# Patient Record
Sex: Male | Born: 1945 | Race: White | Hispanic: No | Marital: Married | Smoking: Never smoker
Health system: Southern US, Community
[De-identification: ages and names within clinical notes are randomized; demographics above are authoritative.]

## PROBLEM LIST (undated history)

## (undated) DIAGNOSIS — M47816 Spondylosis without myelopathy or radiculopathy, lumbar region: Secondary | ICD-10-CM

## (undated) DIAGNOSIS — R7303 Prediabetes: Secondary | ICD-10-CM

## (undated) DIAGNOSIS — R112 Nausea with vomiting, unspecified: Secondary | ICD-10-CM

## (undated) DIAGNOSIS — M1990 Unspecified osteoarthritis, unspecified site: Secondary | ICD-10-CM

## (undated) DIAGNOSIS — C801 Malignant (primary) neoplasm, unspecified: Secondary | ICD-10-CM

## (undated) DIAGNOSIS — R011 Cardiac murmur, unspecified: Secondary | ICD-10-CM

## (undated) DIAGNOSIS — G459 Transient cerebral ischemic attack, unspecified: Secondary | ICD-10-CM

## (undated) DIAGNOSIS — Z87442 Personal history of urinary calculi: Secondary | ICD-10-CM

## (undated) DIAGNOSIS — I2510 Atherosclerotic heart disease of native coronary artery without angina pectoris: Secondary | ICD-10-CM

## (undated) DIAGNOSIS — I639 Cerebral infarction, unspecified: Secondary | ICD-10-CM

## (undated) DIAGNOSIS — M797 Fibromyalgia: Secondary | ICD-10-CM

## (undated) DIAGNOSIS — G4730 Sleep apnea, unspecified: Secondary | ICD-10-CM

## (undated) DIAGNOSIS — G709 Myoneural disorder, unspecified: Secondary | ICD-10-CM

## (undated) DIAGNOSIS — E039 Hypothyroidism, unspecified: Secondary | ICD-10-CM

## (undated) DIAGNOSIS — J189 Pneumonia, unspecified organism: Secondary | ICD-10-CM

## (undated) DIAGNOSIS — E079 Disorder of thyroid, unspecified: Secondary | ICD-10-CM

## (undated) DIAGNOSIS — E119 Type 2 diabetes mellitus without complications: Secondary | ICD-10-CM

## (undated) DIAGNOSIS — C61 Malignant neoplasm of prostate: Secondary | ICD-10-CM

## (undated) DIAGNOSIS — K859 Acute pancreatitis without necrosis or infection, unspecified: Secondary | ICD-10-CM

## (undated) DIAGNOSIS — J45909 Unspecified asthma, uncomplicated: Secondary | ICD-10-CM

## (undated) DIAGNOSIS — G4700 Insomnia, unspecified: Secondary | ICD-10-CM

## (undated) DIAGNOSIS — D649 Anemia, unspecified: Secondary | ICD-10-CM

## (undated) DIAGNOSIS — Z9889 Other specified postprocedural states: Secondary | ICD-10-CM

## (undated) DIAGNOSIS — K219 Gastro-esophageal reflux disease without esophagitis: Secondary | ICD-10-CM

## (undated) DIAGNOSIS — G43019 Migraine without aura, intractable, without status migrainosus: Secondary | ICD-10-CM

## (undated) DIAGNOSIS — I10 Essential (primary) hypertension: Secondary | ICD-10-CM

## (undated) HISTORY — DX: Spondylosis without myelopathy or radiculopathy, lumbar region: M47.816

## (undated) HISTORY — PX: MOHS SURGERY: SUR867

## (undated) HISTORY — PX: JOINT REPLACEMENT: SHX530

## (undated) HISTORY — DX: Migraine without aura, intractable, without status migrainosus: G43.019

## (undated) HISTORY — PX: FOOT SURGERY: SHX648

## (undated) HISTORY — PX: BACK SURGERY: SHX140

## (undated) HISTORY — PX: CHOLECYSTECTOMY: SHX55

## (undated) HISTORY — DX: Hypothyroidism, unspecified: E03.9

## (undated) HISTORY — DX: Cerebral infarction, unspecified: I63.9

## (undated) HISTORY — PX: OTHER SURGICAL HISTORY: SHX169

## (undated) HISTORY — DX: Insomnia, unspecified: G47.00

---

## 1990-09-26 HISTORY — PX: CHOLECYSTECTOMY: SHX55

## 1997-09-26 HISTORY — PX: FOOT SURGERY: SHX648

## 1999-03-15 ENCOUNTER — Ambulatory Visit (HOSPITAL_COMMUNITY): Admission: RE | Admit: 1999-03-15 | Discharge: 1999-03-15 | Payer: Self-pay | Admitting: Gastroenterology

## 2001-09-25 ENCOUNTER — Ambulatory Visit (HOSPITAL_COMMUNITY): Admission: RE | Admit: 2001-09-25 | Discharge: 2001-09-25 | Payer: Self-pay | Admitting: Family Medicine

## 2001-09-25 ENCOUNTER — Encounter: Payer: Self-pay | Admitting: Family Medicine

## 2003-01-20 ENCOUNTER — Ambulatory Visit (HOSPITAL_COMMUNITY): Admission: RE | Admit: 2003-01-20 | Discharge: 2003-01-20 | Payer: Self-pay | Admitting: Family Medicine

## 2003-01-20 ENCOUNTER — Encounter: Payer: Self-pay | Admitting: Family Medicine

## 2003-01-29 ENCOUNTER — Encounter: Payer: Self-pay | Admitting: Specialist

## 2003-01-31 ENCOUNTER — Inpatient Hospital Stay (HOSPITAL_COMMUNITY)
Admission: RE | Admit: 2003-01-31 | Discharge: 2003-02-05 | Payer: Self-pay | Admitting: Physical Medicine & Rehabilitation

## 2003-02-03 ENCOUNTER — Encounter: Payer: Self-pay | Admitting: Specialist

## 2003-02-18 ENCOUNTER — Encounter: Admission: RE | Admit: 2003-02-18 | Discharge: 2003-03-14 | Payer: Self-pay | Admitting: Specialist

## 2003-07-01 ENCOUNTER — Ambulatory Visit (HOSPITAL_COMMUNITY): Admission: RE | Admit: 2003-07-01 | Discharge: 2003-07-01 | Payer: Self-pay | Admitting: Family Medicine

## 2003-09-27 HISTORY — PX: TOTAL KNEE ARTHROPLASTY: SHX125

## 2004-09-03 ENCOUNTER — Ambulatory Visit (HOSPITAL_COMMUNITY): Admission: RE | Admit: 2004-09-03 | Discharge: 2004-09-03 | Payer: Self-pay | Admitting: Gastroenterology

## 2005-08-08 ENCOUNTER — Encounter: Admission: RE | Admit: 2005-08-08 | Discharge: 2005-08-08 | Payer: Self-pay | Admitting: Rheumatology

## 2005-10-24 ENCOUNTER — Encounter
Admission: RE | Admit: 2005-10-24 | Discharge: 2005-11-24 | Payer: Self-pay | Admitting: Physical Medicine and Rehabilitation

## 2006-01-26 DIAGNOSIS — D229 Melanocytic nevi, unspecified: Secondary | ICD-10-CM

## 2006-01-26 HISTORY — DX: Melanocytic nevi, unspecified: D22.9

## 2006-09-26 HISTORY — PX: PROSTATECTOMY: SHX69

## 2007-08-02 ENCOUNTER — Encounter (INDEPENDENT_AMBULATORY_CARE_PROVIDER_SITE_OTHER): Payer: Self-pay | Admitting: Urology

## 2007-08-02 ENCOUNTER — Inpatient Hospital Stay (HOSPITAL_COMMUNITY): Admission: RE | Admit: 2007-08-02 | Discharge: 2007-08-03 | Payer: Self-pay | Admitting: Urology

## 2010-11-06 ENCOUNTER — Emergency Department (HOSPITAL_COMMUNITY): Payer: BC Managed Care – PPO

## 2010-11-06 ENCOUNTER — Observation Stay (HOSPITAL_COMMUNITY)
Admission: EM | Admit: 2010-11-06 | Discharge: 2010-11-07 | Disposition: A | Discharge status: Home / Self Care | Payer: BC Managed Care – PPO | Attending: Neurology | Admitting: Neurology

## 2010-11-06 DIAGNOSIS — I6509 Occlusion and stenosis of unspecified vertebral artery: Secondary | ICD-10-CM | POA: Insufficient documentation

## 2010-11-06 DIAGNOSIS — G4733 Obstructive sleep apnea (adult) (pediatric): Secondary | ICD-10-CM | POA: Insufficient documentation

## 2010-11-06 DIAGNOSIS — E039 Hypothyroidism, unspecified: Secondary | ICD-10-CM | POA: Insufficient documentation

## 2010-11-06 DIAGNOSIS — R4182 Altered mental status, unspecified: Principal | ICD-10-CM | POA: Insufficient documentation

## 2010-11-06 DIAGNOSIS — R269 Unspecified abnormalities of gait and mobility: Secondary | ICD-10-CM | POA: Insufficient documentation

## 2010-11-06 DIAGNOSIS — I10 Essential (primary) hypertension: Secondary | ICD-10-CM | POA: Insufficient documentation

## 2010-11-06 DIAGNOSIS — K219 Gastro-esophageal reflux disease without esophagitis: Secondary | ICD-10-CM | POA: Insufficient documentation

## 2010-11-06 DIAGNOSIS — IMO0001 Reserved for inherently not codable concepts without codable children: Secondary | ICD-10-CM | POA: Insufficient documentation

## 2010-11-06 DIAGNOSIS — E785 Hyperlipidemia, unspecified: Secondary | ICD-10-CM | POA: Insufficient documentation

## 2010-11-06 DIAGNOSIS — R4789 Other speech disturbances: Secondary | ICD-10-CM | POA: Insufficient documentation

## 2010-11-06 DIAGNOSIS — R29810 Facial weakness: Secondary | ICD-10-CM | POA: Insufficient documentation

## 2010-11-06 DIAGNOSIS — G43909 Migraine, unspecified, not intractable, without status migrainosus: Secondary | ICD-10-CM | POA: Insufficient documentation

## 2010-11-06 LAB — CBC
HCT: 44.6 % (ref 39.0–52.0)
HCT: 45.5 % (ref 39.0–52.0)
Hemoglobin: 16.1 g/dL (ref 13.0–17.0)
Hemoglobin: 16.6 g/dL (ref 13.0–17.0)
MCH: 32.1 pg (ref 26.0–34.0)
MCH: 32.4 pg (ref 26.0–34.0)
MCV: 89 fL (ref 78.0–100.0)
MCV: 88.7 fL (ref 78.0–100.0)
Platelets: 214 10*3/uL (ref 150–400)
RBC: 5.01 MIL/uL (ref 4.22–5.81)
RBC: 5.13 MIL/uL (ref 4.22–5.81)
WBC: 8 10*3/uL (ref 4.0–10.5)

## 2010-11-06 LAB — URINALYSIS, ROUTINE W REFLEX MICROSCOPIC
Bilirubin Urine: NEGATIVE
Hgb urine dipstick: NEGATIVE
Specific Gravity, Urine: 1.005 (ref 1.005–1.030)
Urine Glucose, Fasting: NEGATIVE mg/dL
Urobilinogen, UA: 0.2 mg/dL (ref 0.0–1.0)

## 2010-11-06 LAB — COMPREHENSIVE METABOLIC PANEL
ALT: 69 U/L — ABNORMAL HIGH (ref 0–53)
AST: 43 U/L — ABNORMAL HIGH (ref 0–37)
Albumin: 4 g/dL (ref 3.5–5.2)
CO2: 31 mEq/L (ref 19–32)
Chloride: 104 mEq/L (ref 96–112)
GFR calc Af Amer: >60 mL/min (ref >60)
GFR calc non Af Amer: >60 mL/min (ref >60)
Sodium: 143 mEq/L (ref 135–145)
Total Bilirubin: 1 mg/dL (ref 0.3–1.2)

## 2010-11-06 LAB — DIFFERENTIAL
Eosinophils Absolute: 0.2 10*3/uL (ref 0.0–0.7)
Lymphocytes Relative: 40 % (ref 12–46)
Lymphs Abs: 2.9 10*3/uL (ref 0.7–4.0)
Monocytes Relative: 6 % (ref 3–12)
Neutrophils Relative %: 51 % (ref 43–77)

## 2010-11-06 LAB — BASIC METABOLIC PANEL
BUN: 13 mg/dL (ref 6–23)
CO2: 30 mEq/L (ref 19–32)
Chloride: 104 mEq/L (ref 96–112)
Creatinine, Ser: 1.07 mg/dL (ref 0.4–1.5)
Glucose, Bld: 117 mg/dL — ABNORMAL HIGH (ref 70–99)

## 2010-11-06 LAB — HEMOGLOBIN A1C
Hgb A1c MFr Bld: 5.8 % — ABNORMAL HIGH (ref <5.7)
Mean Plasma Glucose: 120 mg/dL — ABNORMAL HIGH (ref <117)

## 2010-11-06 LAB — CK TOTAL AND CKMB (NOT AT ARMC): CK, MB: 1 ng/mL (ref 0.3–4.0)

## 2010-11-06 MED ORDER — GADOBENATE DIMEGLUMINE 529 MG/ML IV SOLN
18.0000 mL | Freq: Once | INTRAVENOUS | Status: AC
  Administered 2010-11-06: 18 mL via INTRAVENOUS

## 2010-11-07 LAB — LIPID PANEL
HDL: 29 mg/dL — ABNORMAL LOW (ref >39)
LDL Cholesterol: 141 mg/dL — ABNORMAL HIGH (ref 0–99)
Triglycerides: 251 mg/dL — ABNORMAL HIGH (ref <150)

## 2010-11-09 NOTE — Discharge Summary (Signed)
Joel Kim                ACCOUNT NO.:  [id number]  MEDICAL RECORD NO.:  [id number]           PATIENT TYPE:  I  LOCATION:  3039                         FACILITY:  [facility]  PHYSICIAN:  Marlan Palau, M.D.  DATE OF BIRTH:  Apr 10, 1946  DATE OF ADMISSION:  11/06/2010 DATE OF DISCHARGE:                              DISCHARGE SUMMARY   ADMISSION DIAGNOSES: 1. New onset of confusion, slurred speech, gait ataxia, rule out     transient ischemic attack. 2. History of frequent headaches. 3. Obstructive sleep apnea on continuous positive airway pressure. 4. Hypertension.  DISCHARGE DIAGNOSES: 1. Possible transient ischemic attack versus migraine event. 2. History of headache. 3. Obstructive sleep apnea. 4. Hypertension. 5. Fibromyalgia. 6. Dyslipidemia.  PROCEDURE DURING THIS ADMISSION: 1. CT scan of the head. 2. MRI of the brain. 3. MR angiogram of the head. 4. MRI angiogram of the neck.  COMPLICATIONS ABOVE PROCEDURES:  None.  HISTORY OF PRESENT ILLNESS:  Joel Kim is a 65 year old right-handed white male born Oct 25, 1945, with a history of headaches followed through [facility].  The patient has had some significant problems with obstructive sleep apneas on CPAP.  The patient had been noting some increase in headache over the last week or two prior to admission claiming that the headaches are mainly on the right side and go down the right neck.  The patient had an event on the morning of admission.  The patient was noted by his wife to have some clumsiness, had difficulty pouring his coffee, slurred speech, right facial droop, gait instability.  This lasted about 20 minutes, then seemed to resolve.  The patient was taken to the emergency room and a CT scan of the brain was done and it was unremarkable.  ABCD squared score was 4.  The patient was admitted for observation and workup for possible TIA event.  PAST MEDICAL HISTORY: 1.  Possible TIA. 2. Obstructive sleep apnea on CPAP. 3. Vertigo. 4. History of headache. 5. Hypertension. 6. Fibromyalgia. 7. Gallbladder resection. 8. 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 9. Left foot surgery. 10.Right knee surgery. 11.Depression. 12.Dyslipidemia. 13.Gastroesophageal reflux disease. 14.Obesity.  MEDICATIONS PRIOR TO ADMISSION:  AndroGel daily application, aspirin 325 mg daily, Zyrtec 10 mg daily, gabapentin 300 mg twice daily, levothyroxine 25 mcg daily, Lovaza 2 capsules twice daily, Micardis 80/25 mg tablet one daily, multivitamins one daily, Nexium 40 mg daily, nortriptyline 50 mg at night, oxycodone 5 mg 1 tablet if needed every 6 hours, OxyContin 20 mg three times daily, potassium 10 mEq daily, Tekturna 300 mg daily, vitamin D 1 tablet daily, Ambien 1 tablet at bedtime if needed for sleep.  ALLERGIES:  The patient has allergies to TAPE ADHESIVES, STATIN DRUGS, which causes muscle and joint pain, PAXIL causes abnormal behavior, and WELLBUTRIN causes abnormal behavior.  SOCIAL HISTORY:  The patient does not currently smoke or drink.  Please refer to history and physical dictation summary for social history, family history, review of systems, physical examination.  Laboratory values during this admission include white count of 8.0, hemoglobin of 16.6, hematocrit of 45.5, MCV of 88.7,  platelets of 219. Sodium 143, potassium 4.3, chloride of 104, CO2 of 31, glucose of 115, BUN of 14, creatinine 1.12, alkaline phosphatase of 93, SGOT of 43, SGPT of 69, total protein 7.4, albumin of 4.0, calcium 10.2, hemoglobin A1c 5.8, CK of 62, troponin-I 0.01.  Urinalysis reveals specific gravity of 1.005, pH of 8.0, cholesterol is 220, triglycerides of 251, HDL of 29, LDL of 141, VLDL of 50.  HOSPITAL COURSE:  The patient was admitted to the hospital for evaluation of a possible TIA event with gait ataxia, slurred speech, right facial droop.  The patient underwent CT  scan of the brain initially that was unremarkable.  The patient was set up for an MRI of the brain and MRA of the head and neck.  This was done.  MRI of the brain shows no acute stroke with mild small vessel chronic changes seen. MRA of the head shows some intracranial atherosclerotic changes involving branch vessels and MRA of the neck was relatively unremarkable.  The patient was kept on aspirin during this admission. Cholesterol panel indicated an elevation in LDL but the patient is not tolerance of the statin drugs.  At this time, the patient is to be discharged to home and will have an outpatient 2-D echocardiogram and an EEG study.  EEG will be done because the patient himself does not recall events that occurred around the time of the episode.  It is possible that this may be related to headache.  The patient will be placed on Toprol XL 50 mg 1 tablet daily as the patient continues to have elevations in blood pressure with diastolic blood pressures in the 90- 100 range and elevations in heart rate that may be related to the use of the nortriptyline.  Heart rates run around the 100 range.  The discharge medications, otherwise, be unchanged and will be as listed previously.  The patient will follow up through [facility] within 4 to 6 weeks but again the outpatient studies will be arranged.  At the time of discharge, the patient is bright, alert, cooperative, at baseline mental status.  No weakness, facial asymmetry, some slurred speech is noted.     Marlan Palau, M.D.     CKW/MEDQ  D:  11/07/2010  T:  11/08/2010  Job:  [id number]  cc:   Ernestina Penna, M.D. [facility]  Electronically Signed by Thana Farr M.D. on 11/09/2010 09:59:36 AM

## 2010-11-09 NOTE — H&P (Signed)
NAMEPESACH, FRISCH NO.:  [id number]  MEDICAL RECORD NO.:  [id number]           PATIENT TYPE:  I  LOCATION:  3039                         FACILITY:  [facility]  PHYSICIAN:  Marlan Palau, M.D.  DATE OF BIRTH:  10-30-1945  DATE OF ADMISSION:  11/06/2010 DATE OF DISCHARGE:                             HISTORY & PHYSICAL   HISTORY OF PRESENT ILLNESS:  Joel Kim is a 65 year old right-handed white male, born on 1946/03/28, with a history of headaches followed through [facility].  The patient has obstructive sleep apnea.  He is on CPAP.  The patient had an event this morning that was associated with an event of clumsiness, had difficulty pouring his coffee, slurred speech, right facial droop, gait instability.  This has been lasted about 20 minutes and then resolved. The patient is back to baseline, but came to the emergency room for evaluation.  CT scan of the brain was done and was unremarkable.  The patient has an ABCD squared score of 4.  He is being admitted for observation and rapid workup for a probable TIA event.  PAST MEDICAL HISTORY:  Significant for: 1. Possible TIA. 2. Obstructive sleep apnea, on CPAP. 3. Vertigo. 4. History of headache.  The patient noted increase in right     frontotemporal headache 1 week prior to coming in the hospital. 5. Hypertension. 6. Fibromyalgia. 7. Gallbladder resection. 8. Cancer of the prostate, status post TURP. 9. Left foot surgery. 10.Right knee surgery. 11.Depression. 12.Dyslipidemia. 13.Gastroesophageal reflux disease. 14.Obesity.  MEDICATIONS:  Prior to coming in include: 1. Micardis 80/25 mg tablet 1 tablet daily. 2. Ambien 10 mg daily. 3. Zyrtec 10 mg daily. 4. Potassium 10 mEq daily. 5. OxyContin 10 mg 3 times daily with 20 mg of OxyContin 3 times     daily. 6. Nexium 40 mg daily. 7. Enteric-coated aspirin 81 mg daily. 8. Fish oil 1000 units 4 times daily. 9. Vitamin D  1000 five times daily. 10.Multivitamins one daily. 11.AndroGel daily. 12.Synthroid 0.025 mg daily. 13.Pamelor 50 mg at night. 14.Lasix 20 mg daily. 15.Neurontin 300 mg twice daily. 16.Tekturna 300 mg daily.  The patient has no known allergies.  He does not currently smoke or drink.  SOCIAL HISTORY:  This patient is married.  Lives in the [place] area.  He is retired.  The patient has four children who are alive and well.  FAMILY MEDICAL HISTORY:  The mother died of old age.  She also had dementia and heart attack.  Father died with myocardial infarction.  The patient has three brothers who are alive and well.  No sisters.  REVIEW OF SYSTEMS:  Notable for some feeling of being cold.  No fevers. The patient does have some troubles with headaches on and off, but has had a right frontotemporal headaches and some right neck discomfort for 1-2 weeks prior to coming in.  The patient has had no shortness of breath, chest pains, abdominal pain, nausea, or vomiting.  Denies problems controlling the bowels or bladder.  The patient has not had any actual syncope or seizure events.  PHYSICAL EXAMINATION:  VITALS:  Blood pressure is 117/87, heart rate 88, respiratory rate 16, temperature afebrile. GENERAL:  The patient is a minimally to moderately obese white male who is alert, cooperative at the time examination. HEENT:  Head is atraumatic.  Eyes:  Pupils are equal, round, and react to light. NECK:  Supple.  No carotid bruits noted. RESPIRATORY:  Clear. CARDIOVASCULAR:  Regular rate and rhythm.  No obvious murmurs or rubs noted. EXTREMITIES:  Without significant edema, but trace edema at ankles is noted. NEUROLOGIC:  Cranial nerves as above.  Facial symmetry is present.  The patient has good sensation of the face to pinprick and soft touch bilaterally.  He has good strength of facial muscles and muscle of the head turn and shoulder shrug bilaterally.  Speech is well  enunciated, not aphasic or dysarthric.  Motor test reveals 5/5 strength in all fours.  Good symmetric motor tone is noted throughout.  No drift is seen in arms or legs.  The patient has good finger-nose-finger, heel-to-shin. Gait was not tested.  Pinprick sensation reveals a decrease in pinprick on the left legs.  In the right, symmetric in the arms.  Vibratory sensation is symmetric throughout.  Deep tendon reflexes are depressed but symmetric.  LABORATORY DATA:  Laboratory values notable for white count of 7.2, hemoglobin is 16.1, hematocrit 44.6, MCV of 89.0, platelets of 214. Sodium 142, potassium of 4.2, chloride of 104, CO2 of 30, glucose 117, BUN of 13, creatinine 1.07.  Calcium 9.8.  INR of 0.98.  CT of the head was unremarkable.  IMPRESSION: 1. Possible transient ischemic attack event. 2. Hypertension. 3. Obstructive sleep apnea, on CPAP.  This patient will be admitted for brief evaluation of possible TIA event.  The patient will undergo an MRI of the brain and MRA of the head and neck.  The 2-D echocardiogram will be ordered.  Given the fact that the patient did not recall the event, may consider an EEG study, which could be done as an outpatient.  The patient will be maintained on aspirin at this time.  I will follow this patient while in-house.     Marlan Palau, M.D.     CKW/MEDQ  D:  11/06/2010  T:  11/07/2010  Job:  [id number]  cc:   [facility] Ernestina Penna, M.D.  Electronically Signed by Thana Farr M.D. on 11/09/2010 09:59:28 AM

## 2010-11-11 ENCOUNTER — Encounter: Payer: Self-pay | Admitting: Cardiology

## 2010-11-11 ENCOUNTER — Ambulatory Visit (HOSPITAL_COMMUNITY): Payer: BC Managed Care – PPO | Attending: Internal Medicine

## 2010-11-11 DIAGNOSIS — I059 Rheumatic mitral valve disease, unspecified: Secondary | ICD-10-CM | POA: Insufficient documentation

## 2010-11-11 DIAGNOSIS — E785 Hyperlipidemia, unspecified: Secondary | ICD-10-CM | POA: Insufficient documentation

## 2010-11-11 DIAGNOSIS — G459 Transient cerebral ischemic attack, unspecified: Secondary | ICD-10-CM | POA: Insufficient documentation

## 2010-11-11 DIAGNOSIS — I10 Essential (primary) hypertension: Secondary | ICD-10-CM | POA: Insufficient documentation

## 2010-11-11 DIAGNOSIS — I079 Rheumatic tricuspid valve disease, unspecified: Secondary | ICD-10-CM | POA: Insufficient documentation

## 2011-02-08 NOTE — H&P (Signed)
NAMEKARLON, SCHLAFER NO.:  [id number]   MEDICAL RECORD NO.:  [id number]          PATIENT TYPE:  INP   LOCATION:  NA                           FACILITY:  [facility]   PHYSICIAN:  Heloise Purpura, MD      DATE OF BIRTH:  05/07/1946   DATE OF ADMISSION:  DATE OF DISCHARGE:                              HISTORY & PHYSICAL   CHIEF COMPLAINT:  Prostate cancer.   HISTORY:  Trelon is a 65 year old gentleman with clinical stage T1C  prostate cancer with a PSA of 4.6 and Gleason score of 3 +4 = 7.  His  biopsy was performed by Dr. Ian Bushman on April 16, 2007 demonstrating 70% of  the left side of the prostate to be involved with adenocarcinoma.  He  underwent a CT scan and bone scan under the care of Dr. Ian Bushman which were  both negative for metastatic disease.  After discussion regarding  management options and taking into consideration his history of  hypogonadism and his desire to resume testosterone placement therapy,  the patient elected to proceed with surgical therapy.   PAST MEDICAL HISTORY:  1. Hyperlipidemia.  2. Hypertension.  3. History of arthritis.  4. Bilateral lower extremity neuropathy.  5. History of nephrolithiasis.  6. Asthma.  7. Mild depression.  8. Gastroesophageal reflux disease.   PAST SURGICAL HISTORY:  1. Total knee arthroplasty.  2. Cholecystectomy.  3. Foot surgery.   MEDICATIONS:  1. Micardis.  2. Potassium chloride.  3. Gabapentin.  4. Furosemide.  5. OxyContin.  6. Oxycodone.  7. Zyrtec.  8. Omega 3 fish oil.  9. Vitamin C.  10.Flaxseed oil.  11.Aspirin.  12.Ambien.   ALLERGIES:  NO KNOWN DRUG ALLERGIES.   FAMILY HISTORY:  There is no history of prostate cancer or GU  malignancy.  The patient's father and mother have a history of  hypertension and history of myocardial infarction.  The patient's mother  also has a history of high cholesterol.  The patient's father died at  age 65 secondary to a myocardial infarction.   SOCIAL  HISTORY:  The patient is married and has two children.  He is  disabled due to his neuropathy.  He denies tobacco use.  He drinks  alcohol only occasionally.   REVIEW OF SYSTEMS:  A complete review of systems was performed.  Pertinent positives include a history of night sweats, fatigue, itching,  blurred vision, sinus problems, back pain, indigestion, headaches and  mild depression.   PHYSICAL EXAMINATION:  CONSTITUTIONAL:  Well-nourished, well-developed,  age-appropriate male in no acute distress.  CARDIOVASCULAR:  Regular rate and rhythm without obvious murmurs.  LUNGS:  Clear bilaterally.  ABDOMEN:  Soft and nontender.  DIGITAL RECTAL EXAM:  Normal sphincter tone without rectal masses.  The  prostate is smooth without nodularity or induration.   IMPRESSION:  Clinically localized adenocarcinoma of the prostate.   PLAN:  Jashon will undergo a robotic-assisted laparoscopic radical  prostatectomy and bilateral pelvic lymphadenectomy.  He will then be  admitted to the hospital for routine postoperative care.      Heloise Purpura, MD  Electronically Signed     LB/MEDQ  D:  08/02/2007  T:  08/02/2007  Job:  [id number]

## 2011-02-08 NOTE — Op Note (Signed)
NAMECORNIE, HERRINGTON NO.:  [id number]   MEDICAL RECORD NO.:  [id number]          PATIENT TYPE:  INP   LOCATION:  0005                         FACILITY:  [facility]   PHYSICIAN:  Heloise Purpura, MD      DATE OF BIRTH:  04/30/1946   DATE OF PROCEDURE:  08/02/2007  DATE OF DISCHARGE:                               OPERATIVE REPORT   PREOPERATIVE DIAGNOSIS:  Clinically localized adenocarcinoma of  prostate.   POSTOPERATIVE DIAGNOSIS:  Clinically localized adenocarcinoma of  prostate.   PROCEDURES:  1. Robotic-assisted laparoscopic radical prostatectomy (bilateral      nerve-sparing).  2. Bilateral laparoscopic pelvic lymphadenectomy.   SURGEON:  Dr. Heloise Purpura.   ASSISTANT:  Dr. Duane Boston.   ANESTHESIA:  General.   COMPLICATIONS:  None.   ESTIMATED BLOOD LOSS:  200 mL.   SPECIMENS:  1. Prostate seminal vesicles.  2. Right pelvic lymph nodes.  3. Left pelvic lymph nodes.   DISPOSITION:  Specimens to pathology.   DRAINS:  1. A 20-French coude catheter.  2. A 19-Blake pelvic drain.   INDICATION FOR PROCEDURE:  Joshue is a 65 year old gentleman with  recently diagnosed clinical stage T1c prostate cancer with a PSA of 4.6  and Gleason score of 3 + 4 = 7.  After discussion regarding management  options for treatment, the patient elected to proceed with the above  procedures.  The potential risks, complications and alternative options  were discussed in detail with the patient and informed consent was  obtained.   DESCRIPTION OF PROCEDURE:  The patient was taken to the operating room  and general anesthetic was administered.  He was given preoperative  antibiotics and placed in the dorsal lithotomy position and prepped and  draped in the usual sterile fashion.  Next, a preoperative time-out was  performed.  A Foley catheter was inserted into the bladder.  A 22-French  catheter could not be placed into the bladder with ease, and therefore,  a 20-French  coude catheter was inserted and did pass without difficulty.  A site was selected just left of the umbilicus and a 12-mm port was  placed, utilizing a standard open Hassan technique which allowed entry  into the peritoneal cavity under direct vision.  Once the port was  placed, a pneumoperitoneum was established and a 0-degree lens was used  to inspect the abdomen.  There was no evidence of any intra-abdominal  injuries or other abnormalities.  The remaining ports were then placed.  Bilateral 8-mm robotic ports were placed approximately 10 cm lateral to  and just inferior to the camera port site.  An additional 8-mm port site  was placed in the far left lateral abdominal wall.  A 5-mm port was  placed between the camera port and the right robotic port, and an  additional left 12 mm port was placed in the far right lateral abdominal  wall for laparoscopic assistance.  All ports were placed under direct  vision without difficulty.  The surgical cart was then docked.  With the  aid of the cautery scissors, the bladder was reflected  posteriorly,  allowing entry into space of Retzius and identification of the  endopelvic fascia and prostate.  The endopelvic fascia was incised with  the apex back to the base of prostate bilaterally and the underlying  levator muscle fibers were swept laterally off the prostate.  This  isolated the dorsal venous complex which was then stapled and divided  with a 45-mm Flex-ETS stapler.  The bladder neck was identified with the  aid of Foley catheter manipulation and the anterior bladder neck was  divided, thereby exposing the catheter.  The catheter balloon was  deflated.  The catheter was brought into the operative field and used to  retract the prostate anteriorly.  The posterior bladder neck was then  divided and dissection continued between the bladder and prostate until  the vasa deferentia and seminal vesicles were identified.  The vasa  deferentia were  divided and lifted anteriorly.  The seminal vesicles  were then dissected down to their tips with care to control the seminal  vesicle arterial blood supply.  The seminal vesicles were then lifted  anteriorly in the space between Denonvilliers fascia and the anterior  rectum was bluntly developed.  This isolated the vascular pedicles of  the prostate.  The lateral prostatic fascia was then incised bilaterally  and the neurovascular bundles were swept posteriorly and laterally off  the prostate.  The vascular pedicles were then divided with Hem-o-lok  clips above the level of the neurovascular bundles with Hem-o-lok clips  and divided with sharp cold scissor dissection.  The neurovascular  bundles were then swept off the apex of prostate and the urethra was  sharply divided, allowing the specimen to be disarticulated.  The pelvis  was then copiously irrigated and hemostasis was ensured.  With  irrigation in the pelvis, air was injected into the rectal catheter and  there was no evidence of a rectal injury.  Attention was then turned to  the right pelvic sidewall.  The fibrofatty tissue between the external  iliac vein, confluence of the iliac vessels, hypogastric artery, and  Cooper's ligament was dissected free from the pelvic sidewall with care  to preserve the obturator nerve.  Hem-o-lok clips were used for  lymphostasis and hemostasis.  The specimen was then passed off for  permanent pathologic analysis and an identical procedure was then  performed on the contralateral side.  Attention was then turned to the  urethral anastomosis.  A 2-0 Vicryl slip-knot was placed between the  bladder neck and Denonvilliers fascia and the posterior urethral tissue  to reapproximate these structures.  A double-armed 3-0 Monocryl suture  was then used to perform a 360-degree, running, tension-free anastomosis  between the bladder neck and urethra.  A new 20-French coude catheter  was then inserted into  the bladder and irrigated.  There were no blood  clots within the bladder and the anastomosis appeared to be watertight.  A 19-Blake drain was brought through the left robotic port and  appropriately positioned in the pelvis.  It was secured to the skin with  a nylon suture.  The surgical cart was then undocked.  The right lateral  12-mm port site was closed with a 0-Vicryl suture placed with the aid of  the suture passer device.  All remaining ports were then removed under  direct vision.  The prostate was removed intact within the Endopouch  retrieval bag and the fascial opening at this site was closed with a  running 0-Vicryl suture.  All port  sites were injected with 0.25%  Marcaine and reapproximate at the skin level with staples.  Sterile  dressings were applied.  The patient appeared to tolerate the procedure  well without complications.  He was able to be extubated and transferred  to the recovery unit in satisfactory condition.      Heloise Purpura, MD  Electronically Signed     LB/MEDQ  D:  08/02/2007  T:  08/02/2007  Job:  [id number]

## 2011-02-11 NOTE — Op Note (Signed)
NAME:  Joel Kim, Joel Kim                          ACCOUNT NO.:  [id number]   MEDICAL RECORD NO.:  [id number]                   PATIENT TYPE:  INP   LOCATION:  5039                                 FACILITY:  [facility]   PHYSICIAN:  Erasmo Leventhal, M.D.         DATE OF BIRTH:  1945/12/10   DATE OF PROCEDURE:  01/31/2003  DATE OF DISCHARGE:                                 OPERATIVE REPORT   PREOPERATIVE DIAGNOSIS:  Right knee end-stage osteoarthritis.   POSTOPERATIVE DIAGNOSIS:  Right knee end-stage osteoarthritis.   PROCEDURE:  Right total knee arthroplasty.   SURGEON:  Erasmo Leventhal, M.D.   ASSISTANT:  Jaquelyn Bitter. Chabon, P.A.   ANESTHESIA:  General with femoral nerve block.   ESTIMATED BLOOD LOSS:  Less than 100 mL.  Drains; two Hemovac.   DISPOSITION:  To PACU stable.   IMPLANTS:  Osteonics components.  Size 7 femur, size 7 tibia, 12 mm flexed  tibial insert, posterior stabilized.  26 mm polyethylene patella.  All  components cemented.   DESCRIPTION OF PROCEDURE:  This patient was counseled in the holding area,  informed consent was signed and is in the chart, was taken to the operating  room and given general anesthesia.  IV Ancef was given 1 gram.  The right  knee was examined with 5 degrees of flexion contracture and flexed to 120  degrees.  Foley catheter was placed utilizing sterile technique by the OR  circulating nurse.  Right lower extremity was elevated, prepped with  Duraprep and Vi-draped in  a sterile fashion.  It is also of note that the  other extremities and parts of body were well padded during the procedure.   The right lower extremity was elevated and exsanguinated with Esmarch. The  tourniquet was inflated to 350 mmHg.   Straight midline incision was made through the skin and subcutaneous tissue  and small veins were coagulated.  Had a small medial scar from beforehand  and this was kept in mind.  Medialized soft tissue flaps were developed.  Medial peripatellar arthrotomy was performed. The patella was everted and  knee was flexed.  End-stage arthritic change bone against bone.  Cruciate  ligaments were resected.  Starting hole was made in the distal in the femur.  Canal was irrigated to efflux.  Medullary rod was gently placed.  After  venting the entire canal.  Made a 10 mm cut off the distal femur with 5  degrees valgus alignment. This was done.  The femur was found to be a size  7, rotational marks were made, and distal femur was cut to fit a size #7.  The lateral menisci were removed and vessels were coagulated.  Tibial  endplates were resected and proximal tibia was found to be a size #7.  Starting hole was made.  The intermedullary canal was irrigated until the  effluent was clear and intermedullary rod was gently placed after the  canal  had been vented.  I chose to take a 2 mm cut based upon the lateral side  which was a nondefect side, at a 5 degree posterior slope.  Alignments were  made. The proximal tibial was cut.  Posterior medial posterior femoral  osteophytes were noted under direct visualization. Femoral trochlea was  prepared in a standard fashion. At this point in time a size 7 femur, a size  7 tibia, and 10 mm insert with excellent range of motion and soft tissue  balance.  Alignment was excellent.  Rotational marks were made on the  proximal tibia and delta keel performed in a standard fashion. The patella  was found to be a size 26 and reamed to appropriate depth of 10 mm, locking  holes were made, and excess bone was removed.  The patella was plenty thick  for this 10 mm recession.  Utilizing pulsatile lavage, the knee was then  copiously irrigated.  Vessels were coagulated.  Utilizing moderate cement  technique, all components were cemented in place.  Size 7 tibia, size 7  femur, and 26 mm patella.  After cement had cured with a trial of 10 and 12  mm thickness, with a 10 mm thickness flexed posterior  stabilized tibial  insert, we had 2 to 3 degrees of reservatum, we could flex well passed 120  degrees.  Patella tracking was done and did not require collateral release.  The knee was well balanced in flexion and extension.  At this point in time,  the tibial trial was removed, excess cement was removed. All bone wax was  placed on exposed bony surfaces and the tibia appeared to be completely dry.  A 12 mm tibial insertion was placed.  At this point in time, the knee had  excellent range of motion and soft tissue balance in flexion and extension.  Patella femoral tracking was anatomic.  Bone wax was placed on exposed bony  surfaces.  Two medium Hemovac drains were placed.   Each layer was irrigated with antibiotic solution on closure.  Sequential  closure of layers were done.  Arthrotomy Vicryl, quadriceps tendon two  layers of Vicryl, subcu Vicryl, skin clips, and Monocryl suture.  Steri-  Strips were applied and sterile compressive dressings.  After confirming  anesthesia, another 30 mL of 0.25% Marcaine with epinephrine placed in the  knee joint to obtain hemostasis.  A sterile compressive dressing was applied  and tourniquet deflated.  Had normal circulation in the foot and ankle at  the end of the case.  At this time a femoral nerve block was given.  Also  after the tourniquet had been deflated, another gram of Ancef was given  intravenously.  Needle, sponge, and instrument count correct. There were no  complications.  The patient was taken to the recovery room in stable  condition.   Decreased surgical time and femoral contraction, decision made throughout  the entire case, Jaquelyn Bitter. Chabon, P.A.'s assistance was needed.                                               Erasmo Leventhal, M.D.    RAC/MEDQ  D:  01/31/2003  T:  02/03/2003  Job:  [id number]

## 2011-02-11 NOTE — Discharge Summary (Signed)
Joel Kim, Joel Kim NO.:  [id number]   MEDICAL RECORD NO.:  [id number]          PATIENT TYPE:  INP   LOCATION:  1442                         FACILITY:  [facility]   PHYSICIAN:  Heloise Purpura, MD      DATE OF BIRTH:  30-Aug-1946   DATE OF ADMISSION:  08/02/2007  DATE OF DISCHARGE:  08/03/2007                               DISCHARGE SUMMARY   ADMISSION DIAGNOSIS:  Prostate cancer.   DISCHARGE DIAGNOSIS:  Prostate cancer.   HISTORY AND PHYSICAL:  For full details, please see admission history  and physical.  Briefly, [name] is a 65 year old gentleman with clinically  localized prostate cancer.  After discussion regarding management  options, he elected to proceed with surgical therapy.   HOSPITAL COURSE:  On August 02, 2007, the patient was taken to the  operating room and underwent a robotic-assisted laparoscopic radical  prostatectomy and bilateral pelvic lymphadenectomy.  He tolerated this  procedure well and without complications.  Postoperatively, he was able  to be transferred to a regular hospital room following recovery from  anesthesia.  He was able to begin ambulating the night of surgery and  remained hemodynamically stable after surgery.  His hematocrit was  checked and found to be stable at 34.4, on postoperative day #1.  He  maintained excellent urine output with minimal output from his pelvic  drain, and his pelvic drain was removed on postoperative day #1.  He was  then begun on a clear liquid diet, which he tolerated without  difficulty.  He was able to be transitioned to oral pain medication and  was subsequently discharged home in excellent condition.   DISPOSITION:  Home.   DISCHARGE MEDICATIONS:  The patient was instructed to resume his regular  home medications excepting any aspirin, nonsteroidal anti-inflammatory  drugs, or herbal supplements.  He was given a prescription to take Cipro  1 day prior to his return visit.  As far as pain  management, due to his  chronic pain situation, he was told to take his p.r.n. oxycodone, as  needed for breakthrough pain.   DISCHARGE INSTRUCTIONS:  He was instructed to be ambulatory but  specifically told to refrain from any heavy lifting, strenuous activity,  or driving.  He was instructed on routine Foley catheter care and told  to gradually advance his diet over the course of the next few days.   FOLLOW UP:  The patient will follow-up in 1 week for review of his  surgical pathology and to remove his Foley catheter.      Heloise Purpura, MD  Electronically Signed     LB/MEDQ  D:  08/04/2007  T:  08/05/2007  Job:  [id number]

## 2011-02-11 NOTE — H&P (Signed)
NAME:  Joel Kim, Joel Kim                          ACCOUNT NO.:  [id number]   MEDICAL RECORD NO.:  [id number]                   PATIENT TYPE:  INP   LOCATION:  NA                                   FACILITY:  [facility]   PHYSICIAN:  Erasmo Leventhal, M.D.         DATE OF BIRTH:  March 05, 1946   DATE OF ADMISSION:  DATE OF DISCHARGE:                                HISTORY & PHYSICAL   CHIEF COMPLAINT:  Osteoarthritis right knee.   HISTORY OF PRESENT ILLNESS:  This is a 65 year old gentleman with a history  of end-stage osteoarthritis in the right knee with two previous operations  who has continued pain and inability for significant periods of time due to  pain and disability in his right knee.  After a discussion of treatment  options and medical clearance by his medical doctor, Dr. Rudi Heap, the  patient is scheduled for a total knee replacement on the right knee.  The  surgery, risks, benefits, and after care were discussed in detail with the  patient.  The patient is also a chronic pain patient and sees Dr. Vear Clock  for management of that and will be contacting Dr. Vear Clock to help manage  his chronic pain and pain management from his acute surgery postoperatively.  As well as Dr. Christell Constant does not come to [facility], we will also obtain a  medical consult with [facility] hospitalist for follow up of his medical  condition postoperatively.  Surgery is to go ahead as scheduled.   ALLERGIES:  None.   CURRENT MEDICATIONS:  1. Micardis 40 mg daily.  2. Norvasc 2.5 mg daily.  3. Vioxx 25 mg daily.  4. Neurontin 300 mg q.a.m. and 900 mg q.p.m.  5. Crestor 2.5 mg daily.  6. Nexium 40 mg daily.  7. Ambien 10 mg q.h.s.  8. Zyrtec 10 mg daily.  9. AndroGel 50 mg gel applied to the skin daily.  10.      Duragesic patch 50 mcg changed q.3 days.  11.      Oxycodone 5 mg q.6h. p.r.n. pain.   PAST SURGICAL HISTORY:  1. Cholecystectomy.  2. Knee arthroscopy x2.   SERIOUS MEDICAL  ILLNESSES:  1. Asthma.  2. Hypertension.  3. Chronic pain.  4. Hypercholesterolemia.   FAMILY HISTORY:  Positive for coronary artery disease, hypertension, CVA,  and arthritis.   REVIEW OF SYSTEMS:  CENTRAL NERVOUS SYSTEM: Positive for history of migraine  headaches.  PULMONARY:  Positive for history of asthma though he has not had  any attacks in years.  He does take a nasal spray for that.  CARDIOVASCULAR:  Positive for hypertension.  Negative for chest pains or palpitations.  GASTROINTESTINAL:  Positive for GERD.  GENITOURINARY:  Positive for a  history of kidney stones.   PHYSICAL EXAMINATION:  GENERAL:  This is a well-developed well-nourished  gentleman in no acute distress.  VITAL SIGNS:  Blood pressure 150/100,  respirations 18, pulse 78 and regular.  HEENT:  Head normocephalic.  Nose patent, ears patent. Pupils equal, round,  and reactive to light.  Throat without injection.  NECK:  Supple without adenopathy.  Carotids 2+ without bruit.  CHEST:  Clear to auscultation.  No rales or rhonchi.  Respirations 18.  HEART:  Regular rate and rhythm with 78 beats/minute without murmur.  ABDOMEN:  Soft with active bowel sounds.  No masses or organomegaly.  GENITOURINARY:  Not applicable to this admission.  NEUROLOGIC:  Patient alert and oriented to time, place, and person.  Cranial  nerves II-XII grossly intact.  EXTREMITIES:  Show the right knee with a varus deformity.  He has 3 degree  reflexion contraction with varus flexion to 130 degrees.  He has crepitation  throughout the range of motion and pain to medial and lateral joint line  palpation.  Dorsalis pedis and posterior tibialis pulses are 2+ and  symmetrical.  SKIN:  Within normal limits.   LABORATORY DATA:  X-rays show end stage osteoarthritis of the right knee.   IMPRESSION:  End-stage osteoarthritis of the right knee.   PLAN:  Total knee replacement right knee.         Jaquelyn Bitter. Chabon, P.A.                    Erasmo Leventhal, M.D.    SJC/MEDQ  D:  01/29/2003  T:  01/30/2003  Job:  [id number]

## 2011-02-11 NOTE — Op Note (Signed)
Joel Kim, Joel Kim                ACCOUNT NO.:  [id number]   MEDICAL RECORD NO.:  [id number]          PATIENT TYPE:  AMB   LOCATION:  ENDO                         FACILITY:  [facility]   PHYSICIAN:  Petra Kuba, M.D.    DATE OF BIRTH:  05-27-1946   DATE OF PROCEDURE:  09/03/2004  DATE OF DISCHARGE:                                 OPERATIVE REPORT   PROCEDURE:  Colonoscopy.   INDICATIONS FOR PROCEDURE:  Patient with history of colon polyps, due for  repeat screening.  Consent was signed after risks, benefits, methods, and  options were thoroughly discussed in the office on multiple occasions.   MEDICATIONS USED:  Demerol 100, Versed 10, and Phenergan 12.5.   PROCEDURE:  Rectal inspection was pertinent for small external hemorrhoids.  Digital exam was negative.  The video pediatric adjustable colonoscope was  inserted and with abdominal pressure, fairly easily advanced around the  colon to the cecum.  No abnormality was seen on insertion.  The cecum was  identified by the appendiceal orifice and the ileocecal valve.  The scope  was inserted a short ways in the terminal ileum which was normal.  Photodocumentation was obtained.  The scope was slowly withdrawn.  The prep  on the right side was only fair, there was stool adherent to the wall which  could not be washed out, but no underlying lesions were seen.  The rest of  the prep from about the hepatic flexure mid transverse was adequate.  On  slow withdrawal back to the rectum, no polypoid lesions or masses were seen.  He did have a rare left sided diverticula.  Once back in the rectum,  anorectal pull through and retroflexion confirmed some small hemorrhoids.  The scope was straightened and readvanced a short ways up the left side of  the colon, air was suctioned, the scope was removed.  The patient tolerated  the procedure well.  There was no obvious immediate complications.   ENDOSCOPIC DIAGNOSIS:  1.  Internal and external  hemorrhoids.  2.  Rare left sided tiny diverticulum.  3.  Otherwise, within normal limits to the terminal ileum.   PLAN:  Yearly rectals and guaiacs per Dr. Christell Constant, be happy to see back  p.r.n., otherwise repeat screening in five years, may want to use a slight  increased prep, like three Fleets bottles to get a better cleansing.       MEM/MEDQ  D:  09/03/2004  T:  09/03/2004  Job:  [id number]   cc:   Ernestina Penna, M.D.  [address] [place]  [place] [address]  Fax: [phone]

## 2011-02-11 NOTE — Discharge Summary (Signed)
NAME:  KYSEAN, SWEET                          ACCOUNT NO.:  [id number]   MEDICAL RECORD NO.:  [id number]                   PATIENT TYPE:  INP   LOCATION:  5039                                 FACILITY:  [facility]   PHYSICIAN:  Erasmo Leventhal, M.D.         DATE OF BIRTH:  08-22-1946   DATE OF ADMISSION:  01/31/2003  DATE OF DISCHARGE:  02/05/2003                                 DISCHARGE SUMMARY   ADMISSION DIAGNOSIS:  Osteoarthritis right knee.   DISCHARGE DIAGNOSIS:  Osteoarthritis right knee.   OPERATION:  Total knee replacement right knee.   HISTORY OF PRESENT ILLNESS:  This is a 65 year old gentleman with end-stage  osteoarthritis right knee with two previous arthroscopies with continued  pain and inability to ambulate significantly due to pain in his knee.  After  discussion of treatment options and clearance by his medical doctor, Dr.  Christell Constant, the patient was scheduled for total knee replacement.  The patient  also has a history of chronic pain and sees Dr. Vear Clock for pain management  and we will be contacting Dr. Vear Clock for help in managing his chronic pain  and pain from his acute surgical episode.  Medical consult with [facility] will be obtained as Dr. Christell Constant does not come to [facility].   LABORATORY DATA:  Admission CBC within normal limits.  He dropped his  hemoglobin and hematocrit to 14.2 and 40.1 on postoperative day #3 and then  by discharge it was back up to 15.4 and 45.4.  PT and PTT within normal  limits on admission.  PT is 21.2 with an INR of 2.1 at discharge on his  Coumadin.  Admission BMET within normal limits.  It remained within normal  limits with the exception of occasionally high glucose during the admission.   HOSPITAL COURSE:  The patient tolerated the operative procedure well.  On  postoperative day #1, vital signs were stable with a temperature to a  maximum of 100.3.  He had some nausea and vomiting the  previous evening but  that abated.  He was started on CPM machine and drain was pulled on his  first postoperative day.   The second postoperative day he still had intermittent nausea especially  with motion and a temperature to a maximum of 106 but was down to 99.2 by  the end of the day.  His dressing was changed.  His wound was benign.  Lungs  were clear.  No calf tenderness.  No cords, negative Homan's sign.  Hemoglobin and hematocrit remained stable.   On postoperative day #3, there was less nausea.  He was feeling better.  O2  saturations were good.  Temperature remained with low grade temperature at  99.2.  There was no chest pain, no shortness of breath, lungs were clear to  auscultation.  There was mild tenderness in the right calf with a negative  Homan's sign.  Dressings were changed and wounds were benign.  Due to his  mild tenderness in the calves and slightly lower O2 saturations, Dopplers  were obtained which were negative.  Chest x-ray was also obtained, which was  negative.  The patient subsequently had his PCA discontinued and was placed  on p.o. medications and discharge planning for Wednesday was initiated.   The fourth postoperative day, the patient was afebrile, neurovascular status  was intact in the leg.  His calf tenderness was abating.  There were no  cords, negative Homan's sign.  No swelling.  The wound was benign.  He had  good range of motion.   On the fifth postoperative day he had improved condition, being afebrile.  Wound benign, no calf tenderness, no cords and negative Homan's sign.  There  was 0 to 90 degree range of motion.  The patient was subsequently discharged  home to follow up in the office.   CONDITION ON DISCHARGE:  Improved.   DISCHARGE MEDICATIONS:  The patient is to resume his home pain management  per Dr. Vear Clock consisting of Duragesic patch and OxyContin.  Coumadin per  pharmacy protocol.   DISCHARGE INSTRUCTIONS:  He is  instructed to use his knee immobilizer when  up.  Use CPM and do his exercises as instructed.  Call the office and return  for a recheck in 10 days or call us sooner p.r.n. problems.     Jaquelyn Bitter. Chabon, P.A.                   Erasmo Leventhal, M.D.    SJC/MEDQ  D:  02/21/2003  T:  02/21/2003  Job:  [id number]

## 2011-07-05 LAB — BASIC METABOLIC PANEL
BUN: 12
CO2: 29
Calcium: 9.2
Chloride: 105
Creatinine, Ser: 1.07
GFR calc Af Amer: >60
Glucose, Bld: 95

## 2011-07-05 LAB — HEMOGLOBIN AND HEMATOCRIT, BLOOD
HCT: 34.4 — ABNORMAL LOW
Hemoglobin: 12.2 — ABNORMAL LOW
Hemoglobin: 13.5

## 2011-07-05 LAB — CBC
MCHC: 34.9
MCV: 91.3
Platelets: 237
RDW: 12.7

## 2011-07-05 LAB — TYPE AND SCREEN
ABO/RH(D): A NEG
Antibody Screen: NEGATIVE

## 2011-09-27 HISTORY — PX: BACK SURGERY: SHX140

## 2012-03-16 ENCOUNTER — Other Ambulatory Visit: Payer: Self-pay | Admitting: Physician Assistant

## 2012-12-24 ENCOUNTER — Other Ambulatory Visit: Payer: Self-pay

## 2012-12-24 MED ORDER — ZALEPLON 10 MG PO CAPS: 10.0000 mg | ORAL_CAPSULE | Freq: Every day | ORAL | Status: DC

## 2012-12-27 ENCOUNTER — Telehealth: Payer: Self-pay | Admitting: Family Medicine

## 2012-12-27 ENCOUNTER — Other Ambulatory Visit: Payer: Self-pay | Admitting: *Deleted

## 2012-12-27 MED ORDER — OXYCODONE HCL ER 20 MG PO T12A: 20.0000 mg | EXTENDED_RELEASE_TABLET | Freq: Three times a day (TID) | ORAL | Status: DC | PRN

## 2012-12-27 MED ORDER — OXYCODONE-ACETAMINOPHEN 5-325 MG PO TABS: 1.0000 | ORAL_TABLET | Freq: Three times a day (TID) | ORAL | Status: DC | PRN

## 2012-12-27 NOTE — Telephone Encounter (Signed)
BOTH WERE LAST FILLED 10/30/12

## 2013-01-21 ENCOUNTER — Other Ambulatory Visit: Payer: Self-pay | Admitting: *Deleted

## 2013-01-21 MED ORDER — PANTOPRAZOLE SODIUM 40 MG PO TBEC: 40.0000 mg | DELAYED_RELEASE_TABLET | Freq: Every day | ORAL | Status: AC

## 2013-02-07 ENCOUNTER — Other Ambulatory Visit: Payer: Self-pay

## 2013-02-07 MED ORDER — TESTOSTERONE 20.25 MG/1.25GM (1.62%) TD GEL: 2.0000 | Freq: Every day | TRANSDERMAL | Status: DC

## 2013-02-07 NOTE — Telephone Encounter (Signed)
RX called into pharmacy. Pt notified.

## 2013-02-07 NOTE — Telephone Encounter (Signed)
you may refill this with 2 additional refills

## 2013-02-07 NOTE — Telephone Encounter (Signed)
Last seen 12/03/12   Call in and have notify patient

## 2013-02-07 NOTE — Telephone Encounter (Signed)
rx printed and patient to pick up

## 2013-02-25 ENCOUNTER — Other Ambulatory Visit: Payer: Self-pay

## 2013-02-25 MED ORDER — GABAPENTIN 600 MG PO TABS: 600.0000 mg | ORAL_TABLET | Freq: Two times a day (BID) | ORAL | Status: DC

## 2013-04-02 ENCOUNTER — Other Ambulatory Visit: Payer: Self-pay | Admitting: *Deleted

## 2013-04-02 MED ORDER — METOPROLOL SUCCINATE ER 50 MG PO TB24: 50.0000 mg | ORAL_TABLET | Freq: Every day | ORAL | Status: DC

## 2013-06-04 ENCOUNTER — Other Ambulatory Visit: Payer: Self-pay

## 2013-06-04 MED ORDER — ZALEPLON 10 MG PO CAPS: 10.0000 mg | ORAL_CAPSULE | Freq: Every day | ORAL | Status: DC

## 2013-06-04 NOTE — Telephone Encounter (Signed)
Rx signed and faxed.

## 2013-08-05 ENCOUNTER — Other Ambulatory Visit: Payer: Self-pay | Admitting: Neurology

## 2013-08-05 MED ORDER — ZALEPLON 10 MG PO CAPS: 10.0000 mg | ORAL_CAPSULE | Freq: Every day | ORAL | Status: DC

## 2014-04-14 ENCOUNTER — Emergency Department (HOSPITAL_COMMUNITY): Payer: BC Managed Care – PPO

## 2014-04-14 ENCOUNTER — Encounter (HOSPITAL_COMMUNITY): Payer: Self-pay | Admitting: Emergency Medicine

## 2014-04-14 ENCOUNTER — Emergency Department (HOSPITAL_COMMUNITY)
Admission: EM | Admit: 2014-04-14 | Discharge: 2014-04-14 | Disposition: A | Discharge status: Home / Self Care | Payer: BC Managed Care – PPO | Attending: Emergency Medicine | Admitting: Emergency Medicine

## 2014-04-14 DIAGNOSIS — J159 Unspecified bacterial pneumonia: Secondary | ICD-10-CM | POA: Insufficient documentation

## 2014-04-14 DIAGNOSIS — Z79899 Other long term (current) drug therapy: Secondary | ICD-10-CM | POA: Insufficient documentation

## 2014-04-14 DIAGNOSIS — I10 Essential (primary) hypertension: Secondary | ICD-10-CM | POA: Insufficient documentation

## 2014-04-14 DIAGNOSIS — Z8673 Personal history of transient ischemic attack (TIA), and cerebral infarction without residual deficits: Secondary | ICD-10-CM | POA: Insufficient documentation

## 2014-04-14 DIAGNOSIS — J45901 Unspecified asthma with (acute) exacerbation: Secondary | ICD-10-CM | POA: Insufficient documentation

## 2014-04-14 DIAGNOSIS — Z7982 Long term (current) use of aspirin: Secondary | ICD-10-CM | POA: Insufficient documentation

## 2014-04-14 DIAGNOSIS — E079 Disorder of thyroid, unspecified: Secondary | ICD-10-CM | POA: Insufficient documentation

## 2014-04-14 DIAGNOSIS — J189 Pneumonia, unspecified organism: Secondary | ICD-10-CM

## 2014-04-14 HISTORY — DX: Transient cerebral ischemic attack, unspecified: G45.9

## 2014-04-14 HISTORY — DX: Disorder of thyroid, unspecified: E07.9

## 2014-04-14 HISTORY — DX: Essential (primary) hypertension: I10

## 2014-04-14 HISTORY — DX: Unspecified asthma, uncomplicated: J45.909

## 2014-04-14 LAB — CBC WITH DIFFERENTIAL/PLATELET
BASOS ABS: 0 10*3/uL (ref 0.0–0.1)
BASOS PCT: 0 % (ref 0–1)
EOS ABS: 0.1 10*3/uL (ref 0.0–0.7)
EOS PCT: 2 % (ref 0–5)
HCT: 40.2 % (ref 39.0–52.0)
Hemoglobin: 14 g/dL (ref 13.0–17.0)
LYMPHS PCT: 27 % (ref 12–46)
Lymphs Abs: 2 10*3/uL (ref 0.7–4.0)
MCH: 31.5 pg (ref 26.0–34.0)
MCHC: 34.8 g/dL (ref 30.0–36.0)
MCV: 90.5 fL (ref 78.0–100.0)
Monocytes Absolute: 0.5 10*3/uL (ref 0.1–1.0)
Monocytes Relative: 7 % (ref 3–12)
Neutro Abs: 4.7 10*3/uL (ref 1.7–7.7)
Neutrophils Relative %: 64 % (ref 43–77)
PLATELETS: 177 10*3/uL (ref 150–400)
RBC: 4.44 MIL/uL (ref 4.22–5.81)
RDW: 13.7 % (ref 11.5–15.5)
WBC: 7.4 10*3/uL (ref 4.0–10.5)

## 2014-04-14 LAB — BASIC METABOLIC PANEL
ANION GAP: 12 (ref 5–15)
BUN: 11 mg/dL (ref 6–23)
CALCIUM: 8.8 mg/dL (ref 8.4–10.5)
CO2: 26 meq/L (ref 19–32)
Chloride: 100 mEq/L (ref 96–112)
Creatinine, Ser: 1.02 mg/dL (ref 0.50–1.35)
GFR, EST AFRICAN AMERICAN: 86 mL/min — AB (ref >90)
GFR, EST NON AFRICAN AMERICAN: 74 mL/min — AB (ref >90)
Glucose, Bld: 116 mg/dL — ABNORMAL HIGH (ref 70–99)
Potassium: 3.7 mEq/L (ref 3.7–5.3)
SODIUM: 138 meq/L (ref 137–147)

## 2014-04-14 MED ORDER — BENZONATATE 100 MG PO CAPS: 100.0000 mg | ORAL_CAPSULE | Freq: Three times a day (TID) | ORAL | Status: DC

## 2014-04-14 MED ORDER — SODIUM CHLORIDE 0.9 % IV BOLUS (SEPSIS)
1000.0000 mL | Freq: Once | INTRAVENOUS | Status: AC
  Administered 2014-04-14: 1000 mL via INTRAVENOUS

## 2014-04-14 MED ORDER — HYDROCOD POLST-CHLORPHEN POLST 10-8 MG/5ML PO LQCR: 5.0000 mL | Freq: Two times a day (BID) | ORAL | Status: DC

## 2014-04-14 MED ORDER — LEVOFLOXACIN 750 MG PO TABS
750.0000 mg | ORAL_TABLET | Freq: Once | ORAL | Status: AC
  Administered 2014-04-14: 750 mg via ORAL
  Filled 2014-04-14: qty 1

## 2014-04-14 MED ORDER — HYDROCODONE-ACETAMINOPHEN 7.5-325 MG/15ML PO SOLN
15.0000 mL | Freq: Once | ORAL | Status: AC
  Administered 2014-04-14: 15 mL via ORAL
  Filled 2014-04-14: qty 15

## 2014-04-14 MED ORDER — ACETAMINOPHEN 325 MG PO TABS
650.0000 mg | ORAL_TABLET | Freq: Once | ORAL | Status: AC
  Administered 2014-04-14: 650 mg via ORAL
  Filled 2014-04-14: qty 2

## 2014-04-14 MED ORDER — LEVOFLOXACIN 500 MG PO TABS: 500.0000 mg | ORAL_TABLET | Freq: Every day | ORAL | Status: DC

## 2014-04-14 MED ORDER — SODIUM CHLORIDE 0.9 % IV SOLN
Freq: Once | INTRAVENOUS | Status: AC
  Administered 2014-04-14: 08:00:00 via INTRAVENOUS

## 2014-04-14 MED ORDER — ALBUTEROL SULFATE HFA 108 (90 BASE) MCG/ACT IN AERS: 1.0000 | INHALATION_SPRAY | Freq: Four times a day (QID) | RESPIRATORY_TRACT | Status: DC | PRN

## 2014-04-14 MED ORDER — ALBUTEROL SULFATE (2.5 MG/3ML) 0.083% IN NEBU
2.5000 mg | INHALATION_SOLUTION | RESPIRATORY_TRACT | Status: DC | PRN
  Administered 2014-04-14: 2.5 mg via RESPIRATORY_TRACT
  Filled 2014-04-14 (×2): qty 3

## 2014-04-14 NOTE — ED Notes (Signed)
EDP in to re eval

## 2014-04-14 NOTE — Discharge Instructions (Signed)
Your Oxygen level is borderline, and Dr. Jeneen Rinks' preference would be to put you in the hospital. Rest.  You will not tolerate activity until the pneumonia resolves. Re check in 48-72 hours with your Doctor. Return to ER with any worsening.  Pneumonia, Adult Pneumonia is an infection of the lungs. It may be caused by a germ (virus or bacteria). Some types of pneumonia can spread easily from person to person. This can happen when you cough or sneeze. HOME CARE  Only take medicine as told by your doctor.  Take your medicine (antibiotics) as told. Finish it even if you start to feel better.  Do not smoke.  You may use a vaporizer or humidifier in your room. This can help loosen thick spit (mucus).  Sleep so you are almost sitting up (semi-upright). This helps reduce coughing.  Rest. A shot (vaccine) can help prevent pneumonia. Shots are often advised for:  People over 70 years old.  Patients on chemotherapy.  People with long-term (chronic) lung problems.  People with immune system problems. GET HELP RIGHT AWAY IF:   You are getting worse.  You cannot control your cough, and you are losing sleep.  You cough up blood.  Your pain gets worse, even with medicine.  You have a fever.  Any of your problems are getting worse, not better.  You have shortness of breath or chest pain. MAKE SURE YOU:   Understand these instructions.  Will watch your condition.  Will get help right away if you are not doing well or get worse. Document Released: 02/29/2008 Document Revised: 12/05/2011 Document Reviewed: 12/03/2010 [facility] Patient Information 2015 [place], [place]. This information is not intended to replace advice given to you by your health care provider. Make sure you discuss any questions you have with your health care provider.

## 2014-04-14 NOTE — ED Provider Notes (Signed)
CSN: [id number]     Arrival date & time 04/14/14  6433 History   First MD Initiated Contact with Patient 04/14/14 0645     Chief Complaint  Patient presents with  . Cough  . Fever      HPI  Patient presents with a four-day illness. Started on Thursday with cough. His wife states that he is coughing to the point is unable to sleep at night. Fever starting yesterday. Shakes and chills. No frank rigors. Cough is productive of thin yellow sputum. No blood. No GI symptoms. No pain in his chest or back. Some subcostal pain with coughing only. Asthma as a child. Does not use medications as an adult.  Past Medical History  Diagnosis Date  . Hypertension   . Asthma   . Thyroid disease   . TIA (transient ischemic attack)    Past Surgical History  Procedure Laterality Date  . Back surgery     No family history on file. History  Substance Use Topics  . Smoking status: Never Smoker   . Smokeless tobacco: Not on file  . Alcohol Use: No    Review of Systems  Constitutional: Positive for fever and fatigue. Negative for chills, diaphoresis and appetite change.  HENT: Negative for mouth sores, sore throat and trouble swallowing.   Eyes: Negative for visual disturbance.  Respiratory: Positive for cough and shortness of breath. Negative for chest tightness and wheezing.   Cardiovascular: Negative for chest pain.  Gastrointestinal: Negative for nausea, vomiting, abdominal pain, diarrhea and abdominal distention.  Endocrine: Negative for polydipsia, polyphagia and polyuria.  Genitourinary: Negative for dysuria, frequency and hematuria.  Musculoskeletal: Negative for gait problem.  Skin: Negative for color change, pallor and rash.  Neurological: Negative for dizziness, syncope, light-headedness and headaches.  Hematological: Does not bruise/bleed easily.  Psychiatric/Behavioral: Negative for behavioral problems and confusion.      Allergies  Adhesive  Home Medications   Prior to  Admission medications   Medication Sig Start Date End Date Taking? Authorizing Provider  amLODipine (NORVASC) 5 MG tablet Take 5 mg by mouth daily.   Yes Historical Provider, MD  aspirin 325 MG tablet Take 325 mg by mouth at bedtime.    Yes Historical Provider, MD  cetirizine (ZYRTEC) 10 MG tablet Take 10 mg by mouth at bedtime.    Yes Historical Provider, MD  Cholecalciferol (VITAMIN D3) 1000 UNITS CAPS Take 1,000 Units by mouth daily.    Yes Historical Provider, MD  gabapentin (NEURONTIN) 300 MG capsule Take 300 mg by mouth 2 (two) times daily.   Yes Historical Provider, MD  hydrochlorothiazide (HYDRODIURIL) 25 MG tablet Take 25 mg by mouth daily.   Yes Historical Provider, MD  levothyroxine (SYNTHROID, LEVOTHROID) 75 MCG tablet Take 75 mcg by mouth daily before breakfast.   Yes Historical Provider, MD  losartan (COZAAR) 100 MG tablet Take 100 mg by mouth daily.   Yes Historical Provider, MD  metoprolol succinate (TOPROL-XL) 50 MG 24 hr tablet Take 50 mg by mouth at bedtime. Take with or immediately following a meal. 04/02/13  Yes Chipper Herb, MD  mirabegron ER (MYRBETRIQ) 50 MG TB24 tablet Take 50 mg by mouth daily. 01/27/14  Yes Historical Provider, MD  Multiple Vitamins-Minerals (MULTIVITAMIN WITH MINERALS) tablet Take 1 tablet by mouth daily.   Yes Historical Provider, MD  nortriptyline (PAMELOR) 25 MG capsule Take 25 mg by mouth at bedtime.   Yes Historical Provider, MD  Omega-3 Fatty Acids (FISH OIL ULTRA) 1000 MG CAPS  Take 1,000 mg by mouth 2 (two) times daily.    Yes Historical Provider, MD  OxyCODONE (OXYCONTIN) 20 mg T12A Take 1 tablet (20 mg total) by mouth 3 (three) times daily as needed. 12/27/12  Yes Chipper Herb, MD  oxyCODONE-acetaminophen (ROXICET) 5-325 MG per tablet Take 1 tablet by mouth every 8 (eight) hours as needed for pain. 12/27/12  Yes Chipper Herb, MD  pantoprazole (PROTONIX) 40 MG tablet Take 1 tablet (40 mg total) by mouth daily. 01/21/13  Yes Chipper Herb, MD   potassium chloride (K-DUR) 10 MEQ tablet Take 10 mEq by mouth daily.   Yes Historical Provider, MD  tadalafil (CIALIS) 5 MG tablet Take 5 mg by mouth daily.   Yes Historical Provider, MD  Testosterone (ANDROGEL) 20.25 MG/1.25GM (1.62%) GEL Place 2 Squirts onto the skin daily. 02/07/13  Yes Chipper Herb, MD  zaleplon (SONATA) 10 MG capsule Take 1 capsule (10 mg total) by mouth at bedtime. If needed 08/05/13  Yes Kathrynn Ducking, MD  albuterol (PROVENTIL HFA;VENTOLIN HFA) 108 (90 BASE) MCG/ACT inhaler Inhale 1-2 puffs into the lungs every 6 (six) hours as needed for wheezing. 04/14/14   Tanna Furry, MD  benzonatate (TESSALON) 100 MG capsule Take 1 capsule (100 mg total) by mouth every 8 (eight) hours. 04/14/14   Tanna Furry, MD  chlorpheniramine-HYDROcodone [facility] PENNKINETIC ER) 10-8 MG/5ML LQCR Take 5 mLs by mouth every 12 (twelve) hours. 04/14/14   Tanna Furry, MD  levofloxacin (LEVAQUIN) 500 MG tablet Take 1 tablet (500 mg total) by mouth daily. 04/14/14   Tanna Furry, MD   BP 117/61  Pulse 78  Temp(Src) 98.4 F (36.9 C) (Oral)  Resp 20  SpO2 92% Physical Exam  Constitutional: He is oriented to person, place, and time. He appears well-developed and well-nourished. No distress.  HENT:  Head: Normocephalic.  Eyes: Conjunctivae are normal. Pupils are equal, round, and reactive to light. No scleral icterus.  Neck: Normal range of motion. Neck supple. No thyromegaly present.  Cardiovascular: Normal rate and regular rhythm.  Exam reveals no gallop and no friction rub.   No murmur heard. Pulmonary/Chest: Effort normal. No respiratory distress. He has wheezes in the right lower field and the left lower field. He has rhonchi in the right lower field and the left lower field.  Abdominal: Soft. Bowel sounds are normal. He exhibits no distension. There is no tenderness. There is no rebound.  Musculoskeletal: Normal range of motion.  Neurological: He is alert and oriented to person, place, and time.   Skin: Skin is warm and dry. No rash noted.  Psychiatric: He has a normal mood and affect. His behavior is normal.    ED Course  Procedures (including critical care time) Labs Review Labs Reviewed  BASIC METABOLIC PANEL - Abnormal; Notable for the following:    Glucose, Bld 116 (*)    GFR calc non Af Amer 74 (*)    GFR calc Af Amer 86 (*)    All other components within normal limits  CBC WITH DIFFERENTIAL    Imaging Review Dg Chest Portable 1 View  04/14/2014   CLINICAL DATA:  Four days of cough and fever  EXAM: PORTABLE CHEST - 1 VIEW  COMPARISON:  PA and lateral chest x-ray of January 08, 2014  FINDINGS: The lungs are adequately inflated. The interstitial markings are more prominent bilaterally than on the previous study. Near confluent densities are present inferiorly, bilaterally and there is partial obscuration of the left hemidiaphragm.  The cardiac silhouette is top-normal in size.  IMPRESSION: Mildly increased interstitial markings bilaterally may reflect interstitial pneumonia. Bibasilar subsegmental atelectasis or alveolar pneumonia is suspected. A followup PA and lateral chest x-ray would be useful.   Electronically Signed   By: David  Martinique   On: 04/14/2014 07:50     EKG Interpretation None      MDM   Final diagnoses:  Community acquired pneumonia    Shows O2 sat ranges from 89-95 at rest. With cough and deep breathe he is in the mid 90s. My preference would be to admit him to the hospital. He politely declines. He will be discharged home. Avoid activity. Recheck with any worsening symptoms. Plan: Ten dat course Levaquin. Tessalon, and Tussionex for cough. Primary care recheck in the next 48-72 hours.    Tanna Furry, MD 04/14/14 1044

## 2014-04-14 NOTE — ED Notes (Signed)
Pt states he feels well enough to go home. Pt made aware he is to sick to go home

## 2014-04-14 NOTE — ED Notes (Signed)
Pt c/o cough and fever since Thursday.

## 2014-10-10 ENCOUNTER — Other Ambulatory Visit: Payer: Self-pay | Admitting: Physician Assistant

## 2014-10-10 DIAGNOSIS — C439 Malignant melanoma of skin, unspecified: Secondary | ICD-10-CM

## 2014-10-10 HISTORY — DX: Malignant melanoma of skin, unspecified: C43.9

## 2014-11-06 ENCOUNTER — Other Ambulatory Visit: Payer: Self-pay | Admitting: Physician Assistant

## 2014-11-20 ENCOUNTER — Other Ambulatory Visit: Payer: Self-pay | Admitting: Physician Assistant

## 2015-08-28 ENCOUNTER — Other Ambulatory Visit: Payer: Self-pay | Admitting: Gastroenterology

## 2015-08-28 DIAGNOSIS — R1310 Dysphagia, unspecified: Secondary | ICD-10-CM

## 2015-08-31 ENCOUNTER — Ambulatory Visit
Admission: RE | Admit: 2015-08-31 | Discharge: 2015-08-31 | Disposition: A | Discharge status: Home / Self Care | Payer: Medicare Other | Source: Ambulatory Visit | Attending: Gastroenterology | Admitting: Gastroenterology

## 2015-08-31 DIAGNOSIS — R1310 Dysphagia, unspecified: Secondary | ICD-10-CM

## 2015-11-04 ENCOUNTER — Other Ambulatory Visit: Payer: Self-pay | Admitting: Gastroenterology

## 2015-11-17 ENCOUNTER — Other Ambulatory Visit: Payer: Self-pay | Admitting: Physician Assistant

## 2015-11-17 DIAGNOSIS — C4491 Basal cell carcinoma of skin, unspecified: Secondary | ICD-10-CM

## 2015-11-17 HISTORY — DX: Basal cell carcinoma of skin, unspecified: C44.91

## 2016-08-10 ENCOUNTER — Other Ambulatory Visit: Payer: Self-pay | Admitting: Physician Assistant

## 2016-09-12 ENCOUNTER — Encounter (HOSPITAL_COMMUNITY): Payer: Self-pay | Admitting: *Deleted

## 2016-09-12 ENCOUNTER — Emergency Department (HOSPITAL_COMMUNITY): Payer: BLUE CROSS/BLUE SHIELD

## 2016-09-12 ENCOUNTER — Emergency Department (HOSPITAL_COMMUNITY)
Admission: EM | Admit: 2016-09-12 | Discharge: 2016-09-12 | Disposition: A | Discharge status: Home / Self Care | Payer: BLUE CROSS/BLUE SHIELD | Attending: Emergency Medicine | Admitting: Emergency Medicine

## 2016-09-12 DIAGNOSIS — W208XXA Other cause of strike by thrown, projected or falling object, initial encounter: Secondary | ICD-10-CM | POA: Diagnosis not present

## 2016-09-12 DIAGNOSIS — S0181XA Laceration without foreign body of other part of head, initial encounter: Secondary | ICD-10-CM | POA: Insufficient documentation

## 2016-09-12 DIAGNOSIS — Y929 Unspecified place or not applicable: Secondary | ICD-10-CM | POA: Diagnosis not present

## 2016-09-12 DIAGNOSIS — S0990XA Unspecified injury of head, initial encounter: Secondary | ICD-10-CM

## 2016-09-12 DIAGNOSIS — Y999 Unspecified external cause status: Secondary | ICD-10-CM | POA: Diagnosis not present

## 2016-09-12 DIAGNOSIS — I10 Essential (primary) hypertension: Secondary | ICD-10-CM | POA: Diagnosis not present

## 2016-09-12 DIAGNOSIS — J45909 Unspecified asthma, uncomplicated: Secondary | ICD-10-CM | POA: Diagnosis not present

## 2016-09-12 DIAGNOSIS — S60511A Abrasion of right hand, initial encounter: Secondary | ICD-10-CM | POA: Insufficient documentation

## 2016-09-12 DIAGNOSIS — Y9389 Activity, other specified: Secondary | ICD-10-CM | POA: Insufficient documentation

## 2016-09-12 HISTORY — DX: Malignant (primary) neoplasm, unspecified: C80.1

## 2016-09-12 NOTE — ED Provider Notes (Signed)
[facility] DEPT Provider Note   CSN: [id number] Arrival date & time: 09/12/16  1418     History   Chief Complaint Chief Complaint  Patient presents with  . Head Injury    HPI BON REASON is a 70 y.o. male.  Patient states that he was cutting a tree down and a limb fell on him in the head he had loss of consciousness for approximately a minute   The history is provided by the patient. No language interpreter was used.  Head Injury   The incident occurred 3 to 5 hours ago. He came to the ER via walk-in. The injury mechanism was a direct blow. He lost consciousness for a period of 1 to 5 minutes. There was no blood loss. The quality of the pain is described as dull. The pain is moderate. The pain has been constant since the injury. Pertinent negatives include no numbness.    Past Medical History:  Diagnosis Date  . Asthma   . Cancer ([place])   . Hypertension   . Thyroid disease   . TIA (transient ischemic attack)     There are no active problems to display for this patient.   Past Surgical History:  Procedure Laterality Date  . BACK SURGERY    . CHOLECYSTECTOMY    . FOOT SURGERY    . JOINT REPLACEMENT         Home Medications    Prior to Admission medications   Medication Sig Start Date End Date Taking? Authorizing Provider  amLODipine (NORVASC) 5 MG tablet Take 5 mg by mouth daily.   Yes Historical Provider, MD  cetirizine (ZYRTEC) 10 MG tablet Take 10 mg by mouth at bedtime.    Yes Historical Provider, MD  Cholecalciferol (VITAMIN D3) 1000 UNITS CAPS Take 1,000 Units by mouth daily.    Yes Historical Provider, MD  gabapentin (NEURONTIN) 300 MG capsule Take 300-600 mg by mouth at bedtime.    Yes Historical Provider, MD  hydrochlorothiazide (HYDRODIURIL) 25 MG tablet Take 25 mg by mouth daily.   Yes Historical Provider, MD  levothyroxine (SYNTHROID, LEVOTHROID) 75 MCG tablet Take 75 mcg by mouth daily before breakfast.   Yes Historical Provider, MD    losartan (COZAAR) 100 MG tablet Take 100 mg by mouth daily.   Yes Historical Provider, MD  metoprolol succinate (TOPROL-XL) 50 MG 24 hr tablet Take 50 mg by mouth at bedtime. Take with or immediately following a meal. 04/02/13  Yes Chipper Herb, MD  nortriptyline (PAMELOR) 25 MG capsule Take 25 mg by mouth at bedtime.   Yes Historical Provider, MD  Omega-3 Fatty Acids (FISH OIL ULTRA) 1000 MG CAPS Take 1,000 mg by mouth at bedtime.    Yes Historical Provider, MD  OxyCODONE (OXYCONTIN) 20 mg T12A Take 1 tablet (20 mg total) by mouth 3 (three) times daily as needed. 12/27/12  Yes Chipper Herb, MD  pantoprazole (PROTONIX) 40 MG tablet Take 1 tablet (40 mg total) by mouth daily. 01/21/13  Yes Chipper Herb, MD  potassium chloride (K-DUR) 10 MEQ tablet Take 10 mEq by mouth daily.   Yes Historical Provider, MD  testosterone cypionate (DEPOTESTOSTERONE CYPIONATE) 200 MG/ML injection Inject into the muscle every 21 ( twenty-one) days. 27mcg   Yes Historical Provider, MD  zaleplon (SONATA) 10 MG capsule Take 1 capsule (10 mg total) by mouth at bedtime. If needed 08/05/13  Yes Kathrynn Ducking, MD  albuterol (PROVENTIL HFA;VENTOLIN HFA) 108 (90 BASE) MCG/ACT inhaler Inhale  1-2 puffs into the lungs every 6 (six) hours as needed for wheezing. 04/14/14   Tanna Furry, MD    Family History No family history on file.  Social History Social History  Substance Use Topics  . Smoking status: Never Smoker  . Smokeless tobacco: Never Used  . Alcohol use No     Allergies   Statins; Adhesive [tape]; Ciprofloxacin; Flagyl [metronidazole]; and Reclast [zoledronic acid]   Review of Systems Review of Systems  Constitutional: Negative for appetite change and fatigue.  HENT: Negative for congestion, ear discharge and sinus pressure.   Eyes: Negative for discharge.  Respiratory: Negative for cough.   Cardiovascular: Negative for chest pain.  Gastrointestinal: Negative for abdominal pain and diarrhea.   Genitourinary: Negative for frequency and hematuria.  Musculoskeletal: Negative for back pain.  Skin: Negative for rash.  Neurological: Positive for headaches. Negative for seizures and numbness.  Psychiatric/Behavioral: Negative for hallucinations.     Physical Exam Updated Vital Signs BP (!) 155/105   Pulse 85   Temp 98.3 F (36.8 C)   Resp 19   Ht 5\' 5"  (1.651 m)   Wt 190 lb (86.2 kg)   SpO2 97%   BMI 31.62 kg/m   Physical Exam  Constitutional: He is oriented to person, place, and time. He appears well-developed.  HENT:  Head: Normocephalic.  Star-shaped laceration to the top of his head  Eyes: Conjunctivae and EOM are normal. No scleral icterus.  Neck: Neck supple. No thyromegaly present.  Cardiovascular: Normal rate and regular rhythm.  Exam reveals no gallop and no friction rub.   No murmur heard. Pulmonary/Chest: No stridor. He has no wheezes. He has no rales. He exhibits no tenderness.  Abdominal: He exhibits no distension. There is no tenderness. There is no rebound.  Musculoskeletal: Normal range of motion.  Abrasion swelling to the back of his right hand.  Lymphadenopathy:    He has no cervical adenopathy.  Neurological: He is oriented to person, place, and time. He exhibits normal muscle tone. Coordination normal.  Skin: No rash noted. No erythema.  Psychiatric: He has a normal mood and affect. His behavior is normal.     ED Treatments / Results  Labs (all labs ordered are listed, but only abnormal results are displayed) Labs Reviewed - No data to display  EKG  EKG Interpretation None       Radiology Ct Head Wo Contrast  Result Date: 09/12/2016 CLINICAL DATA:  Trauma. Hit in head with a tree branch. Loss of consciousness. EXAM: CT HEAD WITHOUT CONTRAST CT CERVICAL SPINE WITHOUT CONTRAST TECHNIQUE: Multidetector CT imaging of the head and cervical spine was performed following the standard protocol without intravenous contrast. Multiplanar CT  image reconstructions of the cervical spine were also generated. COMPARISON:  08/13/2015 FINDINGS: CT HEAD FINDINGS Brain: No evidence of acute infarction, hemorrhage, hydrocephalus, extra-axial collection or mass lesion/mass effect. Cavum septum pellucidum and vergae are noted. Vascular: No hyperdense vessel or unexpected calcification. Skull: Normal. Negative for fracture or focal lesion. Sinuses/Orbits: No acute finding. Other: A scalp laceration overlying the vertex of the skull noted. CT CERVICAL SPINE FINDINGS Alignment: Normal. Skull base and vertebrae: No acute fracture. No primary bone lesion or focal pathologic process. Soft tissues and spinal canal: No prevertebral fluid or swelling. No visible canal hematoma. Disc levels:  Negative Upper chest: Negative. Other: None IMPRESSION: 1. No acute intracranial abnormalities. 2. Scout laceration. 3. No evidence for cervical spine fracture. Electronically Signed   By: Queen Slough.D.  On: 09/12/2016 15:33   Ct Cervical Spine Wo Contrast  Result Date: 09/12/2016 CLINICAL DATA:  Trauma. Hit in head with a tree branch. Loss of consciousness. EXAM: CT HEAD WITHOUT CONTRAST CT CERVICAL SPINE WITHOUT CONTRAST TECHNIQUE: Multidetector CT imaging of the head and cervical spine was performed following the standard protocol without intravenous contrast. Multiplanar CT image reconstructions of the cervical spine were also generated. COMPARISON:  08/13/2015 FINDINGS: CT HEAD FINDINGS Brain: No evidence of acute infarction, hemorrhage, hydrocephalus, extra-axial collection or mass lesion/mass effect. Cavum septum pellucidum and vergae are noted. Vascular: No hyperdense vessel or unexpected calcification. Skull: Normal. Negative for fracture or focal lesion. Sinuses/Orbits: No acute finding. Other: A scalp laceration overlying the vertex of the skull noted. CT CERVICAL SPINE FINDINGS Alignment: Normal. Skull base and vertebrae: No acute fracture. No primary bone  lesion or focal pathologic process. Soft tissues and spinal canal: No prevertebral fluid or swelling. No visible canal hematoma. Disc levels:  Negative Upper chest: Negative. Other: None IMPRESSION: 1. No acute intracranial abnormalities. 2. Scout laceration. 3. No evidence for cervical spine fracture. Electronically Signed   By: Kerby Moors M.D.   On: 09/12/2016 15:33   Dg Hand Complete Right  Result Date: 09/12/2016 CLINICAL DATA:  Tree limb fell  and landed on hand.  Abrasions. EXAM: RIGHT HAND - COMPLETE 3+ VIEW COMPARISON:  None. FINDINGS: There is a questionable chip fracture involving the fifth digit, proximal interphalangeal joint dorsally, see oblique view, with soft tissue swelling. Correlate clinically. Similar tiny radiopaque density can be seen in the distal interphalangeal joint of the fourth finger, unknown if this represents degenerative change or acute injury. No metacarpal fracture or digital dislocation is observed. IMPRESSION: Negative. Electronically Signed   By: Staci Righter M.D.   On: 09/12/2016 15:16    Procedures .Marland KitchenLaceration Repair Date/Time: 09/12/2016 4:44 PM Performed by: Milton Ferguson Authorized by: Milton Ferguson   Comments:     Patient is a 3 cm laceration to the top of his head. It was cleaned thoroughly with Betadine. 3 staples were used to close laceration. Patient tolerated the procedure well   (including critical care time)  Medications Ordered in ED Medications - No data to display   Initial Impression / Assessment and Plan / ED Course  I have reviewed the triage vital signs and the nursing notes.  Pertinent labs & imaging results that were available during my care of the patient were reviewed by me and considered in my medical decision making (see chart for details).  Clinical Course     Head injury,    Final Clinical Impressions(s) / ED Diagnoses   Final diagnoses:  Injury of head, initial encounter    New Prescriptions New  Prescriptions   No medications on file     Milton Ferguson, MD 09/12/16 1645

## 2016-09-12 NOTE — Discharge Instructions (Signed)
Clean laceration and abrasion to hand twice a day with soap and water. Take Tylenol for pain. Follow-up in 7-10 days to have staples removed

## 2016-09-12 NOTE — ED Notes (Signed)
C-Collar in place 

## 2016-09-12 NOTE — ED Triage Notes (Signed)
States he was hit in the head with a tree branch, states he lost consciousness for a short period. Abrasion to top of head , laceration to right hand

## 2016-09-20 ENCOUNTER — Encounter (HOSPITAL_COMMUNITY): Payer: Self-pay

## 2016-09-20 ENCOUNTER — Emergency Department (HOSPITAL_COMMUNITY)
Admission: EM | Admit: 2016-09-20 | Discharge: 2016-09-20 | Disposition: A | Discharge status: Home / Self Care | Payer: BLUE CROSS/BLUE SHIELD | Attending: Emergency Medicine | Admitting: Emergency Medicine

## 2016-09-20 DIAGNOSIS — I10 Essential (primary) hypertension: Secondary | ICD-10-CM | POA: Insufficient documentation

## 2016-09-20 DIAGNOSIS — Z4802 Encounter for removal of sutures: Secondary | ICD-10-CM | POA: Diagnosis present

## 2016-09-20 DIAGNOSIS — J45909 Unspecified asthma, uncomplicated: Secondary | ICD-10-CM | POA: Insufficient documentation

## 2016-09-20 DIAGNOSIS — Z859 Personal history of malignant neoplasm, unspecified: Secondary | ICD-10-CM | POA: Insufficient documentation

## 2016-09-20 DIAGNOSIS — Z79899 Other long term (current) drug therapy: Secondary | ICD-10-CM | POA: Insufficient documentation

## 2016-09-20 NOTE — ED Triage Notes (Signed)
Pt here for staple removal from wound in head.  No complaints.

## 2016-09-20 NOTE — Discharge Instructions (Signed)
Follow up as needed

## 2016-09-20 NOTE — ED Provider Notes (Signed)
[facility] DEPT Provider Note   CSN: [id number] Arrival date & time: 09/20/16  [id number]  By signing my name below, I, Gwenlyn Fudge, attest that this documentation has been prepared under the direction and in the presence of Davonna Belling, MD. Electronically Signed: Gwenlyn Fudge, ED Scribe. 09/20/16. 10:01 AM.   History   Chief Complaint Chief Complaint  Patient presents with  . Suture / Staple Removal   The history is provided by the patient. No language interpreter was used.   HPI Comments: Joel Kim is a 70 y.o. male who presents to the Emergency Department for staple removal from the top of his head. Pt has no complaints at this time. Pt sustained a 3 cm laceration to the top of his head when a tree limb that he was cutting down, fell and struck him on the head. 3 staples are in place with no signs of infection.  Past Medical History:  Diagnosis Date  . Asthma   . Cancer ([place])   . Hypertension   . Thyroid disease   . TIA (transient ischemic attack)     There are no active problems to display for this patient.   Past Surgical History:  Procedure Laterality Date  . BACK SURGERY    . CHOLECYSTECTOMY    . FOOT SURGERY    . JOINT REPLACEMENT      Home Medications    Prior to Admission medications   Medication Sig Start Date End Date Taking? Authorizing Provider  albuterol (PROVENTIL HFA;VENTOLIN HFA) 108 (90 BASE) MCG/ACT inhaler Inhale 1-2 puffs into the lungs every 6 (six) hours as needed for wheezing. 04/14/14   Tanna Furry, MD  amLODipine (NORVASC) 5 MG tablet Take 5 mg by mouth daily.    Historical Provider, MD  cetirizine (ZYRTEC) 10 MG tablet Take 10 mg by mouth at bedtime.     Historical Provider, MD  Cholecalciferol (VITAMIN D3) 1000 UNITS CAPS Take 1,000 Units by mouth daily.     Historical Provider, MD  gabapentin (NEURONTIN) 300 MG capsule Take 300-600 mg by mouth at bedtime.     Historical Provider, MD  hydrochlorothiazide (HYDRODIURIL) 25 MG tablet  Take 25 mg by mouth daily.    Historical Provider, MD  levothyroxine (SYNTHROID, LEVOTHROID) 75 MCG tablet Take 75 mcg by mouth daily before breakfast.    Historical Provider, MD  losartan (COZAAR) 100 MG tablet Take 100 mg by mouth daily.    Historical Provider, MD  metoprolol succinate (TOPROL-XL) 50 MG 24 hr tablet Take 50 mg by mouth at bedtime. Take with or immediately following a meal. 04/02/13   Chipper Herb, MD  nortriptyline (PAMELOR) 25 MG capsule Take 25 mg by mouth at bedtime.    Historical Provider, MD  Omega-3 Fatty Acids (FISH OIL ULTRA) 1000 MG CAPS Take 1,000 mg by mouth at bedtime.     Historical Provider, MD  OxyCODONE (OXYCONTIN) 20 mg T12A Take 1 tablet (20 mg total) by mouth 3 (three) times daily as needed. 12/27/12   Chipper Herb, MD  pantoprazole (PROTONIX) 40 MG tablet Take 1 tablet (40 mg total) by mouth daily. 01/21/13   Chipper Herb, MD  potassium chloride (K-DUR) 10 MEQ tablet Take 10 mEq by mouth daily.    Historical Provider, MD  testosterone cypionate (DEPOTESTOSTERONE CYPIONATE) 200 MG/ML injection Inject into the muscle every 21 ( twenty-one) days. 29mcg    Historical Provider, MD  zaleplon (SONATA) 10 MG capsule Take 1 capsule (10 mg total) by  mouth at bedtime. If needed 08/05/13   Kathrynn Ducking, MD    Family History No family history on file.  Social History Social History  Substance Use Topics  . Smoking status: Never Smoker  . Smokeless tobacco: Never Used  . Alcohol use No     Allergies   Statins; Adhesive [tape]; Ciprofloxacin; Flagyl [metronidazole]; and Reclast [zoledronic acid]   Review of Systems Review of Systems  Constitutional: Negative for fever.  Skin: Positive for wound.   Physical Exam Updated Vital Signs BP 141/88 (BP Location: Left Arm)   Pulse 75   Temp 97.8 F (36.6 C) (Oral)   Resp 18   Ht 5\' 5"  (1.651 m)   Wt 190 lb (86.2 kg)   SpO2 95%   BMI 31.62 kg/m   Physical Exam  Constitutional: He is oriented to  person, place, and time. He appears well-developed and well-nourished. He is active. No distress.  HENT:  Head: Normocephalic and atraumatic.  Stellate healing laceration to the vertex of his head Well healing, no erythema, no edema  Pulmonary/Chest: No respiratory distress.  Musculoskeletal:  Healing abrasions to right dorsum of hand.  Neurological: He is alert and oriented to person, place, and time.  Skin: Skin is warm.  Nursing note and vitals reviewed.  ED Treatments / Results  COORDINATION OF CARE: 9:55 AM Discussed treatment plan with pt at bedside which includes staple removal and pt agreed to plan.  Labs (all labs ordered are listed, but only abnormal results are displayed) Labs Reviewed - No data to display  EKG  EKG Interpretation None       Radiology No results found.  Procedures Procedures (including critical care time)  Medications Ordered in ED Medications - No data to display   Initial Impression / Assessment and Plan / ED Course  I have reviewed the triage vital signs and the nursing notes.  Pertinent labs & imaging results that were available during my care of the patient were reviewed by me and considered in my medical decision making (see chart for details).  Clinical Course    I personally performed the services described in this documentation, which was scribed in my presence. The recorded information has been reviewed and is accurate.    Patient presents for suture removal. Well-appearing. Sutures removed. Discharge home.  Final Clinical Impressions(s) / ED Diagnoses   Final diagnoses:  Encounter for staple removal    New Prescriptions New Prescriptions   No medications on file     Davonna Belling, MD 09/20/16 1005

## 2017-03-01 ENCOUNTER — Other Ambulatory Visit: Payer: Self-pay | Admitting: Physician Assistant

## 2017-10-23 ENCOUNTER — Ambulatory Visit: Payer: Medicare HMO | Attending: Neurosurgery | Admitting: Physical Therapy

## 2017-10-23 ENCOUNTER — Other Ambulatory Visit: Payer: Self-pay

## 2017-10-23 DIAGNOSIS — M5442 Lumbago with sciatica, left side: Secondary | ICD-10-CM | POA: Diagnosis present

## 2017-10-23 DIAGNOSIS — G8929 Other chronic pain: Secondary | ICD-10-CM | POA: Insufficient documentation

## 2017-10-23 NOTE — Therapy (Signed)
[facility] [place], [place], [address] Phone: [phone]   Fax:  [phone]  Physical Therapy Evaluation  Patient Details  Name: Joel Kim MRN: [id number] Date of Birth: 04-Jul-1946 Referring Provider: Glenna Fellows MD   Encounter Date: 10/23/2017  PT End of Session - 10/23/17 1414    Visit Number  1    Number of Visits  16    Date for PT Re-Evaluation  12/22/17    PT Start Time  0100    PT Stop Time  0149    PT Time Calculation (min)  49 min    Activity Tolerance  Patient tolerated treatment well    Behavior During Therapy  [facility] for tasks assessed/performed       Past Medical History:  Diagnosis Date  . Asthma   . Cancer ([place])   . Hypertension   . Thyroid disease   . TIA (transient ischemic attack)     Past Surgical History:  Procedure Laterality Date  . BACK SURGERY    . CHOLECYSTECTOMY    . FOOT SURGERY    . JOINT REPLACEMENT      There were no vitals filed for this visit.   Subjective Assessment - 10/23/17 1541    Subjective  The patients to the clinic today with chronic low back pain.  He reports he always has pain but flared it up significnatly after the last snowfall.  His pain-level today at rest is 6/10 but can rise to 10/10 with increased activity.  Rest decreases his pain.  The patient states his doctor suggested he may want to get a TENS unit.    Pertinent History  2 prior low back surgeries.    How long can you stand comfortably?  10-15 minutes.    How long can you walk comfortably?  Short community distances.    Patient Stated Goals  Get my pain down to where it was before.    Currently in Pain?  Yes    Pain Score  5     Pain Location  Back    Pain Orientation  Mid;Left    Pain Descriptors / Indicators  Aching    Pain Type  Chronic pain    Pain Onset  More than a month ago    Pain Frequency  Constant    Aggravating Factors   See above.    Pain Relieving Factors  See above.          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    Referring Provider  Glenna Fellows MD    Onset Date/Surgical Date  -- Ongoing.      Precautions   Precautions  None      Restrictions   Weight Bearing Restrictions  No      Balance Screen   Has the patient fallen in the past 6 months  No    Has the patient had a decrease in activity level because of a fear of falling?   No    Is the patient reluctant to leave their home because of a fear of falling?   No      Home Environment   Living Environment  Private residence      Prior Function   Level of Independence  Independent      Posture/Postural Control   Posture/Postural Control  Postural limitations    Postural Limitations  Rounded Shoulders;Forward head;Decreased lumbar lordosis    Posture Comments  The patient stands in some trunk flexion.      ROM / Strength   AROM / PROM / Strength  AROM;Strength      AROM   Overall AROM Comments  LE ROM WFL (Rt TKA and left foot surgery considered).  Active lumbar flexion limited by 75% and lumbar extension to neutral.      Strength   Overall Strength Comments  Left hip flexion and abduction= 4-/5 and left dorisflexion 4-/5 when contralaterally compared.        Palpation   Palpation comment  Patient c/o pain "in" lower lumbar spine.      Special Tests    Special Tests  -- Absent bilateral LE Pat and Achilles reflexes are absent.      Bed Mobility   Bed Mobility  -- Independent.      Transfers   Transfers  -- Independent.      Ambulation/Gait   Gait Comments  The patient walks in some spinal flexion and appears to be in obvious pain.             Objective measurements completed on examination: See above findings.      [facility] Adult PT Treatment/Exercise - 10/23/17 0001      Modalities   Modalities  Electrical Stimulation;Moist Heat      Moist Heat Therapy   Number Minutes Moist Heat  15  Minutes    Moist Heat Location  Lumbar Spine      Electrical Stimulation   Electrical Stimulation Location  Low back.    Electrical Stimulation Action  IFC    Electrical Stimulation Parameters  80-150 Hz x 15 minutes.    Electrical Stimulation Goals  Pain               PT Short Term Goals - 10/23/17 1625      PT SHORT TERM GOAL #1   Title  STG's=LTG's.        PT Long Term Goals - 10/23/17 1635      PT LONG TERM GOAL #1   Title  Independent with a HEP.    Time  8    Period  Weeks    Status  New      PT LONG TERM GOAL #2   Title  Perform ADL's with pain not > 4/10.    Time  8    Period  Weeks    Status  New      PT LONG TERM GOAL #3   Title  Eliminate left thigh pain.    Time  8    Period  Weeks    Status  New             Plan - 10/23/17 1559    Clinical Impression Statement  The patient presents to OPPT with c/o chronic low back pain that was exaberbated during the recent snowfall.  He had significant losses of spinal range of motion and reports of pain over his left anterior thigh.  He LE DTR's are absent.  His left LE has a mild strength deficit.  His functional mobility is impaired due to deficts and pain.  Patient will benefit from skilled physical therapy intervention.    History and Personal Factors relevant to plan of care:  2 lumbar surgeries; Right THA; left foot surgery.    Clinical Presentation  Evolving    Clinical Presentation due to:  Pain is not subsiding.  Clinical Decision Making  Moderate    Rehab Potential  Fair    PT Frequency  2x / week    PT Duration  8 weeks    PT Treatment/Interventions  ADLs/Self Care Home Management;Cryotherapy;Electrical Stimulation;Ultrasound;Moist Heat;Therapeutic activities;Therapeutic exercise;Patient/family education;Manual techniques    PT Next Visit Plan  Begin core exercise progression.  Modalites and STW/M as needed.    Consulted and Agree with Plan of Care  Patient       Patient will benefit  from skilled therapeutic intervention in order to improve the following deficits and impairments:  Abnormal gait, Decreased activity tolerance, Postural dysfunction, Pain, Decreased strength  Visit Diagnosis: Chronic left-sided low back pain with left-sided sciatica - Plan: PT plan of care cert/re-cert     Problem List There are no active problems to display for this patient.   Saudia Smyser, [name] 10/23/2017, 4:43 PM  [facility] [address] [place], [place], [address] Phone: [phone]   Fax:  [phone]  Name: Joel Kim MRN: [id number] Date of Birth: 11/05/1945

## 2017-10-25 ENCOUNTER — Ambulatory Visit: Payer: Medicare HMO | Admitting: Physical Therapy

## 2017-10-25 DIAGNOSIS — G8929 Other chronic pain: Secondary | ICD-10-CM

## 2017-10-25 DIAGNOSIS — M5442 Lumbago with sciatica, left side: Principal | ICD-10-CM

## 2017-10-25 NOTE — Therapy (Signed)
[facility] [place], [place], [address] Phone: [phone]   Fax:  [phone]  Physical Therapy Treatment  Patient Details  Name: Joel Kim MRN: [id number] Date of Birth: 19-Apr-1946 Referring Provider: Glenna Fellows MD   Encounter Date: 10/25/2017  PT End of Session - 10/25/17 0732    Visit Number  2    Number of Visits  16    Date for PT Re-Evaluation  12/22/17    PT Start Time  0730    PT Stop Time  0825    PT Time Calculation (min)  55 min    Activity Tolerance  Patient tolerated treatment well    Behavior During Therapy  [facility] for tasks assessed/performed       Past Medical History:  Diagnosis Date  . Asthma   . Cancer ([place])   . Hypertension   . Thyroid disease   . TIA (transient ischemic attack)     Past Surgical History:  Procedure Laterality Date  . BACK SURGERY    . CHOLECYSTECTOMY    . FOOT SURGERY    . JOINT REPLACEMENT      There were no vitals filed for this visit.  Subjective Assessment - 10/25/17 0746    Subjective  Patient stated he's "feeling alright." Pain reported 4/10 and throbbing. "pain is my companion."    Pertinent History  2 prior low back surgeries.    Currently in Pain?  Yes    Pain Score  4     Pain Location  Back    Pain Orientation  Left;Lower    Pain Descriptors / Indicators  Throbbing;Aching    Pain Type  Chronic pain    Pain Onset  More than a month ago    Pain Frequency  Constant                      [facility] Adult PT Treatment/Exercise - 10/25/17 0001      Exercises   Exercises  Lumbar      Lumbar Exercises: Aerobic   Nustep  Level 5 x10 min      Lumbar Exercises: Supine   Clam  20 reps with Yellow band    Bridge  20 reps    Other Supine Lumbar Exercises  hip adduction with ball 5" hold, x10      Electrical Stimulation   Electrical Stimulation Location  Left low back    Electrical Stimulation Action  IFC    Electrical Stimulation Parameters   80-150 Hz x15    Electrical Stimulation Goals  Pain      Manual Therapy   Manual Therapy  Soft tissue mobilization    Manual therapy comments  STW/M to L lumbar paraspinals               PT Short Term Goals - 10/23/17 1625      PT SHORT TERM GOAL #1   Title  STG's=LTG's.        PT Long Term Goals - 10/23/17 1635      PT LONG TERM GOAL #1   Title  Independent with a HEP.    Time  8    Period  Weeks    Status  New      PT LONG TERM GOAL #2   Title  Perform ADL's with pain not > 4/10.    Time  8    Period  Weeks    Status  New  PT LONG TERM GOAL #3   Title  Eliminate left thigh pain.    Time  8    Period  Weeks    Status  New            Plan - 10/25/17 0754    Clinical Impression Statement  Patient was able to tolerate treatment with minimal increase of pain. Patient provided multiple rest breaks in betweeen sets and in between exercises to allow pain to subside. Patient reported supine clams with yellow "too easy". Increase resistance to red theraband next visit. Patient had increased tenderness along Left lumbar paraspinals. No adverse affects noted upon removal of E-stim.     Clinical Presentation  Evolving    Clinical Decision Making  Moderate    Rehab Potential  Fair    PT Duration  8 weeks    PT Next Visit Plan  Core exercises, modalities and STW/M PRN    Consulted and Agree with Plan of Care  Patient       Patient will benefit from skilled therapeutic intervention in order to improve the following deficits and impairments:  Abnormal gait, Decreased activity tolerance, Postural dysfunction, Pain, Decreased strength  Visit Diagnosis: Chronic left-sided low back pain with left-sided sciatica     Problem List There are no active problems to display for this patient.  Gabriela Eves, PT, DPT 10/25/2017, 9:09 AM  [facility] [address] [place], [place], [address] Phone: [phone]   Fax:   [phone]  Name: Joel Kim MRN: [id number] Date of Birth: April 12, 1946

## 2017-10-30 ENCOUNTER — Ambulatory Visit: Payer: Medicare HMO | Attending: Neurosurgery | Admitting: Physical Therapy

## 2017-10-30 ENCOUNTER — Encounter: Payer: Self-pay | Admitting: Physical Therapy

## 2017-10-30 DIAGNOSIS — G8929 Other chronic pain: Secondary | ICD-10-CM | POA: Insufficient documentation

## 2017-10-30 DIAGNOSIS — M5442 Lumbago with sciatica, left side: Secondary | ICD-10-CM | POA: Insufficient documentation

## 2017-10-30 NOTE — Patient Instructions (Signed)
Pelvic Tilt: Posterior - Legs Bent (Supine)  Tighten stomach and flatten back by rolling pelvis down. Hold _10___ seconds. Relax. Repeat _10-30___ times per set. Do __2__ sets per session. Do _2___ sessions per day.   Bent Leg Lift (Hook-Lying)  Tighten stomach and slowly raise right leg _5___ inches from floor. Keep trunk rigid. Hold _3___ seconds. Repeat _10___ times per set. Do ___2-3_ sets per session. Do __2__ sessions per day.   Straight Leg Raise  Tighten stomach and slowly raise locked right leg __4__ inches from floor. Repeat __10-30__ times per set. Do __2__ sets per session. Do __2__ sessions per day.  Bridging  Slowly raise buttocks from floor, keeping stomach tight. Repeat _10___ times per set. Do __2__ sets per session. Do __2__ sessions per day.   Brushing Teeth    Place one foot on ledge and one hand on counter. Bend other knee slightly to keep back straight.  Copyright  VHI. All rights reserved.  Refrigerator   Squat with knees apart to reach lower shelves and drawers.   Copyright  VHI. All rights reserved.  Laundry Basket   Squat down and hold basket close to stand. Use leg muscles to do the work.   Copyright  VHI. All rights reserved.  Housework - Vacuuming   Hold the vacuum with arm held at side. Step back and forth to move it, keeping head up. Avoid twisting.   Copyright  VHI. All rights reserved.  Housework - Wiping   Position yourself as close as possible to reach work surface. Avoid straining your back.   Copyright  VHI. All rights reserved.  Gardening - Mowing   Keep arms close to sides and walk with lawn mower.   Copyright  VHI. All rights reserved.  Sleeping on Side   Place pillow between knees. Use cervical support under neck and a roll around waist as needed.   Copyright  VHI. All rights reserved.  Log Roll   Lying on back, bend left knee and place left arm across chest. Roll all in one movement to the  right. Reverse to roll to the left. Always move as one unit.   Copyright  VHI. All rights reserved.  Stand to Sit / Sit to Stand   To sit: Bend knees to lower self onto front edge of chair, then scoot back on seat. To stand: Reverse sequence by placing one foot forward, and scoot to front of seat. Use rocking motion to stand up.  Copyright  VHI. All rights reserved.  Posture - Standing   Good posture is important. Avoid slouching and forward head thrust. Maintain curve in low back and align ears over shoul- ders, hips over ankles.   Copyright  VHI. All rights reserved.  Posture - Sitting   Sit upright, head facing forward. Try using a roll to support lower back. Keep shoulders relaxed, and avoid rounded back. Keep hips level with knees. Avoid crossing legs for long periods.   Copyright  VHI. All rights reserved.  Computer Work   Position work to face forward. Use proper work and seat height. Keep shoulders back and down, wrists straight, and elbows at right angles. Use chair that provides full back support. Add footrest and lumbar roll as needed.   Copyright  VHI. All rights reserved.   

## 2017-10-30 NOTE — Therapy (Signed)
[facility] [place], [place], [address] Phone: [phone]   Fax:  [phone]  Physical Therapy Treatment  Patient Details  Name: Joel Kim MRN: [id number] Date of Birth: Apr 15, 1946 Referring Provider: Glenna Fellows MD   Encounter Date: 10/30/2017  PT End of Session - 10/30/17 0850    Visit Number  3    Number of Visits  16    Date for PT Re-Evaluation  12/22/17    PT Start Time  0825    PT Stop Time  0913    PT Time Calculation (min)  48 min    Activity Tolerance  Patient tolerated treatment well    Behavior During Therapy  [facility] for tasks assessed/performed       Past Medical History:  Diagnosis Date  . Asthma   . Cancer ([place])   . Hypertension   . Thyroid disease   . TIA (transient ischemic attack)     Past Surgical History:  Procedure Laterality Date  . BACK SURGERY    . CHOLECYSTECTOMY    . FOOT SURGERY    . JOINT REPLACEMENT      There were no vitals filed for this visit.  Subjective Assessment - 10/30/17 0829    Subjective  Patient reported ongoing discomfort in back esp after any prolong activity    Pertinent History  2 prior low back surgeries.    How long can you stand comfortably?  10-15 minutes.    How long can you walk comfortably?  Short community distances.    Patient Stated Goals  Get my pain down to where it was before.    Currently in Pain?  Yes    Pain Score  4     Pain Location  Back    Pain Orientation  Left;Right;Lower    Pain Descriptors / Indicators  Discomfort;Aching    Pain Type  Chronic pain    Pain Onset  More than a month ago    Pain Frequency  Constant    Aggravating Factors   any prolong activity    Pain Relieving Factors  at rest                       [facility] Adult PT Treatment/Exercise - 10/30/17 0001      Self-Care   Self-Care  ADL's;Lifting;Posture;Other Self-Care Comments    Other Self-Care Comments   HEP provided      Lumbar Exercises: Aerobic    Nustep  Level 5 x10 min UE/LE activity core focus, monitored      Lumbar Exercises: Standing   Other Standing Lumbar Exercises  pink XTS for scap retraction and lat pull x20 each      Lumbar Exercises: Supine   Ab Set  20 reps;5 seconds    Glut Set  20 reps;5 seconds    Bent Knee Raise  3 seconds 2x10    Bridge  3 seconds;20 reps    Straight Leg Raise  3 seconds 2x10      Moist Heat Therapy   Number Minutes Moist Heat  15 Minutes    Moist Heat Location  Lumbar Spine      Electrical Stimulation   Electrical Stimulation Location  back    Electrical Stimulation Action  IFC    Electrical Stimulation Parameters  80-150hz  x74min    Electrical Stimulation Goals  Pain             PT Education - 10/30/17 2458  Education provided  Yes    Education Details  HEP core activation progression and posture awareness techniques    Person(s) Educated  Patient    Methods  Explanation;Demonstration;Handout    Comprehension  Verbalized understanding;Returned demonstration       PT Short Term Goals - 10/23/17 1625      PT SHORT TERM GOAL #1   Title  STG's=LTG's.        PT Long Term Goals - 10/30/17 0850      PT LONG TERM GOAL #1   Title  Independent with a HEP.    Time  8    Period  Weeks    Status  On-going      PT LONG TERM GOAL #2   Title  Perform ADL's with pain not > 4/10.    Time  8    Period  Weeks    Status  On-going      PT LONG TERM GOAL #3   Title  Eliminate left thigh pain.    Time  8    Period  Weeks    Status  On-going            Plan - 10/30/17 0355    Clinical Impression Statement  Patient tolerated treatment well today. Patient was able to progress with core activation and strengthening today and was educated on posture awareness techniques with HEP provided. Patient has reported ongoing discomfort with any prolong activity and decreased with rest. Patient current goals ongoing due to pain deficts.     Rehab Potential  Fair    PT Frequency   2x / week    PT Duration  8 weeks    PT Treatment/Interventions  ADLs/Self Care Home Management;Cryotherapy;Electrical Stimulation;Ultrasound;Moist Heat;Therapeutic activities;Therapeutic exercise;Patient/family education;Manual techniques    PT Next Visit Plan  Core exercises, modalities and STW/M PRN    Consulted and Agree with Plan of Care  Patient       Patient will benefit from skilled therapeutic intervention in order to improve the following deficits and impairments:  Abnormal gait, Decreased activity tolerance, Postural dysfunction, Pain, Decreased strength  Visit Diagnosis: Chronic left-sided low back pain with left-sided sciatica     Problem List There are no active problems to display for this patient.   Phillips Climes, PTA 10/30/2017, 9:22 AM  [facility] [place], [place], [address] Phone: [phone]   Fax:  [phone]  Name: DAEVON HOLDREN MRN: [id number] Date of Birth: [birth year]

## 2017-11-02 ENCOUNTER — Encounter: Payer: Self-pay | Admitting: Physical Therapy

## 2017-11-02 ENCOUNTER — Ambulatory Visit: Payer: Medicare HMO | Admitting: Physical Therapy

## 2017-11-02 DIAGNOSIS — G8929 Other chronic pain: Secondary | ICD-10-CM

## 2017-11-02 DIAGNOSIS — M5442 Lumbago with sciatica, left side: Principal | ICD-10-CM

## 2017-11-02 NOTE — Therapy (Signed)
[facility] [place], [place], [address] Phone: [phone]   Fax:  [phone]  Physical Therapy Treatment  Patient Details  Name: Joel Kim MRN: [id number] Date of Birth: 1945-11-02 Referring Provider: Glenna Fellows MD   Encounter Date: 11/02/2017  PT End of Session - 11/02/17 0743    Visit Number  4    Number of Visits  16    Date for PT Re-Evaluation  12/22/17    PT Start Time  0736    PT Stop Time  0824    PT Time Calculation (min)  48 min    Activity Tolerance  Patient tolerated treatment well    Behavior During Therapy  [facility] for tasks assessed/performed       Past Medical History:  Diagnosis Date  . Asthma   . Cancer ([place])   . Hypertension   . Thyroid disease   . TIA (transient ischemic attack)     Past Surgical History:  Procedure Laterality Date  . BACK SURGERY    . CHOLECYSTECTOMY    . FOOT SURGERY    . JOINT REPLACEMENT      There were no vitals filed for this visit.  Subjective Assessment - 11/02/17 0736    Subjective  Reports that his back is okay this morning but still goes to bed and wakes up with back pain.    Pertinent History  2 prior low back surgeries.    How long can you stand comfortably?  10-15 minutes.    How long can you walk comfortably?  Short community distances.    Patient Stated Goals  Get my pain down to where it was before.    Currently in Pain?  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    Next MD Visit  12/2017      Precautions   Precautions  None      Restrictions   Weight Bearing Restrictions  No                  [facility] Adult PT Treatment/Exercise - 11/02/17 0001      Lumbar  Exercises: Aerobic   Nustep  Level 5 x13 min UE/LE activity core focus, monitored      Lumbar Exercises: Standing   Row  Strengthening;Both;20 reps;Limitations    Row Limitations  pink XTS    Shoulder Extension  Strengthening;Both;20 reps    Shoulder Extension Limitations  Pink XTS lat pulldown    Other Standing Lumbar Exercises  B lunging with 4# reachout x15 reps each      Lumbar Exercises: Supine   Bent Knee Raise  20 reps;3 seconds    Bridge  20 reps;3 seconds    Straight Leg Raise  20 reps;3 seconds      Modalities   Modalities  Electrical Stimulation;Moist Heat      Moist Heat Therapy   Number Minutes Moist Heat  15 Minutes    Moist Heat Location  Lumbar Spine      Electrical Stimulation   Electrical Stimulation Location  B low back    Electrical Stimulation Action  IFC    Electrical Stimulation Parameters  1-10 hz x15 min    Electrical Stimulation Goals  Pain               PT Short Term Goals - 10/23/17 1625      PT SHORT TERM GOAL #1   Title  STG's=LTG's.        PT Long Term Goals - 10/30/17 0850      PT LONG TERM GOAL #1   Title  Independent with a HEP.    Time  8    Period  Weeks    Status  On-going      PT LONG TERM GOAL #2   Title  Perform ADL's with pain not > 4/10.    Time  8    Period  Weeks    Status  On-going      PT LONG TERM GOAL #3   Title  Eliminate left thigh pain.    Time  8    Period  Weeks    Status  On-going            Plan - 11/02/17 0827    Clinical Impression Statement  Patient tolerated today's treatment well although weakness in LLE noted with lunging and SLR. Patient encouraged throughout treatment for core activation through all exercises. Instability noted in LLE lunging with reachouts. No complaints of any increased pain throughout treatment. Patient encouraged to avoid holding his breath with core activation. Normal modalities response noted following removal of the modalities.    Rehab Potential  Fair     PT Frequency  2x / week    PT Duration  8 weeks    PT Treatment/Interventions  ADLs/Self Care Home Management;Cryotherapy;Electrical Stimulation;Ultrasound;Moist Heat;Therapeutic activities;Therapeutic exercise;Patient/family education;Manual techniques    PT Next Visit Plan  Core exercises, modalities and STW/M PRN    Consulted and Agree with Plan of Care  Patient       Patient will benefit from skilled therapeutic intervention in order to improve the following deficits and impairments:  Abnormal gait, Decreased activity tolerance, Postural dysfunction, Pain, Decreased strength  Visit Diagnosis: Chronic left-sided low back pain with left-sided sciatica     Problem List There are no active problems to display for this patient.   Standley Brooking, PTA 11/02/2017, 8:33 AM  [facility] [address] [place], [place], [address] Phone: [phone]   Fax:  [phone]  Name: Joel Kim MRN: [id number] Date of Birth: 1945/12/24

## 2017-11-06 ENCOUNTER — Ambulatory Visit: Payer: Medicare HMO | Admitting: Physical Therapy

## 2017-11-06 DIAGNOSIS — G8929 Other chronic pain: Secondary | ICD-10-CM

## 2017-11-06 DIAGNOSIS — M5442 Lumbago with sciatica, left side: Principal | ICD-10-CM

## 2017-11-06 NOTE — Therapy (Signed)
[facility] [place], [place], [address] Phone: [phone]   Fax:  [phone]  Physical Therapy Treatment  Patient Details  Name: Joel Kim MRN: [id number] Date of Birth: 04/18/1946 Referring Provider: Glenna Fellows MD   Encounter Date: 11/06/2017  PT End of Session - 11/06/17 0818    Visit Number  5    Number of Visits  16    Date for PT Re-Evaluation  12/22/17    PT Start Time  0816    PT Stop Time  0907    PT Time Calculation (min)  51 min    Activity Tolerance  Patient tolerated treatment well    Behavior During Therapy  [facility] for tasks assessed/performed       Past Medical History:  Diagnosis Date  . Asthma   . Cancer ([place])   . Hypertension   . Thyroid disease   . TIA (transient ischemic attack)     Past Surgical History:  Procedure Laterality Date  . BACK SURGERY    . CHOLECYSTECTOMY    . FOOT SURGERY    . JOINT REPLACEMENT      There were no vitals filed for this visit.  Subjective Assessment - 11/06/17 0853    Subjective  Patient reported feeling sore, "but my back is always sore."    Pertinent History  2 prior low back surgeries.    How long can you stand comfortably?  10-15 minutes.    How long can you walk comfortably?  Short community distances.    Patient Stated Goals  Get my pain down to where it was before.    Currently in Pain?  Yes    Pain Score  4     Pain Location  Back    Pain Orientation  Lower    Pain Descriptors / Indicators  Sore    Pain Type  Chronic pain    Pain Onset  More than a month ago                      [facility] Adult PT Treatment/Exercise - 11/06/17 0001      Lumbar Exercises: Aerobic   Nustep  Level 5 x12 min UE/LE activity core focus, monitored      Lumbar Exercises: Standing   Row  Strengthening;20 reps;Both    Row Limitations  Pink XTS    Other Standing Lumbar Exercises  Forward resisted walking x10 with emphasis on core activatoin      Lumbar Exercises: Supine   Bent Knee Raise  20 reps;3 seconds    Bridge  20 reps      Lumbar Exercises: Sidelying   Clam  Both;20 reps green      Modalities   Modalities  Electrical Stimulation      Moist Heat Therapy   Number Minutes Moist Heat  15 Minutes    Moist Heat Location  Lumbar Spine      Electrical Stimulation   Electrical Stimulation Location  B low back    Electrical Stimulation Action  IFC    Electrical Stimulation Parameters  80-150 hz x15 min    Electrical Stimulation Goals  Pain               PT Short Term Goals - 10/23/17 1625      PT SHORT TERM GOAL #1   Title  STG's=LTG's.        PT Long Term Goals - 10/30/17 8416  PT LONG TERM GOAL #1   Title  Independent with a HEP.    Time  8    Period  Weeks    Status  On-going      PT LONG TERM GOAL #2   Title  Perform ADL's with pain not > 4/10.    Time  8    Period  Weeks    Status  On-going      PT LONG TERM GOAL #3   Title  Eliminate left thigh pain.    Time  8    Period  Weeks    Status  On-going            Plan - 11/06/17 8921    Clinical Impression Statement  Patient was able to tolerate today's treatment. Patient noted with "pulling" during R clams shell up in R low back and QL region. Patient inquired using heating pad at home for pain relief. PT instructed to use no more than 10-15 minutes per day with layers betweeen skin and heat source and to not use any topical analgesics before heat to prevent burns. Patient reported understanding. No adverse affects noted upon removal of modalities.    Clinical Presentation  Evolving    Clinical Decision Making  Moderate    Rehab Potential  Fair    PT Frequency  2x / week    PT Duration  8 weeks    PT Treatment/Interventions  ADLs/Self Care Home Management;Cryotherapy;Electrical Stimulation;Ultrasound;Moist Heat;Therapeutic activities;Therapeutic exercise;Patient/family education;Manual techniques    PT Next Visit Plan  Continue with  POC to improve LBP and return to PLOF. STW/M and modalities PRN for pain relief.    Consulted and Agree with Plan of Care  Patient       Patient will benefit from skilled therapeutic intervention in order to improve the following deficits and impairments:  Abnormal gait, Decreased activity tolerance, Postural dysfunction, Pain, Decreased strength  Visit Diagnosis: Chronic left-sided low back pain with left-sided sciatica     Problem List There are no active problems to display for this patient.  Gabriela Eves, PT, DPT 11/06/2017, 9:15 AM  [facility] [address] [place], [place], [address] Phone: [phone]   Fax:  [phone]  Name: Joel Kim MRN: [id number] Date of Birth: [birth year]

## 2017-11-09 ENCOUNTER — Ambulatory Visit: Payer: Medicare HMO | Admitting: Physical Therapy

## 2017-11-09 DIAGNOSIS — M5442 Lumbago with sciatica, left side: Principal | ICD-10-CM

## 2017-11-09 DIAGNOSIS — G8929 Other chronic pain: Secondary | ICD-10-CM

## 2017-11-09 NOTE — Therapy (Signed)
[facility] [place], [place], [address] Phone: [phone]   Fax:  [phone]  Physical Therapy Treatment  Patient Details  Name: Joel Kim MRN: [id number] Date of Birth: 08-17-1946 Referring Provider: Glenna Fellows MD   Encounter Date: 11/09/2017  PT End of Session - 11/09/17 0859    Visit Number  6    Number of Visits  16    Date for PT Re-Evaluation  12/22/17    PT Start Time  0815    PT Stop Time  0904    PT Time Calculation (min)  49 min    Activity Tolerance  Patient tolerated treatment well    Behavior During Therapy  [facility] for tasks assessed/performed       Past Medical History:  Diagnosis Date  . Asthma   . Cancer ([place])   . Hypertension   . Thyroid disease   . TIA (transient ischemic attack)     Past Surgical History:  Procedure Laterality Date  . BACK SURGERY    . CHOLECYSTECTOMY    . FOOT SURGERY    . JOINT REPLACEMENT      There were no vitals filed for this visit.  Subjective Assessment - 11/09/17 0859    Subjective  Patient reported "feeling good, but I didn't do anything yesterday."    Pertinent History  2 prior low back surgeries.    How long can you stand comfortably?  10-15 minutes.    How long can you walk comfortably?  Short community distances.    Patient Stated Goals  Get my pain down to where it was before.         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    Next MD Visit  12/2017      Precautions   Precautions  None      Restrictions   Weight Bearing Restrictions  No                  [facility] Adult PT Treatment/Exercise - 11/09/17 0001      Lumbar Exercises: Aerobic   Nustep  Level 5 x15 minutes with draw in      Lumbar Exercises: Standing   Row  Strengthening;Both;Theraband;Other (comment) x30    Row Limitations  Pink XTS    Shoulder Extension  Strengthening;Both;Other  (comment) x30    Shoulder Extension Limitations  Pink XTS      Lumbar Exercises: Supine   Bridge with Ball Squeeze  20 reps;2 seconds    Straight Leg Raise  20 reps;3 seconds      Modalities   Modalities  Electrical Stimulation      Moist Heat Therapy   Number Minutes Moist Heat  15 Minutes    Moist Heat Location  Lumbar Spine      Electrical Stimulation   Electrical Stimulation Location  B low back    Electrical Stimulation Action  IFC    Electrical Stimulation Parameters  80-150 hz x15    Electrical Stimulation Goals  Pain               PT Short Term Goals - 10/23/17 1625      PT SHORT TERM GOAL #1   Title  STG's=LTG's.        PT Long Term Goals - 10/30/17 0850      PT LONG TERM GOAL #1   Title  Independent with a HEP.    Time  8    Period  Weeks    Status  On-going      PT LONG TERM GOAL #2   Title  Perform ADL's with pain not > 4/10.    Time  8    Period  Weeks    Status  On-going      PT LONG TERM GOAL #3   Title  Eliminate left thigh pain.    Time  8    Period  Weeks    Status  On-going            Plan - 11/09/17 0865    Clinical Impression Statement  Patient was able to complete exercises with no complaints. Patient stated he felt rows and extensions were a "good workout". Patient instructed to continue working on draw ins throughout the day for core stabilization and strength. Patient in agreement. No adverse affects noted upon removal of modalities.    Clinical Presentation  Evolving    Clinical Decision Making  Moderate    Rehab Potential  Fair    PT Frequency  2x / week    PT Duration  8 weeks    PT Treatment/Interventions  ADLs/Self Care Home Management;Cryotherapy;Electrical Stimulation;Ultrasound;Moist Heat;Therapeutic activities;Therapeutic exercise;Patient/family education;Manual techniques    PT Next Visit Plan  Continue with POC to improve LBP and return to PLOF. STW/M and modalities PRN for pain relief.    Consulted and  Agree with Plan of Care  Patient       Patient will benefit from skilled therapeutic intervention in order to improve the following deficits and impairments:  Abnormal gait, Decreased activity tolerance, Postural dysfunction, Pain, Decreased strength  Visit Diagnosis: Chronic left-sided low back pain with left-sided sciatica     Problem List There are no active problems to display for this patient.  Gabriela Eves, PT, DPT 11/09/2017, 9:09 AM  [facility] [address] [place], [place], [address] Phone: [phone]   Fax:  [phone]  Name: Joel Kim MRN: [id number] Date of Birth: 02/23/1946

## 2017-11-13 ENCOUNTER — Ambulatory Visit: Payer: Medicare HMO | Admitting: Physical Therapy

## 2017-11-16 ENCOUNTER — Encounter: Payer: Self-pay | Admitting: Physical Therapy

## 2017-11-16 ENCOUNTER — Ambulatory Visit: Payer: Medicare HMO | Admitting: Physical Therapy

## 2017-11-16 DIAGNOSIS — G8929 Other chronic pain: Secondary | ICD-10-CM

## 2017-11-16 DIAGNOSIS — M5442 Lumbago with sciatica, left side: Principal | ICD-10-CM

## 2017-11-16 NOTE — Therapy (Signed)
[facility] [address] [place], [place], [address] Phone: [phone]   Fax:  [phone]  Physical Therapy Treatment  Patient Details  Name: Joel Kim MRN: [id number] Date of Birth: 12/22/1945 Referring Provider: Mark Roy MD   Encounter Date: 11/16/2017  PT End of Session - 11/16/17 0820    Visit Number  7    Number of Visits  16    Date for PT Re-Evaluation  12/22/17    PT Start Time  0817    PT Stop Time  0904    PT Time Calculation (min)  47 min    Activity Tolerance  Patient tolerated treatment well    Behavior During Therapy  [facility] for tasks assessed/performed       Past Medical History:  Diagnosis Date  . Asthma   . Cancer ([facility])   . Hypertension   . Thyroid disease   . TIA (transient ischemic attack)     Past Surgical History:  Procedure Laterality Date  . BACK SURGERY    . CHOLECYSTECTOMY    . FOOT SURGERY    . JOINT REPLACEMENT      There were no vitals filed for this visit.  Subjective Assessment - 11/16/17 0819    Subjective  Patient reports that his back feels "pretty good" but still has some pain. Reports rainy days he doesn't have much to do.    Pertinent History  2 prior low back surgeries.    How long can you stand comfortably?  10-15 minutes.    How long can you walk comfortably?  Short community distances.    Patient Stated Goals  Get my pain down to where it was before.    Currently in Pain?  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    Next MD Visit  12/2017      Precautions   Precautions  None      Restrictions   Weight Bearing Restrictions  No                  [facility] Adult PT Treatment/Exercise -  11/16/17 0001      Lumbar Exercises: Aerobic   Stationary Bike  L2 x10 min      Lumbar Exercises: Standing   Row  Strengthening;Both;20 reps    Row Limitations  Pink XTS    Shoulder Extension  Strengthening;Both;20 reps    Shoulder Extension Limitations  Pink XTS      Lumbar Exercises: Supine   Bent Knee Raise  15 reps;2 seconds    Bridge  20 reps;3 seconds    Straight Leg Raise  20 reps;2 seconds      Lumbar Exercises: Sidelying   Hip Abduction  Both;10 reps      Modalities   Modalities  Electrical Stimulation;Moist Heat      Moist Heat Therapy   Number Minutes Moist Heat  15 Minutes    Moist Heat Location  Lumbar Spine      Electrical Stimulation   Electrical Stimulation Location  B low back      Electrical Stimulation Action  IFC    Electrical Stimulation Parameters  80-150 hz x15 min    Electrical Stimulation Goals  Pain               PT Short Term Goals - 10/23/17 1625      PT SHORT TERM GOAL #1   Title  STG's=LTG's.        PT Long Term Goals - 11/16/17 0826      PT LONG TERM GOAL #1   Title  Independent with a HEP.    Time  8    Period  Weeks    Status  On-going      PT LONG TERM GOAL #2   Title  Perform ADL's with pain not > 4/10.    Time  8    Period  Weeks    Status  On-going      PT LONG TERM GOAL #3   Title  Eliminate left thigh pain.    Time  8    Period  Weeks    Status  Partially Met " sometimes" 11/16/2017            Plan - 11/16/17 0914    Clinical Impression Statement  Patient tolerated today's treatment well with complaints of minimal low back discomfort upon arrival. Patient guided through core strengthening in standing and supine. Patient still very weak in SL hip abduction especially in LLE. Normal modalities response noted following removal of the modalities.    Rehab Potential  Fair    PT Frequency  2x / week    PT Duration  8 weeks    PT Treatment/Interventions  ADLs/Self Care Home Management;Cryotherapy;Electrical  Stimulation;Ultrasound;Moist Heat;Therapeutic activities;Therapeutic exercise;Patient/family education;Manual techniques    PT Next Visit Plan  Continue with POC to improve LBP and return to PLOF. STW/M and modalities PRN for pain relief.    Consulted and Agree with Plan of Care  Patient       Patient will benefit from skilled therapeutic intervention in order to improve the following deficits and impairments:  Abnormal gait, Decreased activity tolerance, Postural dysfunction, Pain, Decreased strength  Visit Diagnosis: Chronic left-sided low back pain with left-sided sciatica     Problem List There are no active problems to display for this patient.   Kelsey P Kennon, PTA 11/16/2017, 9:20 AM  [facility] [address] [place], [place], [address] Phone: [phone]   Fax:  [phone]  Name: Darron S Gaudin MRN: [id number] Date of Birth: 07/31/1946   

## 2017-11-20 ENCOUNTER — Encounter: Payer: Self-pay | Admitting: Physical Therapy

## 2017-11-20 ENCOUNTER — Ambulatory Visit: Payer: Medicare HMO | Admitting: Physical Therapy

## 2017-11-20 DIAGNOSIS — M5442 Lumbago with sciatica, left side: Principal | ICD-10-CM

## 2017-11-20 DIAGNOSIS — G8929 Other chronic pain: Secondary | ICD-10-CM

## 2017-11-20 NOTE — Therapy (Signed)
[facility] [place], [place], [address] Phone: [phone]   Fax:  [phone]  Physical Therapy Treatment  Patient Details  Name: Joel Kim MRN: [id number] Date of Birth: Jun 29, 1946 Referring Provider: Glenna Fellows MD   Encounter Date: 11/20/2017  PT End of Session - 11/20/17 0804    Visit Number  8    Number of Visits  16    Date for PT Re-Evaluation  12/22/17    PT Start Time  0733    PT Stop Time  0829    PT Time Calculation (min)  56 min    Activity Tolerance  Patient tolerated treatment well    Behavior During Therapy  [facility] for tasks assessed/performed       Past Medical History:  Diagnosis Date  . Asthma   . Cancer ([place])   . Hypertension   . Thyroid disease   . TIA (transient ischemic attack)     Past Surgical History:  Procedure Laterality Date  . BACK SURGERY    . CHOLECYSTECTOMY    . FOOT SURGERY    . JOINT REPLACEMENT      There were no vitals filed for this visit.  Subjective Assessment - 11/20/17 0734    Subjective  No new complaints upon arrival    Pertinent History  2 prior low back surgeries.    How long can you stand comfortably?  10-15 minutes.    How long can you walk comfortably?  Short community distances.    Patient Stated Goals  Get my pain down to where it was before.    Currently in Pain?  Yes    Pain Score  3     Pain Location  Back    Pain Orientation  Lower    Pain Descriptors / Indicators  Discomfort    Pain Type  Chronic pain    Pain Onset  More than a month ago    Pain Frequency  Constant    Aggravating Factors   any prolong activity    Pain Relieving Factors  at rest                      [facility] Adult PT Treatment/Exercise - 11/20/17 0001      Lumbar Exercises: Aerobic   Nustep  Level 5 x15 minutes with draw ins and posture focus      Lumbar Exercises: Standing   Row  Strengthening;Both;20 reps;10 reps    Row Limitations  pink XTS    Shoulder  Extension  Strengthening;Both;20 reps;10 reps    Shoulder Extension Limitations  pink XTS    Other Standing Lumbar Exercises  standing reachouts and diagnols 2# ball 2x10 each      Lumbar Exercises: Supine   Bent Knee Raise  3 seconds 2x10    Bridge with [employer]  3 seconds;15 reps    Bridge with clamshell  3 seconds;Limitations;15 reps    Bridge with [employer] Limitations  with red T-band    Straight Leg Raise  3 seconds 2x10      Lumbar Exercises: Sidelying   Hip Abduction  Both;15 reps      Moist Heat Therapy   Number Minutes Moist Heat  15 Minutes    Moist Heat Location  Lumbar Spine      Electrical Stimulation   Electrical Stimulation Location  B low back    Electrical Stimulation Action  IFC    Electrical Stimulation Parameters  80-150hz   x15 min    Electrical Stimulation Goals  Pain               PT Short Term Goals - 10/23/17 1625      PT SHORT TERM GOAL #1   Title  STG's=LTG's.        PT Long Term Goals - 11/16/17 7209      PT LONG TERM GOAL #1   Title  Independent with a HEP.    Time  8    Period  Weeks    Status  On-going      PT LONG TERM GOAL #2   Title  Perform ADL's with pain not > 4/10.    Time  8    Period  Weeks    Status  On-going      PT LONG TERM GOAL #3   Title  Eliminate left thigh pain.    Time  8    Period  Weeks    Status  Partially Met " sometimes" 11/16/2017            Plan - 11/20/17 0810    Clinical Impression Statement  Patient arrived with no new complaints and minimal discomfort overall. Patient has reported overall improvement with left thigh symptoms. Patient able to progress reps with current exercises and progress with new activities today for core activation/strength focus. Patient has ongoing discomfort reported with any prolong activity and ongoing weakness in bil hips left more than right. current goals ongoing at this time due to pain and strength deficts.      Rehab Potential  Fair    PT Frequency   2x / week    PT Duration  8 weeks    PT Treatment/Interventions  ADLs/Self Care Home Management;Cryotherapy;Electrical Stimulation;Ultrasound;Moist Heat;Therapeutic activities;Therapeutic exercise;Patient/family education;Manual techniques    PT Next Visit Plan  Continue with POC to improve LBP and return to PLOF. STW/M and modalities PRN for pain relief.    Consulted and Agree with Plan of Care  Patient       Patient will benefit from skilled therapeutic intervention in order to improve the following deficits and impairments:  Abnormal gait, Decreased activity tolerance, Postural dysfunction, Pain, Decreased strength  Visit Diagnosis: Chronic left-sided low back pain with left-sided sciatica     Problem List There are no active problems to display for this patient.   Phillips Climes, PTA 11/20/2017, 8:29 AM  [facility] [place], [place], [address] Phone: [phone]   Fax:  [phone]  Name: Joel Kim MRN: [id number] Date of Birth: Feb 18, 1946

## 2017-11-23 ENCOUNTER — Ambulatory Visit: Payer: Medicare HMO | Admitting: Physical Therapy

## 2017-11-23 ENCOUNTER — Encounter: Payer: Self-pay | Admitting: Physical Therapy

## 2017-11-23 DIAGNOSIS — G8929 Other chronic pain: Secondary | ICD-10-CM

## 2017-11-23 DIAGNOSIS — M5442 Lumbago with sciatica, left side: Principal | ICD-10-CM

## 2017-11-23 NOTE — Therapy (Addendum)
[facility] [place], [place], [address] Phone: [phone]   Fax:  [phone]  Physical Therapy Treatment PHYSICAL THERAPY DISCHARGE SUMMARY  Visits from Start of Care: 9  Current functional level related to goals / functional outcomes: See below   Remaining deficits: See goals   Education / Equipment: HEP Plan: Patient agrees to discharge.  Patient goals were not met. Patient is being discharged due to not returning since the last visit.  ?????    Gabriela Eves, PT, DPT 11/07/18   Patient Details  Name: ZAE KIRTZ MRN: [id number] Date of Birth: [birth year] Referring Provider: Glenna Fellows MD   Encounter Date: 11/23/2017  PT End of Session - 11/23/17 0850    Visit Number  9    Number of Visits  16    Date for PT Re-Evaluation  12/22/17    PT Start Time  0822    PT Stop Time  0917    PT Time Calculation (min)  55 min    Activity Tolerance  Patient tolerated treatment well    Behavior During Therapy  [facility] for tasks assessed/performed       Past Medical History:  Diagnosis Date  . Asthma   . Cancer ([place])   . Hypertension   . Thyroid disease   . TIA (transient ischemic attack)     Past Surgical History:  Procedure Laterality Date  . BACK SURGERY    . CHOLECYSTECTOMY    . FOOT SURGERY    . JOINT REPLACEMENT      There were no vitals filed for this visit.  Subjective Assessment - 11/23/17 0825    Subjective  No new complaints upon arrival    Pertinent History  2 prior low back surgeries.    How long can you stand comfortably?  10-15 minutes.    How long can you walk comfortably?  Short community distances.    Patient Stated Goals  Get my pain down to where it was before.    Currently in Pain?  Yes    Pain Score  3     Pain Location  Back    Pain Orientation  Lower    Pain Descriptors / Indicators  Discomfort    Pain Type  Chronic pain    Pain Onset  More than a month ago    Pain  Frequency  Intermittent    Aggravating Factors   any prolong activity    Pain Relieving Factors  at rest                      [facility] Adult PT Treatment/Exercise - 11/23/17 0001      Lumbar Exercises: Aerobic   Nustep  Level 5 x15 minutes with draw ins and posture focus      Lumbar Exercises: Standing   Row  Strengthening;Both;20 reps;10 reps    Row Limitations  pink XTS    Shoulder Extension  Strengthening;Both;20 reps;10 reps    Shoulder Extension Limitations  pink XTS    Other Standing Lumbar Exercises  standing reachouts and diagnols 2# ball 2x10 each      Lumbar Exercises: Supine   Bent Knee Raise  20 reps;3 seconds    Bridge with [employer]  3 seconds;15 reps    Bridge with clamshell  3 seconds;Limitations;15 reps    Bridge with [employer] Limitations  with red T-band    Straight Leg Raise  20 reps;3 seconds  Moist Heat Therapy   Number Minutes Moist Heat  15 Minutes    Moist Heat Location  Lumbar Spine      Electrical Stimulation   Electrical Stimulation Location  B low back    Electrical Stimulation Action  IFC    Electrical Stimulation Parameters  80-150hz  x77mn    Electrical Stimulation Goals  Pain             PT Education - 11/23/17 0916    Education provided  Yes    Education Details  HEP    Person(s) Educated  Patient    Methods  Explanation;Demonstration;Handout    Comprehension  Verbalized understanding;Returned demonstration       PT Short Term Goals - 10/23/17 1625      PT SHORT TERM GOAL #1   Title  STG's=LTG's.        PT Long Term Goals - 11/23/17 0900      PT LONG TERM GOAL #1   Title  Independent with a HEP.    Time  8    Period  Weeks    Status  Achieved      PT LONG TERM GOAL #2   Title  Perform ADL's with pain not > 4/10.    Time  8    Period  Weeks    Status  On-going      PT LONG TERM GOAL #3   Title  Eliminate left thigh pain.    Time  8    Period  Weeks    Status  Partially Met improved yet  still there at times 11/23/17            Plan - 11/23/17 0851    Clinical Impression Statement  Patient tolerated treatment well today. Patient has no new complaints today and minimal discomfort overall. Patient has ongoing improvement with symptoms in left thigh. Patient continuing with core activation strengthening exercises. LTG #1 met and other Goals progressing.     Rehab Potential  Fair    PT Frequency  2x / week    PT Duration  8 weeks    PT Treatment/Interventions  ADLs/Self Care Home Management;Cryotherapy;Electrical Stimulation;Ultrasound;Moist Heat;Therapeutic activities;Therapeutic exercise;Patient/family education;Manual techniques    PT Next Visit Plan  Continue with POC to improve LBP and return to PLOF. STW/M and modalities PRN for pain relief. May want to be put on hold to see how he feels.     Consulted and Agree with Plan of Care  Patient       Patient will benefit from skilled therapeutic intervention in order to improve the following deficits and impairments:  Abnormal gait, Decreased activity tolerance, Postural dysfunction, Pain, Decreased strength  Visit Diagnosis: Chronic left-sided low back pain with left-sided sciatica     Problem List There are no active problems to display for this patient.   DPhillips Climes PTA 11/23/2017, 9:20 AM  [facility] [address]Phone: [phone]  Fax:  [phone] Name: BLAUTARO KORALMRN: [id number] of Birth: 130-Aug-1947

## 2017-11-23 NOTE — Patient Instructions (Signed)
Scapular Retraction: Bilateral   Facing anchor, pull arms back, bringing shoulder blades together. Repeat _30___ times per set. Do __1-2__ sets per session. Do _1-2___ sessions per day.      ELASTIC BAND LAT PULLS  Hold an elastic band with both arms in front of you and with your elbows straight. Your arms should be elevated. Next, pull the band downwards and back towards your side as you bend your elbows.

## 2018-05-12 ENCOUNTER — Emergency Department (HOSPITAL_COMMUNITY)
Admission: EM | Admit: 2018-05-12 | Discharge: 2018-05-13 | Disposition: A | Discharge status: Home / Self Care | Payer: Medicare HMO | Attending: Emergency Medicine | Admitting: Emergency Medicine

## 2018-05-12 ENCOUNTER — Encounter (HOSPITAL_COMMUNITY): Payer: Self-pay | Admitting: Emergency Medicine

## 2018-05-12 ENCOUNTER — Emergency Department (HOSPITAL_COMMUNITY): Payer: Medicare HMO

## 2018-05-12 ENCOUNTER — Other Ambulatory Visit: Payer: Self-pay

## 2018-05-12 DIAGNOSIS — I10 Essential (primary) hypertension: Secondary | ICD-10-CM | POA: Diagnosis not present

## 2018-05-12 DIAGNOSIS — J40 Bronchitis, not specified as acute or chronic: Secondary | ICD-10-CM

## 2018-05-12 DIAGNOSIS — Z859 Personal history of malignant neoplasm, unspecified: Secondary | ICD-10-CM | POA: Diagnosis not present

## 2018-05-12 DIAGNOSIS — R05 Cough: Secondary | ICD-10-CM | POA: Diagnosis present

## 2018-05-12 DIAGNOSIS — J45901 Unspecified asthma with (acute) exacerbation: Secondary | ICD-10-CM | POA: Insufficient documentation

## 2018-05-12 DIAGNOSIS — Z79899 Other long term (current) drug therapy: Secondary | ICD-10-CM | POA: Insufficient documentation

## 2018-05-12 DIAGNOSIS — Z8673 Personal history of transient ischemic attack (TIA), and cerebral infarction without residual deficits: Secondary | ICD-10-CM | POA: Insufficient documentation

## 2018-05-12 DIAGNOSIS — E039 Hypothyroidism, unspecified: Secondary | ICD-10-CM | POA: Diagnosis not present

## 2018-05-12 LAB — BASIC METABOLIC PANEL
Anion gap: 8 (ref 5–15)
BUN: 15 mg/dL (ref 8–23)
CHLORIDE: 98 mmol/L (ref 98–111)
CO2: 30 mmol/L (ref 22–32)
Calcium: 9.3 mg/dL (ref 8.9–10.3)
Creatinine, Ser: 1.31 mg/dL — ABNORMAL HIGH (ref 0.61–1.24)
GFR calc Af Amer: >60 mL/min (ref >60)
GFR calc non Af Amer: 53 mL/min — ABNORMAL LOW (ref >60)
Glucose, Bld: 146 mg/dL — ABNORMAL HIGH (ref 70–99)
POTASSIUM: 3.4 mmol/L — AB (ref 3.5–5.1)
SODIUM: 136 mmol/L (ref 135–145)

## 2018-05-12 LAB — CBC WITH DIFFERENTIAL/PLATELET
Basophils Absolute: 0.1 10*3/uL (ref 0.0–0.1)
Basophils Relative: 1 %
Eosinophils Absolute: 0.3 10*3/uL (ref 0.0–0.7)
Eosinophils Relative: 4 %
HEMATOCRIT: 43.1 % (ref 39.0–52.0)
Hemoglobin: 15.2 g/dL (ref 13.0–17.0)
LYMPHS ABS: 0.8 10*3/uL (ref 0.7–4.0)
Lymphocytes Relative: 11 %
MCH: 31.6 pg (ref 26.0–34.0)
MCHC: 35.3 g/dL (ref 30.0–36.0)
MCV: 89.6 fL (ref 78.0–100.0)
Monocytes Absolute: 0.6 10*3/uL (ref 0.1–1.0)
Monocytes Relative: 8 %
NEUTROS PCT: 76 %
Neutro Abs: 5.9 10*3/uL (ref 1.7–7.7)
Platelets: 191 10*3/uL (ref 150–400)
RBC: 4.81 MIL/uL (ref 4.22–5.81)
RDW: 13.2 % (ref 11.5–15.5)
WBC: 7.7 10*3/uL (ref 4.0–10.5)

## 2018-05-12 LAB — GROUP A STREP BY PCR: Group A Strep by PCR: NOT DETECTED

## 2018-05-12 MED ORDER — SODIUM CHLORIDE 0.9 % IV BOLUS
1000.0000 mL | Freq: Once | INTRAVENOUS | Status: AC
  Administered 2018-05-12: 1000 mL via INTRAVENOUS

## 2018-05-12 MED ORDER — ALBUTEROL SULFATE (2.5 MG/3ML) 0.083% IN NEBU
INHALATION_SOLUTION | RESPIRATORY_TRACT | Status: AC
  Administered 2018-05-12: 5 mg
  Filled 2018-05-12: qty 6

## 2018-05-12 MED ORDER — IPRATROPIUM-ALBUTEROL 0.5-2.5 (3) MG/3ML IN SOLN
3.0000 mL | Freq: Once | RESPIRATORY_TRACT | Status: AC
  Administered 2018-05-12: 3 mL via RESPIRATORY_TRACT
  Filled 2018-05-12: qty 3

## 2018-05-12 MED ORDER — ACETAMINOPHEN 325 MG PO TABS
650.0000 mg | ORAL_TABLET | Freq: Once | ORAL | Status: AC
  Administered 2018-05-12: 650 mg via ORAL
  Filled 2018-05-12: qty 2

## 2018-05-12 NOTE — ED Notes (Signed)
Patient in x-ray. Respiratory has been notified.

## 2018-05-12 NOTE — ED Triage Notes (Signed)
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

## 2018-05-12 NOTE — ED Provider Notes (Signed)
Care assumed from Dr. Alvino Chapel.  Patient with 3-day history of cough, sore throat, fever to 101.  Generalized weakness.  Wheezing on exam and given breathing treatment.  Chest x-ray negative.  Awaiting rapid strep, pulse ox with ambulation on recheck.  Rapid strep negative.   Moving air well. No wheezing. Ambulatory without desaturation.  We will treat with bronchodilators and antibiotics for likely bronchitis.  No wheezing so we will hold steroids at this time.  Follow-up with PCP.  Return precautions discussed  BP 105/60   Pulse 72   Temp 99 F (37.2 C) (Oral)   Resp 18   Ht 5\' 5"  (1.651 m)   Wt 81.6 kg   SpO2 93%   BMI 29.95 kg/m     Joel Essex, MD 05/13/18 [phone]

## 2018-05-12 NOTE — ED Provider Notes (Signed)
[facility] EMERGENCY DEPARTMENT Provider Note   CSN: [id number] Arrival date & time: 05/12/18  2149     History   Chief Complaint Chief Complaint  Patient presents with  . Cough  . Sore Throat    HPI Joel Kim is a 72 y.o. male.   Cough  Associated symptoms include sore throat and myalgias. Pertinent negatives include no chest pain.  Sore Throat  Pertinent negatives include no chest pain and no abdominal pain.  Patient had sore throat and cough for the last 3 days.  States he thought it was beginning because he had been Bushhogging and taking down weeds.  States however it is continued to bother him.  Fevers up to 101 at home.  Has had a cough that has been productive patient states he will not look at the sputum.  Still continues to have sore throat.  States he feels weak all over.  Has muscle aches.  No nausea vomiting or diarrhea.  No sick contacts.  Past Medical History:  Diagnosis Date  . Asthma   . Cancer ([place])   . Hypertension   . Thyroid disease   . TIA (transient ischemic attack)     There are no active problems to display for this patient.   Past Surgical History:  Procedure Laterality Date  . BACK SURGERY    . CHOLECYSTECTOMY    . FOOT SURGERY    . JOINT REPLACEMENT          Home Medications    Prior to Admission medications   Medication Sig Start Date End Date Taking? Authorizing Provider  albuterol (PROVENTIL HFA;VENTOLIN HFA) 108 (90 BASE) MCG/ACT inhaler Inhale 1-2 puffs into the lungs every 6 (six) hours as needed for wheezing. Patient not taking: Reported on 10/23/2017 04/14/14   Tanna Furry, MD  amLODipine (NORVASC) 5 MG tablet Take 5 mg by mouth daily.    [provider]  baclofen (LIORESAL) 10 MG tablet Take 10 mg by mouth 3 (three) times daily.    [provider]  cetirizine (ZYRTEC) 10 MG tablet Take 10 mg by mouth at bedtime.     [provider]  Cholecalciferol (VITAMIN D3) 1000 UNITS CAPS Take 1,000  Units by mouth daily.     [provider]  gabapentin (NEURONTIN) 300 MG capsule Take 300-600 mg by mouth at bedtime.     [provider]  hydrochlorothiazide (HYDRODIURIL) 25 MG tablet Take 25 mg by mouth daily.    [provider]  levothyroxine (SYNTHROID, LEVOTHROID) 75 MCG tablet Take 75 mcg by mouth daily before breakfast.    [provider]  losartan (COZAAR) 100 MG tablet Take 100 mg by mouth daily.    [provider]  metoprolol succinate (TOPROL-XL) 50 MG 24 hr tablet Take 50 mg by mouth at bedtime. Take with or immediately following a meal. 04/02/13   Chipper Herb, MD  nortriptyline (PAMELOR) 25 MG capsule Take 25 mg by mouth at bedtime.    [provider]  Omega-3 Fatty Acids (FISH OIL ULTRA) 1000 MG CAPS Take 1,000 mg by mouth at bedtime.     [provider]  OxyCODONE (OXYCONTIN) 20 mg T12A Take 1 tablet (20 mg total) by mouth 3 (three) times daily as needed. 12/27/12   Chipper Herb, MD  pantoprazole (PROTONIX) 40 MG tablet Take 1 tablet (40 mg total) by mouth daily. 01/21/13   Chipper Herb, MD  potassium chloride (K-DUR) 10 MEQ tablet Take 10  mEq by mouth daily.    [provider]  testosterone cypionate (DEPOTESTOSTERONE CYPIONATE) 200 MG/ML injection Inject into the muscle every 21 ( twenty-one) days. 3mcg    [provider]  traZODone (DESYREL) 50 MG tablet Take 50 mg by mouth at bedtime.    [provider]  zaleplon (SONATA) 10 MG capsule Take 1 capsule (10 mg total) by mouth at bedtime. If needed Patient not taking: Reported on 10/23/2017 08/05/13   Kathrynn Ducking, MD    Family History No family history on file.  Social History Social History   Tobacco Use  . Smoking status: Never Smoker  . Smokeless tobacco: Never Used  Substance Use Topics  . Alcohol use: No  . Drug use: No     Allergies   Statins; Adhesive [tape]; Ciprofloxacin; Flagyl [metronidazole]; and Reclast  [zoledronic acid]   Review of Systems Review of Systems  Constitutional: Positive for appetite change, fatigue and fever.  HENT: Positive for sore throat.   Respiratory: Positive for cough.   Cardiovascular: Negative for chest pain.  Gastrointestinal: Negative for abdominal pain.  Genitourinary: Negative for flank pain.  Musculoskeletal: Positive for myalgias.  Skin: Negative for wound.  Neurological: Positive for weakness.  Hematological: Negative for adenopathy.  Psychiatric/Behavioral: Negative for confusion.  Patient has had   Physical Exam Updated Vital Signs BP (!) 141/85 (BP Location: Right Arm)   Pulse (!) 103   Temp (!) 101.3 F (38.5 C) (Rectal)   Resp 17   Ht 5\' 5"  (1.651 m)   Wt 81.6 kg   SpO2 97%   BMI 29.95 kg/m   Physical Exam  Constitutional: He appears well-developed.  HENT:  Head: Normocephalic.  Mouth/Throat: Mucous membranes are normal. No oropharyngeal exudate.  Posterior pharyngeal erythema over tonsils and uvula.  Mild edema.  No exudate.  Eyes: EOM are normal.  Cardiovascular:  Mild tachycardia  Pulmonary/Chest:  Frequent cough.  Mild diffuse wheezes with prolonged expirations.  Abdominal: Soft. There is no tenderness.  Lymphadenopathy:    He has cervical adenopathy.  Neurological: He is alert.  Skin: Skin is warm. Capillary refill takes less than 2 seconds.     ED Treatments / Results  Labs (all labs ordered are listed, but only abnormal results are displayed) Labs Reviewed  GROUP A STREP BY PCR  CBC WITH DIFFERENTIAL/PLATELET  BASIC METABOLIC PANEL    EKG None  Radiology No results found.  Procedures Procedures (including critical care time)  Medications Ordered in ED Medications  sodium chloride 0.9 % bolus 1,000 mL (1,000 mLs Intravenous New Bag/Given 05/12/18 2240)  ipratropium-albuterol (DUONEB) 0.5-2.5 (3) MG/3ML nebulizer solution 3 mL (has no administration in time range)     Initial Impression / Assessment  and Plan / ED Course  I have reviewed the triage vital signs and the nursing notes.  Pertinent labs & imaging results that were available during my care of the patient were reviewed by me and considered in my medical decision making (see chart for details).     Patient with cough and fever.  Diffuse wheezes.  Also posterior pharyngeal erythema.  Will get strep and x-ray.  Fluid bolus given.  Care will be turned over to Dr. Wyvonnia Dusky.  Final Clinical Impressions(s) / ED Diagnoses   Final diagnoses:  None    ED Discharge Orders    None       Davonna Belling, MD 05/12/18 2311

## 2018-05-12 NOTE — ED Notes (Signed)
Patient drinking ginger-ale at this time. Patient also eating ice chips.

## 2018-05-13 MED ORDER — AZITHROMYCIN 250 MG PO TABS: 250.0000 mg | ORAL_TABLET | Freq: Every day | ORAL | 0 refills | Status: DC

## 2018-05-13 MED ORDER — KETOROLAC TROMETHAMINE 30 MG/ML IJ SOLN
15.0000 mg | Freq: Once | INTRAMUSCULAR | Status: AC
  Administered 2018-05-13: 15 mg via INTRAVENOUS
  Filled 2018-05-13: qty 1

## 2018-05-13 MED ORDER — IPRATROPIUM-ALBUTEROL 0.5-2.5 (3) MG/3ML IN SOLN
3.0000 mL | Freq: Once | RESPIRATORY_TRACT | Status: AC
  Administered 2018-05-13: 3 mL via RESPIRATORY_TRACT
  Filled 2018-05-13: qty 3

## 2018-05-13 MED ORDER — ALBUTEROL SULFATE HFA 108 (90 BASE) MCG/ACT IN AERS: 2.0000 | INHALATION_SPRAY | Freq: Four times a day (QID) | RESPIRATORY_TRACT | 0 refills | Status: AC | PRN

## 2018-05-13 NOTE — ED Notes (Signed)
Patient signed discharge signature but signature did not capture.

## 2018-05-13 NOTE — ED Notes (Signed)
Patient ambulated with room air oxygen saturation of 95 percent.

## 2018-05-13 NOTE — Discharge Instructions (Addendum)
Take the antibiotics as prescribed, use the inhaler every 4 hours as needed.  Use Tylenol or ibuprofen as needed for fever follow-up with your doctor.  Return to the ED develop occultly breathing, chest pain or any other concerns.

## 2018-05-31 ENCOUNTER — Encounter (HOSPITAL_COMMUNITY): Payer: Self-pay | Admitting: Emergency Medicine

## 2018-05-31 ENCOUNTER — Emergency Department (HOSPITAL_COMMUNITY): Payer: Medicare HMO

## 2018-05-31 ENCOUNTER — Other Ambulatory Visit: Payer: Self-pay

## 2018-05-31 ENCOUNTER — Inpatient Hospital Stay (HOSPITAL_COMMUNITY)
Admission: EM | Admit: 2018-05-31 | Discharge: 2018-06-02 | DRG: 440 | Disposition: A | Discharge status: Home / Self Care | Payer: Medicare HMO | Attending: Internal Medicine | Admitting: Internal Medicine

## 2018-05-31 DIAGNOSIS — K859 Acute pancreatitis without necrosis or infection, unspecified: Secondary | ICD-10-CM | POA: Diagnosis present

## 2018-05-31 DIAGNOSIS — G894 Chronic pain syndrome: Secondary | ICD-10-CM | POA: Diagnosis present

## 2018-05-31 DIAGNOSIS — J40 Bronchitis, not specified as acute or chronic: Secondary | ICD-10-CM | POA: Diagnosis present

## 2018-05-31 DIAGNOSIS — K851 Biliary acute pancreatitis without necrosis or infection: Principal | ICD-10-CM | POA: Diagnosis present

## 2018-05-31 DIAGNOSIS — F329 Major depressive disorder, single episode, unspecified: Secondary | ICD-10-CM | POA: Diagnosis present

## 2018-05-31 DIAGNOSIS — R109 Unspecified abdominal pain: Secondary | ICD-10-CM

## 2018-05-31 DIAGNOSIS — N289 Disorder of kidney and ureter, unspecified: Secondary | ICD-10-CM | POA: Diagnosis present

## 2018-05-31 DIAGNOSIS — Z8673 Personal history of transient ischemic attack (TIA), and cerebral infarction without residual deficits: Secondary | ICD-10-CM | POA: Diagnosis not present

## 2018-05-31 DIAGNOSIS — E039 Hypothyroidism, unspecified: Secondary | ICD-10-CM | POA: Diagnosis present

## 2018-05-31 DIAGNOSIS — Z79890 Hormone replacement therapy (postmenopausal): Secondary | ICD-10-CM | POA: Diagnosis not present

## 2018-05-31 DIAGNOSIS — R748 Abnormal levels of other serum enzymes: Secondary | ICD-10-CM | POA: Diagnosis present

## 2018-05-31 DIAGNOSIS — J45909 Unspecified asthma, uncomplicated: Secondary | ICD-10-CM | POA: Diagnosis present

## 2018-05-31 DIAGNOSIS — I10 Essential (primary) hypertension: Secondary | ICD-10-CM | POA: Diagnosis present

## 2018-05-31 LAB — COMPREHENSIVE METABOLIC PANEL
ALBUMIN: 3.7 g/dL (ref 3.5–5.0)
ALK PHOS: 80 U/L (ref 38–126)
ALT: 37 U/L (ref 0–44)
AST: 26 U/L (ref 15–41)
Anion gap: 9 (ref 5–15)
BUN: 25 mg/dL — AB (ref 8–23)
CALCIUM: 8.6 mg/dL — AB (ref 8.9–10.3)
CHLORIDE: 94 mmol/L — AB (ref 98–111)
CO2: 31 mmol/L (ref 22–32)
CREATININE: 1.41 mg/dL — AB (ref 0.61–1.24)
GFR calc non Af Amer: 49 mL/min — ABNORMAL LOW (ref >60)
GFR, EST AFRICAN AMERICAN: 56 mL/min — AB (ref >60)
GLUCOSE: 108 mg/dL — AB (ref 70–99)
Potassium: 3.6 mmol/L (ref 3.5–5.1)
SODIUM: 134 mmol/L — AB (ref 135–145)
Total Bilirubin: 2.1 mg/dL — ABNORMAL HIGH (ref 0.3–1.2)
Total Protein: 6.6 g/dL (ref 6.5–8.1)

## 2018-05-31 LAB — CBC
HCT: 49.2 % (ref 39.0–52.0)
Hemoglobin: 17.4 g/dL — ABNORMAL HIGH (ref 13.0–17.0)
MCH: 32.3 pg (ref 26.0–34.0)
MCHC: 35.4 g/dL (ref 30.0–36.0)
MCV: 91.3 fL (ref 78.0–100.0)
PLATELETS: 235 10*3/uL (ref 150–400)
RBC: 5.39 MIL/uL (ref 4.22–5.81)
RDW: 14.1 % (ref 11.5–15.5)
WBC: 19.5 10*3/uL — ABNORMAL HIGH (ref 4.0–10.5)

## 2018-05-31 LAB — LIPASE, BLOOD: Lipase: 370 U/L — ABNORMAL HIGH (ref 11–51)

## 2018-05-31 MED ORDER — FENTANYL CITRATE (PF) 100 MCG/2ML IJ SOLN
50.0000 ug | INTRAMUSCULAR | Status: DC | PRN
  Administered 2018-06-01 (×3): 100 ug via INTRAVENOUS
  Filled 2018-05-31 (×3): qty 2

## 2018-05-31 MED ORDER — ONDANSETRON HCL 4 MG/2ML IJ SOLN
4.0000 mg | Freq: Four times a day (QID) | INTRAMUSCULAR | Status: DC | PRN
  Administered 2018-06-01 (×2): 4 mg via INTRAVENOUS
  Filled 2018-05-31 (×2): qty 2

## 2018-05-31 MED ORDER — HYDRALAZINE HCL 20 MG/ML IJ SOLN: 10.0000 mg | INTRAMUSCULAR | Status: DC | PRN

## 2018-05-31 MED ORDER — ACETAMINOPHEN 325 MG PO TABS: 650.0000 mg | ORAL_TABLET | Freq: Four times a day (QID) | ORAL | Status: DC | PRN

## 2018-05-31 MED ORDER — SODIUM CHLORIDE 0.9 % IV SOLN
INTRAVENOUS | Status: AC
  Administered 2018-06-01 (×2): via INTRAVENOUS

## 2018-05-31 MED ORDER — ALBUTEROL SULFATE (2.5 MG/3ML) 0.083% IN NEBU: 3.0000 mL | INHALATION_SOLUTION | Freq: Four times a day (QID) | RESPIRATORY_TRACT | Status: DC | PRN

## 2018-05-31 MED ORDER — HYDROMORPHONE HCL 1 MG/ML IJ SOLN
1.0000 mg | Freq: Once | INTRAMUSCULAR | Status: AC
  Administered 2018-05-31: 1 mg via INTRAVENOUS
  Filled 2018-05-31: qty 1

## 2018-05-31 MED ORDER — SODIUM CHLORIDE 0.9 % IV BOLUS
1000.0000 mL | Freq: Once | INTRAVENOUS | Status: AC
  Administered 2018-05-31: 1000 mL via INTRAVENOUS

## 2018-05-31 MED ORDER — IOPAMIDOL (ISOVUE-300) INJECTION 61%
100.0000 mL | Freq: Once | INTRAVENOUS | Status: AC | PRN
  Administered 2018-05-31: 100 mL via INTRAVENOUS

## 2018-05-31 MED ORDER — ONDANSETRON HCL 4 MG/2ML IJ SOLN
4.0000 mg | Freq: Once | INTRAMUSCULAR | Status: AC
  Administered 2018-05-31: 4 mg via INTRAVENOUS
  Filled 2018-05-31: qty 2

## 2018-05-31 MED ORDER — ACETAMINOPHEN 650 MG RE SUPP: 650.0000 mg | Freq: Four times a day (QID) | RECTAL | Status: DC | PRN

## 2018-05-31 MED ORDER — HEPARIN SODIUM (PORCINE) 5000 UNIT/ML IJ SOLN
5000.0000 [IU] | Freq: Three times a day (TID) | INTRAMUSCULAR | Status: DC
  Administered 2018-06-01 – 2018-06-02 (×3): 5000 [IU] via SUBCUTANEOUS
  Filled 2018-05-31 (×4): qty 1

## 2018-05-31 MED ORDER — ONDANSETRON HCL 4 MG PO TABS: 4.0000 mg | ORAL_TABLET | Freq: Four times a day (QID) | ORAL | Status: DC | PRN

## 2018-05-31 NOTE — ED Triage Notes (Signed)
Pt C/O generalized abdominal pain that radiates to the back that began 2 days ago. Pt states he feels nauseas but denies vomiting.

## 2018-05-31 NOTE — H&P (Signed)
History and Physical    AYDEN HARDWICK GBT:[id number] DOB: 07/23/1946 DOA: 05/31/2018  PCP: [name], MD   Patient coming from: Home   Chief Complaint: Abd pain, nausea   HPI: Joel Kim is a 72 y.o. male with medical history significant for asthma, hypertension, and hypothyroidism, now presenting to the emergency department for evaluation of severe abdominal pain and nausea.  Patient was recently suffering from an acute respiratory illness, was diagnosed with bronchitis, and has just completed treatment with azithromycin, doxycycline, and prednisone with resolution in those symptoms.  He developed severe abdominal pain yesterday with severe nausea.  Pain was waxing and waning initially, but has been constantly severe today.  He also reports severe nausea without vomiting or diarrhea.  Denies fevers or chills in the past couple days.  He also denies chest pain or shortness of breath at this time.  He denies any recent alcohol use and reports that he underwent cholecystectomy approximately 25 years ago.  ED Course: Upon arrival to the ED, patient is found to be afebrile, saturating well on room air, and with vitals otherwise normal.  Chemistry panel is notable for creatinine 1.41, slightly up from recent priors, and a total bilirubin of 2.1, normal on remote CMP.  Lipase is elevated to 370.  CBC features a leukocytosis to 19,500.  CT abdomen and pelvis reveals infiltration and edema about the head, body, and tail of pancreas consistent with acute pancreatitis.  Patient was given 2 L normal saline, Zofran, and Dilaudid in the ED.  He remains hemodynamically stable, but continues to complain of severe abdominal pain and nausea, unable to tolerate any oral intake, and will be admitted for ongoing evaluation and management of acute pancreatitis.  Review of Systems:  All other systems reviewed and apart from HPI, are negative.  Past Medical History:  Diagnosis Date  . Asthma   . Cancer  ([place])   . Hypertension   . Thyroid disease   . TIA (transient ischemic attack)     Past Surgical History:  Procedure Laterality Date  . BACK SURGERY    . CHOLECYSTECTOMY    . FOOT SURGERY    . JOINT REPLACEMENT       reports that he has never smoked. He has never used smokeless tobacco. He reports that he does not drink alcohol or use drugs.  Allergies  Allergen Reactions  . Tapentadol Rash    Breaks patient out  Breaks patient out    . Paroxetine Hcl Other (See Comments)    Irritability  Irritability    . Statins Other (See Comments)    Muscle aches  . Adhesive [Tape]     Breaks patient out   . Ciprofloxacin Hives  . Flagyl [Metronidazole]   . Reclast [Zoledronic Acid]     No family history on file.   Prior to Admission medications   Medication Sig Start Date End Date Taking? Authorizing Provider  albuterol (PROVENTIL HFA;VENTOLIN HFA) 108 (90 Base) MCG/ACT inhaler Inhale 2 puffs into the lungs every 6 (six) hours as needed. 05/13/18  Yes Rancour, Annie Main, MD  amLODipine (NORVASC) 5 MG tablet Take 5 mg by mouth daily.   Yes [provider]  baclofen (LIORESAL) 10 MG tablet Take 10 mg by mouth 3 (three) times daily.   Yes [provider]  cetirizine (ZYRTEC) 10 MG tablet Take 10 mg by mouth at bedtime.    Yes [provider]  Cholecalciferol (VITAMIN D3) 1000 UNITS CAPS Take 1,000  Units by mouth daily.    Yes [provider]  gabapentin (NEURONTIN) 300 MG capsule Take 300-600 mg by mouth at bedtime.    Yes [provider]  hydrochlorothiazide (HYDRODIURIL) 25 MG tablet Take 25 mg by mouth daily.   Yes [provider]  levothyroxine (SYNTHROID, LEVOTHROID) 75 MCG tablet Take 75 mcg by mouth daily before breakfast.   Yes [provider]  losartan (COZAAR) 100 MG tablet Take 100 mg by mouth daily.   Yes [provider]  metoprolol succinate (TOPROL-XL) 50 MG 24 hr tablet Take 50 mg by mouth at  bedtime. Take with or immediately following a meal. 04/02/13  Yes Chipper Herb, MD  nortriptyline (PAMELOR) 25 MG capsule Take 25 mg by mouth at bedtime.   Yes [provider]  Omega-3 Fatty Acids (FISH OIL ULTRA) 1000 MG CAPS Take 1,000 mg by mouth at bedtime.    Yes [provider]  Oxycodone HCl 20 MG TABS Take 1 tablet by mouth 3 (three) times daily as needed.   Yes [provider]  pantoprazole (PROTONIX) 40 MG tablet Take 1 tablet (40 mg total) by mouth daily. 01/21/13  Yes Chipper Herb, MD  traZODone (DESYREL) 50 MG tablet Take 50 mg by mouth at bedtime.   Yes [provider]  doxycycline (VIBRA-TABS) 100 MG tablet Take 1 tablet by mouth 2 (two) times daily. 05/24/18 06/03/18  [provider]  predniSONE (DELTASONE) 20 MG tablet Take 20-60 mg by mouth as directed. Take 3 tablets daily for 3 days, then take 2 tablets daily for 3 days, then take 1 tablet daily for 3 days, then stops 05/24/18   [provider]  TESTOSTERONE CYPIONATE IM Inject 1 Dose into the muscle every 14 (fourteen) days.     [provider]    Physical Exam: Vitals:   05/31/18 1859 05/31/18 2257  BP: (!) 127/96 (!) 128/93  Pulse: 98 90  Resp: 16 18  Temp: 98.7 F (37.1 C)   TempSrc: Oral   SpO2: 96% 91%      Constitutional: NAD, in apparent discomfort Eyes: PERTLA, lids and conjunctivae normal ENMT: Mucous membranes are moist. Posterior pharynx clear of any exudate or lesions.   Neck: normal, supple, no masses, no thyromegaly Respiratory: clear to auscultation bilaterally, no wheezing, no crackles. Normal respiratory effort.   Cardiovascular: S1 & S2 heard, regular rate and rhythm. Pedal edema bilaterally. Abdomen: No distension, soft, tender in epigastrium. Bowel sounds active.  Musculoskeletal: no clubbing / cyanosis. No joint deformity upper and lower extremities.    Skin: no significant rashes, lesions, ulcers. Warm, dry,  well-perfused. Neurologic: CN 2-12 grossly intact. Sensation intact. Strength 5/5 in all 4 limbs.  Psychiatric: Alert and oriented x 3. Calm, cooperative.     Labs on Admission: I have personally reviewed following labs and imaging studies  CBC: Recent Labs  Lab 05/31/18 1932  WBC 19.5*  HGB 17.4*  HCT 49.2  MCV 91.3  PLT 568   Basic Metabolic Panel: Recent Labs  Lab 05/31/18 1932  NA 134*  K 3.6  CL 94*  CO2 31  GLUCOSE 108*  BUN 25*  CREATININE 1.41*  CALCIUM 8.6*   GFR: CrCl cannot be calculated (Unknown ideal weight.). Liver Function Tests: Recent Labs  Lab 05/31/18 1932  AST 26  ALT 37  ALKPHOS 80  BILITOT 2.1*  PROT 6.6  ALBUMIN 3.7   Recent Labs  Lab 05/31/18 1932  LIPASE 370*   No  results for input(s): AMMONIA in the last 168 hours. Coagulation Profile: No results for input(s): INR, PROTIME in the last 168 hours. Cardiac Enzymes: No results for input(s): CKTOTAL, CKMB, CKMBINDEX, TROPONINI in the last 168 hours. BNP (last 3 results) No results for input(s): PROBNP in the last 8760 hours. HbA1C: No results for input(s): HGBA1C in the last 72 hours. CBG: No results for input(s): GLUCAP in the last 168 hours. Lipid Profile: No results for input(s): CHOL, HDL, LDLCALC, TRIG, CHOLHDL, LDLDIRECT in the last 72 hours. Thyroid Function Tests: No results for input(s): TSH, T4TOTAL, FREET4, T3FREE, THYROIDAB in the last 72 hours. Anemia Panel: No results for input(s): VITAMINB12, FOLATE, FERRITIN, TIBC, IRON, RETICCTPCT in the last 72 hours. Urine analysis:    Component Value Date/Time   COLORURINE YELLOW 11/06/2010 1951   APPEARANCEUR CLOUDY (A) 11/06/2010 1951   LABSPEC 1.005 11/06/2010 1951   PHURINE 8.0 11/06/2010 1951   HGBUR NEGATIVE 11/06/2010 1951   BILIRUBINUR NEGATIVE 11/06/2010 1951   KETONESUR NEGATIVE 11/06/2010 1951   PROTEINUR NEGATIVE 11/06/2010 1951   UROBILINOGEN 0.2 11/06/2010 1951   NITRITE NEGATIVE 11/06/2010 1951    LEUKOCYTESUR  11/06/2010 1951    NEGATIVE MICROSCOPIC NOT DONE ON URINES WITH NEGATIVE PROTEIN, BLOOD, LEUKOCYTES, NITRITE, OR GLUCOSE <1000 mg/dL.   Sepsis Labs: @LABRCNTIP (procalcitonin:4,lacticidven:4) )No results found for this or any previous visit (from the past 240 hour(s)).   Radiological Exams on Admission: Ct Abdomen Pelvis W Contrast  Result Date: 05/31/2018 CLINICAL DATA:  Generalized abdominal pain radiating to the back and beginning 2 days ago. Nausea. EXAM: CT ABDOMEN AND PELVIS WITH CONTRAST TECHNIQUE: Multidetector CT imaging of the abdomen and pelvis was performed using the standard protocol following bolus administration of intravenous contrast. CONTRAST:  129mL ISOVUE-300 IOPAMIDOL (ISOVUE-300) INJECTION 61% COMPARISON:  07/23/2015 FINDINGS: Lower chest: Atelectasis in the lung bases. Calcification in the coronary arteries and aortic valve. Hepatobiliary: No focal liver abnormality is seen. Status post cholecystectomy. No biliary dilatation. Pancreas: Infiltration and edema around the head, body, and tail of the pancreas. No loculated fluid collections. No pancreatic ductal dilatation. Homogeneous enhancement of the pancreatic parenchyma. No vascular thrombus. Changes are consistent with acute pancreatitis. Spleen: Normal in size without focal abnormality. Adrenals/Urinary Tract: Adrenal glands are unremarkable. Small bilateral renal cysts. Kidneys are otherwise normal, without renal calculi, focal solid lesion, or hydronephrosis. Bladder is unremarkable. Stomach/Bowel: Stomach is decompressed. There is mild wall thickening and hyperemia of the duodenal bulb and proximal duodenum. This could represent reactive inflammation or peptic ulcer. Small bowel are mostly decompressed. Scattered stool throughout the colon without abnormal distention. Scattered colonic diverticula. Appendix is normal. Vascular/Lymphatic: Aortic atherosclerosis. No enlarged abdominal or pelvic lymph nodes.  Reproductive: Prostate gland is surgically absent. Other: Free fluid in the upper abdomen, extending along the pericolic gutters to the pelvis. This is likely reactive. No free air. Abdominal wall musculature appears intact. Musculoskeletal: Degenerative changes in the spine. Postoperative posterior fixation at L4-5. No destructive bone lesions. IMPRESSION: 1. Infiltration and edema around the head, body, and tail of the pancreas consistent with acute pancreatitis. No loculated fluid collections. No evidence of pancreatic necrosis or vascular thrombus. 2. Wall thickening and hyperemia of the duodenal bulb and proximal duodenum may indicate reactive inflammation or peptic ulcer disease. 3. Free fluid in the abdomen and pelvis is likely reactive. 4. Atelectasis in the lung bases. Aortic Atherosclerosis (ICD10-I70.0). Electronically Signed   By: Lucienne Capers M.D.   On: 05/31/2018 22:32    EKG: Not performed.  Assessment/Plan   1. Acute pancreatitis  - Presents with severe upper abdominal pain and nausea  - Found to have elevated lipase and CT-findings consistent with acute uncomplicated pancreatitis  - He denies alcohol use, is s/p cholecystectomy and there is no biliary dilation on imaging  - He was fluid-resuscitated in ED with 2 liters NS  - Continue IVF hydration, antiemetics, and analgesia, check triglyceride level    2. Hypertension  - BP at goal  - Oral medications held on admission, use hydralazine IVP's as needed for now   3. Mild renal insufficiency  - SCr is 1.41 on admission, slightly higher than priors  - Continue IVF hydration, avoid nephrotoxins, repeat chem panel in am   4. Leukocytosis  - WBC is 19,500 on admission  - Afebrile on admission, likely reactive and/or secondary to glucocorticoid as he just completed prednisone for bronchitis   - Culture if febrile    DVT prophylaxis: sq heparin Code Status: Full  Family Communication: Wife updated at bedside Consults  called: none Admission status: Inpatient     Vianne Bulls, MD [facility] Pager [phone]  If 7PM-7AM, please contact night-coverage [email or web address] Password [email or web address]  05/31/2018, 11:44 PM

## 2018-05-31 NOTE — ED Provider Notes (Signed)
Discussed with Dr. [name] who will accept pt for admission   Noemi Chapel, MD 05/31/18 2309

## 2018-05-31 NOTE — ED Provider Notes (Addendum)
[facility] EMERGENCY DEPARTMENT Provider Note   CSN: [id number] Arrival date & time: 05/31/18  1850     History   Chief Complaint Chief Complaint  Patient presents with  . Abdominal Pain    HPI Joel Kim is a 72 y.o. male.  General abdominal pain with radiation to the back for 2 days with associated nausea but no vomiting or diarrhea.  No chest pain, dyspnea, fever, sweats, chills, dysuria, hematuria.  Surgical history includes cholecystectomy.  He has recently been treated for bronchitis with Zithromax and doxycycline.  Severity of pain is moderate to severe.  Palpation makes pain worse.     Past Medical History:  Diagnosis Date  . Asthma   . Cancer ([place])   . Hypertension   . Thyroid disease   . TIA (transient ischemic attack)     Patient Active Problem List   Diagnosis Date Noted  . Hypertension 05/31/2018  . Hypothyroidism 05/31/2018  . Acute pancreatitis 05/31/2018  . Mild renal insufficiency 05/31/2018    Past Surgical History:  Procedure Laterality Date  . BACK SURGERY    . CHOLECYSTECTOMY    . FOOT SURGERY    . JOINT REPLACEMENT          Home Medications    Prior to Admission medications   Medication Sig Start Date End Date Taking? Authorizing Provider  albuterol (PROVENTIL HFA;VENTOLIN HFA) 108 (90 Base) MCG/ACT inhaler Inhale 2 puffs into the lungs every 6 (six) hours as needed. 05/13/18  Yes Rancour, Annie Main, MD  amLODipine (NORVASC) 5 MG tablet Take 5 mg by mouth daily.   Yes [provider]  baclofen (LIORESAL) 10 MG tablet Take 10 mg by mouth 3 (three) times daily.   Yes [provider]  cetirizine (ZYRTEC) 10 MG tablet Take 10 mg by mouth at bedtime.    Yes [provider]  Cholecalciferol (VITAMIN D3) 1000 UNITS CAPS Take 1,000 Units by mouth daily.    Yes [provider]  gabapentin (NEURONTIN) 300 MG capsule Take 300-600 mg by mouth at bedtime.    Yes [provider]  hydrochlorothiazide  (HYDRODIURIL) 25 MG tablet Take 25 mg by mouth daily.   Yes [provider]  levothyroxine (SYNTHROID, LEVOTHROID) 75 MCG tablet Take 75 mcg by mouth daily before breakfast.   Yes [provider]  losartan (COZAAR) 100 MG tablet Take 100 mg by mouth daily.   Yes [provider]  metoprolol succinate (TOPROL-XL) 50 MG 24 hr tablet Take 50 mg by mouth at bedtime. Take with or immediately following a meal. 04/02/13  Yes Chipper Herb, MD  nortriptyline (PAMELOR) 25 MG capsule Take 25 mg by mouth at bedtime.   Yes [provider]  Omega-3 Fatty Acids (FISH OIL ULTRA) 1000 MG CAPS Take 1,000 mg by mouth at bedtime.    Yes [provider]  pantoprazole (PROTONIX) 40 MG tablet Take 1 tablet (40 mg total) by mouth daily. 01/21/13  Yes Chipper Herb, MD  traZODone (DESYREL) 50 MG tablet Take 50 mg by mouth at bedtime.   Yes [provider]  ondansetron (ZOFRAN ODT) 8 MG disintegrating tablet Take 1 tablet (8 mg total) by mouth every 8 (eight) hours as needed for nausea or vomiting. 06/02/18   Barton Dubois, MD  Oxycodone HCl 20 MG TABS Take 1 tablet (20 mg total) by mouth 3 (three) times daily as needed. 06/02/18   Barton Dubois, MD  TESTOSTERONE CYPIONATE IM Inject 1 mL into the  muscle every 14 (fourteen) days. Inject 1 ml every 14 days    [provider]    Family History No family history on file.  Social History Social History   Tobacco Use  . Smoking status: Never Smoker  . Smokeless tobacco: Never Used  Substance Use Topics  . Alcohol use: No  . Drug use: No     Allergies   Tapentadol; Paroxetine hcl; Statins; Adhesive [tape]; Ciprofloxacin; Flagyl [metronidazole]; and Reclast [zoledronic acid]   Review of Systems Review of Systems  All other systems reviewed and are negative.    Physical Exam Updated Vital Signs BP 126/72   Pulse 91   Temp 98.2 F (36.8 C) (Oral)   Resp 18   Ht 5\' 5"  (1.651 m)   Wt 83.7 kg    SpO2 95%   BMI 30.70 kg/m   Physical Exam  Constitutional: He is oriented to person, place, and time. He appears well-developed and well-nourished.  HENT:  Head: Normocephalic and atraumatic.  Eyes: Conjunctivae are normal.  Neck: Neck supple.  Cardiovascular: Normal rate and regular rhythm.  Pulmonary/Chest: Effort normal and breath sounds normal.  Abdominal: Bowel sounds are normal.  Generalized central tenderness.  Musculoskeletal: Normal range of motion.  Neurological: He is alert and oriented to person, place, and time.  Skin: Skin is warm and dry.  Psychiatric: He has a normal mood and affect. His behavior is normal.  Nursing note and vitals reviewed.    ED Treatments / Results  Labs (all labs ordered are listed, but only abnormal results are displayed) Labs Reviewed  LIPASE, BLOOD - Abnormal; Notable for the following components:      Result Value   Lipase 370 (*)    All other components within normal limits  COMPREHENSIVE METABOLIC PANEL - Abnormal; Notable for the following components:   Sodium 134 (*)    Chloride 94 (*)    Glucose, Bld 108 (*)    BUN 25 (*)    Creatinine, Ser 1.41 (*)    Calcium 8.6 (*)    Total Bilirubin 2.1 (*)    GFR calc non Af Amer 49 (*)    GFR calc Af Amer 56 (*)    All other components within normal limits  CBC - Abnormal; Notable for the following components:   WBC 19.5 (*)    Hemoglobin 17.4 (*)    All other components within normal limits  URINALYSIS, ROUTINE W REFLEX MICROSCOPIC - Abnormal; Notable for the following components:   Color, Urine STRAW (*)    Hgb urine dipstick LARGE (*)    All other components within normal limits  COMPREHENSIVE METABOLIC PANEL - Abnormal; Notable for the following components:   Calcium 7.8 (*)    Total Protein 5.6 (*)    Albumin 3.1 (*)    Total Bilirubin 2.4 (*)    All other components within normal limits  CBC - Abnormal; Notable for the following components:   WBC 16.0 (*)    All other  components within normal limits  LIPASE, BLOOD - Abnormal; Notable for the following components:   Lipase 209 (*)    All other components within normal limits  TRIGLYCERIDES - Abnormal; Notable for the following components:   Triglycerides 219 (*)    All other components within normal limits  COMPREHENSIVE METABOLIC PANEL - Abnormal; Notable for the following components:   Glucose, Bld 103 (*)    Calcium 7.6 (*)    Total Protein 5.4 (*)  Albumin 2.9 (*)    Total Bilirubin 2.1 (*)    All other components within normal limits  LIPASE, BLOOD - Abnormal; Notable for the following components:   Lipase 192 (*)    All other components within normal limits    EKG None  Radiology Ct Abdomen Pelvis W Contrast  Result Date: 05/31/2018 CLINICAL DATA:  Generalized abdominal pain radiating to the back and beginning 2 days ago. Nausea. EXAM: CT ABDOMEN AND PELVIS WITH CONTRAST TECHNIQUE: Multidetector CT imaging of the abdomen and pelvis was performed using the standard protocol following bolus administration of intravenous contrast. CONTRAST:  181mL ISOVUE-300 IOPAMIDOL (ISOVUE-300) INJECTION 61% COMPARISON:  07/23/2015 FINDINGS: Lower chest: Atelectasis in the lung bases. Calcification in the coronary arteries and aortic valve. Hepatobiliary: No focal liver abnormality is seen. Status post cholecystectomy. No biliary dilatation. Pancreas: Infiltration and edema around the head, body, and tail of the pancreas. No loculated fluid collections. No pancreatic ductal dilatation. Homogeneous enhancement of the pancreatic parenchyma. No vascular thrombus. Changes are consistent with acute pancreatitis. Spleen: Normal in size without focal abnormality. Adrenals/Urinary Tract: Adrenal glands are unremarkable. Small bilateral renal cysts. Kidneys are otherwise normal, without renal calculi, focal solid lesion, or hydronephrosis. Bladder is unremarkable. Stomach/Bowel: Stomach is decompressed. There is mild wall  thickening and hyperemia of the duodenal bulb and proximal duodenum. This could represent reactive inflammation or peptic ulcer. Small bowel are mostly decompressed. Scattered stool throughout the colon without abnormal distention. Scattered colonic diverticula. Appendix is normal. Vascular/Lymphatic: Aortic atherosclerosis. No enlarged abdominal or pelvic lymph nodes. Reproductive: Prostate gland is surgically absent. Other: Free fluid in the upper abdomen, extending along the pericolic gutters to the pelvis. This is likely reactive. No free air. Abdominal wall musculature appears intact. Musculoskeletal: Degenerative changes in the spine. Postoperative posterior fixation at L4-5. No destructive bone lesions. IMPRESSION: 1. Infiltration and edema around the head, body, and tail of the pancreas consistent with acute pancreatitis. No loculated fluid collections. No evidence of pancreatic necrosis or vascular thrombus. 2. Wall thickening and hyperemia of the duodenal bulb and proximal duodenum may indicate reactive inflammation or peptic ulcer disease. 3. Free fluid in the abdomen and pelvis is likely reactive. 4. Atelectasis in the lung bases. Aortic Atherosclerosis (ICD10-I70.0). Electronically Signed   By: Lucienne Capers M.D.   On: 05/31/2018 22:32    Procedures Procedures (including critical care time)  Medications Ordered in ED Medications  albuterol (PROVENTIL) (2.5 MG/3ML) 0.083% nebulizer solution 3 mL (has no administration in time range)  heparin injection 5,000 Units (5,000 Units Subcutaneous Given 06/02/18 0610)  0.9 %  sodium chloride infusion ( Intravenous Stopped 06/01/18 1154)  acetaminophen (TYLENOL) tablet 650 mg (has no administration in time range)    Or  acetaminophen (TYLENOL) suppository 650 mg (has no administration in time range)  ondansetron (ZOFRAN) tablet 4 mg ( Oral See Alternative 06/01/18 1310)    Or  ondansetron (ZOFRAN) injection 4 mg (4 mg Intravenous Given 06/01/18 1310)    hydrALAZINE (APRESOLINE) injection 10 mg (has no administration in time range)  HYDROmorphone (DILAUDID) injection 1 mg (1 mg Intravenous Given 06/01/18 1716)  oxyCODONE (Oxy IR/ROXICODONE) immediate release tablet 5 mg (5 mg Oral Given 06/02/18 0241)  0.9 %  sodium chloride infusion ( Intravenous Stopped 06/02/18 0005)  levothyroxine (SYNTHROID, LEVOTHROID) injection 37.5 mcg (37.5 mcg Intravenous Given 06/02/18 0808)  ondansetron (ZOFRAN) injection 4 mg (4 mg Intravenous Given 05/31/18 1957)  sodium chloride 0.9 % bolus 1,000 mL (0 mLs Intravenous Stopped 05/31/18  2103)  HYDROmorphone (DILAUDID) injection 1 mg (1 mg Intravenous Given 05/31/18 1957)  iopamidol (ISOVUE-300) 61 % injection 100 mL (100 mLs Intravenous Contrast Given 05/31/18 2121)  sodium chloride 0.9 % bolus 1,000 mL (0 mLs Intravenous Stopping Infusion hung by another clincian 06/01/18 0046)     Initial Impression / Assessment and Plan / ED Course  I have reviewed the triage vital signs and the nursing notes.  Pertinent labs & imaging results that were available during my care of the patient were reviewed by me and considered in my medical decision making (see chart for details).     Patient presents with generalized central abdominal pain for 2 days.  White count 19.5.  Lipase 370.  CT abdomen pelvis reveals acute pancreatitis.  Will admit for pain management and hydration.  CRITICAL CARE Performed by: Nat Christen Total critical care time: 30 minutes Critical care time was exclusive of separately billable procedures and treating other patients. Critical care was necessary to treat or prevent imminent or life-threatening deterioration. Critical care was time spent personally by me on the following activities: development of treatment plan with patient and/or surrogate as well as nursing, discussions with consultants, evaluation of patient's response to treatment, examination of patient, obtaining history from patient or surrogate, ordering  and performing treatments and interventions, ordering and review of laboratory studies, ordering and review of radiographic studies, pulse oximetry and re-evaluation of patient's condition. Final Clinical Impressions(s) / ED Diagnoses   Final diagnoses:  Abdominal pain, unspecified abdominal location  Elevated lipase  Acute pancreatitis, unspecified complication status, unspecified pancreatitis type    ED Discharge Orders         Ordered    Oxycodone HCl 20 MG TABS  3 times daily PRN     06/02/18 1013    ondansetron (ZOFRAN ODT) 8 MG disintegrating tablet  Every 8 hours PRN     06/02/18 1013    Increase activity slowly     06/02/18 1013    Diet - low sodium heart healthy     06/02/18 1013    Discharge instructions    Comments:  Heart healthy low-fat diet Maintain adequate hydration Follow-up with PCP in 10 days   06/02/18 1013           Nat Christen, MD 05/31/18 2201    Nat Christen, MD 06/02/18 1328

## 2018-06-01 DIAGNOSIS — F329 Major depressive disorder, single episode, unspecified: Secondary | ICD-10-CM

## 2018-06-01 DIAGNOSIS — K859 Acute pancreatitis without necrosis or infection, unspecified: Secondary | ICD-10-CM

## 2018-06-01 DIAGNOSIS — I10 Essential (primary) hypertension: Secondary | ICD-10-CM

## 2018-06-01 DIAGNOSIS — N289 Disorder of kidney and ureter, unspecified: Secondary | ICD-10-CM

## 2018-06-01 LAB — COMPREHENSIVE METABOLIC PANEL
ALT: 27 U/L (ref 0–44)
AST: 19 U/L (ref 15–41)
Albumin: 3.1 g/dL — ABNORMAL LOW (ref 3.5–5.0)
Alkaline Phosphatase: 67 U/L (ref 38–126)
Anion gap: 7 (ref 5–15)
BUN: 20 mg/dL (ref 8–23)
CALCIUM: 7.8 mg/dL — AB (ref 8.9–10.3)
CHLORIDE: 102 mmol/L (ref 98–111)
CO2: 30 mmol/L (ref 22–32)
CREATININE: 1.18 mg/dL (ref 0.61–1.24)
GFR calc Af Amer: >60 mL/min (ref >60)
Glucose, Bld: 94 mg/dL (ref 70–99)
Potassium: 3.8 mmol/L (ref 3.5–5.1)
Sodium: 139 mmol/L (ref 135–145)
Total Bilirubin: 2.4 mg/dL — ABNORMAL HIGH (ref 0.3–1.2)
Total Protein: 5.6 g/dL — ABNORMAL LOW (ref 6.5–8.1)

## 2018-06-01 LAB — URINALYSIS, ROUTINE W REFLEX MICROSCOPIC
BACTERIA UA: NONE SEEN
Bilirubin Urine: NEGATIVE
GLUCOSE, UA: NEGATIVE mg/dL
Ketones, ur: NEGATIVE mg/dL
Leukocytes, UA: NEGATIVE
NITRITE: NEGATIVE
PH: 7 (ref 5.0–8.0)
Protein, ur: NEGATIVE mg/dL
SPECIFIC GRAVITY, URINE: 1.029 (ref 1.005–1.030)

## 2018-06-01 LAB — CBC
HCT: 44.4 % (ref 39.0–52.0)
Hemoglobin: 15.3 g/dL (ref 13.0–17.0)
MCH: 31.7 pg (ref 26.0–34.0)
MCHC: 34.5 g/dL (ref 30.0–36.0)
MCV: 92.1 fL (ref 78.0–100.0)
PLATELETS: 225 10*3/uL (ref 150–400)
RBC: 4.82 MIL/uL (ref 4.22–5.81)
RDW: 14.1 % (ref 11.5–15.5)
WBC: 16 10*3/uL — AB (ref 4.0–10.5)

## 2018-06-01 LAB — TRIGLYCERIDES: Triglycerides: 219 mg/dL — ABNORMAL HIGH (ref <150)

## 2018-06-01 LAB — LIPASE, BLOOD: Lipase: 209 U/L — ABNORMAL HIGH (ref 11–51)

## 2018-06-01 MED ORDER — LEVOTHYROXINE SODIUM 100 MCG IV SOLR
37.5000 ug | Freq: Every day | INTRAVENOUS | Status: DC
  Administered 2018-06-01 – 2018-06-02 (×2): 37.5 ug via INTRAVENOUS
  Filled 2018-06-01 (×4): qty 5

## 2018-06-01 MED ORDER — OXYCODONE HCL 5 MG PO TABS
5.0000 mg | ORAL_TABLET | Freq: Four times a day (QID) | ORAL | Status: DC | PRN
  Administered 2018-06-01 – 2018-06-02 (×2): 5 mg via ORAL
  Filled 2018-06-01 (×2): qty 1

## 2018-06-01 MED ORDER — HYDROMORPHONE HCL 1 MG/ML IJ SOLN
1.0000 mg | INTRAMUSCULAR | Status: DC | PRN
  Administered 2018-06-01 (×2): 1 mg via INTRAVENOUS
  Filled 2018-06-01 (×2): qty 1

## 2018-06-01 MED ORDER — SODIUM CHLORIDE 0.9 % IV SOLN
INTRAVENOUS | Status: AC
  Administered 2018-06-01 (×2): via INTRAVENOUS

## 2018-06-01 NOTE — Care Management Important Message (Signed)
Important Message  Patient Details  Name: Joel Kim MRN: [id number] Date of Birth: 1946/04/02   Medicare Important Message Given:  Yes    [name] 06/01/2018, 11:37 AM

## 2018-06-01 NOTE — Progress Notes (Signed)
PROGRESS NOTE    Joel Kim  IDP:[id number] DOB: [birth year] DOA: 05/31/2018 PCP: [name], MD     Brief Narrative:  72 y.o. male with medical history significant for asthma, hypertension, and hypothyroidism, now presenting to the emergency department for evaluation of severe abdominal pain and nausea.  Patient was recently suffering from an acute respiratory illness, was diagnosed with bronchitis, and has just completed treatment with azithromycin, doxycycline, and prednisone with resolution in those symptoms.  He developed severe abdominal pain yesterday with severe nausea.  Pain was waxing and waning initially, but has been constantly severe today.  He also reports severe nausea without vomiting or diarrhea.  Denies fevers or chills in the past couple days.  He also denies chest pain or shortness of breath at this time.  He denies any recent alcohol use and reports that he underwent cholecystectomy approximately 25 years ago.   Assessment & Plan: 1-acute pancreatitis: Most likely associated with transient passage of a gallstone.  Patient with a prior history of cholecystectomy. -Glycerides level 219 -Lipase level trending down and overall feeling better. -Will slowly advance diet and continue as needed antiemetics and analgesics. -Continue gentle IV fluid resuscitation.  2-Hypertension -Stable overall. -Continue as needed antihypertensive agent through his veins -If able to tolerate diet will resume oral antihypertensive meds as per home regimen.   3-Hypothyroidism -continue synthroid   4-Mild renal insufficiency -Prerenal in nature -Continue gentle fluid resuscitation -Creatinine trending now -Repeat CMET in am.  5-depression -mood stable -denies SI or hallucinations -resume home meds once able to fully tolerate PO's   DVT prophylaxis: Heparin Code Status: Full code Family Communication: Wife at bedside Disposition Plan: Remains inpatient, continue with  gentle fluid resuscitation, continue as needed antiemetics and analgesics.  Advance diet slowly and follow LFTs and lipase trend.  Consultants:   None  Procedures:   See below for x-ray reports  Antimicrobials:  Anti-infectives (From admission, onward)   None      Subjective: Afebrile, no chest pain, no shortness of breath.  Patient still reporting abdominal pain (even improved); no nausea, no vomiting.  Willing to have his diet advanced, but expressed no appetite.   Objective: Vitals:   05/31/18 1859 05/31/18 2257 06/01/18 0000 06/01/18 0537  BP: (!) 127/96 (!) 128/93 131/74 127/79  Pulse: 98 90  94  Resp: 16 18 20    Temp: 98.7 F (37.1 C)  98.8 F (37.1 C) 98.5 F (36.9 C)  TempSrc: Oral  Oral Oral  SpO2: 96% 91% 99% 92%  Weight:   82.6 kg 82.6 kg  Height:   5\' 5"  (1.651 m)     Intake/Output Summary (Last 24 hours) at 06/01/2018 1150 Last data filed at 06/01/2018 1100 Gross per 24 hour  Intake 943.42 ml  Output 400 ml  Net 543.42 ml   Filed Weights   06/01/18 0000 06/01/18 0537  Weight: 82.6 kg 82.6 kg    Examination: General exam: Alert, awake, oriented x 3; patient denies any further nausea and had not experienced any vomiting.  Reports improvement in his abdominal pain, even still present and requiring IV pain meds.  Respiratory system: Clear to auscultation. Respiratory effort normal. Cardiovascular system:RRR. No murmurs, rubs, gallops. Gastrointestinal system: Abdomen is nondistended, soft and tender to palpation on epigastric area. No organomegaly or masses felt. Normal bowel sounds heard. Central nervous system: Alert and oriented. No focal neurological deficits. Extremities: No C/C/E, +pedal pulses Skin: No rashes, lesions or ulcers Psychiatry: Judgement and insight appear  normal. Mood & affect appropriate.    Data Reviewed: I have personally reviewed following labs and imaging studies  CBC: Recent Labs  Lab 05/31/18 1932 06/01/18 0453  WBC  19.5* 16.0*  HGB 17.4* 15.3  HCT 49.2 44.4  MCV 91.3 92.1  PLT 235 638   Basic Metabolic Panel: Recent Labs  Lab 05/31/18 1932 06/01/18 0453  NA 134* 139  K 3.6 3.8  CL 94* 102  CO2 31 30  GLUCOSE 108* 94  BUN 25* 20  CREATININE 1.41* 1.18  CALCIUM 8.6* 7.8*   GFR: Estimated Creatinine Clearance: 56.8 mL/min (by C-G formula based on SCr of 1.18 mg/dL).   Liver Function Tests: Recent Labs  Lab 05/31/18 1932 06/01/18 0453  AST 26 19  ALT 37 27  ALKPHOS 80 67  BILITOT 2.1* 2.4*  PROT 6.6 5.6*  ALBUMIN 3.7 3.1*   Recent Labs  Lab 05/31/18 1932 06/01/18 0453  LIPASE 370* 209*   Lipid Profile: Recent Labs    06/01/18 0453  TRIG 219*   Urine analysis:    Component Value Date/Time   COLORURINE STRAW (A) 05/31/2018 2339   APPEARANCEUR CLEAR 05/31/2018 2339   LABSPEC 1.029 05/31/2018 2339   PHURINE 7.0 05/31/2018 2339   GLUCOSEU NEGATIVE 05/31/2018 2339   HGBUR LARGE (A) 05/31/2018 2339   BILIRUBINUR NEGATIVE 05/31/2018 2339   KETONESUR NEGATIVE 05/31/2018 2339   PROTEINUR NEGATIVE 05/31/2018 2339   UROBILINOGEN 0.2 11/06/2010 1951   NITRITE NEGATIVE 05/31/2018 2339   LEUKOCYTESUR NEGATIVE 05/31/2018 2339    Radiology Studies: Ct Abdomen Pelvis W Contrast  Result Date: 05/31/2018 CLINICAL DATA:  Generalized abdominal pain radiating to the back and beginning 2 days ago. Nausea. EXAM: CT ABDOMEN AND PELVIS WITH CONTRAST TECHNIQUE: Multidetector CT imaging of the abdomen and pelvis was performed using the standard protocol following bolus administration of intravenous contrast. CONTRAST:  172mL ISOVUE-300 IOPAMIDOL (ISOVUE-300) INJECTION 61% COMPARISON:  07/23/2015 FINDINGS: Lower chest: Atelectasis in the lung bases. Calcification in the coronary arteries and aortic valve. Hepatobiliary: No focal liver abnormality is seen. Status post cholecystectomy. No biliary dilatation. Pancreas: Infiltration and edema around the head, body, and tail of the pancreas. No  loculated fluid collections. No pancreatic ductal dilatation. Homogeneous enhancement of the pancreatic parenchyma. No vascular thrombus. Changes are consistent with acute pancreatitis. Spleen: Normal in size without focal abnormality. Adrenals/Urinary Tract: Adrenal glands are unremarkable. Small bilateral renal cysts. Kidneys are otherwise normal, without renal calculi, focal solid lesion, or hydronephrosis. Bladder is unremarkable. Stomach/Bowel: Stomach is decompressed. There is mild wall thickening and hyperemia of the duodenal bulb and proximal duodenum. This could represent reactive inflammation or peptic ulcer. Small bowel are mostly decompressed. Scattered stool throughout the colon without abnormal distention. Scattered colonic diverticula. Appendix is normal. Vascular/Lymphatic: Aortic atherosclerosis. No enlarged abdominal or pelvic lymph nodes. Reproductive: Prostate gland is surgically absent. Other: Free fluid in the upper abdomen, extending along the pericolic gutters to the pelvis. This is likely reactive. No free air. Abdominal wall musculature appears intact. Musculoskeletal: Degenerative changes in the spine. Postoperative posterior fixation at L4-5. No destructive bone lesions. IMPRESSION: 1. Infiltration and edema around the head, body, and tail of the pancreas consistent with acute pancreatitis. No loculated fluid collections. No evidence of pancreatic necrosis or vascular thrombus. 2. Wall thickening and hyperemia of the duodenal bulb and proximal duodenum may indicate reactive inflammation or peptic ulcer disease. 3. Free fluid in the abdomen and pelvis is likely reactive. 4. Atelectasis in the lung bases. Aortic Atherosclerosis (  ICD10-I70.0). Electronically Signed   By: Lucienne Capers M.D.   On: 05/31/2018 22:32    Scheduled Meds: . heparin  5,000 Units Subcutaneous Q8H   Continuous Infusions: . sodium chloride       LOS: 1 day    Time spent: 35 minutes    Barton Dubois,  MD [facility] Pager [phone]  If 7PM-7AM, please contact night-coverage [email or web address] Password [email or web address] 06/01/2018, 11:50 AM

## 2018-06-02 DIAGNOSIS — E039 Hypothyroidism, unspecified: Secondary | ICD-10-CM

## 2018-06-02 LAB — COMPREHENSIVE METABOLIC PANEL
ALK PHOS: 58 U/L (ref 38–126)
ALT: 21 U/L (ref 0–44)
ANION GAP: 6 (ref 5–15)
AST: 16 U/L (ref 15–41)
Albumin: 2.9 g/dL — ABNORMAL LOW (ref 3.5–5.0)
BILIRUBIN TOTAL: 2.1 mg/dL — AB (ref 0.3–1.2)
BUN: 13 mg/dL (ref 8–23)
CO2: 30 mmol/L (ref 22–32)
Calcium: 7.6 mg/dL — ABNORMAL LOW (ref 8.9–10.3)
Chloride: 103 mmol/L (ref 98–111)
Creatinine, Ser: 1.08 mg/dL (ref 0.61–1.24)
GFR calc Af Amer: >60 mL/min (ref >60)
GFR calc non Af Amer: >60 mL/min (ref >60)
GLUCOSE: 103 mg/dL — AB (ref 70–99)
POTASSIUM: 3.9 mmol/L (ref 3.5–5.1)
SODIUM: 139 mmol/L (ref 135–145)
TOTAL PROTEIN: 5.4 g/dL — AB (ref 6.5–8.1)

## 2018-06-02 LAB — LIPASE, BLOOD: Lipase: 192 U/L — ABNORMAL HIGH (ref 11–51)

## 2018-06-02 MED ORDER — ONDANSETRON 8 MG PO TBDP: 8.0000 mg | ORAL_TABLET | Freq: Three times a day (TID) | ORAL | 0 refills | Status: DC | PRN

## 2018-06-02 MED ORDER — OXYCODONE HCL 20 MG PO TABS: 1.0000 | ORAL_TABLET | Freq: Three times a day (TID) | ORAL | 0 refills | Status: DC | PRN

## 2018-06-02 NOTE — Progress Notes (Signed)
Patient discharged home today per MD orders. Patient vital signs WDL. IV removed and site WDL. Discharge Instructions including follow up appointments, medications, and education reviewed with patient. Patient verbalizes understanding. Patient is transported out via wheelchair.  

## 2018-06-02 NOTE — Discharge Instructions (Signed)
Low-Fat Diet for Pancreatitis or Gallbladder Conditions A low-fat diet can be helpful if you have pancreatitis or a gallbladder condition. With these conditions, your pancreas and gallbladder have trouble digesting fats. A healthy eating plan with less fat will help rest your pancreas and gallbladder and reduce your symptoms. What do I need to know about this diet?  Eat a low-fat diet. ? Reduce your fat intake to less than 20-30% of your total daily calories. This is less than 50-60 g of fat per day. ? Remember that you need some fat in your diet. Ask your dietician what your daily goal should be. ? Choose nonfat and low-fat healthy foods. Look for the words "nonfat," "low fat," or "fat free." ? As a guide, look on the label and choose foods with less than 3 g of fat per serving. Eat only one serving.  Avoid alcohol.  Do not smoke. If you need help quitting, talk with your health care provider.  Eat small frequent meals instead of three large heavy meals. What foods can I eat? Grains Include healthy grains and starches such as potatoes, wheat bread, fiber-rich cereal, and brown rice. Choose whole grain options whenever possible. In adults, whole grains should account for 45-65% of your daily calories. Fruits and Vegetables Eat plenty of fruits and vegetables. Fresh fruits and vegetables add fiber to your diet. Meats and Other Protein Sources Eat lean meat such as chicken and pork. Trim any fat off of meat before cooking it. Eggs, fish, and beans are other sources of protein. In adults, these foods should account for 10-35% of your daily calories. Dairy Choose low-fat milk and dairy options. Dairy includes fat and protein, as well as calcium. Fats and Oils Limit high-fat foods such as fried foods, sweets, baked goods, sugary drinks. Other Creamy sauces and condiments, such as mayonnaise, can add extra fat. Think about whether or not you need to use them, or use smaller amounts or low fat  options. What foods are not recommended?  High fat foods, such as: ? Baked goods. ? Ice cream. ? French toast. ? Sweet rolls. ? Pizza. ? Cheese bread. ? Foods covered with batter, butter, creamy sauces, or cheese. ? Fried foods. ? Sugary drinks and desserts.  Foods that cause gas or bloating This information is not intended to replace advice given to you by your health care provider. Make sure you discuss any questions you have with your health care provider. Document Released: 09/17/2013 Document Revised: 02/18/2016 Document Reviewed: 08/26/2013 Elsevier Interactive Patient Education  2017 [employer]  

## 2018-06-02 NOTE — Discharge Summary (Signed)
Physician Discharge Summary  Joel Kim GYI:[id number] DOB: 1946/06/19 DOA: 05/31/2018  PCP: [name], MD  Admit date: 05/31/2018 Discharge date: 06/02/2018  Time spent:  35 minutes  Recommendations for Outpatient Follow-up:  1. Repeat basic metabolic panel to follow electrolytes and renal function. 2. Outpatient follow-up with gastroenterology to further evaluate the need of endoscopic ultrasound to assess for pancreatitis causes.   Discharge Diagnoses:  Principal Problem:   Acute pancreatitis Active Problems:   Hypertension   Hypothyroidism   Mild renal insufficiency Chronic pain syndrome Depression  Discharge Condition: Stable and improved.  Discharge home with instruction to follow-up with PCP in 10 days.  Diet recommendation: Heart healthy diet  Filed Weights   06/01/18 0000 06/01/18 0537 06/02/18 3500  Weight: 82.6 kg 82.6 kg 83.7 kg    History of present illness:  As per H&P written by Dr. Myna Kim on 05/31/2018 71 y.o.malewith medical history significant forasthma, hypertension, and hypothyroidism, now presenting to the emergency department for evaluation of severe abdominal pain and nausea. Patient was recently suffering from an acute respiratory illness, was diagnosed with bronchitis, and has just completed treatment with azithromycin, doxycycline, and prednisone with resolution in those symptoms. He developed severe abdominal pain yesterday with severe nausea. Pain was waxing and waning initially, but has been constantly severe today. He also reports severe nausea without vomiting or diarrhea. Denies fevers or chills in the past couple days. He also denies chest pain or shortness of breath at this time. He denies any recent alcohol use and reports that he underwent cholecystectomy approximately 25 years ago.  Hospital Course:  1-acute pancreatitis: Most likely associated with transient passage of a gallstone.  Patient with a prior history of  cholecystectomy. -Glycerides level 219 -Lipase level trended down appropriately. -Tolerating diet at discharge -no cause identified; will recommend follow up with GI as an outpatient for potential EGD [place] and further evaluation.  -Patient advised to keep himself well-hydrated.  2-Hypertension -Stable overall. -Resume home antihypertensive regimen -Patient advised to follow heart healthy diet.   3-Hypothyroidism -continue synthroid   4-Mild renal insufficiency -Prerenal in nature -Resolved with fluid resuscitation. -Creatinine back to within normal limits at discharge. -Repeat basic metabolic panel follow-up visit to reassess renal function and electrolytes.  5-depression -mood stable -denies SI or hallucinations -Continue home psychiatry medications regimen  6-chronic pain syndrome -Overall stable -Continue as needed pain medication  Procedures:  See below for x-ray reports.  Consultations:  None   Discharge Exam: Vitals:   06/01/18 2214 06/02/18 0633  BP: (!) 118/59 126/72  Pulse: 99 91  Resp: 18   Temp: 98.7 F (37.1 C) 98.2 F (36.8 C)  SpO2: 93% 95%   General exam: Alert, awake, oriented x 3;  no chest pain, no shortness of breath, no further nausea or vomiting.  Still with mild discomfort in his abdomen but significantly improved in comparison to admission symptoms. Respiratory system: Clear to auscultation. Respiratory effort normal. Cardiovascular system:RRR. No murmurs, rubs, gallops. Gastrointestinal system:  Abdomen is nondistended, soft, no guarding, mild discomfort in the epigastric area with deep palpation.  Positive bowel sounds. Central nervous system: Alert and oriented. No focal neurological deficits. Extremities: No C/C/E, +pedal pulses Skin: No rashes, lesions or ulcers Psychiatry: Judgement and insight appear normal. Mood & affect appropriate.    Discharge Instructions   Discharge Instructions    Diet - low sodium heart healthy    Complete by:  As directed    Discharge instructions   Complete by:  As directed    Heart healthy low-fat diet Maintain adequate hydration Follow-up with PCP in 10 days   Increase activity slowly   Complete by:  As directed      Allergies as of 06/02/2018      Reactions   Tapentadol Rash   Breaks patient out  Breaks patient out    Paroxetine Hcl Other (See Comments)   Irritability  Irritability    Statins Other (See Comments)   Muscle aches   Adhesive [tape]    Breaks patient out    Ciprofloxacin Hives   Flagyl [metronidazole]    Reclast [zoledronic Acid]       Medication List    STOP taking these medications   doxycycline 100 MG tablet Commonly known as:  VIBRA-TABS   predniSONE 20 MG tablet Commonly known as:  DELTASONE     TAKE these medications   albuterol 108 (90 Base) MCG/ACT inhaler Commonly known as:  PROVENTIL HFA;VENTOLIN HFA Inhale 2 puffs into the lungs every 6 (six) hours as needed.   amLODipine 5 MG tablet Commonly known as:  NORVASC Take 5 mg by mouth daily.   baclofen 10 MG tablet Commonly known as:  LIORESAL Take 10 mg by mouth 3 (three) times daily.   cetirizine 10 MG tablet Commonly known as:  ZYRTEC Take 10 mg by mouth at bedtime.   FISH OIL ULTRA 1000 MG Caps Take 1,000 mg by mouth at bedtime.   gabapentin 300 MG capsule Commonly known as:  NEURONTIN Take 300-600 mg by mouth at bedtime.   hydrochlorothiazide 25 MG tablet Commonly known as:  HYDRODIURIL Take 25 mg by mouth daily.   levothyroxine 75 MCG tablet Commonly known as:  SYNTHROID, LEVOTHROID Take 75 mcg by mouth daily before breakfast.   losartan 100 MG tablet Commonly known as:  COZAAR Take 100 mg by mouth daily.   metoprolol succinate 50 MG 24 hr tablet Commonly known as:  TOPROL-XL Take 50 mg by mouth at bedtime. Take with or immediately following a meal.   nortriptyline 25 MG capsule Commonly known as:  PAMELOR Take 25 mg by mouth at bedtime.   ondansetron  8 MG disintegrating tablet Commonly known as:  ZOFRAN-ODT Take 1 tablet (8 mg total) by mouth every 8 (eight) hours as needed for nausea or vomiting.   Oxycodone HCl 20 MG Tabs Take 1 tablet (20 mg total) by mouth 3 (three) times daily as needed.   pantoprazole 40 MG tablet Commonly known as:  PROTONIX Take 1 tablet (40 mg total) by mouth daily.   TESTOSTERONE CYPIONATE IM Inject 1 mL into the muscle every 14 (fourteen) days. Inject 1 ml every 14 days   traZODone 50 MG tablet Commonly known as:  DESYREL Take 50 mg by mouth at bedtime.   Vitamin D3 1000 units Caps Take 1,000 Units by mouth daily.      Allergies  Allergen Reactions  . Tapentadol Rash    Breaks patient out  Breaks patient out    . Paroxetine Hcl Other (See Comments)    Irritability  Irritability    . Statins Other (See Comments)    Muscle aches  . Adhesive [Tape]     Breaks patient out   . Ciprofloxacin Hives  . Flagyl [Metronidazole]   . Reclast [Zoledronic Acid]    Follow-up Information    [name], MD. Schedule an appointment as soon as possible for a visit in 10 day(s).   Specialty:  Family Medicine Contact information:  [place] [address] [phone]           The results of significant diagnostics from this hospitalization (including imaging, microbiology, ancillary and laboratory) are listed below for reference.    Significant Diagnostic Studies: Dg Chest 2 View  Result Date: 05/12/2018 CLINICAL DATA:  Patient with sore throat.  Malaise. EXAM: CHEST - 2 VIEW COMPARISON:  Chest radiograph 08/13/2015 FINDINGS: Stable cardiac and mediastinal contours. Tortuosity of the thoracic aorta. No consolidative pulmonary opacities. No pleural effusion or pneumothorax. Thoracic spine degenerative changes. Cholecystectomy clips. IMPRESSION: No acute cardiopulmonary process. Electronically Signed   By: Lovey Newcomer M.D.   On: 05/12/2018 23:15   Ct Abdomen Pelvis W  Contrast  Result Date: 05/31/2018 CLINICAL DATA:  Generalized abdominal pain radiating to the back and beginning 2 days ago. Nausea. EXAM: CT ABDOMEN AND PELVIS WITH CONTRAST TECHNIQUE: Multidetector CT imaging of the abdomen and pelvis was performed using the standard protocol following bolus administration of intravenous contrast. CONTRAST:  187mL ISOVUE-300 IOPAMIDOL (ISOVUE-300) INJECTION 61% COMPARISON:  07/23/2015 FINDINGS: Lower chest: Atelectasis in the lung bases. Calcification in the coronary arteries and aortic valve. Hepatobiliary: No focal liver abnormality is seen. Status post cholecystectomy. No biliary dilatation. Pancreas: Infiltration and edema around the head, body, and tail of the pancreas. No loculated fluid collections. No pancreatic ductal dilatation. Homogeneous enhancement of the pancreatic parenchyma. No vascular thrombus. Changes are consistent with acute pancreatitis. Spleen: Normal in size without focal abnormality. Adrenals/Urinary Tract: Adrenal glands are unremarkable. Small bilateral renal cysts. Kidneys are otherwise normal, without renal calculi, focal solid lesion, or hydronephrosis. Bladder is unremarkable. Stomach/Bowel: Stomach is decompressed. There is mild wall thickening and hyperemia of the duodenal bulb and proximal duodenum. This could represent reactive inflammation or peptic ulcer. Small bowel are mostly decompressed. Scattered stool throughout the colon without abnormal distention. Scattered colonic diverticula. Appendix is normal. Vascular/Lymphatic: Aortic atherosclerosis. No enlarged abdominal or pelvic lymph nodes. Reproductive: Prostate gland is surgically absent. Other: Free fluid in the upper abdomen, extending along the pericolic gutters to the pelvis. This is likely reactive. No free air. Abdominal wall musculature appears intact. Musculoskeletal: Degenerative changes in the spine. Postoperative posterior fixation at L4-5. No destructive bone lesions.  IMPRESSION: 1. Infiltration and edema around the head, body, and tail of the pancreas consistent with acute pancreatitis. No loculated fluid collections. No evidence of pancreatic necrosis or vascular thrombus. 2. Wall thickening and hyperemia of the duodenal bulb and proximal duodenum may indicate reactive inflammation or peptic ulcer disease. 3. Free fluid in the abdomen and pelvis is likely reactive. 4. Atelectasis in the lung bases. Aortic Atherosclerosis (ICD10-I70.0). Electronically Signed   By: Lucienne Capers M.D.   On: 05/31/2018 22:32    Microbiology: No results found for this or any previous visit (from the past 240 hour(s)).   Labs: Basic Metabolic Panel: Recent Labs  Lab 05/31/18 1932 06/01/18 0453 06/02/18 0516  NA 134* 139 139  K 3.6 3.8 3.9  CL 94* 102 103  CO2 31 30 30   GLUCOSE 108* 94 103*  BUN 25* 20 13  CREATININE 1.41* 1.18 1.08  CALCIUM 8.6* 7.8* 7.6*   Liver Function Tests: Recent Labs  Lab 05/31/18 1932 06/01/18 0453 06/02/18 0516  AST 26 19 16   ALT 37 27 21  ALKPHOS 80 67 58  BILITOT 2.1* 2.4* 2.1*  PROT 6.6 5.6* 5.4*  ALBUMIN 3.7 3.1* 2.9*   Recent Labs  Lab 05/31/18 1932 06/01/18 0453 06/02/18 0516  LIPASE  370* 209* 192*   CBC: Recent Labs  Lab 05/31/18 1932 06/01/18 0453  WBC 19.5* 16.0*  HGB 17.4* 15.3  HCT 49.2 44.4  MCV 91.3 92.1  PLT 235 225    Signed:  Barton Dubois MD.  [facility] 06/02/2018, 10:15 AM

## 2018-06-12 ENCOUNTER — Other Ambulatory Visit: Payer: Self-pay | Admitting: Gastroenterology

## 2018-06-12 DIAGNOSIS — K858 Other acute pancreatitis without necrosis or infection: Secondary | ICD-10-CM

## 2018-06-17 ENCOUNTER — Ambulatory Visit
Admission: RE | Admit: 2018-06-17 | Discharge: 2018-06-17 | Disposition: A | Discharge status: Home / Self Care | Payer: Medicare HMO | Source: Ambulatory Visit | Attending: Gastroenterology | Admitting: Gastroenterology

## 2018-06-17 DIAGNOSIS — K858 Other acute pancreatitis without necrosis or infection: Secondary | ICD-10-CM

## 2018-06-17 MED ORDER — GADOBENATE DIMEGLUMINE 529 MG/ML IV SOLN
18.0000 mL | Freq: Once | INTRAVENOUS | Status: AC | PRN
  Administered 2018-06-17: 18 mL via INTRAVENOUS

## 2018-08-10 ENCOUNTER — Emergency Department (HOSPITAL_COMMUNITY): Payer: Medicare HMO

## 2018-08-10 ENCOUNTER — Encounter (HOSPITAL_COMMUNITY): Payer: Self-pay

## 2018-08-10 ENCOUNTER — Other Ambulatory Visit: Payer: Self-pay

## 2018-08-10 ENCOUNTER — Emergency Department (HOSPITAL_COMMUNITY)
Admission: EM | Admit: 2018-08-10 | Discharge: 2018-08-10 | Disposition: A | Discharge status: Home / Self Care | Payer: Medicare HMO | Attending: Emergency Medicine | Admitting: Emergency Medicine

## 2018-08-10 DIAGNOSIS — E039 Hypothyroidism, unspecified: Secondary | ICD-10-CM | POA: Insufficient documentation

## 2018-08-10 DIAGNOSIS — I10 Essential (primary) hypertension: Secondary | ICD-10-CM | POA: Insufficient documentation

## 2018-08-10 DIAGNOSIS — E119 Type 2 diabetes mellitus without complications: Secondary | ICD-10-CM | POA: Diagnosis not present

## 2018-08-10 DIAGNOSIS — Z859 Personal history of malignant neoplasm, unspecified: Secondary | ICD-10-CM | POA: Diagnosis not present

## 2018-08-10 DIAGNOSIS — Z79899 Other long term (current) drug therapy: Secondary | ICD-10-CM | POA: Insufficient documentation

## 2018-08-10 DIAGNOSIS — R05 Cough: Secondary | ICD-10-CM | POA: Diagnosis not present

## 2018-08-10 DIAGNOSIS — R0789 Other chest pain: Secondary | ICD-10-CM | POA: Diagnosis not present

## 2018-08-10 DIAGNOSIS — Z8673 Personal history of transient ischemic attack (TIA), and cerebral infarction without residual deficits: Secondary | ICD-10-CM | POA: Insufficient documentation

## 2018-08-10 DIAGNOSIS — R079 Chest pain, unspecified: Secondary | ICD-10-CM | POA: Diagnosis present

## 2018-08-10 DIAGNOSIS — R071 Chest pain on breathing: Secondary | ICD-10-CM | POA: Diagnosis not present

## 2018-08-10 HISTORY — DX: Type 2 diabetes mellitus without complications: E11.9

## 2018-08-10 HISTORY — DX: Acute pancreatitis without necrosis or infection, unspecified: K85.90

## 2018-08-10 LAB — DIFFERENTIAL
ABS IMMATURE GRANULOCYTES: 0.03 10*3/uL (ref 0.00–0.07)
BASOS ABS: 0.1 10*3/uL (ref 0.0–0.1)
BASOS PCT: 1 %
Eosinophils Absolute: 0.3 10*3/uL (ref 0.0–0.5)
Eosinophils Relative: 3 %
Immature Granulocytes: 0 %
Lymphocytes Relative: 27 %
Lymphs Abs: 2.9 10*3/uL (ref 0.7–4.0)
MONO ABS: 0.7 10*3/uL (ref 0.1–1.0)
MONOS PCT: 6 %
NEUTROS ABS: 7 10*3/uL (ref 1.7–7.7)
NEUTROS PCT: 63 %

## 2018-08-10 LAB — CBC
HEMATOCRIT: 48.3 % (ref 39.0–52.0)
HEMOGLOBIN: 16.4 g/dL (ref 13.0–17.0)
MCH: 30.4 pg (ref 26.0–34.0)
MCHC: 34 g/dL (ref 30.0–36.0)
MCV: 89.6 fL (ref 80.0–100.0)
NRBC: 0 % (ref 0.0–0.2)
Platelets: 234 10*3/uL (ref 150–400)
RBC: 5.39 MIL/uL (ref 4.22–5.81)
RDW: 13.1 % (ref 11.5–15.5)
WBC: 11.1 10*3/uL — AB (ref 4.0–10.5)

## 2018-08-10 LAB — HEPATIC FUNCTION PANEL
ALK PHOS: 89 U/L (ref 38–126)
ALT: 19 U/L (ref 0–44)
AST: 22 U/L (ref 15–41)
Albumin: 4 g/dL (ref 3.5–5.0)
BILIRUBIN TOTAL: 1.3 mg/dL — AB (ref 0.3–1.2)
Bilirubin, Direct: 0.1 mg/dL (ref 0.0–0.2)
Indirect Bilirubin: 1.2 mg/dL — ABNORMAL HIGH (ref 0.3–0.9)
Total Protein: 6.9 g/dL (ref 6.5–8.1)

## 2018-08-10 LAB — BASIC METABOLIC PANEL
ANION GAP: 7 (ref 5–15)
BUN: 11 mg/dL (ref 8–23)
CHLORIDE: 102 mmol/L (ref 98–111)
CO2: 32 mmol/L (ref 22–32)
Calcium: 9.8 mg/dL (ref 8.9–10.3)
Creatinine, Ser: 1.18 mg/dL (ref 0.61–1.24)
Glucose, Bld: 98 mg/dL (ref 70–99)
Potassium: 3.5 mmol/L (ref 3.5–5.1)
SODIUM: 141 mmol/L (ref 135–145)

## 2018-08-10 LAB — D-DIMER, QUANTITATIVE: D-Dimer, Quant: 0.3 ug/mL-FEU (ref 0.00–0.50)

## 2018-08-10 LAB — TROPONIN I

## 2018-08-10 MED ORDER — HYDROCODONE-ACETAMINOPHEN 5-325 MG PO TABS: 1.0000 | ORAL_TABLET | Freq: Four times a day (QID) | ORAL | 0 refills | Status: DC | PRN

## 2018-08-10 NOTE — Discharge Instructions (Addendum)
Follow-up with your family doctor next week for recheck. 

## 2018-08-10 NOTE — ED Notes (Signed)
Patient transported to X-ray 

## 2018-08-10 NOTE — ED Triage Notes (Signed)
Pt reports that he woke up with chest tightness this morning as well as HA and "eyes feel funny". EMS responded to house and EKG was normal and BP was slightly elevated

## 2018-08-10 NOTE — ED Provider Notes (Signed)
[facility] EMERGENCY DEPARTMENT Provider Note   CSN: [id number] Arrival date & time: 08/10/18  1254     History   Chief Complaint Chief Complaint  Patient presents with  . Chest Pain    HPI Joel Kim is a 72 y.o. male.  Patient complains of right-sided chest pain is worse with cough and deep breath.  Patient states he did not have any shortness of breath or wheezing.  The history is provided by the patient. No language interpreter was used.  Chest Pain   This is a new problem. The current episode started 12 to 24 hours ago. The problem occurs rarely. The problem has been gradually improving. The pain is associated with breathing. The pain is present in the lateral region. The pain is at a severity of 3/10. The pain is moderate. The quality of the pain is described as brief. The pain does not radiate. Pertinent negatives include no abdominal pain, no back pain, no cough and no headaches.  Pertinent negatives for past medical history include no seizures.    Past Medical History:  Diagnosis Date  . Asthma   . Cancer ([place])   . Diabetes mellitus without complication ([place])   . Hypertension   . Pancreatitis   . Thyroid disease   . TIA (transient ischemic attack)     Patient Active Problem List   Diagnosis Date Noted  . Hypertension 05/31/2018  . Hypothyroidism 05/31/2018  . Acute pancreatitis 05/31/2018  . Mild renal insufficiency 05/31/2018    Past Surgical History:  Procedure Laterality Date  . BACK SURGERY    . CHOLECYSTECTOMY    . FOOT SURGERY    . JOINT REPLACEMENT          Home Medications    Prior to Admission medications   Medication Sig Start Date End Date Taking? Authorizing Provider  albuterol (PROVENTIL HFA;VENTOLIN HFA) 108 (90 Base) MCG/ACT inhaler Inhale 2 puffs into the lungs every 6 (six) hours as needed. 05/13/18   Rancour, Annie Main, MD  amLODipine (NORVASC) 5 MG tablet Take 5 mg by mouth daily.    [provider]  baclofen  (LIORESAL) 10 MG tablet Take 10 mg by mouth 3 (three) times daily.    [provider]  cetirizine (ZYRTEC) 10 MG tablet Take 10 mg by mouth at bedtime.     [provider]  Cholecalciferol (VITAMIN D3) 1000 UNITS CAPS Take 1,000 Units by mouth daily.     [provider]  gabapentin (NEURONTIN) 300 MG capsule Take 300-600 mg by mouth at bedtime.     [provider]  hydrochlorothiazide (HYDRODIURIL) 25 MG tablet Take 25 mg by mouth daily.    [provider]  HYDROcodone-acetaminophen (NORCO/VICODIN) 5-325 MG tablet Take 1 tablet by mouth every 6 (six) hours as needed for moderate pain. 08/10/18   Milton Ferguson, MD  levothyroxine (SYNTHROID, LEVOTHROID) 75 MCG tablet Take 75 mcg by mouth daily before breakfast.    [provider]  losartan (COZAAR) 100 MG tablet Take 100 mg by mouth daily.    [provider]  metoprolol succinate (TOPROL-XL) 50 MG 24 hr tablet Take 50 mg by mouth at bedtime. Take with or immediately following a meal. 04/02/13   Chipper Herb, MD  nortriptyline (PAMELOR) 25 MG capsule Take 25 mg by mouth at bedtime.    [provider]  Omega-3 Fatty Acids (FISH OIL ULTRA) 1000 MG CAPS Take 1,000 mg by mouth at bedtime.     [provider]  ondansetron (ZOFRAN ODT) 8 MG disintegrating tablet Take 1 tablet (8 mg total) by mouth every 8 (eight) hours as needed for nausea or vomiting. 06/02/18   Barton Dubois, MD  Oxycodone HCl 20 MG TABS Take 1 tablet (20 mg total) by mouth 3 (three) times daily as needed. 06/02/18   Barton Dubois, MD  pantoprazole (PROTONIX) 40 MG tablet Take 1 tablet (40 mg total) by mouth daily. 01/21/13   Chipper Herb, MD  TESTOSTERONE CYPIONATE IM Inject 1 mL into the muscle every 14 (fourteen) days. Inject 1 ml every 14 days    [provider]  traZODone (DESYREL) 50 MG tablet Take 50 mg by mouth at bedtime.    [provider]    Family History No family history on  file.  Social History Social History   Tobacco Use  . Smoking status: Never Smoker  . Smokeless tobacco: Never Used  Substance Use Topics  . Alcohol use: No  . Drug use: No     Allergies   Tapentadol; Paroxetine hcl; Statins; Adhesive [tape]; Ciprofloxacin; Flagyl [metronidazole]; and Reclast [zoledronic acid]   Review of Systems Review of Systems  Constitutional: Negative for appetite change and fatigue.  HENT: Negative for congestion, ear discharge and sinus pressure.   Eyes: Negative for discharge.  Respiratory: Negative for cough.   Cardiovascular: Positive for chest pain.  Gastrointestinal: Negative for abdominal pain and diarrhea.  Genitourinary: Negative for frequency and hematuria.  Musculoskeletal: Negative for back pain.  Skin: Negative for rash.  Neurological: Negative for seizures and headaches.  Psychiatric/Behavioral: Negative for hallucinations.     Physical Exam Updated Vital Signs BP 138/86   Pulse 81   Temp 98 F (36.7 C) (Oral)   Resp 17   Ht 5\' 5"  (1.651 m)   Wt 81.6 kg   SpO2 93%   BMI 29.95 kg/m   Physical Exam  Constitutional: He is oriented to person, place, and time. He appears well-developed.  HENT:  Head: Normocephalic.  Eyes: Conjunctivae and EOM are normal. No scleral icterus.  Neck: Neck supple. No thyromegaly present.  Cardiovascular: Normal rate and regular rhythm. Exam reveals no gallop and no friction rub.  No murmur heard. Pulmonary/Chest: No stridor. He has no wheezes. He has no rales. He exhibits no tenderness.  Abdominal: He exhibits no distension. There is no tenderness. There is no rebound.  Musculoskeletal: Normal range of motion. He exhibits no edema.  Lymphadenopathy:    He has no cervical adenopathy.  Neurological: He is oriented to person, place, and time. He exhibits normal muscle tone. Coordination normal.  Skin: No rash noted. No erythema.  Psychiatric: He has a normal mood and affect. His behavior is  normal.     ED Treatments / Results  Labs (all labs ordered are listed, but only abnormal results are displayed) Labs Reviewed  CBC - Abnormal; Notable for the following components:      Result Value   WBC 11.1 (*)    All other components within normal limits  HEPATIC FUNCTION PANEL - Abnormal; Notable for the following components:   Total Bilirubin 1.3 (*)    Indirect Bilirubin 1.2 (*)    All other components within normal limits  BASIC METABOLIC PANEL  TROPONIN I  DIFFERENTIAL  D-DIMER, QUANTITATIVE (NOT AT [facility])    EKG EKG Interpretation  Date/Time:  Friday August 10 2018 13:04:44 EST Ventricular Rate:  65 PR Interval:  174 QRS Duration: 94 QT Interval:  386 QTC  Calculation: 401 R Axis:   73 Text Interpretation:  Normal sinus rhythm Possible Inferior infarct , age undetermined Abnormal ECG Confirmed by Milton Ferguson [phone]) on 08/10/2018 2:39:02 PM Also confirmed by Milton Ferguson [phone])  on 08/10/2018 4:57:59 PM   Radiology Dg Chest 2 View  Result Date: 08/10/2018 CLINICAL DATA:  Chest tightness this morning, headache, slightly elevated blood pressure. EXAM: CHEST - 2 VIEW COMPARISON:  Chest x-rays dated 05/12/2018 and 08/13/2015. FINDINGS: Heart size and mediastinal contours appear stable. Lungs are clear. No pleural effusion or pneumothorax seen. No acute or suspicious osseous finding. IMPRESSION: No active cardiopulmonary disease. No evidence of pneumonia or pulmonary edema. Electronically Signed   By: Franki Cabot M.D.   On: 08/10/2018 15:05    Procedures Procedures (including critical care time)  Medications Ordered in ED Medications - No data to display   Initial Impression / Assessment and Plan / ED Course  I have reviewed the triage vital signs and the nursing notes.  Pertinent labs & imaging results that were available during my care of the patient were reviewed by me and considered in my medical decision making (see chart for details).      Patient with atypical chest pain.  Pain is more pleuritic.  D-dimer normal.  Troponin normal.  EKG unremarkable.  Chest x-ray normal.  Patient will be given some pain medicine and will follow-up with his PCP doubt coronary artery disease  Final Clinical Impressions(s) / ED Diagnoses   Final diagnoses:  Atypical chest pain    ED Discharge Orders         Ordered    HYDROcodone-acetaminophen (NORCO/VICODIN) 5-325 MG tablet  Every 6 hours PRN     08/10/18 1705           Milton Ferguson, MD 08/10/18 1709

## 2018-08-15 ENCOUNTER — Encounter: Payer: Self-pay | Admitting: Cardiovascular Disease

## 2018-08-15 ENCOUNTER — Ambulatory Visit: Payer: Medicare HMO | Admitting: Cardiovascular Disease

## 2018-08-15 VITALS — BP 132/78 | HR 68 | Ht 65.0 in | Wt 188.4 lb

## 2018-08-15 DIAGNOSIS — E7800 Pure hypercholesterolemia, unspecified: Secondary | ICD-10-CM | POA: Diagnosis not present

## 2018-08-15 DIAGNOSIS — Z8249 Family history of ischemic heart disease and other diseases of the circulatory system: Secondary | ICD-10-CM | POA: Insufficient documentation

## 2018-08-15 DIAGNOSIS — E785 Hyperlipidemia, unspecified: Secondary | ICD-10-CM | POA: Insufficient documentation

## 2018-08-15 DIAGNOSIS — I10 Essential (primary) hypertension: Secondary | ICD-10-CM

## 2018-08-15 DIAGNOSIS — R0789 Other chest pain: Secondary | ICD-10-CM

## 2018-08-15 NOTE — Assessment & Plan Note (Signed)
Joel Kim was referred to me by Dr. Watt Climes for atypical chest pain.  His risk factors include treated hypertension, untreated hyperlipidemia and family history.  He said atypical constant chest pain for last several days.  He has a history of chronic pain syndrome related to fibromyalgia and pain in his legs and back.  He does have a history of treated hypertension, untreated hyperlipidemia and a family history of heart disease.  EKG shows no acute changes.  I am going to get a 2D echo and a pharmacologic Myoview stress test to further evaluate.

## 2018-08-15 NOTE — Progress Notes (Signed)
08/15/2018 Joel Kim   [birth year]  [id number]  Primary Physician Joel Noon, MD Primary Cardiologist: Joel Harp MD Joel Kim, [place]  HPI:  Joel Kim is a 72 y.o. really overweight married Caucasian male father for, grandfather of 68 grandchildren referred by Joel Kim  for cardiovascular evaluation because of atypical chest pain.  He has no prior history of heart disease.  Risk factors include treated hypertension, untreated hyperlipidemia because of statin intolerance and family history of both parents who had myocardial infarctions.  He is never had a heart attack but has had "a mini stroke in the past.  He has had pancreatitis as well as chronic pain syndrome with fibromyalgia.  He has had chronic chest pain which does not sound ischemic in nature.  Current Meds  Medication Sig  . albuterol (PROVENTIL HFA;VENTOLIN HFA) 108 (90 Base) MCG/ACT inhaler Inhale 2 puffs into the lungs every 6 (six) hours as needed.  Marland Kitchen amLODipine (NORVASC) 5 MG tablet Take 5 mg by mouth daily.  . baclofen (LIORESAL) 10 MG tablet Take 10 mg by mouth 3 (three) times daily.  . cetirizine (ZYRTEC) 10 MG tablet Take 10 mg by mouth at bedtime.   . Cholecalciferol (VITAMIN D3) 1000 UNITS CAPS Take 1,000 Units by mouth daily.   . hydrochlorothiazide (HYDRODIURIL) 25 MG tablet Take 25 mg by mouth daily.  . Levothyroxine Sodium 100 MCG CAPS Take 75 mcg by mouth daily before breakfast.   . metoprolol succinate (TOPROL-XL) 50 MG 24 hr tablet Take 50 mg by mouth at bedtime. Take with or immediately following a meal.  . nortriptyline (PAMELOR) 25 MG capsule Take 25 mg by mouth at bedtime.  . Omega-3 Fatty Acids (FISH OIL ULTRA) 1000 MG CAPS Take 1,000 mg by mouth at bedtime.   . ondansetron (ZOFRAN ODT) 8 MG disintegrating tablet Take 1 tablet (8 mg total) by mouth every 8 (eight) hours as needed for nausea or vomiting.  . Oxycodone HCl 20 MG TABS Take 1 tablet (20 mg total) by mouth 3  (three) times daily as needed.  . pantoprazole (PROTONIX) 40 MG tablet Take 1 tablet (40 mg total) by mouth daily.  . TESTOSTERONE CYPIONATE IM Inject 1 mL into the muscle every 14 (fourteen) days. Inject 1 ml every 14 days  . traZODone (DESYREL) 50 MG tablet Take 50 mg by mouth at bedtime.     Allergies  Allergen Reactions  . Tapentadol Rash    Breaks patient out  Breaks patient out    . Paroxetine Hcl Other (See Comments)    Irritability  Irritability    . Statins Other (See Comments)    Muscle aches  . Adhesive [Tape]     Breaks patient out   . Ciprofloxacin Hives  . Flagyl [Metronidazole]   . Reclast [Zoledronic Acid]     Social History   Socioeconomic History  . Marital status: Married    Spouse name: Not on file  . Number of children: Not on file  . Years of education: Not on file  . Highest education level: Not on file  Occupational History  . Not on file  Social Needs  . Financial resource strain: Not on file  . Food insecurity:    Worry: Not on file    Inability: Not on file  . Transportation needs:    Medical: Not on file    Non-medical: Not on file  Tobacco Use  . Smoking status: Never Smoker  .  Smokeless tobacco: Never Used  Substance and Sexual Activity  . Alcohol use: No  . Drug use: No  . Sexual activity: Not on file  Lifestyle  . Physical activity:    Days per week: Not on file    Minutes per session: Not on file  . Stress: Not on file  Relationships  . Social connections:    Talks on phone: Not on file    Gets together: Not on file    Attends religious service: Not on file    Active member of club or organization: Not on file    Attends meetings of clubs or organizations: Not on file    Relationship status: Not on file  . Intimate partner violence:    Fear of current or ex partner: Not on file    Emotionally abused: Not on file    Physically abused: Not on file    Forced sexual activity: Not on file  Other Topics Concern  . Not on  file  Social History Narrative  . Not on file     Review of Systems: General: negative for chills, fever, night sweats or weight changes.  Cardiovascular: negative for chest pain, dyspnea on exertion, edema, orthopnea, palpitations, paroxysmal nocturnal dyspnea or shortness of breath Dermatological: negative for rash Respiratory: negative for cough or wheezing Urologic: negative for hematuria Abdominal: negative for nausea, vomiting, diarrhea, bright red blood per rectum, melena, or hematemesis Neurologic: negative for visual changes, syncope, or dizziness All other systems reviewed and are otherwise negative except as noted above.    Blood pressure 132/78, pulse 68, height 5\' 5"  (1.651 m), weight 188 lb 6.4 oz (85.5 kg).  General appearance: alert and no distress Neck: no adenopathy, no carotid bruit, no JVD, supple, symmetrical, trachea midline and thyroid not enlarged, symmetric, no tenderness/mass/nodules Lungs: clear to auscultation bilaterally Heart: regular rate and rhythm, S1, S2 normal, no murmur, click, rub or gallop Extremities: extremities normal, atraumatic, no cyanosis or edema Pulses: 2+ and symmetric Skin: Skin color, texture, turgor normal. No rashes or lesions Neurologic: Alert and oriented X 3, normal strength and tone. Normal symmetric reflexes. Normal coordination and gait  EKG sinus rhythm at 68 with small inferior Q waves.  Personally reviewed this EKG.  ASSESSMENT AND PLAN:   Hypertension History of essential hypertension blood pressure measured today 132/78.  He is on amlodipine, metoprolol and hydrochlorothiazide.  Continue current meds at current dosing  Hyperlipidemia History of hyperlipidemia intolerant to statin therapy on fish oil  Atypical chest pain Joel Kim was referred to me by Joel Kim for atypical chest pain.  His risk factors include treated hypertension, untreated hyperlipidemia and family history.  He said atypical constant chest pain for  last several days.  He has a history of chronic pain syndrome related to fibromyalgia and pain in his legs and back.  He does have a history of treated hypertension, untreated hyperlipidemia and a family history of heart disease.  EKG shows no acute changes.  I am going to get a 2D echo and a pharmacologic Myoview stress test to further evaluate.      Joel Harp MD FACP,FACC,FAHA, [facility] 08/15/2018 2:40 PM

## 2018-08-15 NOTE — Patient Instructions (Signed)
Medication Instructions:  Your physician recommends that you continue on your current medications as directed. Please refer to the Current Medication list given to you today.  If you need a refill on your cardiac medications before your next appointment, please call your pharmacy.   Lab work: none If you have labs (blood work) drawn today and your tests are completely normal, you will receive your results only by: Marland Kitchen MyChart Message (if you have MyChart) OR . A paper copy in the mail If you have any lab test that is abnormal or we need to change your treatment, we will call you to review the results.  Testing/Procedures: Your physician has requested that you have a lexiscan myoview. For further information please visit [email or web address]. Please follow instruction sheet, as given.  Your physician has requested that you have an echocardiogram. Echocardiography is a painless test that uses sound waves to create images of your heart. It provides your doctor with information about the size and shape of your heart and how well your heart's chambers and valves are working. This procedure takes approximately one hour. There are no restrictions for this procedure.  Follow-Up: At [facility], you and your health needs are our priority.  As part of our continuing mission to provide you with exceptional heart care, we have created designated Provider Care Teams.  These Care Teams include your primary Cardiologist (physician) and Advanced Practice Providers (APPs -  Physician Assistants and Nurse Practitioners) who all work together to provide you with the care you need, when you need it.  Follow up with Dr. Gwenlyn Found as needed.  Any Other Special Instructions Will Be Listed Below (If Applicable).

## 2018-08-15 NOTE — Assessment & Plan Note (Signed)
History of hyperlipidemia intolerant to statin therapy on fish oil

## 2018-08-15 NOTE — Assessment & Plan Note (Signed)
History of essential hypertension blood pressure measured today 132/78.  He is on amlodipine, metoprolol and hydrochlorothiazide.  Continue current meds at current dosing

## 2018-08-21 ENCOUNTER — Other Ambulatory Visit: Payer: Self-pay

## 2018-08-21 ENCOUNTER — Ambulatory Visit (HOSPITAL_COMMUNITY): Payer: Medicare HMO | Attending: Cardiovascular Disease

## 2018-08-21 DIAGNOSIS — R0789 Other chest pain: Secondary | ICD-10-CM | POA: Diagnosis not present

## 2018-08-28 ENCOUNTER — Inpatient Hospital Stay (HOSPITAL_COMMUNITY): Admission: RE | Admit: 2018-08-28 | Payer: Medicare HMO | Source: Ambulatory Visit

## 2018-08-31 ENCOUNTER — Telehealth (HOSPITAL_COMMUNITY): Payer: Self-pay

## 2018-08-31 NOTE — Telephone Encounter (Signed)
Encounter complete. 

## 2018-09-04 ENCOUNTER — Telehealth (HOSPITAL_COMMUNITY): Payer: Self-pay

## 2018-09-04 NOTE — Telephone Encounter (Signed)
Encounter complete. 

## 2018-09-05 ENCOUNTER — Ambulatory Visit (HOSPITAL_COMMUNITY)
Admission: RE | Admit: 2018-09-05 | Discharge: 2018-09-05 | Disposition: A | Discharge status: Home / Self Care | Payer: Medicare HMO | Source: Ambulatory Visit | Attending: Cardiovascular Disease | Admitting: Cardiovascular Disease

## 2018-09-05 DIAGNOSIS — R0789 Other chest pain: Secondary | ICD-10-CM

## 2018-09-05 LAB — MYOCARDIAL PERFUSION IMAGING
CHL CUP NUCLEAR SDS: 1
CHL CUP NUCLEAR SRS: 0
CHL CUP NUCLEAR SSS: 1
CHL CUP RESTING HR STRESS: 60 {beats}/min
LV dias vol: 109 mL (ref 62–150)
LV sys vol: 46 mL
NUC STRESS TID: 1.29
Peak HR: 84 {beats}/min

## 2018-09-05 MED ORDER — REGADENOSON 0.4 MG/5ML IV SOLN
0.4000 mg | Freq: Once | INTRAVENOUS | Status: AC
  Administered 2018-09-05: 0.4 mg via INTRAVENOUS

## 2018-09-05 MED ORDER — TECHNETIUM TC 99M TETROFOSMIN IV KIT
9.6000 | PACK | Freq: Once | INTRAVENOUS | Status: AC | PRN
  Administered 2018-09-05: 9.6 via INTRAVENOUS
  Filled 2018-09-05: qty 10

## 2018-09-05 MED ORDER — TECHNETIUM TC 99M TETROFOSMIN IV KIT
30.1000 | PACK | Freq: Once | INTRAVENOUS | Status: AC | PRN
  Administered 2018-09-05: 30.1 via INTRAVENOUS
  Filled 2018-09-05: qty 31

## 2018-09-26 HISTORY — PX: SPINAL CORD STIMULATOR IMPLANT: SHX2422

## 2018-10-10 NOTE — Addendum Note (Signed)
Encounter addended by: Delight Hoh A on: 10/10/2018 10:45 AM  Actions taken: Imaging Exam begun

## 2019-04-03 ENCOUNTER — Other Ambulatory Visit: Payer: Self-pay | Admitting: Anesthesiology

## 2019-04-03 ENCOUNTER — Other Ambulatory Visit (HOSPITAL_COMMUNITY): Payer: Self-pay | Admitting: Anesthesiology

## 2019-04-03 DIAGNOSIS — M545 Low back pain, unspecified: Secondary | ICD-10-CM

## 2019-04-15 ENCOUNTER — Ambulatory Visit (HOSPITAL_COMMUNITY)
Admission: RE | Admit: 2019-04-15 | Discharge: 2019-04-15 | Disposition: A | Discharge status: Home / Self Care | Payer: Medicare HMO | Source: Ambulatory Visit | Attending: Anesthesiology | Admitting: Anesthesiology

## 2019-04-15 ENCOUNTER — Other Ambulatory Visit: Payer: Self-pay

## 2019-04-15 DIAGNOSIS — M545 Low back pain, unspecified: Secondary | ICD-10-CM

## 2019-06-17 ENCOUNTER — Encounter: Payer: Self-pay | Admitting: Neurology

## 2019-06-17 ENCOUNTER — Other Ambulatory Visit: Payer: Self-pay

## 2019-06-17 ENCOUNTER — Ambulatory Visit: Payer: Medicare HMO | Admitting: Neurology

## 2019-06-17 VITALS — BP 144/80 | HR 66 | Temp 98.4°F | Ht 65.0 in | Wt 181.0 lb

## 2019-06-17 DIAGNOSIS — R51 Headache: Secondary | ICD-10-CM | POA: Diagnosis not present

## 2019-06-17 DIAGNOSIS — G44201 Tension-type headache, unspecified, intractable: Secondary | ICD-10-CM

## 2019-06-17 DIAGNOSIS — G44209 Tension-type headache, unspecified, not intractable: Secondary | ICD-10-CM

## 2019-06-17 DIAGNOSIS — G4440 Drug-induced headache, not elsewhere classified, not intractable: Secondary | ICD-10-CM | POA: Diagnosis not present

## 2019-06-17 DIAGNOSIS — R519 Headache, unspecified: Secondary | ICD-10-CM

## 2019-06-17 MED ORDER — SUMATRIPTAN SUCCINATE 50 MG PO TABS: 50.0000 mg | ORAL_TABLET | ORAL | 0 refills | Status: DC | PRN

## 2019-06-17 MED ORDER — TOPIRAMATE 50 MG PO TABS: 50.0000 mg | ORAL_TABLET | Freq: Two times a day (BID) | ORAL | 2 refills | Status: DC

## 2019-06-17 NOTE — Progress Notes (Signed)
[facility] [address] [place]. [place] [address] [phone]       OFFICE CONSULT NOTE  [name] Date of Birth:  [birth year] Medical Record Number:  [id number]   Referring MD: Anastasia Pall  Reason for Referral: Headaches  HPI: Joel Kim is a 73 year old Caucasian male seen today for initial office consultation visit for headaches.  He is accompanied by his wife.  History is obtained from them, review of electronic medical records and have personally reviewed imaging films in PACS.  Patient states he has had chronic lifelong history of what sounds like migraines which were initially intermittent.  Over the last 3 years or so is complaining of daily headaches which he describes as being bifrontal and periorbital them mild to moderate ,4/10 in severity and present throughout the day.  These headaches are not disabling and not accompanied by light or sound sensitivity or nausea vomiting.  He however does get migraine headaches about once every 2- 3 months for which he takes Imitrex at the onset with good relief.  He has been taking nortriptyline 50 mg at night for headache prophylaxis but it is not working as well.  He complains of chronic back pain and leg pain following his back surgeries and is currently taking OxyCodone three daily and also use the diclofenac ointment.  He was previously on [place] which was longer acting but his insurance company no longer covers it he denies any loss of vision, blurred vision, double vision, vertigo, gait difficulties or extremity weakness.  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  He is currently being seen in the pain clinic by Dr. Luan Pulling and plans on getting some epidural injections.  He has not had any recent brain imaging study done though he did have a tree limb fall on his head about 3 years ago when he had a noncontrast CT scan which was  unremarkable.  He did have brief loss of consciousness at that time.  Denies any history of seizures and strokes except a possible TIA in 2012 when he had transient confusion and speech difficulties.  He was seen by Dr. Jannifer Franklin at that time.  ROS:   14 system review of systems is positive for headache, neck pain, migraines, back pain, leg pain, gait and balance difficulties, insomnia and all other systems negative  PMH:  Past Medical History:  Diagnosis Date   Asthma    Cancer ([place])    Diabetes mellitus without complication ([place])    Hypertension    Hypothyroidism    Insomnia    Intractable migraine without aura    Pancreatitis    Spondylosis of lumbar spine    Stroke ([place])    Thyroid disease    TIA (transient ischemic attack)     Social History:  Social History   Socioeconomic History   Marital status: Married    Spouse name: Not on file   Number of children: Not on file   Years of education: Not on file   Highest education level: Not on file  Occupational History   Not on file  Social Needs   Financial resource strain: Not on file   Food insecurity    Worry: Not on file    Inability: Not on file   Transportation needs    Medical: Not on file    Non-medical: Not on file  Tobacco Use   Smoking status: Never Smoker  Smokeless tobacco: Never Used  Substance and Sexual Activity   Alcohol use: No   Drug use: No   Sexual activity: Not on file  Lifestyle   Physical activity    Days per week: Not on file    Minutes per session: Not on file   Stress: Not on file  Relationships   Social connections    Talks on phone: Not on file    Gets together: Not on file    Attends religious service: Not on file    Active member of club or organization: Not on file    Attends meetings of clubs or organizations: Not on file    Relationship status: Not on file   Intimate partner violence    Fear of current or ex partner: Not on file    Emotionally  abused: Not on file    Physically abused: Not on file    Forced sexual activity: Not on file  Other Topics Concern   Not on file  Social History Narrative   Not on file    Medications:   Current Outpatient Medications on File Prior to Visit  Medication Sig Dispense Refill   albuterol (PROVENTIL HFA;VENTOLIN HFA) 108 (90 Base) MCG/ACT inhaler Inhale 2 puffs into the lungs every 6 (six) hours as needed. 1 Inhaler 0   amLODipine (NORVASC) 5 MG tablet Take 5 mg by mouth daily.     cetirizine (ZYRTEC) 10 MG tablet Take 10 mg by mouth at bedtime.      Cholecalciferol (VITAMIN D3) 1000 UNITS CAPS Take 1,000 Units by mouth daily.      gabapentin (NEURONTIN) 300 MG capsule Take 300-600 mg by mouth at bedtime.      hydrochlorothiazide (HYDRODIURIL) 25 MG tablet Take 25 mg by mouth daily.     metoprolol succinate (TOPROL-XL) 50 MG 24 hr tablet Take 50 mg by mouth at bedtime. Take with or immediately following a meal.     nortriptyline (PAMELOR) 25 MG capsule Take 25 mg by mouth at bedtime.     Omega-3 Fatty Acids (FISH OIL ULTRA) 1000 MG CAPS Take 1,000 mg by mouth at bedtime.      pantoprazole (PROTONIX) 40 MG tablet Take 1 tablet (40 mg total) by mouth daily. 30 tablet 3   No current facility-administered medications on file prior to visit.     Allergies:   Allergies  Allergen Reactions   Tapentadol Rash and Other (See Comments)    Breaks patient out  Breaks patient out   Breaks patient out  Breaks patient out  Breaks patient out  Breaks patient out    Paroxetine Hcl Other (See Comments)    Irritability  Irritability   Irritability  Irritability  Irritability    Statins Other (See Comments)    Muscle aches   Adhesive [Tape]     Breaks patient out    Ciprofloxacin Hives   Flagyl [Metronidazole]    Reclast [Zoledronic Acid]     Physical Exam General: well developed, well nourished elderly Caucasian male, seated, in no evident distress Head: head  normocephalic and atraumatic.   Neck: supple with no carotid or supraclavicular bruits Cardiovascular: regular rate and rhythm, no murmurs Musculoskeletal: no deformity.  Mild tenderness in posterior cervical muscles with slight limitation of right lateral flexion and rotation Skin:  no rash/petichiae Vascular:  Normal pulses all extremities  Neurologic Exam Mental Status: Awake and fully alert. Oriented to place and time. Recent and remote memory intact. Attention span, concentration and fund  of knowledge appropriate. Mood and affect appropriate.  Cranial Nerves: Fundoscopic exam reveals sharp disc margins. Pupils equal, briskly reactive to light. Extraocular movements full without nystagmus. Visual fields full to confrontation. Hearing intact. Facial sensation intact. Face, tongue, palate moves normally and symmetrically.  Motor: Normal bulk and tone. Normal strength in all tested extremity muscles. Sensory.: intact to touch , pinprick , position and vibratory sensation.  Coordination: Rapid alternating movements normal in all extremities. Finger-to-nose and heel-to-shin performed accurately bilaterally. Gait and Station: Arises from chair without difficulty. Stance is normal. Gait demonstrates normal stride length and balance . Able to heel, toe and tandem walk without difficulty.  Reflexes: 1+ and symmetric. Toes downgoing.       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  Remote history of TIA in 2012 with vascular risk factors of hypertension, hyperlipidemia, sleep apnea     PLAN: I had a long discussion with the patient regarding his chronic daily headaches which seem like migraine headaches which are transformed to tension headaches with features of analgesic rebound.  I recommend he cut back his oxycodone and pain  medications and trial of Topamax 50 mg daily for a week to be increased to twice daily to help with his chronic tension headaches.  May consider cutting back and stopping the nortriptyline in the future.  I also encouraged him to do regular neck stretching exercises as well as increase participation in activities for stress laxation like meditation yoga swimming.  Check MRI scan of the brain with and without contrast.  He was advised to take Imitrex for symptomatic relief of his more severe migraine headaches and given refill for the same.  Greater than 50% time during this 45-minute consultation visit was spent on counseling and coordination of care about his migraine headaches, analgesic rebound headaches and answering questions will return for follow-up in the future in 3 months or call earlier if necessary.  Antony Contras, MD  [facility] [address] [place], [place] [address]  Phone [phone] Fax [phone] Note: This document was prepared with digital dictation and possible smart phrase technology. Any transcriptional errors that result from this process are unintentional.

## 2019-06-17 NOTE — Patient Instructions (Addendum)
I had a long discussion with the patient regarding his chronic daily headaches which seem like migraine headaches which are transformed to tension headaches with features of analgesic rebound.  I recommend he cut back his oxycodone and pain medications and trial of Topamax 50 mg daily for a week to be increased to twice daily to help with his chronic tension headaches.  May consider cutting back and stopping the nortriptyline in the future.  I also encouraged him to do regular neck stretching exercises as well as increase participation in activities for stress laxation like meditation yoga swimming.  Check MRI scan of the brain with and without contrast.  He was advised to take Imitrex for symptomatic relief of his more severe migraine headaches and given refill for the same.  Will return for follow-up in the future in 3 months or call earlier if necessary.   Neck Exercises Ask your health care provider which exercises are safe for you. Do exercises exactly as told by your health care provider and adjust them as directed. It is normal to feel mild stretching, pulling, tightness, or discomfort as you do these exercises. Stop right away if you feel sudden pain or your pain gets worse. Do not begin these exercises until told by your health care provider. Neck exercises can be important for many reasons. They can improve strength and maintain flexibility in your neck, which will help your upper back and prevent neck pain. Stretching exercises Rotation neck stretching  1. Sit in a chair or stand up. 2. Place your feet flat on the floor, shoulder width apart. 3. Slowly turn your head (rotate) to the right until a slight stretch is felt. Turn it all the way to the right so you can look over your right shoulder. Do not tilt or tip your head. 4. Hold this position for 10-30 seconds. 5. Slowly turn your head (rotate) to the left until a slight stretch is felt. Turn it all the way to the left so you can look over  your left shoulder. Do not tilt or tip your head. 6. Hold this position for 10-30 seconds. Repeat __________ times. Complete this exercise __________ times a day. Neck retraction 1. Sit in a sturdy chair or stand up. 2. Look straight ahead. Do not bend your neck. 3. Use your fingers to push your chin backward (retraction). Do not bend your neck for this movement. Continue to face straight ahead. If you are doing the exercise properly, you will feel a slight sensation in your throat and a stretch at the back of your neck. 4. Hold the stretch for 1-2 seconds. Repeat __________ times. Complete this exercise __________ times a day. Strengthening exercises Neck press 1. Lie on your back on a firm bed or on the floor with a pillow under your head. 2. Use your neck muscles to push your head down on the pillow and straighten your spine. 3. Hold the position as well as you can. Keep your head facing up (in a neutral position) and your chin tucked. 4. Slowly count to 5 while holding this position. Repeat __________ times. Complete this exercise __________ times a day. Isometrics These are exercises in which you strengthen the muscles in your neck while keeping your neck still (isometrics). 1. Sit in a supportive chair and place your hand on your forehead. 2. Keep your head and face facing straight ahead. Do not flex or extend your neck while doing isometrics. 3. Push forward with your head and neck while  pushing back with your hand. Hold for 10 seconds. 4. Do the sequence again, this time putting your hand against the back of your head. Use your head and neck to push backward against the hand pressure. 5. Finally, do the same exercise on either side of your head, pushing sideways against the pressure of your hand. Repeat __________ times. Complete this exercise __________ times a day. Prone head lifts 1. Lie face-down (prone position), resting on your elbows so that your chest and upper back are  raised. 2. Start with your head facing downward, near your chest. Position your chin either on or near your chest. 3. Slowly lift your head upward. Lift until you are looking straight ahead. Then continue lifting your head as far back as you can comfortably stretch. 4. Hold your head up for 5 seconds. Then slowly lower it to your starting position. Repeat __________ times. Complete this exercise __________ times a day. Supine head lifts 1. Lie on your back (supine position), bending your knees to point to the ceiling and keeping your feet flat on the floor. 2. Lift your head slowly off the floor, raising your chin toward your chest. 3. Hold for 5 seconds. Repeat __________ times. Complete this exercise __________ times a day. Scapular retraction 1. Stand with your arms at your sides. Look straight ahead. 2. Slowly pull both shoulders (scapulae) backward and downward (retraction) until you feel a stretch between your shoulder blades in your upper back. 3. Hold for 10-30 seconds. 4. Relax and repeat. Repeat __________ times. Complete this exercise __________ times a day. Contact a health care provider if:  Your neck pain or discomfort gets much worse when you do an exercise.  Your neck pain or discomfort does not improve within 2 hours after you exercise. If you have any of these problems, stop exercising right away. Do not do the exercises again unless your health care provider says that you can. Get help right away if:  You develop sudden, severe neck pain. If this happens, stop exercising right away. Do not do the exercises again unless your health care provider says that you can. This information is not intended to replace advice given to you by your health care provider. Make sure you discuss any questions you have with your health care provider. Document Released: 08/24/2015 Document Revised: 07/11/2018 Document Reviewed: 07/11/2018 Elsevier Patient Education  2020 [employer].

## 2019-06-18 ENCOUNTER — Telehealth: Payer: Self-pay | Admitting: Neurology

## 2019-06-18 NOTE — Telephone Encounter (Signed)
Joel Kim: [id number] (exp. 06/18/19 to 07/18/19) order sent to GI. They will reach out to the patient to schedule.

## 2019-07-13 ENCOUNTER — Ambulatory Visit
Admission: RE | Admit: 2019-07-13 | Discharge: 2019-07-13 | Disposition: A | Discharge status: Home / Self Care | Payer: Medicare HMO | Source: Ambulatory Visit | Attending: Neurology | Admitting: Neurology

## 2019-07-13 ENCOUNTER — Other Ambulatory Visit: Payer: Self-pay

## 2019-07-13 DIAGNOSIS — G44201 Tension-type headache, unspecified, intractable: Secondary | ICD-10-CM | POA: Diagnosis not present

## 2019-07-13 MED ORDER — GADOBENATE DIMEGLUMINE 529 MG/ML IV SOLN
17.0000 mL | Freq: Once | INTRAVENOUS | Status: AC | PRN
  Administered 2019-07-13: 17 mL via INTRAVENOUS

## 2019-07-18 ENCOUNTER — Telehealth: Payer: Self-pay

## 2019-07-18 NOTE — Telephone Encounter (Signed)
-----   Message from Garvin Fila, MD sent at 07/18/2019  3:29 PM EDT ----- Joel Kim inform the patient that MRI scan of the brain shows mild age-appropriate changes of shrinkage of the brain and hardening of the arteries.  No worrisome findings

## 2019-07-18 NOTE — Telephone Encounter (Signed)
I called pts wife that the MRI scan of brain shows mild age appropriate changes of shrinkage of brain and hardening of arteries. No worrisome findings. The wife verbalized understanding.  ------

## 2019-09-09 ENCOUNTER — Ambulatory Visit: Payer: Self-pay | Admitting: Neurology

## 2019-09-27 DIAGNOSIS — I639 Cerebral infarction, unspecified: Secondary | ICD-10-CM

## 2019-09-27 HISTORY — DX: Cerebral infarction, unspecified: I63.9

## 2019-09-27 HISTORY — PX: ROTATOR CUFF REPAIR: SHX139

## 2019-10-21 ENCOUNTER — Ambulatory Visit: Payer: Self-pay | Admitting: Neurology

## 2019-11-23 ENCOUNTER — Ambulatory Visit: Payer: Medicare HMO | Attending: Internal Medicine

## 2019-11-23 DIAGNOSIS — Z23 Encounter for immunization: Secondary | ICD-10-CM | POA: Insufficient documentation

## 2019-11-23 NOTE — Progress Notes (Signed)
   Covid-19 Vaccination Clinic  Name:  Joel Kim    MRN: [id number] DOB: 1946/05/13  11/23/2019  [name] was observed post Covid-19 immunization for 30 minutes based on pre-vaccination screening without incidence. He was provided with Vaccine Information Sheet and instruction to access the V-Safe system.   [name] was instructed to call 911 with any severe reactions post vaccine: Marland Kitchen Difficulty breathing  . Swelling of your face and throat  . A fast heartbeat  . A bad rash all over your body  . Dizziness and weakness    Immunizations Administered    Name Date Dose VIS Date Route   Pfizer COVID-19 Vaccine 11/23/2019  4:49 PM 0.3 mL 09/06/2019 Intramuscular   Manufacturer: [place]   Lot: [id number]   [place]: [id number]

## 2020-02-14 ENCOUNTER — Ambulatory Visit: Payer: Medicare HMO | Admitting: Physician Assistant

## 2020-03-05 ENCOUNTER — Ambulatory Visit: Payer: Medicare HMO | Attending: Orthopedic Surgery | Admitting: Physical Therapy

## 2020-03-05 ENCOUNTER — Other Ambulatory Visit: Payer: Self-pay

## 2020-03-05 DIAGNOSIS — R600 Localized edema: Secondary | ICD-10-CM | POA: Insufficient documentation

## 2020-03-05 DIAGNOSIS — M25612 Stiffness of left shoulder, not elsewhere classified: Secondary | ICD-10-CM | POA: Insufficient documentation

## 2020-03-05 DIAGNOSIS — M25512 Pain in left shoulder: Secondary | ICD-10-CM | POA: Insufficient documentation

## 2020-03-05 NOTE — Therapy (Signed)
[facility] [place], [place], [address] Phone: [phone]   Fax:  [phone]  Physical Therapy Evaluation  Patient Details  Name: Joel Kim MRN: [id number] Date of Birth: April 11, 1946 Referring Provider (PT): Harvest Dark MD   Encounter Date: 03/05/2020   PT End of Session - 03/05/20 1620    Visit Number 1    Number of Visits 18    Date for PT Re-Evaluation 04/30/20    Authorization Type FOTO    PT Start Time 0318    PT Stop Time 0408    PT Time Calculation (min) 50 min           Past Medical History:  Diagnosis Date  . Asthma   . Atypical nevus 01/26/2006   right side of body (slight)  . Basal cell carcinoma 11/17/2015   left ear rim tx cx3 75fu  . Cancer ([place])   . Diabetes mellitus without complication ([place])   . Hypertension   . Hypothyroidism   . Insomnia   . Intractable migraine without aura   . Melanoma ([place]) 10/10/2014   right ant. shoulder (exc)  . Pancreatitis   . Spondylosis of lumbar spine   . Stroke ([place])   . Thyroid disease   . TIA (transient ischemic attack)     Past Surgical History:  Procedure Laterality Date  . BACK SURGERY    . CHOLECYSTECTOMY    . FOOT SURGERY    . JOINT REPLACEMENT    . rightt knee replacement      There were no vitals filed for this visit.    Subjective Assessment - 03/05/20 1624    Subjective COVID-19 screen performed prior to patient entering clinic.  The patient presents to the clinic today s/p right shoulder RTC repair surgery performed on 02/27/20.  He presents to the clinic today with his sling on.  His resting pain-level is a 4/10 today.  Pain medication and ice decrease pain.  Moving his shoulderincreases his pain.    Patient is accompained by: Family member   Wife.   Pertinent History DM, TIA, back and foot surgery, thyroid problem.    Patient Stated Goals Use left UE without pain.    Currently in Pain? Yes    Pain Score 4     Pain Location  Shoulder    Pain Orientation Left    Pain Descriptors / Indicators Aching    Pain Type Acute pain    Pain Onset More than a month ago    Pain Frequency Constant    Aggravating Factors  See above.    Pain Relieving Factors See above.              [facility] PT Assessment - 03/05/20 0001      Assessment   Medical Diagnosis Traumatic complete tear of left RTC    Referring Provider (PT) Harvest Dark MD    Onset Date/Surgical Date --   02/26/09 (surgery date).     Precautions   Required Braces or Orthoses Sling   Right shoulder sling with abduction pillow.     Restrictions   Weight Bearing Restrictions --   No RT UE WBing.     Balance Screen   Has the patient fallen in the past 6 months No    Has the patient had a decrease in activity level because of a fear of falling?  No    Is the patient reluctant to leave their home because of a fear of  falling?  No      Home Environment   Living Environment Private residence      Prior Function   Level of Independence Independent      Observation/Other Assessments   Observations Left shoulder scope sites with steri-strips.  Wife states they already fell off.    Skin Integrity Some rashing in area of left medial distal upper arm due to sling.  Wife has padded sling now.    Focus on Therapeutic Outcomes (FOTO)  88% limitation.      Observation/Other Assessments-Edema    Edema --   Minimal+ left shoulder edema present.     ROM / Strength   AROM / PROM / Strength PROM      PROM   Overall PROM Comments In supine:  Gentle passive left shoulder flexion to 75 degrees and ER to 0 degrees.                      Objective measurements completed on examination: See above findings.       [facility] Adult PT Treatment/Exercise - 03/05/20 0001      Modalities   Modalities Vasopneumatic      Vasopneumatic   Number Minutes Vasopneumatic  20 minutes    Vasopnuematic Location  --   Left shoulder.   Vasopneumatic Pressure Low    Pillow between thorax and elbow.                      PT Long Term Goals - 03/05/20 1719      PT LONG TERM GOAL #1   Title Independent with a HEP.    Time 8    Period Weeks    Status New      PT LONG TERM GOAL #2   Title Perform ADL's with pain not > 3/10.    Time 8    Period Weeks    Status New      PT LONG TERM GOAL #3   Title Active left shoulder flexion to 145 degrees so the patient can easily reach overhead.    Time 8    Period Weeks    Status New      PT LONG TERM GOAL #4   Title Active ER to 70 degrees+ to allow for easily donning/doffing of apparel.    Time 8    Period Weeks    Status New      PT LONG TERM GOAL #5   Title Increase ROM so patient is able to reach behind back to L3.    Time 8    Period Weeks    Status New      PT LONG TERM GOAL #6   Title Increase shoulder strength to a solid 4 to 4+/5 to increase stability for performance of functional activities.    Time 8    Period Weeks    Status New                  Plan - 03/05/20 1703    Clinical Impression Statement The patient patient presents to OPPT s/p left shoulder RTC repair performed on 02/27/20.  He c/o diffuse left shoulder pain currently and has a significant loss of ROM (performed passively) as expected.  He has been compliant to the use with his sling with abduction pillow.  His FOTO score demonstrated a limitation of 88%.  Patient will benefit from skilled physical therapy intervention to address deficits and pain.  Personal Factors and Comorbidities Finances    Examination-Activity Limitations Other    Examination-Participation Restrictions Other    Stability/Clinical Decision Making Stable/Uncomplicated    Clinical Decision Making Low    Rehab Potential Excellent    PT Frequency --   2-3 times a week x 8 weeks.   PT Treatment/Interventions ADLs/Self Care Home Management;Cryotherapy;Electrical Stimulation;Therapeutic exercise;Manual techniques;Patient/family  education;Vasopneumatic Device;Ultrasound;Passive range of motion    PT Next Visit Plan Begin with gentle left shoulder PROM.  Please review HEP.  Vasopneumatic on low with pillow between left elbow and thorax.    Consulted and Agree with Plan of Care Patient           Patient will benefit from skilled therapeutic intervention in order to improve the following deficits and impairments:  Pain, Decreased activity tolerance, Increased edema, Decreased range of motion  Visit Diagnosis: Acute pain of left shoulder - Plan: PT plan of care cert/re-cert  Stiffness of left shoulder, not elsewhere classified - Plan: PT plan of care cert/re-cert  Localized edema - Plan: PT plan of care cert/re-cert     Problem List Patient Active Problem List   Diagnosis Date Noted  . Atypical chest pain 08/15/2018  . Hyperlipidemia 08/15/2018  . Family history of heart disease 08/15/2018  . Hypertension 05/31/2018  . Hypothyroidism 05/31/2018  . Acute pancreatitis 05/31/2018  . Mild renal insufficiency 05/31/2018    Nyshawn Gowdy, [name] 03/05/2020, 5:22 PM  [facility] [address] [place], [place], [address] Phone: [phone]   Fax:  [phone]  Name: CHOSEN GESKE MRN: [id number] Date of Birth: Nov 26, 1945

## 2020-03-10 ENCOUNTER — Ambulatory Visit: Payer: Medicare HMO | Admitting: Physician Assistant

## 2020-03-11 ENCOUNTER — Encounter: Payer: Self-pay | Admitting: Physical Therapy

## 2020-03-11 ENCOUNTER — Other Ambulatory Visit: Payer: Self-pay

## 2020-03-11 ENCOUNTER — Ambulatory Visit: Payer: Medicare HMO | Admitting: Physical Therapy

## 2020-03-11 DIAGNOSIS — M25612 Stiffness of left shoulder, not elsewhere classified: Secondary | ICD-10-CM

## 2020-03-11 DIAGNOSIS — M25512 Pain in left shoulder: Secondary | ICD-10-CM | POA: Diagnosis not present

## 2020-03-11 DIAGNOSIS — R600 Localized edema: Secondary | ICD-10-CM

## 2020-03-11 NOTE — Therapy (Signed)
[facility] [place], [place], [address] Phone: [phone]   Fax:  [phone]  Physical Therapy Treatment  Patient Details  Name: Joel Kim MRN: [id number] Date of Birth: 1946/08/16 Referring Provider (PT): Harvest Dark MD   Encounter Date: 03/11/2020   PT End of Session - 03/11/20 1514    Visit Number 2    Number of Visits 18    Date for PT Re-Evaluation 04/30/20    Authorization Type FOTO    PT Start Time 1514    PT Stop Time 1557    PT Time Calculation (min) 43 min    Equipment Utilized During Treatment Other (comment)   sling with adductor pillow   Activity Tolerance Patient tolerated treatment well    Behavior During Therapy [facility] for tasks assessed/performed           Past Medical History:  Diagnosis Date  . Asthma   . Atypical nevus 01/26/2006   right side of body (slight)  . Basal cell carcinoma 11/17/2015   left ear rim tx cx3 46fu  . Cancer ([place])   . Diabetes mellitus without complication ([place])   . Hypertension   . Hypothyroidism   . Insomnia   . Intractable migraine without aura   . Melanoma ([place]) 10/10/2014   right ant. shoulder (exc)  . Pancreatitis   . Spondylosis of lumbar spine   . Stroke ([place])   . Thyroid disease   . TIA (transient ischemic attack)     Past Surgical History:  Procedure Laterality Date  . BACK SURGERY    . CHOLECYSTECTOMY    . FOOT SURGERY    . JOINT REPLACEMENT    . rightt knee replacement      There were no vitals filed for this visit.   Subjective Assessment - 03/11/20 1512    Subjective COVID 19 screening performed on patient upon arrival. Reports that he got a good report at MD earlier but was told today he will have sling for 8 weeks post op due to increased damage.    Patient is accompained by: Family member   Wife   Pertinent History DM, TIA, back and foot surgery, thyroid problem.    Patient Stated Goals Use left UE without pain.    Currently in  Pain? Yes    Pain Score 3     Pain Location Shoulder    Pain Orientation Left    Pain Descriptors / Indicators Sore    Pain Type Surgical pain    Pain Onset More than a month ago    Pain Frequency Intermittent              [facility] PT Assessment - 03/11/20 0001      Assessment   Medical Diagnosis Traumatic complete tear of left RTC    Referring Provider (PT) Harvest Dark MD    Onset Date/Surgical Date 02/27/20    Next MD Visit 04/08/2020      Precautions   Required Braces or Orthoses Sling                         [facility] Adult PT Treatment/Exercise - 03/11/20 0001      Modalities   Modalities Vasopneumatic      Vasopneumatic   Number Minutes Vasopneumatic  15 minutes    Vasopnuematic Location  Shoulder    Vasopneumatic Pressure Low    Vasopneumatic Temperature  34      Manual Therapy  Manual Therapy Passive ROM    Passive ROM PROM of R shoulder into flex, ER, IR with gentle holds at end range                       PT Long Term Goals - 03/05/20 1719      PT LONG TERM GOAL #1   Title Independent with a HEP.    Time 8    Period Weeks    Status New      PT LONG TERM GOAL #2   Title Perform ADL's with pain not > 3/10.    Time 8    Period Weeks    Status New      PT LONG TERM GOAL #3   Title Active left shoulder flexion to 145 degrees so the patient can easily reach overhead.    Time 8    Period Weeks    Status New      PT LONG TERM GOAL #4   Title Active ER to 70 degrees+ to allow for easily donning/doffing of apparel.    Time 8    Period Weeks    Status New      PT LONG TERM GOAL #5   Title Increase ROM so patient is able to reach behind back to L3.    Time 8    Period Weeks    Status New      PT LONG TERM GOAL #6   Title Increase shoulder strength to a solid 4 to 4+/5 to increase stability for performance of functional activities.    Time 8    Period Weeks    Status New                 Plan - 03/11/20  1547    Clinical Impression Statement Patient presented in clinic with limited L shoulder pain. Patient instructed by MD today and is to keep sling on for 8 weeks total but can remove adductor pillow at 6 weeks post op. Firm end feels and smooth arc of motion noted during PROM of L shoulder in all directions. Intermittant oscillations provided during PROM to reduce pain and muscle guarding. Normal modalities response noted following removal of the modalities.    Personal Factors and Comorbidities Finances    Examination-Activity Limitations Other    Examination-Participation Restrictions Other    Stability/Clinical Decision Making Stable/Uncomplicated    Rehab Potential Excellent    PT Treatment/Interventions ADLs/Self Care Home Management;Cryotherapy;Electrical Stimulation;Therapeutic exercise;Manual techniques;Patient/family education;Vasopneumatic Device;Ultrasound;Passive range of motion    PT Next Visit Plan Begin with gentle left shoulder PROM.  Please review HEP.  Vasopneumatic on low with pillow between left elbow and thorax.    Consulted and Agree with Plan of Care Patient           Patient will benefit from skilled therapeutic intervention in order to improve the following deficits and impairments:  Pain, Decreased activity tolerance, Increased edema, Decreased range of motion  Visit Diagnosis: Acute pain of left shoulder  Stiffness of left shoulder, not elsewhere classified  Localized edema     Problem List Patient Active Problem List   Diagnosis Date Noted  . Atypical chest pain 08/15/2018  . Hyperlipidemia 08/15/2018  . Family history of heart disease 08/15/2018  . Hypertension 05/31/2018  . Hypothyroidism 05/31/2018  . Acute pancreatitis 05/31/2018  . Mild renal insufficiency 05/31/2018    Standley Brooking, PTA 03/11/2020, 4:02 PM  [facility] [place]  [place], [place], [address] Phone: [phone]   Fax:   [phone]  Name: Joel Kim MRN: [id number] Date of Birth: 12/14/1945

## 2020-03-12 ENCOUNTER — Encounter: Payer: Self-pay | Admitting: Physical Therapy

## 2020-03-12 ENCOUNTER — Ambulatory Visit: Payer: Medicare HMO | Admitting: Physical Therapy

## 2020-03-12 DIAGNOSIS — M25512 Pain in left shoulder: Secondary | ICD-10-CM

## 2020-03-12 DIAGNOSIS — M25612 Stiffness of left shoulder, not elsewhere classified: Secondary | ICD-10-CM

## 2020-03-12 DIAGNOSIS — R600 Localized edema: Secondary | ICD-10-CM

## 2020-03-12 NOTE — Therapy (Signed)
[place], [place], [address] Phone: [phone]   Fax:  [phone]  Physical Therapy Treatment  Patient Details  Name: Joel Kim MRN: [id number] Date of Birth: 30-Jan-1946 Referring Provider (PT): Harvest Dark MD   Encounter Date: 03/12/2020   PT End of Session - 03/12/20 1725    Visit Number 3    Number of Visits 18    Date for PT Re-Evaluation 04/30/20    Authorization Type FOTO    PT Start Time 1652    PT Stop Time 1733    PT Time Calculation (min) 41 min    Equipment Utilized During Treatment Other (comment)   sling with adductor pillow   Activity Tolerance Patient tolerated treatment well    Behavior During Therapy [facility] for tasks assessed/performed           Past Medical History:  Diagnosis Date  . Asthma   . Atypical nevus 01/26/2006   right side of body (slight)  . Basal cell carcinoma 11/17/2015   left ear rim tx cx3 1fu  . Cancer ([place])   . Diabetes mellitus without complication ([place])   . Hypertension   . Hypothyroidism   . Insomnia   . Intractable migraine without aura   . Melanoma ([place]) 10/10/2014   right ant. shoulder (exc)  . Pancreatitis   . Spondylosis of lumbar spine   . Stroke ([place])   . Thyroid disease   . TIA (transient ischemic attack)     Past Surgical History:  Procedure Laterality Date  . BACK SURGERY    . CHOLECYSTECTOMY    . FOOT SURGERY    . JOINT REPLACEMENT    . rightt knee replacement      There were no vitals filed for this visit.   Subjective Assessment - 03/12/20 1653    Subjective COVID 19 screening performed on patient upon arrival. Reports he is doing good.    Patient is accompained by: Family member   wife   Pertinent History DM, TIA, back and foot surgery, thyroid problem.    Patient Stated Goals Use left UE without pain.    Currently in Pain? Yes    Pain Score --   No pain assessment provided   Pain Location Shoulder    Pain Orientation  Left    Pain Descriptors / Indicators Discomfort    Pain Type Surgical pain    Pain Onset More than a month ago    Pain Frequency Intermittent              [facility] PT Assessment - 03/12/20 0001      Assessment   Medical Diagnosis Traumatic complete tear of left RTC    Referring Provider (PT) Harvest Dark MD    Onset Date/Surgical Date 02/27/20    Next MD Visit 04/08/2020      Precautions   Required Braces or Orthoses Sling                         [facility] Adult PT Treatment/Exercise - 03/12/20 0001      Modalities   Modalities Vasopneumatic      Vasopneumatic   Number Minutes Vasopneumatic  10 minutes    Vasopnuematic Location  Shoulder    Vasopneumatic Pressure Low    Vasopneumatic Temperature  34      Manual Therapy   Manual Therapy Passive ROM    Passive ROM PROM of R shoulder into flex, ER,  IR with gentle holds at end range                       PT Long Term Goals - 03/05/20 1719      PT LONG TERM GOAL #1   Title Independent with a HEP.    Time 8    Period Weeks    Status New      PT LONG TERM GOAL #2   Title Perform ADL's with pain not > 3/10.    Time 8    Period Weeks    Status New      PT LONG TERM GOAL #3   Title Active left shoulder flexion to 145 degrees so the patient can easily reach overhead.    Time 8    Period Weeks    Status New      PT LONG TERM GOAL #4   Title Active ER to 70 degrees+ to allow for easily donning/doffing of apparel.    Time 8    Period Weeks    Status New      PT LONG TERM GOAL #5   Title Increase ROM so patient is able to reach behind back to L3.    Time 8    Period Weeks    Status New      PT LONG TERM GOAL #6   Title Increase shoulder strength to a solid 4 to 4+/5 to increase stability for performance of functional activities.    Time 8    Period Weeks    Status New                 Plan - 03/12/20 1730    Clinical Impression Statement Patient presented in clinic with  only intermittant pain per patient report. Patient presented with sling donned upon arrival. Firm end feels and smooth arc of motion noted during PROM in all directions. Intermittant oscillations provided during treatment to reduce muscle guarding. Normal vasopnuematic response noted following removal of the modality.    Personal Factors and Comorbidities Finances    Examination-Activity Limitations Other    Examination-Participation Restrictions Other    Stability/Clinical Decision Making Stable/Uncomplicated    Rehab Potential Excellent    PT Treatment/Interventions ADLs/Self Care Home Management;Cryotherapy;Electrical Stimulation;Therapeutic exercise;Manual techniques;Patient/family education;Vasopneumatic Device;Ultrasound;Passive range of motion    PT Next Visit Plan Begin with gentle left shoulder PROM.  Please review HEP.  Vasopneumatic on low with pillow between left elbow and thorax.    Consulted and Agree with Plan of Care Patient           Patient will benefit from skilled therapeutic intervention in order to improve the following deficits and impairments:  Pain, Decreased activity tolerance, Increased edema, Decreased range of motion  Visit Diagnosis: Acute pain of left shoulder  Stiffness of left shoulder, not elsewhere classified  Localized edema     Problem List Patient Active Problem List   Diagnosis Date Noted  . Atypical chest pain 08/15/2018  . Hyperlipidemia 08/15/2018  . Family history of heart disease 08/15/2018  . Hypertension 05/31/2018  . Hypothyroidism 05/31/2018  . Acute pancreatitis 05/31/2018  . Mild renal insufficiency 05/31/2018    Standley Brooking, PTA 03/12/2020, 5:47 PM  [facility] [address] [place], [place], [address] Phone: [phone]   Fax:  [phone]  Name: Joel Kim MRN: [id number] Date of Birth: March 12, 1946

## 2020-03-17 ENCOUNTER — Ambulatory Visit: Payer: Medicare HMO | Admitting: Physical Therapy

## 2020-03-17 ENCOUNTER — Other Ambulatory Visit: Payer: Self-pay

## 2020-03-17 DIAGNOSIS — M25612 Stiffness of left shoulder, not elsewhere classified: Secondary | ICD-10-CM

## 2020-03-17 DIAGNOSIS — M25512 Pain in left shoulder: Secondary | ICD-10-CM | POA: Diagnosis not present

## 2020-03-17 DIAGNOSIS — R600 Localized edema: Secondary | ICD-10-CM

## 2020-03-17 NOTE — Therapy (Signed)
[facility] [place], [place], [address] Phone: [phone]   Fax:  [phone]  Physical Therapy Treatment  Patient Details  Name: Joel Kim MRN: [id number] Date of Birth: [birth year] Referring Provider (PT): Harvest Dark MD   Encounter Date: 03/17/2020   PT End of Session - 03/17/20 1748    Visit Number 4    Number of Visits 18    Date for PT Re-Evaluation 04/30/20    Authorization Type FOTO    PT Start Time 0445    PT Stop Time 0538    PT Time Calculation (min) 53 min    Activity Tolerance Patient tolerated treatment well    Behavior During Therapy [facility] for tasks assessed/performed           Past Medical History:  Diagnosis Date  . Asthma   . Atypical nevus 01/26/2006   right side of body (slight)  . Basal cell carcinoma 11/17/2015   left ear rim tx cx3 69fu  . Cancer ([place])   . Diabetes mellitus without complication ([place])   . Hypertension   . Hypothyroidism   . Insomnia   . Intractable migraine without aura   . Melanoma ([place]) 10/10/2014   right ant. shoulder (exc)  . Pancreatitis   . Spondylosis of lumbar spine   . Stroke ([place])   . Thyroid disease   . TIA (transient ischemic attack)     Past Surgical History:  Procedure Laterality Date  . BACK SURGERY    . CHOLECYSTECTOMY    . FOOT SURGERY    . JOINT REPLACEMENT    . rightt knee replacement      There were no vitals filed for this visit.   Subjective Assessment - 03/17/20 1747    Subjective COVID-19 screen performed prior to patient entering clinic.  No new complaints.    Patient is accompained by: Family member    Pertinent History DM, TIA, back and foot surgery, thyroid problem.    Patient Stated Goals Use left UE without pain.    Currently in Pain? Yes    Pain Score 1     Pain Location Shoulder    Pain Orientation Left    Pain Descriptors / Indicators Discomfort    Pain Onset More than a month ago                              [facility] Adult PT Treatment/Exercise - 03/17/20 0001      Modalities   Modalities Electrical Stimulation;Vasopneumatic      Electrical Stimulation   Electrical Stimulation Location Left shoulder.    Electrical Stimulation Action IFC (non-motoric).    Electrical Stimulation Parameters 80-150 Hz x 15 minutes.    Electrical Stimulation Goals Edema;Pain      Vasopneumatic   Number Minutes Vasopneumatic  15 minutes    Vasopnuematic Location  --   Left shoulder with pillow between left elbow and thorax.       Vasopneumatic Pressure Low      Manual Therapy   Manual Therapy Passive ROM    Passive ROM In supine:  PROM x 23 minutes to patient's left shoulder into flexion, ER, IR and abduction with low load long duration stretching technique also utilized.                       PT Long Term Goals - 03/05/20 1719  PT LONG TERM GOAL #1   Title Independent with a HEP.    Time 8    Period Weeks    Status New      PT LONG TERM GOAL #2   Title Perform ADL's with pain not > 3/10.    Time 8    Period Weeks    Status New      PT LONG TERM GOAL #3   Title Active left shoulder flexion to 145 degrees so the patient can easily reach overhead.    Time 8    Period Weeks    Status New      PT LONG TERM GOAL #4   Title Active ER to 70 degrees+ to allow for easily donning/doffing of apparel.    Time 8    Period Weeks    Status New      PT LONG TERM GOAL #5   Title Increase ROM so patient is able to reach behind back to L3.    Time 8    Period Weeks    Status New      PT LONG TERM GOAL #6   Title Increase shoulder strength to a solid 4 to 4+/5 to increase stability for performance of functional activities.    Time 8    Period Weeks    Status New                 Plan - 03/17/20 1752    Clinical Impression Statement The patient is doing very well.  Surgical scope sites closed and look to be healing very well.  His left  shoulder PROM has improved signifcantly since starting PT.    Personal Factors and Comorbidities Finances    Examination-Activity Limitations Other    Examination-Participation Restrictions Other    Stability/Clinical Decision Making Stable/Uncomplicated    Rehab Potential Excellent    PT Treatment/Interventions ADLs/Self Care Home Management;Cryotherapy;Electrical Stimulation;Therapeutic exercise;Manual techniques;Patient/family education;Vasopneumatic Device;Ultrasound;Passive range of motion    Consulted and Agree with Plan of Care Patient           Patient will benefit from skilled therapeutic intervention in order to improve the following deficits and impairments:  Pain, Decreased activity tolerance, Increased edema, Decreased range of motion  Visit Diagnosis: Acute pain of left shoulder  Stiffness of left shoulder, not elsewhere classified  Localized edema     Problem List Patient Active Problem List   Diagnosis Date Noted  . Atypical chest pain 08/15/2018  . Hyperlipidemia 08/15/2018  . Family history of heart disease 08/15/2018  . Hypertension 05/31/2018  . Hypothyroidism 05/31/2018  . Acute pancreatitis 05/31/2018  . Mild renal insufficiency 05/31/2018    Rhen Dossantos, [name] 03/17/2020, 5:55 PM  [facility] [address] [place], [place], [address] Phone: [phone]   Fax:  [phone]  Name: Joel Kim MRN: [id number] Date of Birth: 03-Feb-1946

## 2020-03-19 ENCOUNTER — Ambulatory Visit: Payer: Medicare HMO | Admitting: Physical Therapy

## 2020-03-19 ENCOUNTER — Encounter: Payer: Self-pay | Admitting: Physical Therapy

## 2020-03-19 ENCOUNTER — Other Ambulatory Visit: Payer: Self-pay

## 2020-03-19 DIAGNOSIS — R600 Localized edema: Secondary | ICD-10-CM

## 2020-03-19 DIAGNOSIS — M25512 Pain in left shoulder: Secondary | ICD-10-CM

## 2020-03-19 DIAGNOSIS — M25612 Stiffness of left shoulder, not elsewhere classified: Secondary | ICD-10-CM

## 2020-03-19 NOTE — Therapy (Signed)
[facility] [place], [place], [address] Phone: [phone]   Fax:  [phone]  Physical Therapy Treatment  Patient Details  Name: TIMOTEO CARREIRO MRN: [id number] Date of Birth: 1946-03-16 Referring Provider (PT): Harvest Dark MD   Encounter Date: 03/19/2020   PT End of Session - 03/19/20 1648    Visit Number 5    Number of Visits 18    Date for PT Re-Evaluation 04/30/20    Authorization Type FOTO    PT Start Time 1648    PT Stop Time 1726    PT Time Calculation (min) 38 min    Equipment Utilized During Treatment Other (comment)   sling   Activity Tolerance Patient tolerated treatment well    Behavior During Therapy [facility] for tasks assessed/performed           Past Medical History:  Diagnosis Date  . Asthma   . Atypical nevus 01/26/2006   right side of body (slight)  . Basal cell carcinoma 11/17/2015   left ear rim tx cx3 28fu  . Cancer ([place])   . Diabetes mellitus without complication ([place])   . Hypertension   . Hypothyroidism   . Insomnia   . Intractable migraine without aura   . Melanoma ([place]) 10/10/2014   right ant. shoulder (exc)  . Pancreatitis   . Spondylosis of lumbar spine   . Stroke ([place])   . Thyroid disease   . TIA (transient ischemic attack)     Past Surgical History:  Procedure Laterality Date  . BACK SURGERY    . CHOLECYSTECTOMY    . FOOT SURGERY    . JOINT REPLACEMENT    . rightt knee replacement      There were no vitals filed for this visit.   Subjective Assessment - 03/19/20 1647    Subjective COVID-19 screen performed prior to patient entering clinic.  Reports more pain after last treatment but wife reports they should probably be more consistent with iciing at home.    Patient is accompained by: Family member   Wife   Pertinent History DM, TIA, back and foot surgery, thyroid problem.    Patient Stated Goals Use left UE without pain.    Currently in Pain? Yes    Pain Score 2      Pain Location Shoulder    Pain Orientation Left    Pain Descriptors / Indicators Discomfort    Pain Type Surgical pain    Pain Onset More than a month ago    Pain Frequency Intermittent              [facility] PT Assessment - 03/19/20 0001      Assessment   Medical Diagnosis Traumatic complete tear of left RTC    Referring Provider (PT) Harvest Dark MD    Onset Date/Surgical Date 02/27/20    Next MD Visit 04/08/2020      Precautions   Required Braces or Orthoses Sling                         [facility] Adult PT Treatment/Exercise - 03/19/20 0001      Modalities   Modalities Vasopneumatic      Vasopneumatic   Number Minutes Vasopneumatic  10 minutes    Vasopnuematic Location  Shoulder    Vasopneumatic Pressure Low    Vasopneumatic Temperature  34      Manual Therapy   Manual Therapy Passive ROM    Passive  ROM PROM of L shoulder into flexion, ER, IR with gentle holds at end range                       PT Long Term Goals - 03/05/20 1719      PT LONG TERM GOAL #1   Title Independent with a HEP.    Time 8    Period Weeks    Status New      PT LONG TERM GOAL #2   Title Perform ADL's with pain not > 3/10.    Time 8    Period Weeks    Status New      PT LONG TERM GOAL #3   Title Active left shoulder flexion to 145 degrees so the patient can easily reach overhead.    Time 8    Period Weeks    Status New      PT LONG TERM GOAL #4   Title Active ER to 70 degrees+ to allow for easily donning/doffing of apparel.    Time 8    Period Weeks    Status New      PT LONG TERM GOAL #5   Title Increase ROM so patient is able to reach behind back to L3.    Time 8    Period Weeks    Status New      PT LONG TERM GOAL #6   Title Increase shoulder strength to a solid 4 to 4+/5 to increase stability for performance of functional activities.    Time 8    Period Weeks    Status New                 Plan - 03/19/20 1739    Clinical  Impression Statement Patient presented in clinic with minimal L shoulder pain. Patient did indicated tolerable L deltoid discomfort with eccentric flexion as well as at end range ER. Firm end feels and smooth arc of motion noted during PROM in all directions. Normal vasopneumatic response notd following removal of the modalities.    Personal Factors and Comorbidities Finances    Examination-Activity Limitations Other    Examination-Participation Restrictions Other    Stability/Clinical Decision Making Stable/Uncomplicated    Rehab Potential Excellent    PT Treatment/Interventions ADLs/Self Care Home Management;Cryotherapy;Electrical Stimulation;Therapeutic exercise;Manual techniques;Patient/family education;Vasopneumatic Device;Ultrasound;Passive range of motion    PT Next Visit Plan Begin with gentle left shoulder PROM.  Please review HEP.  Vasopneumatic on low with pillow between left elbow and thorax.    Consulted and Agree with Plan of Care Patient           Patient will benefit from skilled therapeutic intervention in order to improve the following deficits and impairments:  Pain, Decreased activity tolerance, Increased edema, Decreased range of motion  Visit Diagnosis: Acute pain of left shoulder  Stiffness of left shoulder, not elsewhere classified  Localized edema     Problem List Patient Active Problem List   Diagnosis Date Noted  . Atypical chest pain 08/15/2018  . Hyperlipidemia 08/15/2018  . Family history of heart disease 08/15/2018  . Hypertension 05/31/2018  . Hypothyroidism 05/31/2018  . Acute pancreatitis 05/31/2018  . Mild renal insufficiency 05/31/2018    Standley Brooking, PTA 03/19/2020, 5:52 PM  [facility] [address] [place], [place], [address] Phone: [phone]   Fax:  [phone]  Name: DAVIONNE DOWTY MRN: [id number] Date of Birth: [birth year]

## 2020-03-24 ENCOUNTER — Encounter: Payer: Self-pay | Admitting: Physical Therapy

## 2020-03-24 ENCOUNTER — Other Ambulatory Visit: Payer: Self-pay

## 2020-03-24 ENCOUNTER — Ambulatory Visit: Payer: Medicare HMO | Admitting: Physical Therapy

## 2020-03-24 DIAGNOSIS — M25512 Pain in left shoulder: Secondary | ICD-10-CM | POA: Diagnosis not present

## 2020-03-24 DIAGNOSIS — M25612 Stiffness of left shoulder, not elsewhere classified: Secondary | ICD-10-CM

## 2020-03-24 DIAGNOSIS — R600 Localized edema: Secondary | ICD-10-CM

## 2020-03-24 NOTE — Therapy (Signed)
[facility] [place], [place], [address] Phone: [phone]   Fax:  [phone]  Physical Therapy Treatment  Patient Details  Name: Joel Kim MRN: [id number] Date of Birth: Aug 01, 1946 Referring Provider (PT): Harvest Dark MD   Encounter Date: 03/24/2020   PT End of Session - 03/24/20 1647    Visit Number 6    Number of Visits 18    Date for PT Re-Evaluation 04/30/20    Authorization Type FOTO    PT Start Time 1647    PT Stop Time 1729    PT Time Calculation (min) 42 min    Equipment Utilized During Treatment Other (comment)   sling with adductor squeeze   Activity Tolerance Patient tolerated treatment well    Behavior During Therapy [facility] for tasks assessed/performed           Past Medical History:  Diagnosis Date  . Asthma   . Atypical nevus 01/26/2006   right side of body (slight)  . Basal cell carcinoma 11/17/2015   left ear rim tx cx3 80fu  . Cancer ([place])   . Diabetes mellitus without complication ([place])   . Hypertension   . Hypothyroidism   . Insomnia   . Intractable migraine without aura   . Melanoma ([place]) 10/10/2014   right ant. shoulder (exc)  . Pancreatitis   . Spondylosis of lumbar spine   . Stroke ([place])   . Thyroid disease   . TIA (transient ischemic attack)     Past Surgical History:  Procedure Laterality Date  . BACK SURGERY    . CHOLECYSTECTOMY    . FOOT SURGERY    . JOINT REPLACEMENT    . rightt knee replacement      There were no vitals filed for this visit.   Subjective Assessment - 03/24/20 1646    Subjective COVID 19 screening performed on patient upon arrival. Patient reports he slep without sling last night but still had a pillow. Reports he sleeps better without it.    Patient is accompained by: Family member   Wife   Pertinent History DM, TIA, back and foot surgery, thyroid problem.    Patient Stated Goals Use left UE without pain.    Currently in Pain? No/denies               [facility] PT Assessment - 03/24/20 0001      Assessment   Medical Diagnosis Traumatic complete tear of left RTC    Referring Provider (PT) Harvest Dark MD    Onset Date/Surgical Date 02/27/20    Next MD Visit 04/08/2020      Precautions   Required Braces or Orthoses Sling                         [facility] Adult PT Treatment/Exercise - 03/24/20 0001      Modalities   Modalities Vasopneumatic      Vasopneumatic   Number Minutes Vasopneumatic  10 minutes    Vasopnuematic Location  Shoulder    Vasopneumatic Pressure Low    Vasopneumatic Temperature  34      Manual Therapy   Manual Therapy Passive ROM    Passive ROM PROM of L shoulder into flexion, ER, IR with gentle holds at end range                       PT Long Term Goals - 03/05/20 1719  PT LONG TERM GOAL #1   Title Independent with a HEP.    Time 8    Period Weeks    Status New      PT LONG TERM GOAL #2   Title Perform ADL's with pain not > 3/10.    Time 8    Period Weeks    Status New      PT LONG TERM GOAL #3   Title Active left shoulder flexion to 145 degrees so the patient can easily reach overhead.    Time 8    Period Weeks    Status New      PT LONG TERM GOAL #4   Title Active ER to 70 degrees+ to allow for easily donning/doffing of apparel.    Time 8    Period Weeks    Status New      PT LONG TERM GOAL #5   Title Increase ROM so patient is able to reach behind back to L3.    Time 8    Period Weeks    Status New      PT LONG TERM GOAL #6   Title Increase shoulder strength to a solid 4 to 4+/5 to increase stability for performance of functional activities.    Time 8    Period Weeks    Status New                 Plan - 03/24/20 1724    Clinical Impression Statement Patient presented in clinic with no current L shoulder pain. Patient did report some anterior L shoulder pain during PROM during eccentric flexion. Intermittant oscillations provided  throughout PROM session of L shoulder. Firm end feels and smooth arc of motion noted during PROM of L shoulder in all directions. Normal vasopnuematic response noted following removal of the modality.    Personal Factors and Comorbidities Finances    Examination-Activity Limitations Other    Stability/Clinical Decision Making Stable/Uncomplicated    Rehab Potential Excellent    PT Treatment/Interventions ADLs/Self Care Home Management;Cryotherapy;Electrical Stimulation;Therapeutic exercise;Manual techniques;Patient/family education;Vasopneumatic Device;Ultrasound;Passive range of motion    PT Next Visit Plan Begin with gentle left shoulder PROM.  Please review HEP.  Vasopneumatic on low with pillow between left elbow and thorax.    Consulted and Agree with Plan of Care Patient           Patient will benefit from skilled therapeutic intervention in order to improve the following deficits and impairments:  Pain, Decreased activity tolerance, Increased edema, Decreased range of motion  Visit Diagnosis: Acute pain of left shoulder  Stiffness of left shoulder, not elsewhere classified  Localized edema     Problem List Patient Active Problem List   Diagnosis Date Noted  . Atypical chest pain 08/15/2018  . Hyperlipidemia 08/15/2018  . Family history of heart disease 08/15/2018  . Hypertension 05/31/2018  . Hypothyroidism 05/31/2018  . Acute pancreatitis 05/31/2018  . Mild renal insufficiency 05/31/2018    Standley Brooking, PTA 03/24/2020, 5:37 PM  [facility] [address] [place], [place], [address] Phone: [phone]   Fax:  [phone]  Name: Joel Kim MRN: [id number] Date of Birth: 02/27/1946

## 2020-03-26 ENCOUNTER — Other Ambulatory Visit: Payer: Self-pay

## 2020-03-26 ENCOUNTER — Ambulatory Visit: Payer: Medicare HMO | Attending: Orthopedic Surgery | Admitting: Physical Therapy

## 2020-03-26 ENCOUNTER — Encounter: Payer: Self-pay | Admitting: Physical Therapy

## 2020-03-26 DIAGNOSIS — M25512 Pain in left shoulder: Secondary | ICD-10-CM | POA: Diagnosis not present

## 2020-03-26 DIAGNOSIS — R600 Localized edema: Secondary | ICD-10-CM | POA: Insufficient documentation

## 2020-03-26 DIAGNOSIS — M25612 Stiffness of left shoulder, not elsewhere classified: Secondary | ICD-10-CM | POA: Diagnosis present

## 2020-03-26 NOTE — Therapy (Signed)
[place], [place], [address] Phone: [phone]   Fax:  [phone]  Physical Therapy Treatment  Patient Details  Name: Joel Kim MRN: [id number] Date of Birth: 1946/03/28 Referring Provider (PT): Harvest Dark MD   Encounter Date: 03/26/2020   PT End of Session - 03/26/20 1640    Visit Number 7    Number of Visits 18    Date for PT Re-Evaluation 04/30/20    Authorization Type FOTO    PT Start Time 1640    PT Stop Time 1718    PT Time Calculation (min) 38 min    Equipment Utilized During Treatment Other (comment)   sling with adductor squeeze   Activity Tolerance Patient tolerated treatment well    Behavior During Therapy [facility] for tasks assessed/performed           Past Medical History:  Diagnosis Date  . Asthma   . Atypical nevus 01/26/2006   right side of body (slight)  . Basal cell carcinoma 11/17/2015   left ear rim tx cx3 57fu  . Cancer ([place])   . Diabetes mellitus without complication ([place])   . Hypertension   . Hypothyroidism   . Insomnia   . Intractable migraine without aura   . Melanoma ([place]) 10/10/2014   right ant. shoulder (exc)  . Pancreatitis   . Spondylosis of lumbar spine   . Stroke ([place])   . Thyroid disease   . TIA (transient ischemic attack)     Past Surgical History:  Procedure Laterality Date  . BACK SURGERY    . CHOLECYSTECTOMY    . FOOT SURGERY    . JOINT REPLACEMENT    . rightt knee replacement      There were no vitals filed for this visit.   Subjective Assessment - 03/26/20 1639    Subjective COVID 19 screening performed on patient upon arrival. No new complaints.    Patient is accompained by: Family member   Wife   Pertinent History DM, TIA, back and foot surgery, thyroid problem.    Patient Stated Goals Use left UE without pain.    Currently in Pain? No/denies              [facility] PT Assessment - 03/26/20 0001      Assessment   Medical Diagnosis  Traumatic complete tear of left RTC    Referring Provider (PT) Harvest Dark MD    Onset Date/Surgical Date 02/27/20    Next MD Visit 04/08/2020      Precautions   Required Braces or Orthoses Sling                         [facility] Adult PT Treatment/Exercise - 03/26/20 0001      Modalities   Modalities Vasopneumatic      Vasopneumatic   Number Minutes Vasopneumatic  10 minutes    Vasopnuematic Location  Shoulder    Vasopneumatic Pressure Low    Vasopneumatic Temperature  34      Manual Therapy   Manual Therapy Passive ROM    Passive ROM PROM of L shoulder into flexion, ER, IR with gentle holds at end range                       PT Long Term Goals - 03/05/20 1719      PT LONG TERM GOAL #1   Title Independent with a HEP.  Time 8    Period Weeks    Status New      PT LONG TERM GOAL #2   Title Perform ADL's with pain not > 3/10.    Time 8    Period Weeks    Status New      PT LONG TERM GOAL #3   Title Active left shoulder flexion to 145 degrees so the patient can easily reach overhead.    Time 8    Period Weeks    Status New      PT LONG TERM GOAL #4   Title Active ER to 70 degrees+ to allow for easily donning/doffing of apparel.    Time 8    Period Weeks    Status New      PT LONG TERM GOAL #5   Title Increase ROM so patient is able to reach behind back to L3.    Time 8    Period Weeks    Status New      PT LONG TERM GOAL #6   Title Increase shoulder strength to a solid 4 to 4+/5 to increase stability for performance of functional activities.    Time 8    Period Weeks    Status New                 Plan - 03/26/20 1710    Clinical Impression Statement Patient presented in clinic with reports of no L shoulder pain upon arrival. Patient noted that during PROM he did have some discomfort in L deltoids and proximal bicep region but was very minimal. Patient reports less L shoulder pain during eccentric flexion. Intermittant  oscillations completed throughout PROM session to reduce muscle spasms. Firm end feels and smooth arc of motion noted during PROM of L shoulder. Normal vasopneumatic response noted following removal of the modality.    Personal Factors and Comorbidities Finances    Examination-Activity Limitations Other    Examination-Participation Restrictions Other    Stability/Clinical Decision Making Stable/Uncomplicated    Rehab Potential Excellent    PT Treatment/Interventions ADLs/Self Care Home Management;Cryotherapy;Electrical Stimulation;Therapeutic exercise;Manual techniques;Patient/family education;Vasopneumatic Device;Ultrasound;Passive range of motion    PT Next Visit Plan Begin with gentle left shoulder PROM.  Please review HEP.  Vasopneumatic on low with pillow between left elbow and thorax.    Consulted and Agree with Plan of Care Patient           Patient will benefit from skilled therapeutic intervention in order to improve the following deficits and impairments:  Pain, Decreased activity tolerance, Increased edema, Decreased range of motion  Visit Diagnosis: Acute pain of left shoulder  Stiffness of left shoulder, not elsewhere classified  Localized edema     Problem List Patient Active Problem List   Diagnosis Date Noted  . Atypical chest pain 08/15/2018  . Hyperlipidemia 08/15/2018  . Family history of heart disease 08/15/2018  . Hypertension 05/31/2018  . Hypothyroidism 05/31/2018  . Acute pancreatitis 05/31/2018  . Mild renal insufficiency 05/31/2018    Standley Brooking, PTA 03/26/2020, 5:37 PM  [facility] [address] [place], [place], [address] Phone: [phone]   Fax:  [phone]  Name: Joel Kim MRN: [id number] Date of Birth: Feb 12, 1946

## 2020-04-01 ENCOUNTER — Other Ambulatory Visit: Payer: Self-pay

## 2020-04-01 ENCOUNTER — Ambulatory Visit: Payer: Medicare HMO | Admitting: Physical Therapy

## 2020-04-01 ENCOUNTER — Encounter: Payer: Self-pay | Admitting: Physical Therapy

## 2020-04-01 DIAGNOSIS — R600 Localized edema: Secondary | ICD-10-CM

## 2020-04-01 DIAGNOSIS — M25512 Pain in left shoulder: Secondary | ICD-10-CM

## 2020-04-01 DIAGNOSIS — M25612 Stiffness of left shoulder, not elsewhere classified: Secondary | ICD-10-CM

## 2020-04-01 NOTE — Therapy (Signed)
[facility] [place], [place], [address] Phone: [phone]   Fax:  [phone]  Physical Therapy Treatment  Patient Details  Name: BEVAN DISNEY MRN: [id number] Date of Birth: [birth year] Referring Provider (PT): Harvest Dark MD   Encounter Date: 04/01/2020   PT End of Session - 04/01/20 1119    Visit Number 8    Number of Visits 18    Date for PT Re-Evaluation 04/30/20    Authorization Type FOTO    PT Start Time 1119    PT Stop Time 1158    PT Time Calculation (min) 39 min    Equipment Utilized During Treatment Other (comment)   sling with adductor pillow   Activity Tolerance Patient tolerated treatment well    Behavior During Therapy [facility] for tasks assessed/performed           Past Medical History:  Diagnosis Date  . Asthma   . Atypical nevus 01/26/2006   right side of body (slight)  . Basal cell carcinoma 11/17/2015   left ear rim tx cx3 23fu  . Cancer ([employer])   . Diabetes mellitus without complication ([place])   . Hypertension   . Hypothyroidism   . Insomnia   . Intractable migraine without aura   . Melanoma ([place]) 10/10/2014   right ant. shoulder (exc)  . Pancreatitis   . Spondylosis of lumbar spine   . Stroke ([place])   . Thyroid disease   . TIA (transient ischemic attack)     Past Surgical History:  Procedure Laterality Date  . BACK SURGERY    . CHOLECYSTECTOMY    . FOOT SURGERY    . JOINT REPLACEMENT    . rightt knee replacement      There were no vitals filed for this visit.   Subjective Assessment - 04/01/20 1117    Subjective COVID 19 screening performed on patient upon arrival. No new complaints.    Pertinent History DM, TIA, back and foot surgery, thyroid problem.    Patient Stated Goals Use left UE without pain.    Currently in Pain? Yes    Pain Score 4     Pain Location Shoulder    Pain Orientation Left    Pain Descriptors / Indicators Discomfort    Pain Type Surgical pain    Pain  Onset More than a month ago    Pain Frequency Intermittent              [facility] PT Assessment - 04/01/20 0001      Assessment   Medical Diagnosis Traumatic complete tear of left RTC    Referring Provider (PT) Harvest Dark MD    Onset Date/Surgical Date 02/27/20    Next MD Visit 04/08/2020      Precautions   Required Braces or Orthoses Sling                         [facility] Adult PT Treatment/Exercise - 04/01/20 0001      Modalities   Modalities Vasopneumatic      Vasopneumatic   Number Minutes Vasopneumatic  10 minutes    Vasopnuematic Location  Shoulder    Vasopneumatic Pressure Low    Vasopneumatic Temperature  34      Manual Therapy   Manual Therapy Passive ROM    Passive ROM PROM of L shoulder into flexion, ER, IR with gentle holds at end range  PT Long Term Goals - 03/05/20 1719      PT LONG TERM GOAL #1   Title Independent with a HEP.    Time 8    Period Weeks    Status New      PT LONG TERM GOAL #2   Title Perform ADL's with pain not > 3/10.    Time 8    Period Weeks    Status New      PT LONG TERM GOAL #3   Title Active left shoulder flexion to 145 degrees so the patient can easily reach overhead.    Time 8    Period Weeks    Status New      PT LONG TERM GOAL #4   Title Active ER to 70 degrees+ to allow for easily donning/doffing of apparel.    Time 8    Period Weeks    Status New      PT LONG TERM GOAL #5   Title Increase ROM so patient is able to reach behind back to L3.    Time 8    Period Weeks    Status New      PT LONG TERM GOAL #6   Title Increase shoulder strength to a solid 4 to 4+/5 to increase stability for performance of functional activities.    Time 8    Period Weeks    Status New                 Plan - 04/01/20 1205    Clinical Impression Statement Patient presented in clinic with L shoulder discomfort in deltoids region. Firm end feels and smooth arc of motion  noted during PROM of L shoulder. Minimal oscillations required during treatment with no complaints of pain during PROM session. Normal vasopneumatic response noted following removal of the modality.    Personal Factors and Comorbidities Finances    Examination-Activity Limitations Other    Examination-Participation Restrictions Other    Stability/Clinical Decision Making Stable/Uncomplicated    Rehab Potential Excellent    PT Treatment/Interventions ADLs/Self Care Home Management;Cryotherapy;Electrical Stimulation;Therapeutic exercise;Manual techniques;Patient/family education;Vasopneumatic Device;Ultrasound;Passive range of motion    PT Next Visit Plan Begin with gentle left shoulder PROM.  Please review HEP.  Vasopneumatic on low with pillow between left elbow and thorax.    Consulted and Agree with Plan of Care Patient           Patient will benefit from skilled therapeutic intervention in order to improve the following deficits and impairments:  Pain, Decreased activity tolerance, Increased edema, Decreased range of motion  Visit Diagnosis: Acute pain of left shoulder  Stiffness of left shoulder, not elsewhere classified  Localized edema     Problem List Patient Active Problem List   Diagnosis Date Noted  . Atypical chest pain 08/15/2018  . Hyperlipidemia 08/15/2018  . Family history of heart disease 08/15/2018  . Hypertension 05/31/2018  . Hypothyroidism 05/31/2018  . Acute pancreatitis 05/31/2018  . Mild renal insufficiency 05/31/2018    Standley Brooking, PTA 04/01/2020, 12:12 PM  [facility] [address] [place], [place], [address] Phone: [phone]   Fax:  [phone]  Name: NAYEL PURDY MRN: [id number] Date of Birth: 1946/05/07

## 2020-04-03 ENCOUNTER — Other Ambulatory Visit: Payer: Self-pay

## 2020-04-03 ENCOUNTER — Encounter: Payer: Self-pay | Admitting: Physical Therapy

## 2020-04-03 ENCOUNTER — Ambulatory Visit: Payer: Medicare HMO | Admitting: Physical Therapy

## 2020-04-03 DIAGNOSIS — M25512 Pain in left shoulder: Secondary | ICD-10-CM | POA: Diagnosis not present

## 2020-04-03 DIAGNOSIS — R600 Localized edema: Secondary | ICD-10-CM

## 2020-04-03 DIAGNOSIS — M25612 Stiffness of left shoulder, not elsewhere classified: Secondary | ICD-10-CM

## 2020-04-03 NOTE — Therapy (Signed)
[facility] [place], [place], [address] Phone: [phone]   Fax:  [phone]  Physical Therapy Treatment  Patient Details  Name: Joel Kim MRN: [id number] Date of Birth: 08/18/1946 Referring Provider (PT): Harvest Dark MD   Encounter Date: 04/03/2020   PT End of Session - 04/03/20 1120    Visit Number 9    Number of Visits 18    Date for PT Re-Evaluation 04/30/20    Authorization Type FOTO    PT Start Time 1121    PT Stop Time 1200    PT Time Calculation (min) 39 min    Activity Tolerance Patient tolerated treatment well    Behavior During Therapy [facility] for tasks assessed/performed           Past Medical History:  Diagnosis Date  . Asthma   . Atypical nevus 01/26/2006   right side of body (slight)  . Basal cell carcinoma 11/17/2015   left ear rim tx cx3 59fu  . Cancer ([place])   . Diabetes mellitus without complication ([place])   . Hypertension   . Hypothyroidism   . Insomnia   . Intractable migraine without aura   . Melanoma ([place]) 10/10/2014   right ant. shoulder (exc)  . Pancreatitis   . Spondylosis of lumbar spine   . Stroke ([place])   . Thyroid disease   . TIA (transient ischemic attack)     Past Surgical History:  Procedure Laterality Date  . BACK SURGERY    . CHOLECYSTECTOMY    . FOOT SURGERY    . JOINT REPLACEMENT    . rightt knee replacement      There were no vitals filed for this visit.   Subjective Assessment - 04/03/20 1120    Subjective COVID 19 screening performed on patient upon arrival. Reports 4-5/10 L shoulder pain upon arrival.    Pertinent History DM, TIA, back and foot surgery, thyroid problem.    Patient Stated Goals Use left UE without pain.    Currently in Pain? Yes    Pain Score 4     Pain Location Shoulder    Pain Orientation Left    Pain Descriptors / Indicators Discomfort    Pain Type Surgical pain    Pain Onset More than a month ago    Pain Frequency Constant               [facility] PT Assessment - 04/03/20 0001      Assessment   Medical Diagnosis Traumatic complete tear of left RTC    Referring Provider (PT) Harvest Dark MD    Onset Date/Surgical Date 02/27/20    Next MD Visit 04/08/2020      Precautions   Required Braces or Orthoses Sling                         [facility] Adult PT Treatment/Exercise - 04/03/20 0001      Modalities   Modalities Vasopneumatic      Vasopneumatic   Number Minutes Vasopneumatic  10 minutes    Vasopnuematic Location  Shoulder    Vasopneumatic Pressure Low    Vasopneumatic Temperature  34      Manual Therapy   Manual Therapy Passive ROM    Passive ROM PROM of L shoulder into flexion, ER, IR with gentle holds at end range  PT Long Term Goals - 03/05/20 1719      PT LONG TERM GOAL #1   Title Independent with a HEP.    Time 8    Period Weeks    Status New      PT LONG TERM GOAL #2   Title Perform ADL's with pain not > 3/10.    Time 8    Period Weeks    Status New      PT LONG TERM GOAL #3   Title Active left shoulder flexion to 145 degrees so the patient can easily reach overhead.    Time 8    Period Weeks    Status New      PT LONG TERM GOAL #4   Title Active ER to 70 degrees+ to allow for easily donning/doffing of apparel.    Time 8    Period Weeks    Status New      PT LONG TERM GOAL #5   Title Increase ROM so patient is able to reach behind back to L3.    Time 8    Period Weeks    Status New      PT LONG TERM GOAL #6   Title Increase shoulder strength to a solid 4 to 4+/5 to increase stability for performance of functional activities.    Time 8    Period Weeks    Status New                 Plan - 04/03/20 1205    Clinical Impression Statement Patient presented in clinic with reports of moderate L shoulder discomfort. Patient able to tolerate PROM well with firm end feels and smooth arc of motion in all directions. No  reports of popping or pain during the session. Normal modalities response noted following removal of the modalities.    Personal Factors and Comorbidities Finances    Examination-Activity Limitations Other    Examination-Participation Restrictions Other    Stability/Clinical Decision Making Stable/Uncomplicated    Rehab Potential Excellent    PT Treatment/Interventions ADLs/Self Care Home Management;Cryotherapy;Electrical Stimulation;Therapeutic exercise;Manual techniques;Patient/family education;Vasopneumatic Device;Ultrasound;Passive range of motion    PT Next Visit Plan 6 weeks post op 04/09/2020.    Consulted and Agree with Plan of Care Patient           Patient will benefit from skilled therapeutic intervention in order to improve the following deficits and impairments:  Pain, Decreased activity tolerance, Increased edema, Decreased range of motion  Visit Diagnosis: Acute pain of left shoulder  Stiffness of left shoulder, not elsewhere classified  Localized edema     Problem List Patient Active Problem List   Diagnosis Date Noted  . Atypical chest pain 08/15/2018  . Hyperlipidemia 08/15/2018  . Family history of heart disease 08/15/2018  . Hypertension 05/31/2018  . Hypothyroidism 05/31/2018  . Acute pancreatitis 05/31/2018  . Mild renal insufficiency 05/31/2018    Standley Brooking, PTA 04/03/2020, 12:11 PM  [facility] [address] [place], [place], [address] Phone: [phone]   Fax:  [phone]  Name: Joel Kim MRN: [id number] Date of Birth: [birth year]

## 2020-04-07 ENCOUNTER — Encounter: Payer: Self-pay | Admitting: Physical Therapy

## 2020-04-07 ENCOUNTER — Ambulatory Visit: Payer: Medicare HMO | Admitting: Physical Therapy

## 2020-04-07 ENCOUNTER — Other Ambulatory Visit: Payer: Self-pay

## 2020-04-07 DIAGNOSIS — M25512 Pain in left shoulder: Secondary | ICD-10-CM

## 2020-04-07 DIAGNOSIS — R600 Localized edema: Secondary | ICD-10-CM

## 2020-04-07 DIAGNOSIS — M25612 Stiffness of left shoulder, not elsewhere classified: Secondary | ICD-10-CM

## 2020-04-07 NOTE — Therapy (Signed)
[facility] [place], [place], [address] Phone: [phone]   Fax:  [phone]  Physical Therapy Treatment  Patient Details  Name: Joel Kim MRN: [id number] Date of Birth: 03/04/1946 Referring Provider (PT): Harvest Dark MD   Encounter Date: 04/07/2020   PT End of Session - 04/07/20 1124    Visit Number 10    Number of Visits 18    Date for PT Re-Evaluation 04/30/20    Authorization Type FOTO    PT Start Time 1124    PT Stop Time 1203    PT Time Calculation (min) 39 min    Equipment Utilized During Treatment Other (comment)   sling with adductor pillow   Activity Tolerance Patient tolerated treatment well    Behavior During Therapy [facility] for tasks assessed/performed           Past Medical History:  Diagnosis Date  . Asthma   . Atypical nevus 01/26/2006   right side of body (slight)  . Basal cell carcinoma 11/17/2015   left ear rim tx cx3 60fu  . Cancer ([place])   . Diabetes mellitus without complication ([place])   . Hypertension   . Hypothyroidism   . Insomnia   . Intractable migraine without aura   . Melanoma ([place]) 10/10/2014   right ant. shoulder (exc)  . Pancreatitis   . Spondylosis of lumbar spine   . Stroke ([place])   . Thyroid disease   . TIA (transient ischemic attack)     Past Surgical History:  Procedure Laterality Date  . BACK SURGERY    . CHOLECYSTECTOMY    . FOOT SURGERY    . JOINT REPLACEMENT    . rightt knee replacement      There were no vitals filed for this visit.   Subjective Assessment - 04/07/20 1123    Subjective COVID 19 screening performed on patient upon arrival. Reports 4-5/10 L shoulder pain upon arrival.    Patient is accompained by: Family member   Wife, Mechele Claude   Pertinent History DM, TIA, back and foot surgery, thyroid problem.    Patient Stated Goals Use left UE without pain.    Currently in Pain? Yes    Pain Score 4     Pain Location Shoulder    Pain Orientation  Left    Pain Descriptors / Indicators Discomfort    Pain Type Surgical pain    Pain Onset More than a month ago    Pain Frequency Constant              [facility] PT Assessment - 04/07/20 0001      Assessment   Medical Diagnosis Traumatic complete tear of left RTC    Referring Provider (PT) Harvest Dark MD    Onset Date/Surgical Date 02/27/20    Next MD Visit 04/08/2020      Precautions   Required Braces or Orthoses Sling      Observation/Other Assessments   Focus on Therapeutic Outcomes (FOTO)  47% limitation, CK status      ROM / Strength   AROM / PROM / Strength PROM      PROM   Overall PROM  Within functional limits for tasks performed    PROM Assessment Site Shoulder    Right/Left Shoulder Left    Left Shoulder Flexion 150 Degrees    Left Shoulder Internal Rotation 75 Degrees    Left Shoulder External Rotation 73 Degrees  [facility] Adult PT Treatment/Exercise - 04/07/20 0001      Modalities   Modalities Vasopneumatic      Vasopneumatic   Number Minutes Vasopneumatic  10 minutes    Vasopnuematic Location  Shoulder    Vasopneumatic Pressure Low    Vasopneumatic Temperature  34      Manual Therapy   Manual Therapy Passive ROM    Passive ROM PROM of L shoulder into flexion, ER, IR with gentle holds at end range                       PT Long Term Goals - 04/07/20 1155      PT LONG TERM GOAL #1   Title Independent with a HEP.    Time 8    Period Weeks    Status On-going      PT LONG TERM GOAL #2   Title Perform ADL's with pain not > 3/10.    Time 8    Period Weeks    Status On-going      PT LONG TERM GOAL #3   Title Active left shoulder flexion to 145 degrees so the patient can easily reach overhead.    Time 8    Period Weeks    Status On-going      PT LONG TERM GOAL #4   Title Active ER to 70 degrees+ to allow for easily donning/doffing of apparel.    Time 8    Period Weeks    Status On-going       PT LONG TERM GOAL #5   Title Increase ROM so patient is able to reach behind back to L3.    Time 8    Period Weeks    Status On-going      PT LONG TERM GOAL #6   Title Increase shoulder strength to a solid 4 to 4+/5 to increase stability for performance of functional activities.    Time 8    Period Weeks    Status On-going                 Plan - 04/07/20 1206    Clinical Impression Statement Patient presented in clinic with reports of mid range L shoulder discomfort. Patient and wife both reported he took pain medication prior to coming to PT. Patient has overall been able to demonstrate great PROM of L shoulder with greatest reports of discomfort at L deltoids. Normal vasopneumatic response noted following removal of the modalities.    Personal Factors and Comorbidities Finances    Examination-Activity Limitations Other    Examination-Participation Restrictions Other    Stability/Clinical Decision Making Stable/Uncomplicated    Rehab Potential Excellent    PT Treatment/Interventions ADLs/Self Care Home Management;Cryotherapy;Electrical Stimulation;Therapeutic exercise;Manual techniques;Patient/family education;Vasopneumatic Device;Ultrasound;Passive range of motion    PT Next Visit Plan 6 weeks post op 04/09/2020.    Consulted and Agree with Plan of Care Patient           Patient will benefit from skilled therapeutic intervention in order to improve the following deficits and impairments:  Pain, Decreased activity tolerance, Increased edema, Decreased range of motion  Visit Diagnosis: Acute pain of left shoulder  Stiffness of left shoulder, not elsewhere classified  Localized edema     Problem List Patient Active Problem List   Diagnosis Date Noted  . Atypical chest pain 08/15/2018  . Hyperlipidemia 08/15/2018  . Family history of heart disease 08/15/2018  . Hypertension 05/31/2018  . Hypothyroidism 05/31/2018  .  Acute pancreatitis 05/31/2018  . Mild renal  insufficiency 05/31/2018    Standley Brooking, PTA 04/07/20 12:12 PM   [facility] [place], [place], [address] Phone: [phone]   Fax:  [phone]  Name: Joel Kim MRN: [id number] Date of Birth: 01-Sep-1946

## 2020-04-09 ENCOUNTER — Other Ambulatory Visit: Payer: Self-pay

## 2020-04-09 ENCOUNTER — Ambulatory Visit: Payer: Medicare HMO | Admitting: Physical Therapy

## 2020-04-09 ENCOUNTER — Encounter: Payer: Self-pay | Admitting: Physical Therapy

## 2020-04-09 DIAGNOSIS — M25512 Pain in left shoulder: Secondary | ICD-10-CM

## 2020-04-09 DIAGNOSIS — R600 Localized edema: Secondary | ICD-10-CM

## 2020-04-09 DIAGNOSIS — M25612 Stiffness of left shoulder, not elsewhere classified: Secondary | ICD-10-CM

## 2020-04-09 NOTE — Therapy (Signed)
[facility] [place], [place], [address] Phone: [phone]   Fax:  [phone]  Physical Therapy Treatment  Patient Details  Name: Joel Kim MRN: [id number] Date of Birth: July 03, 1946 Referring Provider (PT): Harvest Dark MD   Encounter Date: 04/09/2020   PT End of Session - 04/09/20 1200    Visit Number 11    Number of Visits 18    Date for PT Re-Evaluation 04/30/20    Authorization Type FOTO    PT Start Time 1122    PT Stop Time 1158    PT Time Calculation (min) 36 min    Equipment Utilized During Treatment Other (comment)   sling   Activity Tolerance Patient tolerated treatment well    Behavior During Therapy [facility] for tasks assessed/performed           Past Medical History:  Diagnosis Date  . Asthma   . Atypical nevus 01/26/2006   right side of body (slight)  . Basal cell carcinoma 11/17/2015   left ear rim tx cx3 24fu  . Cancer ([place])   . Diabetes mellitus without complication ([place])   . Hypertension   . Hypothyroidism   . Insomnia   . Intractable migraine without aura   . Melanoma ([place]) 10/10/2014   right ant. shoulder (exc)  . Pancreatitis   . Spondylosis of lumbar spine   . Stroke ([place])   . Thyroid disease   . TIA (transient ischemic attack)     Past Surgical History:  Procedure Laterality Date  . BACK SURGERY    . CHOLECYSTECTOMY    . FOOT SURGERY    . JOINT REPLACEMENT    . rightt knee replacement      There were no vitals filed for this visit.   Subjective Assessment - 04/09/20 1208    Subjective COVID 19 screening performed on patient upon arrival. MD was very pleased with his ROM.    Patient is accompained by: Family member   Wife, Joel Kim   Pertinent History DM, TIA, back and foot surgery, thyroid problem.    Patient Stated Goals Use left UE without pain.    Currently in Pain? No/denies              Joel Kim - [facility] PT Assessment - 04/09/20 0001      Assessment   Medical  Diagnosis Traumatic complete tear of left RTC    Referring Provider (PT) Harvest Dark MD    Onset Date/Surgical Date 02/27/20    Next MD Visit 04/08/2020      Precautions   Required Braces or Orthoses Sling                         [facility] Adult PT Treatment/Exercise - 04/09/20 0001      Modalities   Modalities Vasopneumatic      Vasopneumatic   Number Minutes Vasopneumatic  10 minutes    Vasopnuematic Location  Shoulder    Vasopneumatic Pressure Low    Vasopneumatic Temperature  34      Manual Therapy   Manual Therapy Passive ROM    Passive ROM PROM of L shoulder into flexion, ER, IR with gentle holds at end range                  PT Education - 04/09/20 1200    Education Details HEP- table slides into flexion    Person(s) Educated Patient;Spouse    Methods Explanation;Demonstration;Handout    Comprehension  Verbalized understanding               PT Long Term Goals - 04/07/20 1155      PT LONG TERM GOAL #1   Title Independent with a HEP.    Time 8    Period Weeks    Status On-going      PT LONG TERM GOAL #2   Title Perform ADL's with pain not > 3/10.    Time 8    Period Weeks    Status On-going      PT LONG TERM GOAL #3   Title Active left shoulder flexion to 145 degrees so the patient can easily reach overhead.    Time 8    Period Weeks    Status On-going      PT LONG TERM GOAL #4   Title Active ER to 70 degrees+ to allow for easily donning/doffing of apparel.    Time 8    Period Weeks    Status On-going      PT LONG TERM GOAL #5   Title Increase ROM so patient is able to reach behind back to L3.    Time 8    Period Weeks    Status On-going      PT LONG TERM GOAL #6   Title Increase shoulder strength to a solid 4 to 4+/5 to increase stability for performance of functional activities.    Time 8    Period Weeks    Status On-going                 Plan - 04/09/20 1201    Clinical Impression Statement Patient  presented in clinic with great report from MD visit yesterday as he was very pleased with his ROM. Patient able to tolerate PROM session very well with no reports of pain. Patient no long has adductor pillow but does have to wear sling for two more weeks. Firm end feels and smooth arc of motion noted during PROM in all direction. Normal vasopnuematic response noted following removal of the modality. Patient and spouse educated regarding new HEP for table slides and both verbalized understanding.    Personal Factors and Comorbidities Finances    Examination-Activity Limitations Other    Examination-Participation Restrictions Other    Stability/Clinical Decision Making Stable/Uncomplicated    Rehab Potential Excellent    PT Treatment/Interventions ADLs/Self Care Home Management;Cryotherapy;Electrical Stimulation;Therapeutic exercise;Manual techniques;Patient/family education;Vasopneumatic Device;Ultrasound;Passive range of motion    PT Next Visit Plan Begin AAROM except pulleys next visit.    Consulted and Agree with Plan of Care Patient           Patient will benefit from skilled therapeutic intervention in order to improve the following deficits and impairments:  Pain, Decreased activity tolerance, Increased edema, Decreased range of motion  Visit Diagnosis: Acute pain of left shoulder  Stiffness of left shoulder, not elsewhere classified  Localized edema     Problem List Patient Active Problem List   Diagnosis Date Noted  . Atypical chest pain 08/15/2018  . Hyperlipidemia 08/15/2018  . Family history of heart disease 08/15/2018  . Hypertension 05/31/2018  . Hypothyroidism 05/31/2018  . Acute pancreatitis 05/31/2018  . Mild renal insufficiency 05/31/2018    Standley Brooking, PTA 04/09/2020, 12:09 PM  [facility] [address] [place], [place], [address] Phone: [phone]   Fax:  [phone]  Name: [name] MRN:  [id number] Date of Birth: 09-23-1946

## 2020-04-14 ENCOUNTER — Other Ambulatory Visit: Payer: Self-pay

## 2020-04-14 ENCOUNTER — Encounter: Payer: Self-pay | Admitting: Physical Therapy

## 2020-04-14 ENCOUNTER — Ambulatory Visit: Payer: Medicare HMO | Admitting: Physical Therapy

## 2020-04-14 DIAGNOSIS — M25512 Pain in left shoulder: Secondary | ICD-10-CM | POA: Diagnosis not present

## 2020-04-14 DIAGNOSIS — R600 Localized edema: Secondary | ICD-10-CM

## 2020-04-14 DIAGNOSIS — M25612 Stiffness of left shoulder, not elsewhere classified: Secondary | ICD-10-CM

## 2020-04-14 NOTE — Therapy (Signed)
[facility] [place], [place], [address] Phone: [phone]   Fax:  [phone]  Physical Therapy Treatment  Patient Details  Name: Joel Kim MRN: [id number] Date of Birth: 1946-07-16 Referring Provider (PT): Harvest Dark MD   Encounter Date: 04/14/2020   PT End of Session - 04/14/20 1119    Visit Number 12    Number of Visits 18    Date for PT Re-Evaluation 04/30/20    Authorization Type FOTO    PT Start Time 1115    PT Stop Time 1158    PT Time Calculation (min) 43 min    Equipment Utilized During Treatment Other (comment)   Sling   Activity Tolerance Patient tolerated treatment well    Behavior During Therapy [facility] for tasks assessed/performed           Past Medical History:  Diagnosis Date  . Asthma   . Atypical nevus 01/26/2006   right side of body (slight)  . Basal cell carcinoma 11/17/2015   left ear rim tx cx3 76fu  . Cancer ([place])   . Diabetes mellitus without complication ([place])   . Hypertension   . Hypothyroidism   . Insomnia   . Intractable migraine without aura   . Melanoma ([place]) 10/10/2014   right ant. shoulder (exc)  . Pancreatitis   . Spondylosis of lumbar spine   . Stroke ([place])   . Thyroid disease   . TIA (transient ischemic attack)     Past Surgical History:  Procedure Laterality Date  . BACK SURGERY    . CHOLECYSTECTOMY    . FOOT SURGERY    . JOINT REPLACEMENT    . rightt knee replacement      There were no vitals filed for this visit.   Subjective Assessment - 04/14/20 1119    Subjective COVID 19 screening performed on patient upon arrival. No new complaints.    Pertinent History DM, TIA, back and foot surgery, thyroid problem.    Patient Stated Goals Use left UE without pain.    Currently in Pain? No/denies              [facility] PT Assessment - 04/14/20 0001      Assessment   Medical Diagnosis Traumatic complete tear of left RTC    Referring Provider (PT) Harvest Dark MD    Onset Date/Surgical Date 02/27/20    Next MD Visit 04/08/2020      Precautions   Required Braces or Orthoses Sling                         [facility] Adult PT Treatment/Exercise - 04/14/20 0001      Exercises   Exercises Shoulder      Shoulder Exercises: Supine   Protraction AAROM;Both;Limitations   Attempted but unable secondary to weakness and deltoid pain     Shoulder Exercises: Pulleys   Flexion 5 minutes      Shoulder Exercises: ROM/Strengthening   Ranger seated UE ranger into flex, circles x2 min each      Modalities   Modalities Vasopneumatic      Vasopneumatic   Number Minutes Vasopneumatic  10 minutes    Vasopnuematic Location  Shoulder    Vasopneumatic Pressure Low    Vasopneumatic Temperature  34      Manual Therapy   Manual Therapy Passive ROM    Passive ROM PROM of L shoulder into flexion, ER, IR with gentle holds at  end range                       PT Long Term Goals - 04/07/20 1155      PT LONG TERM GOAL #1   Title Independent with a HEP.    Time 8    Period Weeks    Status On-going      PT LONG TERM GOAL #2   Title Perform ADL's with pain not > 3/10.    Time 8    Period Weeks    Status On-going      PT LONG TERM GOAL #3   Title Active left shoulder flexion to 145 degrees so the patient can easily reach overhead.    Time 8    Period Weeks    Status On-going      PT LONG TERM GOAL #4   Title Active ER to 70 degrees+ to allow for easily donning/doffing of apparel.    Time 8    Period Weeks    Status On-going      PT LONG TERM GOAL #5   Title Increase ROM so patient is able to reach behind back to L3.    Time 8    Period Weeks    Status On-going      PT LONG TERM GOAL #6   Title Increase shoulder strength to a solid 4 to 4+/5 to increase stability for performance of functional activities.    Time 8    Period Weeks    Status On-going                 Plan - 04/14/20 1152    Clinical  Impression Statement Patient presented in clinic today with no complaints of pain. Patient has noted that sling without adductor pillow has made a difference in comfort level. Patient introduced to light AAROM exercises as surgical note was assessed and no bicep work was completed. Patient did report discomfort wihth pulleys into flexion. Patient limited with supine AAROM protraction secondary to weakness and pain in L deltoids. Firm end feels and smooth arc of motion noted during PROM session. Normal vasopneumatic response noted following removal of the modality.    Personal Factors and Comorbidities Finances    Examination-Activity Limitations Other    Examination-Participation Restrictions Other    Stability/Clinical Decision Making Stable/Uncomplicated    Rehab Potential Excellent    PT Treatment/Interventions ADLs/Self Care Home Management;Cryotherapy;Electrical Stimulation;Therapeutic exercise;Manual techniques;Patient/family education;Vasopneumatic Device;Ultrasound;Passive range of motion    PT Next Visit Plan Continue slow progression into AAROM next visit.    PT Home Exercise Plan Table slides    Consulted and Agree with Plan of Care Patient           Patient will benefit from skilled therapeutic intervention in order to improve the following deficits and impairments:  Pain, Decreased activity tolerance, Increased edema, Decreased range of motion  Visit Diagnosis: Acute pain of left shoulder  Stiffness of left shoulder, not elsewhere classified  Localized edema     Problem List Patient Active Problem List   Diagnosis Date Noted  . Atypical chest pain 08/15/2018  . Hyperlipidemia 08/15/2018  . Family history of heart disease 08/15/2018  . Hypertension 05/31/2018  . Hypothyroidism 05/31/2018  . Acute pancreatitis 05/31/2018  . Mild renal insufficiency 05/31/2018    Standley Brooking, PTA 04/14/2020, 12:15 PM  [facility] [address] [place], [place], [address] Phone: [phone]   Fax:  [phone]  Name: Joel Kim MRN: [id number] Date of Birth: Jan 07, 1946

## 2020-04-16 ENCOUNTER — Other Ambulatory Visit: Payer: Self-pay

## 2020-04-16 ENCOUNTER — Ambulatory Visit: Payer: Medicare HMO | Admitting: Physical Therapy

## 2020-04-16 ENCOUNTER — Encounter: Payer: Self-pay | Admitting: Physical Therapy

## 2020-04-16 DIAGNOSIS — R600 Localized edema: Secondary | ICD-10-CM

## 2020-04-16 DIAGNOSIS — M25512 Pain in left shoulder: Secondary | ICD-10-CM

## 2020-04-16 DIAGNOSIS — M25612 Stiffness of left shoulder, not elsewhere classified: Secondary | ICD-10-CM

## 2020-04-16 NOTE — Therapy (Signed)
Joel Kim, [place], [address] Phone: [phone]   Fax:  [phone]  Physical Therapy Treatment  Patient Details  Name: Joel Kim MRN: [id number] Date of Birth: 07-02-1946 Referring Provider (PT): Harvest Dark MD   Encounter Date: 04/16/2020   PT End of Session - 04/16/20 1038    Visit Number 13    Number of Visits 18    Date for PT Re-Evaluation 04/30/20    Authorization Type FOTO    PT Start Time 1036    PT Stop Time 1117    PT Time Calculation (min) 41 min    Equipment Utilized During Treatment Other (comment)   sling   Activity Tolerance Patient tolerated treatment well    Behavior During Therapy [facility] for tasks assessed/performed           Past Medical History:  Diagnosis Date  . Asthma   . Atypical nevus 01/26/2006   right side of body (slight)  . Basal cell carcinoma 11/17/2015   left ear rim tx cx3 55fu  . Cancer ([place])   . Diabetes mellitus without complication ([place])   . Hypertension   . Hypothyroidism   . Insomnia   . Intractable migraine without aura   . Melanoma ([place]) 10/10/2014   right ant. shoulder (exc)  . Pancreatitis   . Spondylosis of lumbar spine   . Stroke ([place])   . Thyroid disease   . TIA (transient ischemic attack)     Past Surgical History:  Procedure Laterality Date  . BACK SURGERY    . CHOLECYSTECTOMY    . FOOT SURGERY    . JOINT REPLACEMENT    . rightt knee replacement      There were no vitals filed for this visit.   Subjective Assessment - 04/16/20 1038    Subjective COVID 19 screening performed on patient upon arrival. Reports mowing some yesterday in riding lawnmower and used his L shoulder some.    Pertinent History DM, TIA, back and foot surgery, thyroid problem.    Patient Stated Goals Use left UE without pain.    Currently in Pain? Yes    Pain Score 5     Pain Location Shoulder    Pain Orientation Left;Upper    Pain Descriptors / Indicators  Discomfort    Pain Type Surgical pain    Pain Onset More than a month ago    Pain Frequency Constant              [facility] PT Assessment - 04/16/20 0001      Assessment   Medical Diagnosis Traumatic complete tear of left RTC    Referring Provider (PT) Harvest Dark MD    Onset Date/Surgical Date 02/27/20    Next MD Visit 05/20/2020      Precautions   Required Braces or Orthoses Sling                         [facility] Adult PT Treatment/Exercise - 04/16/20 0001      Shoulder Exercises: Supine   Protraction AAROM;Both;15 reps    Flexion AAROM;Both;15 reps      Shoulder Exercises: Pulleys   Flexion 5 minutes      Shoulder Exercises: ROM/Strengthening   Ranger seated UE ranger into flex, circles x3 min each    Other ROM/Strengthening Exercises Wall ladder x5 reps (max at 21)      Modalities   Modalities Cryotherapy;Printmaker  Cryotherapy   Number Minutes Cryotherapy 10 Minutes    Cryotherapy Location Shoulder    Type of Cryotherapy Ice pack      Electrical Stimulation   Electrical Stimulation Location L proximal shoulder    Electrical Stimulation Action Pre-Mod    Electrical Stimulation Parameters 80-150 hz x10 min    Electrical Stimulation Goals Edema;Pain      Manual Therapy   Manual Therapy Passive ROM    Passive ROM PROM of L shoulder into flexion, ER, IR with gentle holds at end range                       PT Long Term Goals - 04/07/20 1155      PT LONG TERM GOAL #1   Title Independent with a HEP.    Time 8    Period Weeks    Status On-going      PT LONG TERM GOAL #2   Title Perform ADL's with pain not > 3/10.    Time 8    Period Weeks    Status On-going      PT LONG TERM GOAL #3   Title Active left shoulder flexion to 145 degrees so the patient can easily reach overhead.    Time 8    Period Weeks    Status On-going      PT LONG TERM GOAL #4   Title Active ER to 70 degrees+ to allow for easily  donning/doffing of apparel.    Time 8    Period Weeks    Status On-going      PT LONG TERM GOAL #5   Title Increase ROM so patient is able to reach behind back to L3.    Time 8    Period Weeks    Status On-going      PT LONG TERM GOAL #6   Title Increase shoulder strength to a solid 4 to 4+/5 to increase stability for performance of functional activities.    Time 8    Period Weeks    Status On-going                 Plan - 04/16/20 1113    Clinical Impression Statement Patient presented in clinic with reports of mowing his yard yesterday on riding mower and used his L shoulder some. Patient then reported today's pain as 5/10. Patient progressed with more AAROM exericses and reported mod to min assist with supine AAROM exercises. Patient reporting painful arc at approximately 80-110 deg during exercises. Firm end feels and smooth arc of motion noted during PROM session. Normal modalities response noted following removal of the modalities.    Personal Factors and Comorbidities Finances    Examination-Activity Limitations Other    Examination-Participation Restrictions Other    Stability/Clinical Decision Making Stable/Uncomplicated    Rehab Potential Excellent    PT Treatment/Interventions ADLs/Self Care Home Management;Cryotherapy;Electrical Stimulation;Therapeutic exercise;Manual techniques;Patient/family education;Vasopneumatic Device;Ultrasound;Passive range of motion    PT Next Visit Plan Continue slow progression into AAROM next visit.    PT Home Exercise Plan Table slides    Consulted and Agree with Plan of Care Patient           Patient will benefit from skilled therapeutic intervention in order to improve the following deficits and impairments:  Pain, Decreased activity tolerance, Increased edema, Decreased range of motion  Visit Diagnosis: Acute pain of left shoulder  Stiffness of left shoulder, not elsewhere classified  Localized edema  Problem  List Patient Active Problem List   Diagnosis Date Noted  . Atypical chest pain 08/15/2018  . Hyperlipidemia 08/15/2018  . Family history of heart disease 08/15/2018  . Hypertension 05/31/2018  . Hypothyroidism 05/31/2018  . Acute pancreatitis 05/31/2018  . Mild renal insufficiency 05/31/2018    Standley Brooking, PTA 04/16/2020, 11:32 AM  [facility] [address] [place], [place], [address] Phone: [phone]   Fax:  [phone]  Name: FONTAINE HEHL MRN: [id number] Date of Birth: 1946/05/06

## 2020-04-20 ENCOUNTER — Encounter: Payer: Self-pay | Admitting: Physical Therapy

## 2020-04-20 ENCOUNTER — Ambulatory Visit: Payer: Medicare HMO | Admitting: Physical Therapy

## 2020-04-20 ENCOUNTER — Other Ambulatory Visit: Payer: Self-pay

## 2020-04-20 DIAGNOSIS — M25512 Pain in left shoulder: Secondary | ICD-10-CM

## 2020-04-20 DIAGNOSIS — M25612 Stiffness of left shoulder, not elsewhere classified: Secondary | ICD-10-CM

## 2020-04-20 DIAGNOSIS — R600 Localized edema: Secondary | ICD-10-CM

## 2020-04-20 NOTE — Therapy (Signed)
[facility] [place], [place], [address] Phone: [phone]   Fax:  [phone]  Physical Therapy Treatment  Patient Details  Name: Joel Kim MRN: [id number] Date of Birth: [birth year] Referring Provider (PT): Harvest Dark MD   Encounter Date: 04/20/2020   PT End of Session - 04/20/20 1122    Visit Number 14    Number of Visits 18    Date for PT Re-Evaluation 04/30/20    Authorization Type FOTO    PT Start Time 1119    PT Stop Time 1158    PT Time Calculation (min) 39 min    Activity Tolerance Patient tolerated treatment well    Behavior During Therapy [facility] for tasks assessed/performed           Past Medical History:  Diagnosis Date  . Asthma   . Atypical nevus 01/26/2006   right side of body (slight)  . Basal cell carcinoma 11/17/2015   left ear rim tx cx3 91fu  . Cancer ([place])   . Diabetes mellitus without complication ([place])   . Hypertension   . Hypothyroidism   . Insomnia   . Intractable migraine without aura   . Melanoma ([place]) 10/10/2014   right ant. shoulder (exc)  . Pancreatitis   . Spondylosis of lumbar spine   . Stroke ([place])   . Thyroid disease   . TIA (transient ischemic attack)     Past Surgical History:  Procedure Laterality Date  . BACK SURGERY    . CHOLECYSTECTOMY    . FOOT SURGERY    . JOINT REPLACEMENT    . rightt knee replacement      There were no vitals filed for this visit.   Subjective Assessment - 04/20/20 1121    Subjective COVID 19 screening performed on patient upon arrival. Reports difficulty with anything high and no strength per patient report.    Pertinent History DM, TIA, back and foot surgery, thyroid problem.    Patient Stated Goals Use left UE without pain.    Currently in Pain? Yes    Pain Score 5     Pain Location Shoulder    Pain Orientation Left    Pain Descriptors / Indicators Discomfort    Pain Type Surgical pain    Pain Onset More than a month ago     Pain Frequency Constant              [facility] PT Assessment - 04/20/20 0001      Assessment   Medical Diagnosis Traumatic complete tear of left RTC    Referring Provider (PT) Harvest Dark MD    Onset Date/Surgical Date 02/27/20    Next MD Visit 05/20/2020      Precautions   Required Braces or Orthoses Sling                         [facility] Adult PT Treatment/Exercise - 04/20/20 0001      Shoulder Exercises: Supine   Protraction AAROM;Both;20 reps    Flexion AAROM;Both;20 reps      Shoulder Exercises: Standing   Other Standing Exercises Wall ladder x6 reps (max 19-20)      Shoulder Exercises: Pulleys   Flexion 5 minutes      Shoulder Exercises: ROM/Strengthening   Ranger seated UE ranger into flex x4 min      Modalities   Modalities Vasopneumatic      Vasopneumatic   Number Minutes Vasopneumatic  10  minutes    Vasopnuematic Location  Shoulder    Vasopneumatic Pressure Low    Vasopneumatic Temperature  34/pain      Manual Therapy   Manual Therapy Passive ROM    Passive ROM PROM of L shoulder into flexion, ER, IR with gentle holds at end range                       PT Long Term Goals - 04/07/20 1155      PT LONG TERM GOAL #1   Title Independent with a HEP.    Time 8    Period Weeks    Status On-going      PT LONG TERM GOAL #2   Title Perform ADL's with pain not > 3/10.    Time 8    Period Weeks    Status On-going      PT LONG TERM GOAL #3   Title Active left shoulder flexion to 145 degrees so the patient can easily reach overhead.    Time 8    Period Weeks    Status On-going      PT LONG TERM GOAL #4   Title Active ER to 70 degrees+ to allow for easily donning/doffing of apparel.    Time 8    Period Weeks    Status On-going      PT LONG TERM GOAL #5   Title Increase ROM so patient is able to reach behind back to L3.    Time 8    Period Weeks    Status On-going      PT LONG TERM GOAL #6   Title Increase shoulder  strength to a solid 4 to 4+/5 to increase stability for performance of functional activities.    Time 8    Period Weeks    Status On-going                 Plan - 04/20/20 1205    Clinical Impression Statement Patient presented in clinic with reports of mid level L shoulder pain. Patient progressed through [facility] exercises with only reports of weakness. Patient required min assist from PTA for supine AAROM protraction but not during AAROM flexion. Firm end feels and smooth arc of motion noted during PROM of L shoulder. 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 Normal vasopneumatic response noted following removal of the modality.    Personal Factors and Comorbidities Finances    Examination-Activity Limitations Other    Examination-Participation Restrictions Other    Stability/Clinical Decision Making Stable/Uncomplicated    Rehab Potential Excellent    PT Treatment/Interventions ADLs/Self Care Home Management;Cryotherapy;Electrical Stimulation;Therapeutic exercise;Manual techniques;Patient/family education;Vasopneumatic Device;Ultrasound;Passive range of motion    PT Next Visit Plan Continue slow progression into AAROM next visit.    PT Home Exercise Plan Table slides    Consulted and Agree with Plan of Care Patient           Patient will benefit from skilled therapeutic intervention in order to improve the following deficits and impairments:  Pain, Decreased activity tolerance, Increased edema, Decreased range of motion  Visit Diagnosis: Acute pain of left shoulder  Stiffness of left shoulder, not elsewhere classified  Localized edema     Problem List Patient Active Problem List   Diagnosis Date Noted  . Atypical chest pain 08/15/2018  . Hyperlipidemia 08/15/2018  . Family history of heart disease 08/15/2018  . Hypertension  05/31/2018  . Hypothyroidism 05/31/2018  . Acute pancreatitis  05/31/2018  . Mild renal insufficiency 05/31/2018    Joel Kim, PTA 04/20/2020, 12:10 PM  [facility] [address] [place], [place], [address] Phone: [phone]   Fax:  [phone]  Name: Joel Kim MRN: [id number] Date of Birth: September 01, 1946

## 2020-04-21 ENCOUNTER — Ambulatory Visit: Payer: Medicare HMO | Admitting: Physical Therapy

## 2020-04-22 ENCOUNTER — Encounter: Payer: Self-pay | Admitting: Physical Therapy

## 2020-04-22 ENCOUNTER — Other Ambulatory Visit: Payer: Self-pay

## 2020-04-22 ENCOUNTER — Ambulatory Visit: Payer: Medicare HMO | Admitting: Dermatology

## 2020-04-22 ENCOUNTER — Encounter: Payer: Self-pay | Admitting: Dermatology

## 2020-04-22 ENCOUNTER — Ambulatory Visit: Payer: Medicare HMO | Admitting: Physical Therapy

## 2020-04-22 DIAGNOSIS — S70362A Insect bite (nonvenomous), left thigh, initial encounter: Secondary | ICD-10-CM | POA: Diagnosis not present

## 2020-04-22 DIAGNOSIS — L570 Actinic keratosis: Secondary | ICD-10-CM | POA: Diagnosis not present

## 2020-04-22 DIAGNOSIS — Z85828 Personal history of other malignant neoplasm of skin: Secondary | ICD-10-CM

## 2020-04-22 DIAGNOSIS — D229 Melanocytic nevi, unspecified: Secondary | ICD-10-CM

## 2020-04-22 DIAGNOSIS — S80861A Insect bite (nonvenomous), right lower leg, initial encounter: Secondary | ICD-10-CM

## 2020-04-22 DIAGNOSIS — D485 Neoplasm of uncertain behavior of skin: Secondary | ICD-10-CM | POA: Diagnosis not present

## 2020-04-22 DIAGNOSIS — Z85820 Personal history of malignant melanoma of skin: Secondary | ICD-10-CM

## 2020-04-22 DIAGNOSIS — R600 Localized edema: Secondary | ICD-10-CM

## 2020-04-22 DIAGNOSIS — M25512 Pain in left shoulder: Secondary | ICD-10-CM | POA: Diagnosis not present

## 2020-04-22 DIAGNOSIS — W57XXXA Bitten or stung by nonvenomous insect and other nonvenomous arthropods, initial encounter: Secondary | ICD-10-CM

## 2020-04-22 DIAGNOSIS — S40862A Insect bite (nonvenomous) of left upper arm, initial encounter: Secondary | ICD-10-CM

## 2020-04-22 DIAGNOSIS — M25612 Stiffness of left shoulder, not elsewhere classified: Secondary | ICD-10-CM

## 2020-04-22 DIAGNOSIS — S80862A Insect bite (nonvenomous), left lower leg, initial encounter: Secondary | ICD-10-CM

## 2020-04-22 MED ORDER — CLOBETASOL PROPIONATE 0.05 % EX OINT: 1.0000 "application " | TOPICAL_OINTMENT | Freq: Two times a day (BID) | CUTANEOUS | 0 refills | Status: DC

## 2020-04-22 NOTE — Patient Instructions (Addendum)
Follow-up for Joel Kim date of birth 11-Oct-1945.  This visit was prompted by a nonhealing sore on the left inner helix three quarters of the way up.  He has history of few years ago of a nonmelanoma skin cancer on the same ear.  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                                     A smaller nodule outside the left I was also biopsied; although this may not be a skin cancer the proximity to the eye mandated being certain.  Crust on the left arm fits a precancer and was treated with liquid nitrogen freeze.  There is no sign of new or recurrent melanoma and no swollen glands in the area.  Itchy bite-like bumps on the left upper leg will be treated with topical clobetasol which she can apply after bathing 1 or 2 times daily for up to a week.  Unfortunately with their love of their pet dog I suspect  he will be subject to more bites.

## 2020-04-22 NOTE — Therapy (Signed)
[facility] [place], [place], [address] Phone: [phone]   Fax:  [phone]  Physical Therapy Treatment  Patient Details  Name: Joel Kim MRN: [id number] Date of Birth: 1945-11-22 Referring Provider (PT): Harvest Dark MD   Encounter Date: 04/22/2020   PT End of Session - 04/22/20 1121    Visit Number 15    Number of Visits 18    Date for PT Re-Evaluation 04/30/20    Authorization Type FOTO    PT Start Time 1116    PT Stop Time 1210    PT Time Calculation (min) 54 min    Activity Tolerance Patient tolerated treatment well    Behavior During Therapy [facility] for tasks assessed/performed           Past Medical History:  Diagnosis Date  . Asthma   . Atypical nevus 01/26/2006   right side of body (slight)  . Basal cell carcinoma 11/17/2015   left ear rim tx cx3 24fu  . Cancer ([place])   . Diabetes mellitus without complication ([place])   . Hypertension   . Hypothyroidism   . Insomnia   . Intractable migraine without aura   . Melanoma ([employer]) 10/10/2014   right ant. shoulder (exc)  . Pancreatitis   . Spondylosis of lumbar spine   . Stroke ([place])   . Thyroid disease   . TIA (transient ischemic attack)     Past Surgical History:  Procedure Laterality Date  . BACK SURGERY    . CHOLECYSTECTOMY    . FOOT SURGERY    . JOINT REPLACEMENT    . rightt knee replacement      There were no vitals filed for this visit.   Subjective Assessment - 04/22/20 1119    Subjective COVID 19 screening performed on patient upon arrival. Denies any pain except for during PT session.    Pertinent History DM, TIA, back and foot surgery, thyroid problem.    Patient Stated Goals Use left UE without pain.    Currently in Pain? No/denies              [facility] PT Assessment - 04/22/20 0001      Assessment   Medical Diagnosis Traumatic complete tear of left RTC    Referring Provider (PT) Harvest Dark MD    Onset Date/Surgical  Date 02/27/20    Next MD Visit 05/20/2020      Observation/Other Assessments   Focus on Therapeutic Outcomes (FOTO)  39% limitation, CJ status      ROM / Strength   AROM / PROM / Strength --      PROM   Overall PROM  Within functional limits for tasks performed    PROM Assessment Site Shoulder    Right/Left Shoulder Left    Left Shoulder Flexion 160 Degrees    Left Shoulder Internal Rotation 75 Degrees    Left Shoulder External Rotation 75 Degrees                         [facility] Adult PT Treatment/Exercise - 04/22/20 0001      Shoulder Exercises: Supine   Protraction AAROM;Both;20 reps    Protraction Limitations min assist required from PTA    External Rotation AAROM;Left;20 reps    Flexion AAROM;Both;20 reps      Shoulder Exercises: Standing   Other Standing Exercises Wall ladder x5 reps (max 20)      Shoulder Exercises: Pulleys   Flexion 5  minutes      Shoulder Exercises: ROM/Strengthening   Ranger Standing into flexion x20 reps, CCW circles x20 reps      Modalities   Modalities Vasopneumatic      Vasopneumatic   Number Minutes Vasopneumatic  15 minutes    Vasopnuematic Location  Shoulder    Vasopneumatic Pressure Low    Vasopneumatic Temperature  34/pain      Manual Therapy   Manual Therapy Passive ROM    Passive ROM PROM of L shoulder into flexion, ER, IR with gentle holds at end range                       PT Long Term Goals - 04/07/20 1155      PT LONG TERM GOAL #1   Title Independent with a HEP.    Time 8    Period Weeks    Status On-going      PT LONG TERM GOAL #2   Title Perform ADL's with pain not > 3/10.    Time 8    Period Weeks    Status On-going      PT LONG TERM GOAL #3   Title Active left shoulder flexion to 145 degrees so the patient can easily reach overhead.    Time 8    Period Weeks    Status On-going      PT LONG TERM GOAL #4   Title Active ER to 70 degrees+ to allow for easily donning/doffing of  apparel.    Time 8    Period Weeks    Status On-going      PT LONG TERM GOAL #5   Title Increase ROM so patient is able to reach behind back to L3.    Time 8    Period Weeks    Status On-going      PT LONG TERM GOAL #6   Title Increase shoulder strength to a solid 4 to 4+/5 to increase stability for performance of functional activities.    Time 8    Period Weeks    Status On-going                 Plan - 04/22/20 1152    Clinical Impression Statement Patient presented in clinic with only reports of discomfort with therex. Patient progressed to some AAROM in standing with minimal rest break due to fatigue against gravity. Patient has tolerated AAROM exercises well in supine with PVC and able to demonstrate great ROM. Firm end feels and smooth arc of motion noted during PROM in all directions. All PROM measurements provided in today's note were obtained in supine WNL. Normal vasopneumatic response noted following removal of the modality.    Personal Factors and Comorbidities Finances    Examination-Activity Limitations Other    Examination-Participation Restrictions Other    Stability/Clinical Decision Making Stable/Uncomplicated    Rehab Potential Excellent    PT Treatment/Interventions ADLs/Self Care Home Management;Cryotherapy;Electrical Stimulation;Therapeutic exercise;Manual techniques;Patient/family education;Vasopneumatic Device;Ultrasound;Passive range of motion    PT Next Visit Plan Continue AAROM progression.    PT Home Exercise Plan Table slides    Consulted and Agree with Plan of Care Patient           Patient will benefit from skilled therapeutic intervention in order to improve the following deficits and impairments:  Pain, Decreased activity tolerance, Increased edema, Decreased range of motion  Visit Diagnosis: Acute pain of left shoulder  Stiffness of left shoulder, not elsewhere classified  Localized  edema     Problem List Patient Active Problem  List   Diagnosis Date Noted  . Atypical chest pain 08/15/2018  . Hyperlipidemia 08/15/2018  . Family history of heart disease 08/15/2018  . Hypertension 05/31/2018  . Hypothyroidism 05/31/2018  . Acute pancreatitis 05/31/2018  . Mild renal insufficiency 05/31/2018    Standley Brooking, PTA 04/22/2020, 12:12 PM  [facility] [place], [place], [address] Phone: [phone]   Fax:  [phone]  Name: Joel Kim MRN: [id number] Date of Birth: [birth year]

## 2020-04-23 ENCOUNTER — Ambulatory Visit: Payer: Medicare HMO | Admitting: Physician Assistant

## 2020-04-23 ENCOUNTER — Encounter: Payer: Medicare HMO | Admitting: Physical Therapy

## 2020-04-27 ENCOUNTER — Telehealth: Payer: Self-pay | Admitting: Dermatology

## 2020-04-27 NOTE — Telephone Encounter (Signed)
Phone call to patients wife to let her know that his pathology is not back yet. She will check back in a few days.

## 2020-04-27 NOTE — Telephone Encounter (Signed)
Patient's wife is calling for pathology results from last office visit with Lavonna Monarch, MD.

## 2020-04-28 ENCOUNTER — Other Ambulatory Visit: Payer: Self-pay

## 2020-04-28 ENCOUNTER — Ambulatory Visit: Payer: Medicare HMO | Attending: Orthopedic Surgery | Admitting: Physical Therapy

## 2020-04-28 DIAGNOSIS — M25512 Pain in left shoulder: Secondary | ICD-10-CM | POA: Insufficient documentation

## 2020-04-28 DIAGNOSIS — R600 Localized edema: Secondary | ICD-10-CM | POA: Diagnosis present

## 2020-04-28 DIAGNOSIS — M25612 Stiffness of left shoulder, not elsewhere classified: Secondary | ICD-10-CM | POA: Diagnosis present

## 2020-04-28 NOTE — Therapy (Signed)
[facility] [place], [place], [address] Phone: [phone]   Fax:  [phone]  Physical Therapy Treatment  Patient Details  Name: Joel Kim MRN: [id number] Date of Birth: 1946/02/09 Referring Provider (PT): Harvest Dark MD   Encounter Date: 04/28/2020   PT End of Session - 04/28/20 1153    Visit Number 16    Number of Visits 22    Date for PT Re-Evaluation 05/21/20    Authorization Type FOTO    PT Start Time 1116    PT Stop Time 1158    PT Time Calculation (min) 42 min           Past Medical History:  Diagnosis Date  . Asthma   . Atypical nevus 01/26/2006   right side of body (slight)  . Basal cell carcinoma 11/17/2015   left ear rim tx cx3 25fu  . Cancer ([place])   . Diabetes mellitus without complication ([place])   . Hypertension   . Hypothyroidism   . Insomnia   . Intractable migraine without aura   . Melanoma ([place]) 10/10/2014   right ant. shoulder (exc)  . Pancreatitis   . Spondylosis of lumbar spine   . Stroke ([place])   . Thyroid disease   . TIA (transient ischemic attack)     Past Surgical History:  Procedure Laterality Date  . BACK SURGERY    . CHOLECYSTECTOMY    . FOOT SURGERY    . JOINT REPLACEMENT    . rightt knee replacement      There were no vitals filed for this visit.   Subjective Assessment - 04/28/20 1123    Subjective COVID-19 screen performed prior to patient entering clinic.  No new complaints.    Patient is accompained by: Family member    Pertinent History DM, TIA, back and foot surgery, thyroid problem.    Patient Stated Goals Use left UE without pain.    Currently in Pain? Yes    Pain Score 5     Pain Location Shoulder    Pain Orientation Left    Pain Descriptors / Indicators Discomfort    Pain Type Surgical pain    Pain Onset More than a month ago                             [facility] Adult PT Treatment/Exercise - 04/28/20 0001      Exercises    Exercises Shoulder      Shoulder Exercises: Pulleys   Flexion 5 minutes    Other Pulley Exercises Wall ladder x 5 minutes and UE Ranger on wall x 5 minutes.      Vasopneumatic   Number Minutes Vasopneumatic  15 minutes    Vasopnuematic Location  --   Left shoulder.   Vasopneumatic Pressure Low      Manual Therapy   Manual Therapy Passive ROM    Passive ROM PROM to patient's left shoulder x 8 minutes into flexion, ER and IR                       PT Long Term Goals - 04/07/20 1155      PT LONG TERM GOAL #1   Title Independent with a HEP.    Time 8    Period Weeks    Status On-going      PT LONG TERM GOAL #2   Title Perform ADL's with pain not >  3/10.    Time 8    Period Weeks    Status On-going      PT LONG TERM GOAL #3   Title Active left shoulder flexion to 145 degrees so the patient can easily reach overhead.    Time 8    Period Weeks    Status On-going      PT LONG TERM GOAL #4   Title Active ER to 70 degrees+ to allow for easily donning/doffing of apparel.    Time 8    Period Weeks    Status On-going      PT LONG TERM GOAL #5   Title Increase ROM so patient is able to reach behind back to L3.    Time 8    Period Weeks    Status On-going      PT LONG TERM GOAL #6   Title Increase shoulder strength to a solid 4 to 4+/5 to increase stability for performance of functional activities.    Time 8    Period Weeks    Status On-going                 Plan - 04/28/20 1151    Clinical Impression Statement Patient is making excellent progress.  His left shoulder PROM is essentially full range at this time.    Personal Factors and Comorbidities Finances    Examination-Activity Limitations Other    Examination-Participation Restrictions Other    Stability/Clinical Decision Making Stable/Uncomplicated    Rehab Potential Excellent    PT Treatment/Interventions ADLs/Self Care Home Management;Cryotherapy;Electrical Stimulation;Therapeutic  exercise;Manual techniques;Patient/family education;Vasopneumatic Device;Ultrasound;Passive range of motion    PT Next Visit Plan Continue AAROM progression.    PT Home Exercise Plan Table slides    Consulted and Agree with Plan of Care Patient           Patient will benefit from skilled therapeutic intervention in order to improve the following deficits and impairments:  Pain, Decreased activity tolerance, Increased edema, Decreased range of motion  Visit Diagnosis: Acute pain of left shoulder - Plan: PT plan of care cert/re-cert  Stiffness of left shoulder, not elsewhere classified - Plan: PT plan of care cert/re-cert  Localized edema - Plan: PT plan of care cert/re-cert     Problem List Patient Active Problem List   Diagnosis Date Noted  . Atypical chest pain 08/15/2018  . Hyperlipidemia 08/15/2018  . Family history of heart disease 08/15/2018  . Hypertension 05/31/2018  . Hypothyroidism 05/31/2018  . Acute pancreatitis 05/31/2018  . Mild renal insufficiency 05/31/2018    [name], [name] 04/28/2020, 11:59 AM  [facility] [place], [place], [address] Phone: [phone]   Fax:  [phone]  Name: Joel Kim MRN: [id number] Date of Birth: 10/31/1945

## 2020-04-29 ENCOUNTER — Telehealth: Payer: Self-pay | Admitting: Dermatology

## 2020-04-29 NOTE — Telephone Encounter (Signed)
Patient's spouse is calling to get pathology results from patient's last visit with Lavonna Monarch, MD.

## 2020-04-30 ENCOUNTER — Encounter: Payer: Self-pay | Admitting: Physical Therapy

## 2020-04-30 ENCOUNTER — Ambulatory Visit: Payer: Medicare HMO | Admitting: Physical Therapy

## 2020-04-30 ENCOUNTER — Other Ambulatory Visit: Payer: Self-pay

## 2020-04-30 DIAGNOSIS — M25512 Pain in left shoulder: Secondary | ICD-10-CM | POA: Diagnosis not present

## 2020-04-30 DIAGNOSIS — R600 Localized edema: Secondary | ICD-10-CM

## 2020-04-30 DIAGNOSIS — M25612 Stiffness of left shoulder, not elsewhere classified: Secondary | ICD-10-CM

## 2020-04-30 NOTE — Telephone Encounter (Signed)
Both are nonmelanoma skin cancers with very high cure rates.  Dr. Darene Lamer recommends Mohs surgery for the ear and he could schedule with me for the one on the face.

## 2020-04-30 NOTE — Therapy (Signed)
[facility] [place], [place], [address] Phone: [phone]   Fax:  [phone]  Physical Therapy Treatment  Patient Details  Name: Joel Kim MRN: [id number] Date of Birth: 06-21-1946 Referring Provider (PT): Harvest Dark MD   Encounter Date: 04/30/2020   PT End of Session - 04/30/20 1035    Visit Number 17    Number of Visits 22    Date for PT Re-Evaluation 05/21/20    Authorization Type FOTO    PT Start Time 1033    PT Stop Time 1116    PT Time Calculation (min) 43 min    Activity Tolerance Patient tolerated treatment well    Behavior During Therapy [facility] for tasks assessed/performed           Past Medical History:  Diagnosis Date  . Asthma   . Atypical nevus 01/26/2006   right side of body (slight)  . Basal cell carcinoma 11/17/2015   left ear rim tx cx3 86fu  . Cancer ([place])   . Diabetes mellitus without complication ([place])   . Hypertension   . Hypothyroidism   . Insomnia   . Intractable migraine without aura   . Melanoma ([place]) 10/10/2014   right ant. shoulder (exc)  . Pancreatitis   . Spondylosis of lumbar spine   . Stroke ([place])   . Thyroid disease   . TIA (transient ischemic attack)     Past Surgical History:  Procedure Laterality Date  . BACK SURGERY    . CHOLECYSTECTOMY    . FOOT SURGERY    . JOINT REPLACEMENT    . rightt knee replacement      There were no vitals filed for this visit.   Subjective Assessment - 04/30/20 1034    Subjective COVID-19 screen performed prior to patient entering clinic.  No new complaints.    Pertinent History DM, TIA, back and foot surgery, thyroid problem.    Patient Stated Goals Use left UE without pain.    Currently in Pain? Yes    Pain Score 5     Pain Location Shoulder    Pain Orientation Left    Pain Descriptors / Indicators Discomfort    Pain Type Surgical pain    Pain Onset More than a month ago    Pain Frequency Intermittent               [facility] PT Assessment - 04/30/20 0001      Assessment   Medical Diagnosis Traumatic complete tear of left RTC    Referring Provider (PT) Harvest Dark MD    Onset Date/Surgical Date 02/27/20    Next MD Visit 05/20/2020                         [facility] Adult PT Treatment/Exercise - 04/30/20 0001      Shoulder Exercises: Supine   Flexion AROM;Left;20 reps   lawnchair and supine position     Shoulder Exercises: Pulleys   Flexion 5 minutes      Shoulder Exercises: ROM/Strengthening   Ranger Standing into flexion x20 reps, CCW circles x20 reps    Wall Wash flex x20 reps, circles x20 reps      Shoulder Exercises: Isometric Strengthening   Flexion 5X5"    Extension 5X5"    External Rotation 5X5"    Internal Rotation 5X5"      Modalities   Modalities Vasopneumatic      Vasopneumatic   Number Minutes  Vasopneumatic  10 minutes    Vasopnuematic Location  Shoulder    Vasopneumatic Pressure Low    Vasopneumatic Temperature  34/pain      Manual Therapy   Manual Therapy Passive ROM;Soft tissue mobilization    Soft tissue mobilization STW to L deltoids to reduce pain     Passive ROM PROM to patient's left shoulder x 8 minutes into flexion, ER and IR                       PT Long Term Goals - 04/07/20 1155      PT LONG TERM GOAL #1   Title Independent with a HEP.    Time 8    Period Weeks    Status On-going      PT LONG TERM GOAL #2   Title Perform ADL's with pain not > 3/10.    Time 8    Period Weeks    Status On-going      PT LONG TERM GOAL #3   Title Active left shoulder flexion to 145 degrees so the patient can easily reach overhead.    Time 8    Period Weeks    Status On-going      PT LONG TERM GOAL #4   Title Active ER to 70 degrees+ to allow for easily donning/doffing of apparel.    Time 8    Period Weeks    Status On-going      PT LONG TERM GOAL #5   Title Increase ROM so patient is able to reach behind back to L3.     Time 8    Period Weeks    Status On-going      PT LONG TERM GOAL #6   Title Increase shoulder strength to a solid 4 to 4+/5 to increase stability for performance of functional activities.    Time 8    Period Weeks    Status On-going                 Plan - 04/30/20 1128    Clinical Impression Statement Patient progressed to light strengthening and more antigravity ROM with moderate limitation noted with AROM in lawnchair and supine. No limitation reported with light isometric strengthening. Patient's L bicep not reattached during surgery after it was previously detached which could be main limitation with supine and lawnchair AROM. Firm end feels and smooth arc of motion noted during PROM of L shoulder. Minimal tone palpable in L deltoid upon palpation. Normal vasopneumatic response noted following removal of the modality.    Personal Factors and Comorbidities Finances    Examination-Activity Limitations Other    Stability/Clinical Decision Making Stable/Uncomplicated    Rehab Potential Excellent    PT Treatment/Interventions ADLs/Self Care Home Management;Cryotherapy;Electrical Stimulation;Therapeutic exercise;Manual techniques;Patient/family education;Vasopneumatic Device;Ultrasound;Passive range of motion    PT Next Visit Plan Continue AAROM progression.    PT Home Exercise Plan Table slides    Consulted and Agree with Plan of Care Patient           Patient will benefit from skilled therapeutic intervention in order to improve the following deficits and impairments:  Pain, Decreased activity tolerance, Increased edema, Decreased range of motion  Visit Diagnosis: Acute pain of left shoulder  Stiffness of left shoulder, not elsewhere classified  Localized edema     Problem List Patient Active Problem List   Diagnosis Date Noted  . Atypical chest pain 08/15/2018  . Hyperlipidemia 08/15/2018  . Family history of  heart disease 08/15/2018  . Hypertension 05/31/2018  .  Hypothyroidism 05/31/2018  . Acute pancreatitis 05/31/2018  . Mild renal insufficiency 05/31/2018    Standley Brooking, PTA 04/30/2020, 11:34 AM  [facility] [address] [place], [place], [address] Phone: [phone]   Fax:  [phone]  Name: Joel Kim MRN: [id number] Date of Birth: [birth year]

## 2020-05-04 NOTE — Telephone Encounter (Signed)
-----   Message from Warren Danes, [place] sent at 05/01/2020 11:00 AM EDT ----- Mohs x please.  Thanks

## 2020-05-04 NOTE — Telephone Encounter (Signed)
Patient working with skin surgery to get appointment that works with travel.

## 2020-05-05 ENCOUNTER — Ambulatory Visit: Payer: Medicare HMO | Admitting: Physical Therapy

## 2020-05-05 ENCOUNTER — Encounter: Payer: Self-pay | Admitting: Physical Therapy

## 2020-05-05 ENCOUNTER — Other Ambulatory Visit: Payer: Self-pay

## 2020-05-05 DIAGNOSIS — M25512 Pain in left shoulder: Secondary | ICD-10-CM

## 2020-05-05 DIAGNOSIS — M25612 Stiffness of left shoulder, not elsewhere classified: Secondary | ICD-10-CM

## 2020-05-05 DIAGNOSIS — R600 Localized edema: Secondary | ICD-10-CM

## 2020-05-05 NOTE — Therapy (Signed)
[facility] [place], [place], [address] Phone: [phone]   Fax:  [phone]  Physical Therapy Treatment  Patient Details  Name: Joel Kim MRN: [id number] Date of Birth: 1945-10-01 Referring Provider (PT): Harvest Dark MD   Encounter Date: 05/05/2020   PT End of Session - 05/05/20 1039    Visit Number 18    Number of Visits 22    Date for PT Re-Evaluation 05/21/20    Authorization Type FOTO    PT Start Time 1030    PT Stop Time 1114    PT Time Calculation (min) 44 min    Activity Tolerance Patient tolerated treatment well    Behavior During Therapy [facility] for tasks assessed/performed           Past Medical History:  Diagnosis Date  . Asthma   . Atypical nevus 01/26/2006   right side of body (slight)  . Basal cell carcinoma 11/17/2015   left ear rim tx cx3 10fu  . Cancer ([place])   . Diabetes mellitus without complication ([place])   . Hypertension   . Hypothyroidism   . Insomnia   . Intractable migraine without aura   . Melanoma ([place]) 10/10/2014   right ant. shoulder (exc)  . Pancreatitis   . Spondylosis of lumbar spine   . Stroke ([place])   . Thyroid disease   . TIA (transient ischemic attack)     Past Surgical History:  Procedure Laterality Date  . BACK SURGERY    . CHOLECYSTECTOMY    . FOOT SURGERY    . JOINT REPLACEMENT    . rightt knee replacement      There were no vitals filed for this visit.   Subjective Assessment - 05/05/20 1035    Subjective COVID-19 screen performed prior to patient entering clinic.  Reports bush hogging yesterday but tried to not use LUE but still sore.    Pertinent History DM, TIA, back and foot surgery, thyroid problem.    Patient Stated Goals Use left UE without pain.    Currently in Pain? Yes    Pain Score 5     Pain Location Shoulder    Pain Orientation Left    Pain Descriptors / Indicators Sore    Pain Type Surgical pain    Pain Onset More than a month ago     Pain Frequency Constant              [facility] PT Assessment - 05/05/20 0001      Assessment   Medical Diagnosis Traumatic complete tear of left RTC    Referring Provider (PT) Harvest Dark MD    Onset Date/Surgical Date 02/27/20    Next MD Visit 05/20/2020                         [facility] Adult PT Treatment/Exercise - 05/05/20 0001      Shoulder Exercises: Supine   Protraction AAROM;Both;20 reps    Flexion AROM;Left;20 reps      Shoulder Exercises: Seated   Other Seated Exercises Assisted L upper cut x20 reps    Other Seated Exercises L AROM bicep curl x20 reps      Shoulder Exercises: Sidelying   External Rotation AROM;Left;20 reps    Flexion AROM;Left;5 reps   limited by discomfort     Shoulder Exercises: Pulleys   Flexion 5 minutes      Shoulder Exercises: ROM/Strengthening   Wall Wash flex x20 reps,  CW and CCW circles x20 reps      Shoulder Exercises: Isometric Strengthening   Flexion 5X5"    External Rotation 5X5"    Internal Rotation 5X5"      Modalities   Modalities Vasopneumatic      Vasopneumatic   Number Minutes Vasopneumatic  10 minutes    Vasopnuematic Location  Shoulder    Vasopneumatic Pressure Low    Vasopneumatic Temperature  34/pain      Manual Therapy   Manual Therapy Passive ROM    Passive ROM PROM of L shoulder into flexion, ER with holds at end range                       PT Long Term Goals - 04/07/20 1155      PT LONG TERM GOAL #1   Title Independent with a HEP.    Time 8    Period Weeks    Status On-going      PT LONG TERM GOAL #2   Title Perform ADL's with pain not > 3/10.    Time 8    Period Weeks    Status On-going      PT LONG TERM GOAL #3   Title Active left shoulder flexion to 145 degrees so the patient can easily reach overhead.    Time 8    Period Weeks    Status On-going      PT LONG TERM GOAL #4   Title Active ER to 70 degrees+ to allow for easily donning/doffing of apparel.     Time 8    Period Weeks    Status On-going      PT LONG TERM GOAL #5   Title Increase ROM so patient is able to reach behind back to L3.    Time 8    Period Weeks    Status On-going      PT LONG TERM GOAL #6   Title Increase shoulder strength to a solid 4 to 4+/5 to increase stability for performance of functional activities.    Time 8    Period Weeks    Status On-going                 Plan - 05/05/20 1126    Clinical Impression Statement Patient progressed to more AROM/AAROM exercises. Shoulder flexion requires mod assist via patient during antigravity flexion. Patient reports discomfort with supine flexion and SL flexion as well between 35 deg- 55deg of flexion. Patient able to complete full bicep curl with no issues. Firm end feels and smooth arc of motion noted during PROM in all directions. Normal vasopneumatic response noted following removal of the modality.    Personal Factors and Comorbidities Finances    Examination-Activity Limitations Other    Examination-Participation Restrictions Other    Stability/Clinical Decision Making Stable/Uncomplicated    Rehab Potential Excellent    PT Treatment/Interventions ADLs/Self Care Home Management;Cryotherapy;Electrical Stimulation;Therapeutic exercise;Manual techniques;Patient/family education;Vasopneumatic Device;Ultrasound;Passive range of motion    PT Next Visit Plan Continue AAROM progression.    PT Home Exercise Plan Table slides    Consulted and Agree with Plan of Care Patient           Patient will benefit from skilled therapeutic intervention in order to improve the following deficits and impairments:  Pain, Decreased activity tolerance, Increased edema, Decreased range of motion  Visit Diagnosis: Acute pain of left shoulder  Stiffness of left shoulder, not elsewhere classified  Localized edema  Problem List Patient Active Problem List   Diagnosis Date Noted  . Atypical chest pain 08/15/2018  .  Hyperlipidemia 08/15/2018  . Family history of heart disease 08/15/2018  . Hypertension 05/31/2018  . Hypothyroidism 05/31/2018  . Acute pancreatitis 05/31/2018  . Mild renal insufficiency 05/31/2018    Standley Brooking, PTA 05/05/2020, 11:41 AM  [facility] [address] [place], [place], [address] Phone: [phone]   Fax:  [phone]  Name: CHE RACHAL MRN: [id number] Date of Birth: [birth year]

## 2020-05-07 ENCOUNTER — Ambulatory Visit: Payer: Medicare HMO | Admitting: Physical Therapy

## 2020-05-07 ENCOUNTER — Encounter: Payer: Self-pay | Admitting: Physical Therapy

## 2020-05-07 ENCOUNTER — Other Ambulatory Visit: Payer: Self-pay

## 2020-05-07 DIAGNOSIS — M25612 Stiffness of left shoulder, not elsewhere classified: Secondary | ICD-10-CM

## 2020-05-07 DIAGNOSIS — M25512 Pain in left shoulder: Secondary | ICD-10-CM | POA: Diagnosis not present

## 2020-05-07 DIAGNOSIS — R600 Localized edema: Secondary | ICD-10-CM

## 2020-05-07 NOTE — Therapy (Signed)
[facility] [place], [place], [address] Phone: [phone]   Fax:  [phone]  Physical Therapy Treatment  Patient Details  Name: Joel Kim MRN: [id number] Date of Birth: 1945-10-13 Referring Provider (PT): Harvest Dark MD   Encounter Date: 05/07/2020   PT End of Session - 05/07/20 1039    Visit Number 19    Number of Visits 22    Date for PT Re-Evaluation 05/21/20    Authorization Type FOTO    PT Start Time 1030    PT Stop Time 1115    PT Time Calculation (min) 45 min    Activity Tolerance Patient tolerated treatment well    Behavior During Therapy [facility] for tasks assessed/performed           Past Medical History:  Diagnosis Date   Asthma    Atypical nevus 01/26/2006   right side of body (slight)   Basal cell carcinoma 11/17/2015   left ear rim tx cx3 60fu   Cancer ([place])    Diabetes mellitus without complication ([place])    Hypertension    Hypothyroidism    Insomnia    Intractable migraine without aura    Melanoma ([place]) 10/10/2014   right ant. shoulder (exc)   Pancreatitis    Spondylosis of lumbar spine    Stroke ([place])    Thyroid disease    TIA (transient ischemic attack)     Past Surgical History:  Procedure Laterality Date   BACK SURGERY     CHOLECYSTECTOMY     FOOT SURGERY     JOINT REPLACEMENT     rightt knee replacement      There were no vitals filed for this visit.   Subjective Assessment - 05/07/20 1038    Subjective COVID-19 screen performed prior to patient entering clinic. Reports soreness from bush hogging is gone but he hasn't bushhogged since.    Pertinent History DM, TIA, back and foot surgery, thyroid problem.    Patient Stated Goals Use left UE without pain.    Currently in Pain? No/denies              [facility] PT Assessment - 05/07/20 0001      Assessment   Medical Diagnosis Traumatic complete tear of left RTC    Referring Provider (PT) Harvest Dark MD    Onset Date/Surgical Date 02/27/20    Next MD Visit 05/20/2020                         [facility] Adult PT Treatment/Exercise - 05/07/20 0001      Shoulder Exercises: Supine   Flexion AAROM;Left;15 reps    Other Supine Exercises L upper cut x20 reps      Shoulder Exercises: Pulleys   Flexion 5 minutes      Shoulder Exercises: ROM/Strengthening   Wall Wash flex x20 reps, CW and CCW circles x20 reps    Ball on Wall CW circles x1 min, CCW circles x30 sec   reports anterior forearm discomfort     Shoulder Exercises: Isometric Strengthening   Flexion Other (comment)    Flexion Limitations 10x 5 sec    Extension Other (comment)    Extension Limitations 10x 5 sec    External Rotation Other (comment)    External Rotation Limitations 10 x 5 sec    Internal Rotation Other (comment)    Internal Rotation Limitations 10x 5 sec      Modalities  Modalities Vasopneumatic      Vasopneumatic   Number Minutes Vasopneumatic  10 minutes    Vasopnuematic Location  Shoulder    Vasopneumatic Pressure Low    Vasopneumatic Temperature  34/pain      Manual Therapy   Manual Therapy Passive ROM    Passive ROM PROM of L shoulder into flexion, ER with holds at end range                       PT Long Term Goals - 04/07/20 1155      PT LONG TERM GOAL #1   Title Independent with a HEP.    Time 8    Period Weeks    Status On-going      PT LONG TERM GOAL #2   Title Perform ADL's with pain not > 3/10.    Time 8    Period Weeks    Status On-going      PT LONG TERM GOAL #3   Title Active left shoulder flexion to 145 degrees so the patient can easily reach overhead.    Time 8    Period Weeks    Status On-going      PT LONG TERM GOAL #4   Title Active ER to 70 degrees+ to allow for easily donning/doffing of apparel.    Time 8    Period Weeks    Status On-going      PT LONG TERM GOAL #5   Title Increase ROM so patient is able to reach behind back to L3.     Time 8    Period Weeks    Status On-going      PT LONG TERM GOAL #6   Title Increase shoulder strength to a solid 4 to 4+/5 to increase stability for performance of functional activities.    Time 8    Period Weeks    Status On-going                 Plan - 05/07/20 1209    Clinical Impression Statement Patient presented in clinic with no reports of pain. Patient limited with the first approximately 55 deg flexion even in supine. Patient able to complete flexion better with L elbow flexed. Minimal shoulder elevation noted with bicep curl. Firm end feels and smooth arc of motion noted during PROM of L shoulder. Normal vasopneumatic response noted following removal of the modality.    Personal Factors and Comorbidities Finances    Examination-Activity Limitations Other    Examination-Participation Restrictions Other    Stability/Clinical Decision Making Stable/Uncomplicated    Rehab Potential Excellent    PT Treatment/Interventions ADLs/Self Care Home Management;Cryotherapy;Electrical Stimulation;Therapeutic exercise;Manual techniques;Patient/family education;Vasopneumatic Device;Ultrasound;Passive range of motion    PT Next Visit Plan Continue AAROM/AROM progression.    PT Home Exercise Plan Table slides    Consulted and Agree with Plan of Care Patient           Patient will benefit from skilled therapeutic intervention in order to improve the following deficits and impairments:  Pain, Decreased activity tolerance, Increased edema, Decreased range of motion  Visit Diagnosis: Acute pain of left shoulder  Stiffness of left shoulder, not elsewhere classified  Localized edema     Problem List Patient Active Problem List   Diagnosis Date Noted   Atypical chest pain 08/15/2018   Hyperlipidemia 08/15/2018   Family history of heart disease 08/15/2018   Hypertension 05/31/2018   Hypothyroidism 05/31/2018   Acute pancreatitis 05/31/2018  Mild renal insufficiency  05/31/2018    Standley Brooking, PTA 05/07/2020, 12:17 PM  [facility] [address] [place], [place], [address] Phone: [phone]   Fax:  [phone]  Name: Joel Kim MRN: [id number] Date of Birth: 19-Jul-1946

## 2020-05-08 ENCOUNTER — Telehealth: Payer: Self-pay | Admitting: Physician Assistant

## 2020-05-08 NOTE — Telephone Encounter (Signed)
Made patient aware waiting till November to treat the spot is ok. Also patient is on a waiting list for a cancellation list for [facility]

## 2020-05-08 NOTE — Telephone Encounter (Signed)
Patient's wife calling because she is unhappy with [facility]. They offered appointment in October but patient will be out of town at that time. She wants to speak to [facility].

## 2020-05-12 ENCOUNTER — Ambulatory Visit: Payer: Medicare HMO | Admitting: Physical Therapy

## 2020-05-12 ENCOUNTER — Encounter: Payer: Self-pay | Admitting: Physical Therapy

## 2020-05-12 ENCOUNTER — Other Ambulatory Visit: Payer: Self-pay

## 2020-05-12 DIAGNOSIS — M25512 Pain in left shoulder: Secondary | ICD-10-CM | POA: Diagnosis not present

## 2020-05-12 DIAGNOSIS — R600 Localized edema: Secondary | ICD-10-CM

## 2020-05-12 DIAGNOSIS — M25612 Stiffness of left shoulder, not elsewhere classified: Secondary | ICD-10-CM

## 2020-05-12 NOTE — Therapy (Signed)
[facility] [place], [place], [address] Phone: [phone]   Fax:  [phone]  Physical Therapy Treatment  Patient Details  Name: Joel Kim MRN: [id number] Date of Birth: [birth year] Referring Provider (PT): Harvest Dark MD   Encounter Date: 05/12/2020   PT End of Session - 05/12/20 1120    Visit Number 20    Number of Visits 22    Date for PT Re-Evaluation 05/21/20    Authorization Type FOTO    PT Start Time 1116    PT Stop Time 1204    PT Time Calculation (min) 48 min    Activity Tolerance Patient tolerated treatment well    Behavior During Therapy [facility] for tasks assessed/performed           Past Medical History:  Diagnosis Date  . Asthma   . Atypical nevus 01/26/2006   right side of body (slight)  . Basal cell carcinoma 11/17/2015   left ear rim tx cx3 40fu  . Cancer ([place])   . Diabetes mellitus without complication ([place])   . Hypertension   . Hypothyroidism   . Insomnia   . Intractable migraine without aura   . Melanoma ([place]) 10/10/2014   right ant. shoulder (exc)  . Pancreatitis   . Spondylosis of lumbar spine   . Stroke ([place])   . Thyroid disease   . TIA (transient ischemic attack)     Past Surgical History:  Procedure Laterality Date  . BACK SURGERY    . CHOLECYSTECTOMY    . FOOT SURGERY    . JOINT REPLACEMENT    . rightt knee replacement      There were no vitals filed for this visit.   Subjective Assessment - 05/12/20 1119    Subjective COVID-19 screen performed prior to patient entering clinic. Reports he doesn't have much issues today.    Pertinent History DM, TIA, back and foot surgery, thyroid problem.    Patient Stated Goals Use left UE without pain.    Currently in Pain? Yes    Pain Score 3     Pain Location Shoulder    Pain Orientation Left    Pain Descriptors / Indicators Sore    Pain Type Surgical pain    Pain Onset More than a month ago    Pain Frequency  Intermittent              [facility] PT Assessment - 05/12/20 0001      Assessment   Medical Diagnosis Traumatic complete tear of left RTC    Referring Provider (PT) Harvest Dark MD    Onset Date/Surgical Date 02/27/20    Next MD Visit 05/20/2020      Observation/Other Assessments   Focus on Therapeutic Outcomes (FOTO)  44%, CK                         [facility] Adult PT Treatment/Exercise - 05/12/20 0001      Elbow Exercises   Elbow Flexion Strengthening;Left;20 reps;10 reps;Seated;Bar weights/barbell   3#     Shoulder Exercises: Supine   Flexion AROM;Left;5 reps   sharp pain at L coracocoid process     Shoulder Exercises: Seated   Other Seated Exercises AROM L upper cut x15 reps      Shoulder Exercises: Standing   Flexion AAROM;Left;20 reps      Shoulder Exercises: Pulleys   Flexion 5 minutes      Shoulder Exercises: ROM/Strengthening  Wall Wash flex x20 reps      Shoulder Exercises: Isometric Strengthening   Flexion 5X10"    Extension 5X10"    External Rotation 5X10"    Internal Rotation 5X10"      Modalities   Modalities Ultrasound;Vasopneumatic      Ultrasound   Ultrasound Location L anterior shoulder    Ultrasound Parameters 1.5 w/cm2, 100%, 1 mhz x10 min    Ultrasound Goals Pain      Vasopneumatic   Number Minutes Vasopneumatic  10 minutes    Vasopnuematic Location  Shoulder    Vasopneumatic Pressure Low    Vasopneumatic Temperature  34/pain      Manual Therapy   Manual Therapy Myofascial release    Myofascial Release IASTW to proximal bicep, deltoids, lateral pectoralis to reduce tone and pain                       PT Long Term Goals - 04/07/20 1155      PT LONG TERM GOAL #1   Title Independent with a HEP.    Time 8    Period Weeks    Status On-going      PT LONG TERM GOAL #2   Title Perform ADL's with pain not > 3/10.    Time 8    Period Weeks    Status On-going      PT LONG TERM GOAL #3   Title Active left  shoulder flexion to 145 degrees so the patient can easily reach overhead.    Time 8    Period Weeks    Status On-going      PT LONG TERM GOAL #4   Title Active ER to 70 degrees+ to allow for easily donning/doffing of apparel.    Time 8    Period Weeks    Status On-going      PT LONG TERM GOAL #5   Title Increase ROM so patient is able to reach behind back to L3.    Time 8    Period Weeks    Status On-going      PT LONG TERM GOAL #6   Title Increase shoulder strength to a solid 4 to 4+/5 to increase stability for performance of functional activities.    Time 8    Period Weeks    Status On-going                 Plan - 05/12/20 1201    Clinical Impression Statement Patient presented in clinic with reports of more ROM prior to pain starting. Patient able to complete AAROM flexion in standing but still limited in supine AROM due to painful arc. Patient reported sharp pain with flexion around coracocoid process. Normal [place] response and good redness response noted with IASTW. Patient educated that soreness may present after IASTW session. Normal modalities response noted following removal of the modalities.    Personal Factors and Comorbidities Finances    Examination-Activity Limitations Other    Examination-Participation Restrictions Other    Stability/Clinical Decision Making Stable/Uncomplicated    Rehab Potential Excellent    PT Treatment/Interventions ADLs/Self Care Home Management;Cryotherapy;Electrical Stimulation;Therapeutic exercise;Manual techniques;Patient/family education;Vasopneumatic Device;Ultrasound;Passive range of motion    PT Next Visit Plan Continue AAROM/AROM progression.    PT Home Exercise Plan Table slides    Consulted and Agree with Plan of Care Patient           Patient will benefit from skilled therapeutic intervention in order to improve the  following deficits and impairments:  Pain, Decreased activity tolerance, Increased edema, Decreased range of  motion  Visit Diagnosis: Acute pain of left shoulder  Stiffness of left shoulder, not elsewhere classified  Localized edema     Problem List Patient Active Problem List   Diagnosis Date Noted  . Atypical chest pain 08/15/2018  . Hyperlipidemia 08/15/2018  . Family history of heart disease 08/15/2018  . Hypertension 05/31/2018  . Hypothyroidism 05/31/2018  . Acute pancreatitis 05/31/2018  . Mild renal insufficiency 05/31/2018    Standley Brooking, PTA 05/12/2020, 12:09 PM  [facility] [address] [place], [place], [address] Phone: [phone]   Fax:  [phone]  Name: TALLIS SOLEDAD MRN: [id number] Date of Birth: 08-24-1946

## 2020-05-12 NOTE — Telephone Encounter (Signed)
It ok to squeeze him in at the end of the day in the next few weeks.  Only need a 15 min appt. thanks

## 2020-05-14 ENCOUNTER — Encounter: Payer: Self-pay | Admitting: Physical Therapy

## 2020-05-14 ENCOUNTER — Other Ambulatory Visit: Payer: Self-pay

## 2020-05-14 ENCOUNTER — Ambulatory Visit: Payer: Medicare HMO | Admitting: Physical Therapy

## 2020-05-14 DIAGNOSIS — M25512 Pain in left shoulder: Secondary | ICD-10-CM

## 2020-05-14 DIAGNOSIS — M25612 Stiffness of left shoulder, not elsewhere classified: Secondary | ICD-10-CM

## 2020-05-14 DIAGNOSIS — R600 Localized edema: Secondary | ICD-10-CM

## 2020-05-14 NOTE — Therapy (Signed)
[facility] [place], [place], [address] Phone: [phone]   Fax:  [phone]  Physical Therapy Treatment  Patient Details  Name: Joel Kim MRN: [id number] Date of Birth: [birth year] Referring Provider (PT): Harvest Dark MD   Encounter Date: 05/14/2020   PT End of Session - 05/14/20 1313    Visit Number 21    Number of Visits 22    Date for PT Re-Evaluation 05/21/20    Authorization Type FOTO    PT Start Time 1309    PT Stop Time 1348    PT Time Calculation (min) 39 min    Activity Tolerance Patient tolerated treatment well    Behavior During Therapy [facility] for tasks assessed/performed           Past Medical History:  Diagnosis Date  . Asthma   . Atypical nevus 01/26/2006   right side of body (slight)  . Basal cell carcinoma 11/17/2015   left ear rim tx cx3 60fu  . Cancer ([place])   . Diabetes mellitus without complication ([place])   . Hypertension   . Hypothyroidism   . Insomnia   . Intractable migraine without aura   . Melanoma ([place]) 10/10/2014   right ant. shoulder (exc)  . Pancreatitis   . Spondylosis of lumbar spine   . Stroke ([place])   . Thyroid disease   . TIA (transient ischemic attack)     Past Surgical History:  Procedure Laterality Date  . BACK SURGERY    . CHOLECYSTECTOMY    . FOOT SURGERY    . JOINT REPLACEMENT    . rightt knee replacement      There were no vitals filed for this visit.   Subjective Assessment - 05/14/20 1311    Subjective COVID-19 screen performed prior to patient entering clinic. Reports just minimal discomfort today for unknown reason.    Pertinent History DM, TIA, back and foot surgery, thyroid problem.    Patient Stated Goals Use left UE without pain.    Currently in Pain? Yes    Pain Score 3     Pain Location Shoulder    Pain Orientation Left    Pain Descriptors / Indicators Discomfort    Pain Type Surgical pain    Pain Onset More than a month ago    Pain  Frequency Intermittent              [facility] PT Assessment - 05/14/20 0001      Assessment   Medical Diagnosis Traumatic complete tear of left RTC    Referring Provider (PT) Harvest Dark MD    Onset Date/Surgical Date 02/27/20    Next MD Visit 05/20/2020                         [facility] Adult PT Treatment/Exercise - 05/14/20 0001      Shoulder Exercises: Supine   Protraction AROM;Left;20 reps      Shoulder Exercises: Pulleys   Flexion 5 minutes      Shoulder Exercises: ROM/Strengthening   Wall Wash flex x20 reps    Rhythmic Stabilization, Supine in 45 deg, 90 deg     Other ROM/Strengthening Exercises 1/2 ABCs on wall    limited by fatigue      Shoulder Exercises: Isometric Strengthening   Flexion 5X10"    Extension 5X10"    External Rotation 5X10"    Internal Rotation 5X10"      Modalities   Modalities  Vasopneumatic      Vasopneumatic   Number Minutes Vasopneumatic  10 minutes    Vasopnuematic Location  Shoulder    Vasopneumatic Pressure Low    Vasopneumatic Temperature  34/pain      Manual Therapy   Manual Therapy Joint mobilization    Joint Mobilization Grade I-II L glenohumeral joint mobilizations P/I glides followed by PROM into flexion                       PT Long Term Goals - 05/14/20 1325      PT LONG TERM GOAL #1   Title Independent with a HEP.    Time 8    Period Weeks    Status On-going      PT LONG TERM GOAL #2   Title Perform ADL's with pain not > 3/10.    Time 8    Period Weeks    Status Achieved      PT LONG TERM GOAL #3   Title Active left shoulder flexion to 145 degrees so the patient can easily reach overhead.    Time 8    Period Weeks    Status On-going      PT LONG TERM GOAL #4   Title Active ER to 70 degrees+ to allow for easily donning/doffing of apparel.    Time 8    Period Weeks    Status Achieved      PT LONG TERM GOAL #5   Title Increase ROM so patient is able to reach behind back to L3.     Time 8    Period Weeks    Status On-going      PT LONG TERM GOAL #6   Title Increase shoulder strength to a solid 4 to 4+/5 to increase stability for performance of functional activities.    Time 8    Period Weeks    Status On-going                 Plan - 05/14/20 1341    Clinical Impression Statement Patient presented in clinic with continued limitation of AROM L shoulder flexion. Patient also progressed to more muscle endurance type acitivities in which he fatigued fairly quickly. Posterior and inferior glides of L glenohumeral joint completed followed by PROM of L shoulder into flexion. Good L shoulder stabilization initated today in 45 deg and 90 deg L shoulder flexion. Firm end feels and smooth arc of motion noted during PROM. Normal vasopneumatic response noted following removal of the modality.    Personal Factors and Comorbidities Finances    Examination-Activity Limitations Other    Examination-Participation Restrictions Other    Stability/Clinical Decision Making Stable/Uncomplicated    Rehab Potential Excellent    PT Treatment/Interventions ADLs/Self Care Home Management;Cryotherapy;Electrical Stimulation;Therapeutic exercise;Manual techniques;Patient/family education;Vasopneumatic Device;Ultrasound;Passive range of motion    PT Next Visit Plan Attempt long arm L shoulder flexion isometric.    PT Home Exercise Plan Table slides    Consulted and Agree with Plan of Care Patient           Patient will benefit from skilled therapeutic intervention in order to improve the following deficits and impairments:  Pain, Decreased activity tolerance, Increased edema, Decreased range of motion  Visit Diagnosis: Acute pain of left shoulder  Stiffness of left shoulder, not elsewhere classified  Localized edema     Problem List Patient Active Problem List   Diagnosis Date Noted  . Atypical chest pain 08/15/2018  . Hyperlipidemia 08/15/2018  .  Family history of heart  disease 08/15/2018  . Hypertension 05/31/2018  . Hypothyroidism 05/31/2018  . Acute pancreatitis 05/31/2018  . Mild renal insufficiency 05/31/2018    Standley Brooking, PTA 05/14/2020, 1:54 PM  [facility] [address] [place], [place], [address] Phone: [phone]   Fax:  [phone]  Name: Joel Kim MRN: [id number] Date of Birth: April 04, 1946

## 2020-05-19 ENCOUNTER — Ambulatory Visit: Payer: Medicare HMO | Admitting: Physical Therapy

## 2020-05-19 ENCOUNTER — Other Ambulatory Visit: Payer: Self-pay

## 2020-05-19 DIAGNOSIS — M25612 Stiffness of left shoulder, not elsewhere classified: Secondary | ICD-10-CM

## 2020-05-19 DIAGNOSIS — M25512 Pain in left shoulder: Secondary | ICD-10-CM

## 2020-05-19 DIAGNOSIS — R600 Localized edema: Secondary | ICD-10-CM

## 2020-05-19 NOTE — Therapy (Signed)
[facility] [place], [place], [address] Phone: [phone]   Fax:  [phone]  Physical Therapy Treatment   PHYSICAL THERAPY DISCHARGE SUMMARY  Visits from Start of Care: 22  Current functional level related to goals / functional outcomes: See below   Remaining deficits: See below   Education / Equipment: HEP Plan: Patient agrees to discharge.  Patient goals were partially met. Patient is being discharged due to the patient's request.  ?????    Patient will be leaving for a 2 month vacation next week. Gabriela Eves, PT, DPT  Gabriela Eves, PT, DPT  Patient Details  Name: Joel Kim MRN: [id number] Date of Birth: 1946-03-10 Referring Provider (PT): Harvest Dark MD   Encounter Date: 05/19/2020   PT End of Session - 05/19/20 1044    Visit Number 22    Number of Visits 22    Date for PT Re-Evaluation 05/21/20    Authorization Type FOTO    PT Start Time 1031    PT Stop Time 1118    PT Time Calculation (min) 47 min    Activity Tolerance Patient tolerated treatment well    Behavior During Therapy [facility] for tasks assessed/performed           Past Medical History:  Diagnosis Date  . Asthma   . Atypical nevus 01/26/2006   right side of body (slight)  . Basal cell carcinoma 11/17/2015   left ear rim tx cx3 100f  . Cancer [place]   . [place]   . Hypertension   . Hypothyroidism   . Insomnia   . Intractable migraine without aura   . [place]   right ant. shoulder (exc)  . Pancreatitis   . Spondylosis of lumbar spine   . [place]   . Thyroid disease   . TIA (transient ischemic attack)     Past Surgical History:  Procedure Laterality Date  . BACK SURGERY    . CHOLECYSTECTOMY    . FOOT SURGERY    . JOINT REPLACEMENT    . rightt knee replacement      There were no vitals filed for this visit.   Subjective Assessment - 05/19/20  1045    Subjective COVID-19 screen performed prior to patient entering clinic. Reports ongoing discomfort in anterior shoulder[place]    Pertinent History DM, TIA, back and foot surgery, thyroid problem.    Patient Stated Goals Use left UE without pain.    Currently in Pain? Yes    Pain Score 3     Pain Location Shoulder    Pain Orientation Left    Pain Descriptors / Indicators Discomfort    Pain Type Surgical pain    Pain Onset More than a month ago    Pain Frequency Intermittent              [facility] PT Assessment - 05/19/20 0001      Assessment   Medical Diagnosis Traumatic complete tear of left RTC    Referring Provider (PT) RHarvest DarkMD    Onset Date/Surgical Date 02/27/20    Next MD Visit 05/20/2020      Observation/Other Assessments   Focus on Therapeutic Outcomes (FOTO)  45% limitation      ROM / Strength   AROM / PROM / Strength AROM;PROM;Strength      AROM   AROM Assessment Site Shoulder    Right/Left Shoulder Left  Left Shoulder Flexion 82 Degrees   152 AAROM   Left Shoulder ABduction 92 Degrees   151 AAROM     PROM   Overall PROM  Within functional limits for tasks performed    Left Shoulder Flexion 170 Degrees    Left Shoulder ABduction 166 Degrees    Left Shoulder Internal Rotation 80 Degrees    Left Shoulder External Rotation 81 Degrees      Strength   Overall Strength Deficits;Due to pain    Strength Assessment Site Shoulder    Right/Left Shoulder Left    Left Shoulder Flexion 3-/5    Left Shoulder ABduction 3-/5    Left Shoulder Internal Rotation 3+/5    Left Shoulder External Rotation 3+/5                         [facility] Adult PT Treatment/Exercise - 05/19/20 0001      Shoulder Exercises: Seated   Flexion AROM;Left;10 reps   to 90 degrees     Shoulder Exercises: Standing   Protraction AROM;10 reps    External Rotation Strengthening;Left;20 reps    Theraband Level (Shoulder External  Rotation) Level 1 (Yellow)    Flexion AROM;Left;10 reps;AAROM   x5 with AAROM   Extension AROM;Left;20 reps;Theraband    Theraband Level (Shoulder Extension) Level 1 (Yellow)    Row Strengthening;Left;20 reps;Theraband    Theraband Level (Shoulder Row) Level 1 (Yellow)      Shoulder Exercises: Pulleys   Flexion 5 minutes      Shoulder Exercises: ROM/Strengthening   Wall Wash flex, circles CW, CCW x20 reps      Shoulder Exercises: Isometric Strengthening   Flexion 5X5"    Flexion Limitations long lever arm at about 45 degrees of flexion      Modalities   Modalities Vasopneumatic      Vasopneumatic   Number Minutes Vasopneumatic  10 minutes    Vasopnuematic Location  Shoulder    Vasopneumatic Pressure Low    Vasopneumatic Temperature  34/pain      Manual Therapy   Manual Therapy Joint mobilization    Joint Mobilization Grade I-II L glenohumeral joint mobilizations P/I glides followed by PROM into flexion    Passive ROM PROM for measurements                       PT Long Term Goals - 05/14/20 1325      PT LONG TERM GOAL #1   Title Independent with a HEP.    Time 8    Period Weeks    Status On-going      PT LONG TERM GOAL #2   Title Perform ADL's with pain not > 3/10.    Time 8    Period Weeks    Status Achieved      PT LONG TERM GOAL #3   Title Active left shoulder flexion to 145 degrees so the patient can easily reach overhead.    Time 8    Period Weeks    Status On-going      PT LONG TERM GOAL #4   Title Active ER to 70 degrees+ to allow for easily donning/doffing of apparel.    Time 8    Period Weeks    Status Achieved      PT LONG TERM GOAL #5   Title Increase ROM so patient is able to reach behind back to L3.    Time 8  Period Weeks    Status On-going      PT LONG TERM GOAL #6   Title Increase shoulder strength to a solid 4 to 4+/5 to increase stability for performance of functional activities.    Time 8    Period Weeks    Status  On-going                 Plan - 05/19/20 1226    Clinical Impression Statement Patient was able to complete treatment but with ongoing anterior shoulder pain. Patient will be away for 2 months therefore an advanced HEP consisting of resisted band exercises were provided. Patient guided through TEs and was provided with verbal and tactile cuing for form. Excellent form with resisted ER. Patient instructed on proper technique and how to progress TEs as he builds more strength. Goals partially met. Patient and PT discussed if he would like to return to PT he would have to see the MD and request for another referral. Normal response to modalities upon removal.    Personal Factors and Comorbidities Finances    Examination-Participation Restrictions Other    Stability/Clinical Decision Making Stable/Uncomplicated    Clinical Decision Making Low    Rehab Potential Excellent    PT Treatment/Interventions ADLs/Self Care Home Management;Cryotherapy;Electrical Stimulation;Therapeutic exercise;Manual techniques;Patient/family education;Vasopneumatic Device;Ultrasound;Passive range of motion    PT Next Visit Plan DC    PT Home Exercise Plan see patient education section    Consulted and Agree with Plan of Care Patient           Patient will benefit from skilled therapeutic intervention in order to improve the following deficits and impairments:  Pain, Decreased activity tolerance, Increased edema, Decreased range of motion  Visit Diagnosis: Acute pain of left shoulder  Stiffness of left shoulder, not elsewhere classified  Localized edema     Problem List Patient Active Problem List   Diagnosis Date Noted  . Atypical chest pain 08/15/2018  . Hyperlipidemia 08/15/2018  . Family history of heart disease 08/15/2018  . Hypertension 05/31/2018  . Hypothyroidism 05/31/2018  . Acute pancreatitis 05/31/2018  . Mild renal insufficiency 05/31/2018    Gabriela Eves, PT, DPT 05/19/2020,  12:45 PM  [facility] [address] [place], [place], [address] Phone: [phone]   Fax:  [phone]  Name: Joel Kim MRN: [id number] Date of Birth: [birth year]

## 2020-05-21 ENCOUNTER — Other Ambulatory Visit: Payer: Self-pay

## 2020-05-21 ENCOUNTER — Encounter: Payer: Medicare HMO | Admitting: Physical Therapy

## 2020-05-21 ENCOUNTER — Ambulatory Visit: Payer: Medicare HMO | Admitting: Dermatology

## 2020-05-21 DIAGNOSIS — C44219 Basal cell carcinoma of skin of left ear and external auricular canal: Secondary | ICD-10-CM

## 2020-05-21 DIAGNOSIS — C44329 Squamous cell carcinoma of skin of other parts of face: Secondary | ICD-10-CM

## 2020-05-21 DIAGNOSIS — C4492 Squamous cell carcinoma of skin, unspecified: Secondary | ICD-10-CM

## 2020-05-21 NOTE — Patient Instructions (Signed)

## 2020-05-25 ENCOUNTER — Encounter: Payer: Self-pay | Admitting: Dermatology

## 2020-05-25 NOTE — Progress Notes (Addendum)
° °  Follow-Up Visit   Subjective  Joel Kim is a 74 y.o. male who presents for the following: Procedure (treatment of scc x 1 left outer eye).  SCCA left outer orbit Location:  Duration:  Quality:  Associated Signs/Symptoms: Modifying Factors:  Severity:  Timing: Context: For treatment.  Also has new nodule left ear.  Objective  Well appearing patient in no apparent distress; mood and affect are within normal limits.  A focused examination of the head and neck performed.   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  Skin excision  Lesion length (cm):  1.2 Lesion width (cm):  1 Margin per side (cm):  0 Total excision diameter (cm):  1.2 Informed consent: discussed and consent obtained   Timeout: patient name, date of birth, surgical site, and procedure verified   Anesthesia: the lesion was anesthetized in a standard fashion   Anesthetic:  1% lidocaine w/ epinephrine 1-100,000 local infiltration Instrument used: #15 blade   Hemostasis achieved with: suture and pressure   Outcome: patient tolerated procedure well with no complications   Post-procedure details: wound care instructions given    Specimen 1 - Surgical pathology Differential Diagnosis: SCC Check Margins: superior margin stain [id number]  Curettage x3 showed lesion to have moderate depth; base cauterized and re-curetted followed by narrow margin excision.  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  Destruction of lesion Complexity: simple   Destruction method: electrodesiccation and curettage   Informed consent: discussed and consent obtained   Timeout:  patient name, date of birth, surgical site, and procedure verified Anesthesia: the lesion was anesthetized in a standard fashion   Anesthetic:  1% lidocaine w/ epinephrine 1-100,000 local infiltration Curettage performed in three different  directions: Yes   Curettage cycles:  3 Lesion length (cm):  1 Lesion width (cm):  1 Margin per side (cm):  0 Final wound size (cm):  1 Hemostasis achieved with:  ferric subsulfate Outcome: patient tolerated procedure well with no complications   Post-procedure details: wound care instructions given   Additional details:  Inoculated with parenteral 5% fluorouracil  Destruction of lesion      I, Lavonna Monarch, MD, have reviewed all documentation for this visit.  The documentation on 06/11/20 for the exam, diagnosis, procedures, and orders are all accurate and complete.

## 2020-05-28 ENCOUNTER — Ambulatory Visit (INDEPENDENT_AMBULATORY_CARE_PROVIDER_SITE_OTHER): Payer: Medicare HMO | Admitting: *Deleted

## 2020-05-28 ENCOUNTER — Other Ambulatory Visit: Payer: Self-pay

## 2020-05-28 DIAGNOSIS — Z4802 Encounter for removal of sutures: Secondary | ICD-10-CM

## 2020-05-28 DIAGNOSIS — C4492 Squamous cell carcinoma of skin, unspecified: Secondary | ICD-10-CM

## 2020-05-28 NOTE — Progress Notes (Signed)
Suture removed. Patient have been notified of pathology report.

## 2020-06-01 ENCOUNTER — Encounter: Payer: Self-pay | Admitting: Dermatology

## 2020-06-01 NOTE — Progress Notes (Signed)
   Follow-Up Visit   Subjective  Joel Kim is a 73 y.o. male who presents for the following: Follow-up (Pt stated---left ear--spot cameback, bleeding, sore and check skin.).  Regrowth left ear Location:  Duration:  Quality:  Associated Signs/Symptoms: Modifying Factors:  Severity:  Timing: Context: History of melanoma and nonmelanoma skin cancer  Objective  Well appearing patient in no apparent distress; mood and affect are within normal limits.  All skin waist up examined.   Assessment & Plan    Neoplasm of uncertain behavior of skin (2) Left Mid Helix  Skin / nail biopsy Type of biopsy: tangential   Informed consent: discussed and consent obtained   Timeout: patient name, date of birth, surgical site, and procedure verified   Procedure prep:  Patient was prepped and draped in usual sterile fashion Prep type:  Isopropyl alcohol Anesthesia: the lesion was anesthetized in a standard fashion   Anesthetic:  1% lidocaine w/ epinephrine 1-100,000 buffered w/ 8.4% NaHCO3 Instrument used: flexible razor blade   Hemostasis achieved with: pressure, aluminum chloride, ferric subsulfate and electrodesiccation   Outcome: patient tolerated procedure well   Post-procedure details: sterile dressing applied and wound care instructions given   Dressing type: bandage and petrolatum    Specimen 1 - Surgical pathology Differential Diagnosis: Recurrent BCC vs other Check Margins: No  Outside left eye  Skin / nail biopsy Type of biopsy: tangential   Informed consent: discussed and consent obtained   Timeout: patient name, date of birth, surgical site, and procedure verified   Procedure prep:  Patient was prepped and draped in usual sterile fashion Prep type:  Isopropyl alcohol Anesthesia: the lesion was anesthetized in a standard fashion   Anesthetic:  1% lidocaine w/ epinephrine 1-100,000 buffered w/ 8.4% NaHCO3 Instrument used: flexible razor blade   Hemostasis achieved  with: pressure, aluminum chloride, ferric subsulfate and electrodesiccation   Outcome: patient tolerated procedure well   Post-procedure details: sterile dressing applied and wound care instructions given   Dressing type: bandage and petrolatum    Specimen 2 - Surgical pathology Differential Diagnosis: BCC vs other Check Margins: No  Will plan MOHs to left mid helix if recurrent BCC.  History of melanoma Right Shoulder - Anterior  Clear today. No lymphadenopathy  Insect bite of left thigh with local reaction, initial encounter Left Thigh - Anterior  clobetasol ointment (TEMOVATE) 0.05 % - Left Thigh - Anterior  AK (actinic keratosis) Left Forearm - Anterior  Destruction of lesion - Left Forearm - Anterior Complexity: simple   Destruction method: cryotherapy   Informed consent: discussed and consent obtained   Timeout:  patient name, date of birth, surgical site, and procedure verified Lesion destroyed using liquid nitrogen: Yes   Region frozen until ice ball extended beyond lesion: Yes   Outcome: patient tolerated procedure well with no complications   Post-procedure details: wound care instructions given    Nevus Mid Back  Annual skin examination  Insect bite of left periocular area, initial encounter Right Lower Leg - Anterior  Insect bite of left upper arm, initial encounter Left Lower Leg - Anterior  Rx clobetasol which she can use on a as needed basis except on the face and body folds words to be avoided.     I, Lavonna Monarch, MD, have reviewed all documentation for this visit.  The documentation on 06/01/20 for the exam, diagnosis, procedures, and orders are all accurate and complete.

## 2020-06-11 NOTE — Addendum Note (Signed)
Addended by: Lavonna Monarch on: 06/11/2020 08:11 AM   Modules accepted: Orders

## 2020-06-16 ENCOUNTER — Ambulatory Visit: Payer: Medicare HMO | Admitting: Physician Assistant

## 2020-10-08 ENCOUNTER — Ambulatory Visit: Payer: Medicare HMO | Admitting: Physician Assistant

## 2020-10-08 ENCOUNTER — Other Ambulatory Visit: Payer: Self-pay

## 2020-10-08 ENCOUNTER — Encounter: Payer: Self-pay | Admitting: Physician Assistant

## 2020-10-08 DIAGNOSIS — Z85828 Personal history of other malignant neoplasm of skin: Secondary | ICD-10-CM

## 2020-10-08 DIAGNOSIS — Z1283 Encounter for screening for malignant neoplasm of skin: Secondary | ICD-10-CM | POA: Diagnosis not present

## 2020-10-08 DIAGNOSIS — L570 Actinic keratosis: Secondary | ICD-10-CM | POA: Diagnosis not present

## 2020-10-09 ENCOUNTER — Encounter: Payer: Self-pay | Admitting: Physician Assistant

## 2020-10-09 NOTE — Progress Notes (Signed)
   Follow-Up Visit   Subjective  Joel Kim is a 75 y.o. male who presents for the following: Follow-up (Recheck left temple- scc- healing good no concerns).   The following portions of the chart were reviewed this encounter and updated as appropriate:  Tobacco  Allergies  Meds  Problems  Med Hx  Surg Hx  Fam Hx      Objective  Well appearing patient in no apparent distress; mood and affect are within normal limits.  A focused examination was performed including face and ears. Relevant physical exam findings are noted in the Assessment and Plan.  Objective  Left Temple: White scar- clear  Objective  Right Anterior Shoulder: Face & neck skin check  Objective  Mid Forehead (8): Erythematous patches with gritty scale.   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  The documentation on 10/09/20 for the exam, diagnosis, procedures and orders are all accurate and complete.

## 2020-10-23 ENCOUNTER — Ambulatory Visit: Payer: Medicare HMO | Admitting: Physician Assistant

## 2021-01-29 ENCOUNTER — Encounter (HOSPITAL_COMMUNITY): Payer: Self-pay | Admitting: *Deleted

## 2021-01-29 ENCOUNTER — Emergency Department (HOSPITAL_COMMUNITY): Payer: Medicare HMO

## 2021-01-29 ENCOUNTER — Emergency Department (HOSPITAL_COMMUNITY)
Admission: EM | Admit: 2021-01-29 | Discharge: 2021-01-29 | Disposition: A | Discharge status: Home / Self Care | Payer: Medicare HMO | Attending: Emergency Medicine | Admitting: Emergency Medicine

## 2021-01-29 ENCOUNTER — Other Ambulatory Visit: Payer: Self-pay

## 2021-01-29 DIAGNOSIS — Z85828 Personal history of other malignant neoplasm of skin: Secondary | ICD-10-CM | POA: Diagnosis not present

## 2021-01-29 DIAGNOSIS — I10 Essential (primary) hypertension: Secondary | ICD-10-CM | POA: Diagnosis not present

## 2021-01-29 DIAGNOSIS — U071 COVID-19: Secondary | ICD-10-CM | POA: Insufficient documentation

## 2021-01-29 DIAGNOSIS — E039 Hypothyroidism, unspecified: Secondary | ICD-10-CM | POA: Diagnosis not present

## 2021-01-29 DIAGNOSIS — E119 Type 2 diabetes mellitus without complications: Secondary | ICD-10-CM | POA: Insufficient documentation

## 2021-01-29 DIAGNOSIS — Z79899 Other long term (current) drug therapy: Secondary | ICD-10-CM | POA: Diagnosis not present

## 2021-01-29 DIAGNOSIS — Z96651 Presence of right artificial knee joint: Secondary | ICD-10-CM | POA: Diagnosis not present

## 2021-01-29 DIAGNOSIS — J45909 Unspecified asthma, uncomplicated: Secondary | ICD-10-CM | POA: Diagnosis not present

## 2021-01-29 DIAGNOSIS — R059 Cough, unspecified: Secondary | ICD-10-CM | POA: Diagnosis present

## 2021-01-29 LAB — CBC WITH DIFFERENTIAL/PLATELET
Abs Immature Granulocytes: 0.02 10*3/uL (ref 0.00–0.07)
Basophils Absolute: 0.1 10*3/uL (ref 0.0–0.1)
Basophils Relative: 1 %
Eosinophils Absolute: 0.2 10*3/uL (ref 0.0–0.5)
Eosinophils Relative: 2 %
HCT: 48 % (ref 39.0–52.0)
Hemoglobin: 16.6 g/dL (ref 13.0–17.0)
Immature Granulocytes: 0 %
Lymphocytes Relative: 14 %
Lymphs Abs: 1.1 10*3/uL (ref 0.7–4.0)
MCH: 31.9 pg (ref 26.0–34.0)
MCHC: 34.6 g/dL (ref 30.0–36.0)
MCV: 92.3 fL (ref 80.0–100.0)
Monocytes Absolute: 0.7 10*3/uL (ref 0.1–1.0)
Monocytes Relative: 8 %
Neutro Abs: 5.9 10*3/uL (ref 1.7–7.7)
Neutrophils Relative %: 75 %
Platelets: 209 10*3/uL (ref 150–400)
RBC: 5.2 MIL/uL (ref 4.22–5.81)
RDW: 12.9 % (ref 11.5–15.5)
WBC: 7.9 10*3/uL (ref 4.0–10.5)
nRBC: 0 % (ref 0.0–0.2)

## 2021-01-29 LAB — RESP PANEL BY RT-PCR (FLU A&B, COVID) ARPGX2
Influenza A by PCR: NEGATIVE
Influenza B by PCR: NEGATIVE
SARS Coronavirus 2 by RT PCR: POSITIVE — AB

## 2021-01-29 LAB — BASIC METABOLIC PANEL
Anion gap: 9 (ref 5–15)
BUN: 20 mg/dL (ref 8–23)
CO2: 30 mmol/L (ref 22–32)
Calcium: 9.9 mg/dL (ref 8.9–10.3)
Chloride: 96 mmol/L — ABNORMAL LOW (ref 98–111)
Creatinine, Ser: 1.27 mg/dL — ABNORMAL HIGH (ref 0.61–1.24)
GFR, Estimated: 59 mL/min — ABNORMAL LOW (ref >60)
Glucose, Bld: 117 mg/dL — ABNORMAL HIGH (ref 70–99)
Potassium: 3.4 mmol/L — ABNORMAL LOW (ref 3.5–5.1)
Sodium: 135 mmol/L (ref 135–145)

## 2021-01-29 MED ORDER — MOLNUPIRAVIR EUA 200MG CAPSULE: 4.0000 | ORAL_CAPSULE | Freq: Two times a day (BID) | ORAL | 0 refills | Status: AC

## 2021-01-29 MED ORDER — ACETAMINOPHEN 325 MG PO TABS
650.0000 mg | ORAL_TABLET | Freq: Once | ORAL | Status: AC
  Administered 2021-01-29: 650 mg via ORAL
  Filled 2021-01-29: qty 2

## 2021-01-29 NOTE — ED Notes (Signed)
93% O2 sats ON RA while ambulating

## 2021-01-29 NOTE — ED Provider Notes (Signed)
[facility] EMERGENCY DEPARTMENT Provider Note   CSN: [id number] Arrival date & time: 01/29/21  1400     History Chief Complaint  Patient presents with  . Cough    Joel Kim is a 75 y.o. male with PMHx HTN, Diabetes, who presents to the ED today with complaint of a dry cough for the past 3-4 days. Pt also complains of shortness of breath and fever. He has not been taking anything at home for his symptoms. He reports he has a "bug" currently. His wife is sick at home too. He is unsure if she has tested positive for COVID or flu. He is vaccinated x 2. Denies chest pain, headache, sore throat, ear pain, body aches, abdominal pain, nausea, vomiting, or any other associated symptoms.   The history is provided by the patient and medical records.       Past Medical History:  Diagnosis Date  . Asthma   . Atypical nevus 01/26/2006   right side of body (slight)  . Basal cell carcinoma 11/17/2015   left ear rim tx [id number] [id number]  . Cancer ([place])   . Diabetes mellitus without complication ([place])   . Hypertension   . Hypothyroidism   . Insomnia   . Intractable migraine without aura   . Melanoma ([place]) 10/10/2014   right ant. shoulder (exc)  . Pancreatitis   . Spondylosis of lumbar spine   . Stroke ([place])   . Thyroid disease   . TIA (transient ischemic attack)     Patient Active Problem List   Diagnosis Date Noted  . Atypical chest pain 08/15/2018  . Hyperlipidemia 08/15/2018  . Family history of heart disease 08/15/2018  . Hypertension 05/31/2018  . Hypothyroidism 05/31/2018  . Acute pancreatitis 05/31/2018  . Mild renal insufficiency 05/31/2018    Past Surgical History:  Procedure Laterality Date  . BACK SURGERY    . CHOLECYSTECTOMY    . FOOT SURGERY    . JOINT REPLACEMENT    . rightt knee replacement         History reviewed. No pertinent family history.  Social History   Tobacco Use  . Smoking status: Never Smoker  . Smokeless tobacco: Never Used  Substance Use  Topics  . Alcohol use: No  . Drug use: No    Home Medications Prior to Admission medications   Medication Sig Start Date End Date Taking? Authorizing Provider  molnupiravir EUA 200 mg CAPS Take 4 capsules (800 mg total) by mouth 2 (two) times daily for 5 days. 01/29/21 02/03/21 Yes Torie Towle, PA-C  albuterol (PROVENTIL HFA;VENTOLIN HFA) 108 (90 Base) MCG/ACT inhaler Inhale 2 puffs into the lungs every 6 (six) hours as needed. 05/13/18   Rancour, Annie Main, MD  allopurinol (ZYLOPRIM) 100 MG tablet Take 100 mg by mouth daily.    [provider]  amLODipine (NORVASC) 5 MG tablet Take 5 mg by mouth daily.    [provider]  cetirizine (ZYRTEC) 10 MG tablet Take 10 mg by mouth at bedtime.     [provider]  Cholecalciferol (VITAMIN D3) 1000 UNITS CAPS Take 1,000 Units by mouth daily.     [provider]  clobetasol ointment (TEMOVATE) [id number] % Apply 1 application topically 2 (two) times daily. 04/22/20   Lavonna Monarch, MD  hydrochlorothiazide (HYDRODIURIL) 25 MG tablet Take 25 mg by mouth daily.    [provider]  levothyroxine (SYNTHROID) 100 MCG tablet  12/06/18   [provider]  metoprolol succinate (TOPROL-XL) 50  MG 24 hr tablet Take 50 mg by mouth at bedtime. Take with or immediately following a meal. 04/02/13   Chipper Herb, MD  nortriptyline (PAMELOR) 25 MG capsule Take 25 mg by mouth at bedtime.    [provider]  Omega-3 Fatty Acids (FISH OIL ULTRA) 1000 MG CAPS Take 1,000 mg by mouth at bedtime.     [provider]  Oxycodone HCl 10 MG TABS Take by mouth.    [provider]  pantoprazole (PROTONIX) 40 MG tablet Take 1 tablet (40 mg total) by mouth daily. 01/21/13   Chipper Herb, MD  predniSONE (DELTASONE) 10 MG tablet Take 10 mg by mouth daily with breakfast.    [provider]  SUMAtriptan (IMITREX) 50 MG tablet Take 1 tablet (50 mg total) by mouth as needed. Every 2 hrs as needed 06/17/19    Garvin Fila, MD  testosterone cypionate (DEPOTESTOSTERONE CYPIONATE) 200 MG/ML injection Inject into the muscle. 05/06/19   [provider]  traZODone (DESYREL) 50 MG tablet Take by mouth. 12/26/19   [provider]  valACYclovir (VALTREX) 1000 MG tablet TAKE TWO TABLETS BY MOUTH TWICE DAILY 09/17/18   [provider]  VITAMIN D PO Take 1,000 Units by mouth.    [provider]    Allergies    Tapentadol, Paroxetine hcl, Statins, Adhesive [tape], Ciprofloxacin, Flagyl [metronidazole], and Reclast [zoledronic acid]  Review of Systems   Review of Systems  Constitutional: Positive for fatigue and fever. Negative for chills.  Respiratory: Positive for cough and shortness of breath.   Cardiovascular: Negative for chest pain.  Gastrointestinal: Negative for nausea and vomiting.  All other systems reviewed and are negative.   Physical Exam Updated Vital Signs BP 137/82 (BP Location: Right Arm)   Pulse 93   Temp (!) 102 F (38.9 C) (Oral)   Resp 20   Ht 5\' 5"  (1.651 m)   Wt 74.8 kg   SpO2 91%   BMI 27.46 kg/m   Physical Exam Vitals and nursing note reviewed.  Constitutional:      Appearance: He is not ill-appearing.  HENT:     Head: Normocephalic and atraumatic.  Eyes:     Conjunctiva/sclera: Conjunctivae normal.  Cardiovascular:     Rate and Rhythm: Normal rate and regular rhythm.     Pulses: Normal pulses.  Pulmonary:     Effort: Pulmonary effort is normal.     Breath sounds: Normal breath sounds. No wheezing, rhonchi or rales.     Comments: Speaking in full sentences without difficulty. LCTAB.  Abdominal:     Palpations: Abdomen is soft.     Tenderness: There is no abdominal tenderness. There is no guarding or rebound.     Hernia: No hernia is present.  Musculoskeletal:     Cervical back: Neck supple.  Skin:    General: Skin is warm and dry.  Neurological:     Mental Status: He is alert.     ED Results / Procedures /  Treatments   Labs (all labs ordered are listed, but only abnormal results are displayed) Labs Reviewed  RESP PANEL BY RT-PCR (FLU A&B, COVID) ARPGX2 - Abnormal; Notable for the following components:      Result Value   SARS Coronavirus 2 by RT PCR POSITIVE (*)    All other components within normal limits  BASIC METABOLIC PANEL - Abnormal; Notable for the following components:   Potassium 3.4 (*)    Chloride 96 (*)  Glucose, Bld 117 (*)    Creatinine, Ser 1.27 (*)    GFR, Estimated 59 (*)    All other components within normal limits  CBC WITH DIFFERENTIAL/PLATELET    EKG EKG Interpretation  Date/Time:  Friday Jan 29 2021 16:07:27 EDT Ventricular Rate:  88 PR Interval:  186 QRS Duration: 98 QT Interval:  374 QTC Calculation: 452 R Axis:   107 Text Interpretation: Normal sinus rhythm Incomplete right bundle branch block Possible Right ventricular hypertrophy Abnormal ECG Confirmed by Noemi Chapel [phone]) on 01/29/2021 4:26:49 PM   Radiology DG Chest 2 View  Result Date: 01/29/2021 CLINICAL DATA:  Fever, chills, weakness and non productive cough for several days. EXAM: CHEST - 2 VIEW COMPARISON:  08/10/2018. FINDINGS: Cardiac silhouette normal in size. No mediastinal or hilar masses or evidence of adenopathy. Clear lungs.  No pleural effusion or pneumothorax. Skeletal structures are intact. Spine stimulator leads project along the posterior midthoracic spinal canal. IMPRESSION: No active cardiopulmonary disease. Electronically Signed   By: Lajean Manes M.D.   On: 01/29/2021 15:04    Procedures Procedures   Medications Ordered in ED Medications  acetaminophen (TYLENOL) tablet 650 mg (650 mg Oral Given 01/29/21 1600)    ED Course  I have reviewed the triage vital signs and the nursing notes.  Pertinent labs & imaging results that were available during my care of the patient were reviewed by me and considered in my medical decision making (see chart for details).  Clinical  Course as of 01/29/21 1630  Fri Jan 29, 2021  1549 SARS Coronavirus 2 by RT PCR(!): POSITIVE [MV]    Clinical Course User Index [MV] Eustaquio Maize, PA-C   MDM Rules/Calculators/A&P                          75 year old male presents to the ED today with complaint of cough, shortness of breath, fever for the past 3 to 4 days.  Vaccinated x2.  Sick at home.  On arrival to the ED patient is febrile at 102.0.,  Nontachycardic, nontachypneic.  Satting 91% on room air.  Work-up was started in triage with labs, pending COVID testing, chest x-ray.  On my exam patient appears to be in no acute distress.  Speaking in full sentences without difficulty.  Lungs clear to auscultation bilaterally.  Obtain EKG given complaint of shortness of breath and will plan to ambulate with pulse ox to see if patient desats given his O2 sat sitting is at 91%.  X-ray clear at this time.  Lab work unremarkable.  Baseline CKD at 1.27.  COVID test has returned positive at this time.   Pt has ambulated with O2 sats staying at 93% on RA. Will discharge home at this time. Given his age and comorbidities will prescribe molnupiravir as pt currently qualifies given his symptoms are < 93 days old. He is instructed to self isolate over the weekend given he is on day 4 currently. He will follow up with his PCP next week. Strict return precautions discussed with pt. He is in agreement with plan and stable for discharge home.   This note was prepared using Dragon voice recognition software and may include unintentional dictation errors due to the inherent limitations of voice recognition software.  Joel Kim was evaluated in Emergency Department on 01/29/2021 for the symptoms described in the history of present illness. He was evaluated in the context of the global COVID-19 pandemic, which necessitated consideration that  the patient might be at risk for infection with the SARS-CoV-2 virus that causes COVID-19. Institutional protocols  and algorithms that pertain to the evaluation of patients at risk for COVID-19 are in a state of rapid change based on information released by regulatory bodies including the CDC and federal and state organizations. These policies and algorithms were followed during the patient's care in the ED.  Final Clinical Impression(s) / ED Diagnoses Final diagnoses:  [id number]    Rx / DC Orders ED Discharge Orders         Ordered    molnupiravir EUA 200 mg CAPS  2 times daily        01/29/21 1627           Discharge Instructions     You have tested POSITIVE for COVID 19 today. Please self isolate for the next 3 days (cleared Monday: 10/9). Pick up the prescription medication and start taking as prescribed to help prevent serious illness from [place].   Follow up with your PCP next week for further evaluation of your symptoms.   Return to the ED IMMEDIATELY for any worsening symptoms including severe chest pain, worsening shortness of breath, lips/fingers turning blue, new onset confusion, inability to awaken easily, or any new/concerning symptoms.        Eustaquio Maize, PA-C 01/29/21 1630    Noemi Chapel, MD 01/30/21 [phone]

## 2021-01-29 NOTE — Discharge Instructions (Signed)
You have tested POSITIVE for COVID 19 today. Please self isolate for the next 3 days (cleared Monday: 10/9). Pick up the prescription medication and start taking as prescribed to help prevent serious illness from [place].   Follow up with your PCP next week for further evaluation of your symptoms.   Return to the ED IMMEDIATELY for any worsening symptoms including severe chest pain, worsening shortness of breath, lips/fingers turning blue, new onset confusion, inability to awaken easily, or any new/concerning symptoms.

## 2021-01-29 NOTE — ED Triage Notes (Signed)
Pt states he feels bad.  Poor historian. States he has had a cough x one week, non-productive

## 2021-01-29 NOTE — ED Provider Notes (Signed)
Emergency Medicine Provider Triage Evaluation Note  ASON HESLIN , a 75 y.o. male  was evaluated in triage.  Pt complains of of breath and cough.  Review of Systems  Positive: Fever, chills, general weakness Negative: He has had 2 COVID vaccines, he denies vomiting or dizziness  Physical Exam  BP 137/82 (BP Location: Right Arm)   Pulse 93   Temp (!) 102 F (38.9 C) (Oral)   Resp 20   Ht 5\' 5"  (1.651 m)   Wt 74.8 kg   SpO2 91%   BMI 27.46 kg/m  Gen:   Awake, no distress, alert and able to sit in wheelchair Resp:  Normal effort, no stridor or increased work of breathing MSK:   Moves extremities without difficulty no local deformity Other:  Speaks slowly  Medical Decision Making  Medically screening exam initiated at 2:27 PM.  Appropriate orders placed.  AVYUKTH BONTEMPO was informed that the remainder of the evaluation will be completed by another provider, this initial triage assessment does not replace that evaluation, and the importance of remaining in the ED until their evaluation is complete.  Initial testing ordered.   Daleen Bo, MD 01/29/21 [phone]

## 2021-01-29 NOTE — ED Provider Notes (Signed)
Medical screening examination/treatment/procedure(s) were conducted as a shared visit with non-physician practitioner(s) and myself.  I personally evaluated the patient during the encounter.  Clinical Impression:   Final diagnoses:  [id number]    This patient is a 75 year old male, has a cough, little bit short of breath, his wife has been sick for the last week or 2, his symptoms have been going on for 72 hours.  On exam he has clear lungs, no edema, no tachycardia.  Temperature was 102 degrees, oxygen is 93% on room air, there is no pneumonia on his x-ray.  At this time the patient is stable for discharge, antiviral will be prescribed, I had a discussion with the patient about the indications for return and he is agreeable.   Noemi Chapel, MD 01/30/21 [phone]

## 2021-03-10 ENCOUNTER — Ambulatory Visit: Payer: Medicare HMO | Admitting: Physician Assistant

## 2021-06-16 ENCOUNTER — Ambulatory Visit: Payer: Medicare HMO | Admitting: Physician Assistant

## 2021-06-16 ENCOUNTER — Other Ambulatory Visit: Payer: Self-pay

## 2021-06-16 ENCOUNTER — Encounter: Payer: Self-pay | Admitting: Physician Assistant

## 2021-06-16 DIAGNOSIS — Z1283 Encounter for screening for malignant neoplasm of skin: Secondary | ICD-10-CM | POA: Diagnosis not present

## 2021-06-16 DIAGNOSIS — Z85820 Personal history of malignant melanoma of skin: Secondary | ICD-10-CM

## 2021-06-16 DIAGNOSIS — D485 Neoplasm of uncertain behavior of skin: Secondary | ICD-10-CM | POA: Diagnosis not present

## 2021-06-16 DIAGNOSIS — L570 Actinic keratosis: Secondary | ICD-10-CM | POA: Diagnosis not present

## 2021-06-16 DIAGNOSIS — Z85828 Personal history of other malignant neoplasm of skin: Secondary | ICD-10-CM

## 2021-06-16 DIAGNOSIS — Z86018 Personal history of other benign neoplasm: Secondary | ICD-10-CM

## 2021-06-16 MED ORDER — TOLAK 4 % EX CREA: TOPICAL_CREAM | CUTANEOUS | 1 refills | Status: DC

## 2021-06-16 NOTE — Patient Instructions (Signed)

## 2021-06-16 NOTE — Progress Notes (Signed)
   Follow-Up Visit   Subjective  Joel Kim is a 75 y.o. male who presents for the following: Annual Exam (Personal history of mm 2016 bcc and atypia ).   The following portions of the chart were reviewed this encounter and updated as appropriate:  Tobacco  Allergies  Meds  Problems  Med Hx  Surg Hx  Fam Hx      Objective  Well appearing patient in no apparent distress; mood and affect are within normal limits.  A full examination was performed including scalp, head, eyes, ears, nose, lips, neck, chest, axillae, abdomen, back, buttocks, bilateral upper extremities, bilateral lower extremities, hands, feet, fingers, toes, fingernails, and toenails. All findings within normal limits unless otherwise noted below.  Left Lower Cutaneous Lip Small nodule with surrounding erythematous       Mid Frontal Scalp, Right Forehead Erythematous patches with gritty scale.   Assessment & Plan  Neoplasm of uncertain behavior of skin Left Lower Cutaneous Lip  Skin / nail biopsy Type of biopsy: tangential   Informed consent: discussed and consent obtained   Timeout: patient name, date of birth, surgical site, and procedure verified   Procedure prep:  Patient was prepped and draped in usual sterile fashion (Non sterile) Prep type:  Chlorhexidine Anesthesia: the lesion was anesthetized in a standard fashion   Anesthetic:  1% lidocaine w/ epinephrine 1-100,000 local infiltration Instrument used: flexible razor blade   Outcome: patient tolerated procedure well   Post-procedure details: wound care instructions given    Specimen 1 - Surgical pathology Differential Diagnosis: bcc vs scc- 3 pieces   Check Margins: No  AK (actinic keratosis) (2) Mid Frontal Scalp; Right Forehead  Fluorouracil (TOLAK) 4 % CREA - Mid Frontal Scalp, Right Forehead Apply to affected area Monday-Sunday qhs x 2 weeks    I, Shantelle Alles, PA-C, have reviewed all documentation's for this visit.  The  documentation on 06/16/21 for the exam, diagnosis, procedures and orders are all accurate and complete.

## 2021-06-23 ENCOUNTER — Telehealth: Payer: Self-pay | Admitting: Physician Assistant

## 2021-06-23 NOTE — Telephone Encounter (Signed)
Patient's wife is calling for pathology results from last visit with [facility], PA-C.

## 2021-06-24 NOTE — Telephone Encounter (Signed)
Path to patient 10/18 surgery

## 2021-06-24 NOTE — Telephone Encounter (Signed)
-----   Message from Warren Danes, [place] sent at 06/23/2021  4:07 PM EDT ----- 30

## 2021-07-13 ENCOUNTER — Encounter: Payer: Medicare HMO | Admitting: Physician Assistant

## 2021-07-20 ENCOUNTER — Ambulatory Visit (INDEPENDENT_AMBULATORY_CARE_PROVIDER_SITE_OTHER): Payer: Medicare HMO | Admitting: Physician Assistant

## 2021-07-20 ENCOUNTER — Other Ambulatory Visit: Payer: Self-pay

## 2021-07-20 ENCOUNTER — Encounter: Payer: Self-pay | Admitting: Physician Assistant

## 2021-07-20 DIAGNOSIS — D099 Carcinoma in situ, unspecified: Secondary | ICD-10-CM

## 2021-07-20 DIAGNOSIS — D040 Carcinoma in situ of skin of lip: Secondary | ICD-10-CM | POA: Diagnosis not present

## 2021-07-20 NOTE — Patient Instructions (Signed)

## 2021-07-21 ENCOUNTER — Encounter: Payer: Self-pay | Admitting: Physician Assistant

## 2021-07-21 NOTE — Progress Notes (Signed)
   Follow-Up Visit   Subjective  Joel Kim is a 75 y.o. male who presents for the following: Procedure (Patient here today for treatment of SCC on left lower cutaneous lip).   The following portions of the chart were reviewed this encounter and updated as appropriate:  Tobacco  Allergies  Meds  Problems  Med Hx  Surg Hx  Fam Hx      Objective  Well appearing patient in no apparent distress; mood and affect are within normal limits.  A focused examination was performed including face. Relevant physical exam findings are noted in the Assessment and Plan.  Mid Lower [place] Lip Scaly pink papule or plaque.   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  Destruction of lesion Complexity: simple   Destruction method: electrodesiccation and curettage   Informed consent: discussed and consent obtained   Timeout:  patient name, date of birth, surgical site, and procedure verified Anesthesia: the lesion was anesthetized in a standard fashion   Anesthetic:  1% lidocaine w/ epinephrine 1-100,000 local infiltration Curettage performed in three different directions: Yes   Electrodesiccation performed over the curetted area: Yes   Curettage cycles:  3 Final wound size (cm):  1 Hemostasis achieved with:  ferric subsulfate Outcome: patient tolerated procedure well with no complications   Additional details:  Wound innoculated with 5 fluorouracil solution.   I, Ivet Guerrieri, PA-C, have reviewed all documentation's for this visit.  The documentation on 07/21/21 for the exam, diagnosis, procedures and orders are all accurate and complete.

## 2021-10-19 ENCOUNTER — Ambulatory Visit: Payer: Medicare HMO | Admitting: Physician Assistant

## 2021-10-19 ENCOUNTER — Telehealth: Payer: Self-pay | Admitting: Physician Assistant

## 2021-10-19 MED ORDER — TRIAMCINOLONE ACETONIDE 0.1 % EX CREA: 1.0000 "application " | TOPICAL_CREAM | Freq: Every day | CUTANEOUS | 0 refills | Status: DC

## 2021-10-19 NOTE — Telephone Encounter (Signed)
Has been using [place], and face looks horrible, red,and wants to know if there's something he can put on it for the irritation and pain. Has finished up last treatment last night. Pharmacy:  [facility], [place].

## 2021-10-19 NOTE — Telephone Encounter (Signed)
Per Robyne Askew okay to send in triamcinolone cream for the patient to use on his face for a few days to calm down the inflammation. Phone call to patient's wife with [facility] recommendations. Patient's wife aware.

## 2021-12-22 ENCOUNTER — Ambulatory Visit: Payer: Medicare HMO | Admitting: Physician Assistant

## 2021-12-22 ENCOUNTER — Encounter: Payer: Self-pay | Admitting: Physician Assistant

## 2021-12-22 DIAGNOSIS — L570 Actinic keratosis: Secondary | ICD-10-CM | POA: Diagnosis not present

## 2022-01-06 NOTE — Progress Notes (Signed)
Surgery orders requested via Epic inbox. °

## 2022-01-07 ENCOUNTER — Other Ambulatory Visit (HOSPITAL_COMMUNITY): Payer: Medicare HMO

## 2022-01-07 NOTE — H&P (Signed)
TOTAL KNEE ADMISSION H&P ? ?Patient is being admitted for left total knee arthroplasty. ? ?Subjective: ? ?Chief Complaint:left knee pain. ? ?HPI: Joel Kim, 76 y.o. male, has a history of pain and functional disability in the left knee due to arthritis and has failed non-surgical conservative treatments for greater than 12 weeks to includecorticosteriod injections, viscosupplementation injections, and activity modification.  Onset of symptoms was gradual, starting 5 years ago with gradually worsening course since that time. The patient noted no past surgery on the left knee(s).  Patient currently rates pain in the left knee(s) at 6 out of 10 with activity. Patient has worsening of pain with activity and weight bearing, pain that interferes with activities of daily living, pain with passive range of motion, crepitus, and joint swelling.  Patient has evidence of subchondral sclerosis, periarticular osteophytes, and joint space narrowing by imaging studies. This patient has had  No previous injury . There is no active infection. ? ?Patient Active Problem List  ? Diagnosis Date Noted  ? Atypical chest pain 08/15/2018  ? Hyperlipidemia 08/15/2018  ? Family history of heart disease 08/15/2018  ? Hypertension 05/31/2018  ? Hypothyroidism 05/31/2018  ? Acute pancreatitis 05/31/2018  ? Mild renal insufficiency 05/31/2018  ? ?Past Medical History:  ?Diagnosis Date  ? Asthma   ? Atypical nevus 01/26/2006  ? right side of body (slight)  ? Basal cell carcinoma 11/17/2015  ? left ear rim tx cx3 25f  ? Cancer ([facility]   ? Diabetes mellitus without complication ([facility]   ? Hypertension   ? Hypothyroidism   ? Insomnia   ? 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 Melanoma ([facility] 10/10/2014  ? right ant. shoulder (exc)  ? Pancreatitis   ? Spondylosis of lumbar spine   ? Stroke ([facility]   ? Thyroid disease   ? TIA (transient ischemic attack)   ?  ?Past Surgical History:  ?Procedure Laterality Date  ? BACK SURGERY    ? CHOLECYSTECTOMY    ?  FOOT SURGERY    ? JOINT REPLACEMENT    ? rightt knee replacement    ?  ?No current facility-administered medications for this encounter.  ? ?Current Outpatient Medications  ?Medication Sig Dispense Refill Last Dose  ? albuterol (PROVENTIL HFA;VENTOLIN HFA) 108 (90 Base) MCG/ACT inhaler Inhale 2 puffs into the lungs every 6 (six) hours as needed. 1 Inhaler 0   ? allopurinol (ZYLOPRIM) 100 MG tablet Take 100 mg by mouth daily. (Patient not taking: Reported on 12/22/2021)     ? amLODipine (NORVASC) 5 MG tablet Take 5 mg by mouth daily.     ? cetirizine (ZYRTEC) 10 MG tablet Take 10 mg by mouth at bedtime.      ? Cholecalciferol (VITAMIN D3) 1000 UNITS CAPS Take 1,000 Units by mouth daily.      ? clobetasol ointment (TEMOVATE) 09.92% Apply 1 application topically 2 (two) times daily. 30 g 0   ? Evolocumab 140 MG/ML SOAJ Inject into the skin.     ? Fluorouracil (TOLAK) 4 % CREA Apply to affected area Monday-Sunday qhs x 2 weeks (Patient not taking: Reported on 12/22/2021) 40 g 1   ? hydrochlorothiazide (HYDRODIURIL) 25 MG tablet Take 25 mg by mouth daily.     ? levothyroxine (SYNTHROID) 100 MCG tablet      ? metoprolol succinate (TOPROL-XL) 50 MG 24 hr tablet Take 50 mg by mouth at bedtime. Take with or immediately following a meal. (Patient not taking: Reported on 12/22/2021)     ?  Omega-3 Fatty Acids (FISH OIL ULTRA) 1000 MG CAPS Take 1,000 mg by mouth at bedtime.      ? Oxycodone HCl 10 MG TABS Take by mouth.     ? pantoprazole (PROTONIX) 40 MG tablet Take 1 tablet (40 mg total) by mouth daily. 30 tablet 3   ? predniSONE (DELTASONE) 10 MG tablet Take 10 mg by mouth daily with breakfast. (Patient not taking: Reported on 12/22/2021)     ? SUMAtriptan (IMITREX) 50 MG tablet Take 1 tablet (50 mg total) by mouth as needed. Every 2 hrs as needed 10 tablet 0   ? testosterone cypionate (DEPOTESTOSTERONE CYPIONATE) 200 MG/ML injection Inject into the muscle.     ? traZODone (DESYREL) 50 MG tablet Take by mouth.     ? triamcinolone  cream (KENALOG) 0.1 % Apply 1 application topically daily. 15 g 0   ? valACYclovir (VALTREX) 1000 MG tablet TAKE TWO TABLETS BY MOUTH TWICE DAILY     ? VITAMIN D PO Take 1,000 Units by mouth.     ? ?Allergies  ?Allergen Reactions  ? Tapentadol Rash and Other (See Comments)  ?  Breaks patient out  ?Breaks patient out  ? ?Breaks patient out  ?Breaks patient out  ?Breaks patient out  ?Breaks patient out   ? Paroxetine Hcl Other (See Comments)  ?  Irritability  ?Irritability  ? ?Irritability  ?Irritability  ?Irritability   ? Statins Other (See Comments)  ?  Muscle aches  ? Adhesive [Tape]   ?  Breaks patient out   ? Ciprofloxacin Hives  ? Flagyl [Metronidazole]   ? Mannitol   ? Reclast [Zoledronic Acid]   ? Water For Injection [employer], Sterile]   ?  ?Social History  ? ?Tobacco Use  ? Smoking status: Never  ? Smokeless tobacco: Never  ?Substance Use Topics  ? Alcohol use: No  ?  ?No family history on file.  ? ?Review of Systems  ?All other systems reviewed and are negative. ? ?Objective: ? ?Physical Exam ?Vitals reviewed.  ?Constitutional:   ?   Appearance: Normal appearance.  ?HENT:  ?   Head: Normocephalic and atraumatic.  ?Eyes:  ?   Extraocular Movements: Extraocular movements intact.  ?Neck:  ?   Vascular: No carotid bruit.  ?Cardiovascular:  ?   Rate and Rhythm: Normal rate and regular rhythm.  ?   Pulses: Normal pulses.  ?   Heart sounds: Normal heart sounds. No murmur heard. ?  No friction rub. No gallop.  ?Pulmonary:  ?   Effort: Pulmonary effort is normal. No respiratory distress.  ?   Breath sounds: Normal breath sounds. No stridor. No wheezing, rhonchi or rales.  ?Chest:  ?   Chest wall: No tenderness.  ?Musculoskeletal:     ?   General: Swelling and tenderness (medial and lateral joint line) present.  ?   Cervical back: Normal range of motion and neck supple. No tenderness.  ?Skin: ?   General: Skin is warm and dry.  ?   Capillary Refill: Capillary refill takes less than 2 seconds.  ?Neurological:  ?    General: No focal deficit present.  ?   Mental Status: He is alert and oriented to person, place, and time.  ?Psychiatric:     ?   Mood and Affect: Mood normal.     ?   Behavior: Behavior normal.     ?   Thought Content: Thought content normal.     ?   Judgment: Judgment  normal.  ? ? ?Vital signs in last 24 hours: ?BP: ()/()  ?Arterial Line BP: ()/()  ? ?Labs: ? ? ?Estimated body mass index is 27.46 kg/m? as calculated from the following: ?  Height as of 01/29/21: '5\' 5"'$  (1.651 m). ?  Weight as of 01/29/21: 74.8 kg. ? ? ?Imaging Review ?Plain radiographs demonstrate severe degenerative joint disease of the left knee(s). The overall alignment ismild varus. The bone quality appears to be good for age and reported activity level. ? ? ? ? ? ?Assessment/Plan: ? ?End stage arthritis, left knee  ? ?The patient history, physical examination, clinical judgment of the provider and imaging studies are consistent with end stage degenerative joint disease of the left knee(s) and total knee arthroplasty is deemed medically necessary. The treatment options including medical management, injection therapy arthroscopy and arthroplasty were discussed at length. The risks and benefits of total knee arthroplasty were presented and reviewed. The risks due to aseptic loosening, infection, stiffness, patella tracking problems, thromboembolic complications and other imponderables were discussed. The patient acknowledged the explanation, agreed to proceed with the plan and consent was signed. Patient is being admitted for inpatient treatment for surgery, pain control, PT, OT, prophylactic antibiotics, VTE prophylaxis, progressive ambulation and ADL's and discharge planning. The patient is planning to be discharged  home with outpatient rehab at [facility] in [place] . He already has DME needs at home.  ? ?He will needs ASA 81 mg BID for DVT ppx s/p surgery. ?Will spend the night  ? ? ?Patient's anticipated LOS is less than 2 midnights, meeting  these requirements: ?- Younger than 47 ?- Lives within 1 hour of care ?- Has a competent adult at home to recover with post-op recover ?- NO history of ? - Chronic pain requiring opiods ? - Diabetes ? [place]

## 2022-01-10 ENCOUNTER — Encounter: Payer: Self-pay | Admitting: Physician Assistant

## 2022-01-10 NOTE — Progress Notes (Signed)
? ?  Follow-Up Visit ?  ?Subjective  ?Joel Kim is a 76 y.o. male who presents for the following: Follow-up (Follow up tolak cream- very good reaction). ? ? ?The following portions of the chart were reviewed this encounter and updated as appropriate:  Tobacco  Allergies  Meds  Problems  Med Hx  Surg Hx  Fam Hx   ?  ? ?Objective  ?Well appearing patient in no apparent distress; mood and affect are within normal limits. ? ?A focused examination was performed including face. Relevant physical exam findings are noted in the Assessment and Plan. ? ?Head - Anterior (Face) ? ? ? ?Assessment & Plan  ?AK (actinic keratosis) ?Head - Anterior (Face) ? ?Much smoother after brisk reaction. Retreat next winter.  ? ?Related Medications ?Fluorouracil (TOLAK) 4 % CREA ?Apply to affected area Monday-Sunday qhs x 2 weeks ? ? ? ?I, Roshini Fulwider, PA-C, have reviewed all documentation's for this visit.  The documentation on 01/10/22 for the exam, diagnosis, procedures and orders are all accurate and complete. ?

## 2022-01-12 NOTE — Patient Instructions (Addendum)
2 VISITORS ARE  ALLOWED TO COME WITH YOU AND STAY IN THE WAITING ROOM ONLY DURING PRE OP AND PROCEDURE.   ?**NO VISITORS ARE ALLOWED IN THE SHORT STAY AREA OR RECOVERY ROOM!!** ? ?IF YOU WILL BE ADMITTED INTO THE HOSPITAL YOU ARE ALLOWED 4 SUPPORT PEOPLE DURING VISITATION HOURS ONLY (7 AM -8PM)   ?The support person(s) must pass our screening, gel in and out, and wear a mask at all times, including in the patient?s room. ?Patients must also wear a mask when staff or their support person are in the room. ?Visitors GUEST BADGE MUST BE WORN VISIBLY  ?One adult visitor may remain with you overnight and MUST be in the room by 8 P.M. ?  ? ? Your procedure is scheduled on: 01/19/22 ? ? Report to [facility] Main Entrance ? ?  Report to admitting at 7:15 AM ? ? Call this number if you have problems the morning of surgery [phone] ? ? Do not eat food :After Midnight. ? ? After Midnight you may have the following liquids until _7:00_____ AM DAY OF SURGERY ? ?Water ?Black Coffee (sugar ok, NO MILK/CREAM OR CREAMERS)  ?Tea (sugar ok, NO MILK/CREAM OR CREAMERS) regular and decaf                             ?Plain Jell-O (NO RED)                                           ?Fruit ices (not with fruit pulp, NO RED)                                     ?Popsicles (NO RED)                                                                  ?Juice: apple, WHITE grape, WHITE cranberry ?Sports drinks like Gatorade (NO RED) ?Clear broth(vegetable,chicken,beef) ? ?             ? ?  ?  ?The day of surgery:  ?Drink ONE (1) r G2 at 6:45 AM the morning of surgery. Drink in one sitting. Do not sip.  ?This drink was given to you during your hospital  ?pre-op appointment visit. ?Nothing else to drink after completing the  ?G2. At 7:00 am ?  ?       If you have questions, please contact your surgeon?s office. ? ? ?  ?  ?Oral Hygiene is also important to reduce your risk of infection.                                    ?Remember - BRUSH  YOUR TEETH THE MORNING OF SURGERY WITH YOUR REGULAR TOOTHPASTE ? ? Do NOT smoke after Midnight ? ? Take these medicines the morning of surgery with A SIP OF WATER: Amlodipine, Levothyroxine, Pantoprazole ? ?Use your inhaler and bring it with you.    Bring  your spinal stimulator remote with you. ? ?DO NOT TAKE ANY ORAL DIABETIC MEDICATIONS DAY OF YOUR SURGERY ? ?                  ?           You may not have any metal on your body including hair pins, jewelry, and body piercing ? ?           Do not wear  lotions, powders, perfumes/cologne, or deodorant ? ? ?            Men may shave face and neck. ? ? Do not bring valuables to the hospital. [facility] NOT ?            RESPONSIBLE   FOR VALUABLES. ? ? Contacts, dentures or bridgework may not be worn into surgery. ? ? Bring small overnight bag day of surgery. ?  ? ? Special Instructions: Bring a copy of your healthcare power of attorney and living will documents the day of surgery if you haven't scanned them before. ? ?            Please read over the following fact sheets you were given: IF [place] [phone] ? ?    - Preparing for Surgery ?Before surgery, you can play an important role.  Because skin is not sterile, your skin needs to be as free of germs as possible.  You can reduce the number of germs on your skin by washing with CHG (chlorahexidine gluconate) soap before surgery.  CHG is an antiseptic cleaner which kills germs and bonds with the skin to continue killing germs even after washing. ?Please DO NOT use if you have an allergy to CHG or antibacterial soaps.  If your skin becomes reddened/irritated stop using the CHG and inform your nurse when you arrive at Short Stay.  You may shave your face/neck. ?Please follow these instructions carefully: ? 1.  Shower with CHG Soap the night before surgery and the  morning of Surgery. ? 2.  If you choose to wash your hair, wash your hair first as  usual with your  normal  shampoo. ? 3.  After you shampoo, rinse your hair and body thoroughly to remove the  shampoo.                 ?           4.  Use CHG as you would any other liquid soap.  You can apply chg directly  to the skin and wash  ?                     Gently with a scrungie or clean washcloth. ? 5.  Apply the CHG Soap to your body ONLY FROM THE NECK DOWN.   Do not use on face/ open      ?                     Wound or open sores. Avoid contact with eyes, ears mouth and genitals (private parts).  ?                     Production manager,  Genitals (private parts) with your normal soap. ?            6.  Wash thoroughly, paying special attention to the area where your surgery  will be performed. ?  7.  Thoroughly rinse your body with warm water from the neck down. ? 8.  DO NOT shower/wash with your normal soap after using and rinsing off  the CHG Soap. ?               9.  Pat yourself dry with a clean towel. ?           10.  Wear clean pajamas. ?           11.  Place clean sheets on your bed the night of your first shower and do not  sleep with pets. ?Day of Surgery : ?Do not apply any lotions/deodorants the morning of surgery.  Please wear clean clothes to the hospital/surgery center. ? ?FAILURE TO FOLLOW THESE INSTRUCTIONS MAY RESULT IN THE CANCELLATION OF YOUR SURGERY ? ? ? ? ?________________________________________________________________________  ? ?Incentive Spirometer ? ?An incentive spirometer is a tool that can help keep your lungs clear and active. This tool measures how well you are filling your lungs with each breath. Taking long deep breaths may help reverse or decrease the chance of developing breathing (pulmonary) problems (especially infection) following: ?A long period of time when you are unable to move or be active. ?BEFORE THE PROCEDURE  ?If the spirometer includes an indicator to show your best effort, your nurse or respiratory therapist will set it to a desired goal. ?If possible, sit up  straight or lean slightly forward. Try not to slouch. ?Hold the incentive spirometer in an upright position. ?INSTRUCTIONS FOR USE  ?Sit on the edge of your bed if possible, or sit up as far as you can in bed or on a chair. ?Hold the incentive spirometer in an upright position. ?Breathe out normally. ?Place the mouthpiece in your mouth and seal your lips tightly around it. ?Breathe in slowly and as deeply as possible, raising the piston or the ball toward the top of the column. ?Hold your breath for 3-5 seconds or for as long as possible. Allow the piston or ball to fall to the bottom of the column. ?Remove the mouthpiece from your mouth and breathe out normally. ?Rest for a few seconds and repeat Steps 1 through 7 at least 10 times every 1-2 hours when you are awake. Take your time and take a few normal breaths between deep breaths. ?The spirometer may include an indicator to show your best effort. Use the indicator as a goal to work toward during each repetition. ?After each set of 10 deep breaths, practice coughing to be sure your lungs are clear. If you have an incision (the cut made at the time of surgery), support your incision when coughing by placing a pillow or rolled up towels firmly against it. ?Once you are able to get out of bed, walk around indoors and cough well. You may stop using the incentive spirometer when instructed by your caregiver.  ?RISKS AND COMPLICATIONS ?Take your time so you do not get dizzy or light-headed. ?If you are in pain, you may need to take or ask for pain medication before doing incentive spirometry. It is harder to take a deep breath if you are having pain. ?AFTER USE ?Rest and breathe slowly and easily. ?It can be helpful to keep track of a log of your progress. Your caregiver can provide you with a simple table to help with this. ?If you are using the spirometer at home, follow these instructions: ?SEEK MEDICAL CARE IF:  ?You are having difficultly using the spirometer. ?You  have trouble using the spirometer as often as instructed. ?Your pain medication is not giving enough relief while using the spirometer. ?You develop fever of 100.5? F (38.1? C) or higher. ?[place]

## 2022-01-13 ENCOUNTER — Other Ambulatory Visit: Payer: Self-pay

## 2022-01-13 ENCOUNTER — Encounter (HOSPITAL_COMMUNITY): Payer: Self-pay

## 2022-01-13 ENCOUNTER — Encounter (HOSPITAL_COMMUNITY)
Admission: RE | Admit: 2022-01-13 | Discharge: 2022-01-13 | Disposition: A | Discharge status: Home / Self Care | Payer: Medicare HMO | Source: Ambulatory Visit | Attending: Specialist | Admitting: Specialist

## 2022-01-13 VITALS — BP 166/75 | HR 84 | Temp 98.2°F | Resp 18

## 2022-01-13 DIAGNOSIS — Z01812 Encounter for preprocedural laboratory examination: Secondary | ICD-10-CM | POA: Insufficient documentation

## 2022-01-13 DIAGNOSIS — Z01818 Encounter for other preprocedural examination: Secondary | ICD-10-CM

## 2022-01-13 DIAGNOSIS — E119 Type 2 diabetes mellitus without complications: Secondary | ICD-10-CM | POA: Diagnosis not present

## 2022-01-13 DIAGNOSIS — M1712 Unilateral primary osteoarthritis, left knee: Secondary | ICD-10-CM | POA: Insufficient documentation

## 2022-01-13 HISTORY — DX: Prediabetes: R73.03

## 2022-01-13 HISTORY — DX: Malignant neoplasm of prostate: C61

## 2022-01-13 HISTORY — DX: Myoneural disorder, unspecified: G70.9

## 2022-01-13 LAB — COMPREHENSIVE METABOLIC PANEL WITH GFR
ALT: 59 U/L — ABNORMAL HIGH (ref 0–44)
AST: 67 U/L — ABNORMAL HIGH (ref 15–41)
Albumin: 3.9 g/dL (ref 3.5–5.0)
Alkaline Phosphatase: 93 U/L (ref 38–126)
Anion gap: 8 (ref 5–15)
BUN: 19 mg/dL (ref 8–23)
CO2: 29 mmol/L (ref 22–32)
Calcium: 9.1 mg/dL (ref 8.9–10.3)
Chloride: 102 mmol/L (ref 98–111)
Creatinine, Ser: 1.1 mg/dL (ref 0.61–1.24)
GFR, Estimated: >60 mL/min
Glucose, Bld: 110 mg/dL — ABNORMAL HIGH (ref 70–99)
Potassium: 3.3 mmol/L — ABNORMAL LOW (ref 3.5–5.1)
Sodium: 139 mmol/L (ref 135–145)
Total Bilirubin: 1.3 mg/dL — ABNORMAL HIGH (ref 0.3–1.2)
Total Protein: 7 g/dL (ref 6.5–8.1)

## 2022-01-13 LAB — CBC
HCT: 44.1 % (ref 39.0–52.0)
Hemoglobin: 15.5 g/dL (ref 13.0–17.0)
MCH: 31.8 pg (ref 26.0–34.0)
MCHC: 35.1 g/dL (ref 30.0–36.0)
MCV: 90.4 fL (ref 80.0–100.0)
Platelets: 224 K/uL (ref 150–400)
RBC: 4.88 MIL/uL (ref 4.22–5.81)
RDW: 13.4 % (ref 11.5–15.5)
WBC: 9.5 K/uL (ref 4.0–10.5)
nRBC: 0 % (ref 0.0–0.2)

## 2022-01-13 LAB — SURGICAL PCR SCREEN
MRSA, PCR: POSITIVE — AB
Staphylococcus aureus: POSITIVE — AB

## 2022-01-13 LAB — GLUCOSE, CAPILLARY: Glucose-Capillary: 110 mg/dL — ABNORMAL HIGH (ref 70–99)

## 2022-01-13 NOTE — Progress Notes (Signed)
Anesthesia note: ? ?Bowel prep reminder:NA ? ?PCP - Dr. Diamantina Monks ?Cardiologist - ?Other-  ? ?Chest x-ray -01/29/21-Epic  ?EKG - 02/01/21-epic ?Stress Test - 2019-epic ?ECHO - 2019-epic ?Cardiac Cath - no ? ?Pacemaker/ICD device last checked:NA ? ?Sleep Study - yes ?CPAP - stopped using it after teeth extractions ? ?Pt is pre diabetic-yes ?Fasting Blood Sugar -  ?Checks Blood Sugar _____ ? ?Blood Thinner:NA ?Blood Thinner Instructions: ?Aspirin Instructions: ?Last Dose: ? ?Anesthesia review: yes ? ?Patient denies shortness of breath, fever, cough and chest pain at PAT appointment ?Pt doesn't have SOB with activities ? ?Patient verbalized understanding of instructions that were given to them at the PAT appointment. Patient was also instructed that they will need to review over the PAT instructions again at home before surgery. Yes. His wife was at the PAT visit. ?

## 2022-01-14 LAB — HEMOGLOBIN A1C
Hgb A1c MFr Bld: 5.8 % — ABNORMAL HIGH (ref 4.8–5.6)
Mean Plasma Glucose: 119.76 mg/dL

## 2022-01-19 ENCOUNTER — Other Ambulatory Visit: Payer: Self-pay

## 2022-01-19 ENCOUNTER — Ambulatory Visit (HOSPITAL_COMMUNITY): Payer: Medicare HMO | Admitting: Physician Assistant

## 2022-01-19 ENCOUNTER — Observation Stay (HOSPITAL_COMMUNITY)
Admission: RE | Admit: 2022-01-19 | Discharge: 2022-01-20 | Disposition: A | Discharge status: Home / Self Care | Payer: Medicare HMO | Source: Ambulatory Visit | Attending: Specialist | Admitting: Specialist

## 2022-01-19 ENCOUNTER — Ambulatory Visit (HOSPITAL_BASED_OUTPATIENT_CLINIC_OR_DEPARTMENT_OTHER): Payer: Medicare HMO | Admitting: Anesthesiology

## 2022-01-19 ENCOUNTER — Encounter (HOSPITAL_COMMUNITY)
Admission: RE | Disposition: A | Discharge status: Home / Self Care | Payer: Self-pay | Source: Ambulatory Visit | Attending: Specialist

## 2022-01-19 ENCOUNTER — Encounter (HOSPITAL_COMMUNITY): Payer: Self-pay | Admitting: Specialist

## 2022-01-19 DIAGNOSIS — Z79899 Other long term (current) drug therapy: Secondary | ICD-10-CM | POA: Diagnosis not present

## 2022-01-19 DIAGNOSIS — Z96651 Presence of right artificial knee joint: Secondary | ICD-10-CM | POA: Insufficient documentation

## 2022-01-19 DIAGNOSIS — M1712 Unilateral primary osteoarthritis, left knee: Secondary | ICD-10-CM | POA: Diagnosis present

## 2022-01-19 DIAGNOSIS — I10 Essential (primary) hypertension: Secondary | ICD-10-CM | POA: Diagnosis not present

## 2022-01-19 DIAGNOSIS — E039 Hypothyroidism, unspecified: Secondary | ICD-10-CM | POA: Diagnosis not present

## 2022-01-19 DIAGNOSIS — Z85828 Personal history of other malignant neoplasm of skin: Secondary | ICD-10-CM | POA: Insufficient documentation

## 2022-01-19 DIAGNOSIS — Z8673 Personal history of transient ischemic attack (TIA), and cerebral infarction without residual deficits: Secondary | ICD-10-CM | POA: Diagnosis not present

## 2022-01-19 DIAGNOSIS — E119 Type 2 diabetes mellitus without complications: Secondary | ICD-10-CM | POA: Insufficient documentation

## 2022-01-19 DIAGNOSIS — J45909 Unspecified asthma, uncomplicated: Secondary | ICD-10-CM | POA: Insufficient documentation

## 2022-01-19 HISTORY — PX: TOTAL KNEE ARTHROPLASTY: SHX125

## 2022-01-19 LAB — GLUCOSE, CAPILLARY: Glucose-Capillary: 122 mg/dL — ABNORMAL HIGH (ref 70–99)

## 2022-01-19 LAB — TYPE AND SCREEN
ABO/RH(D): A NEG
Antibody Screen: NEGATIVE

## 2022-01-19 LAB — ABO/RH: ABO/RH(D): A NEG

## 2022-01-19 SURGERY — ARTHROPLASTY, KNEE, TOTAL
Anesthesia: Regional | Site: Knee | Laterality: Left

## 2022-01-19 MED ORDER — SODIUM CHLORIDE 0.9 % IV SOLN
INTRAVENOUS | Status: DC | PRN
  Administered 2022-01-19: 80 mL

## 2022-01-19 MED ORDER — ACETAMINOPHEN 10 MG/ML IV SOLN: 1000.0000 mg | Freq: Once | INTRAVENOUS | Status: DC | PRN

## 2022-01-19 MED ORDER — AMLODIPINE BESYLATE 5 MG PO TABS
5.0000 mg | ORAL_TABLET | Freq: Every day | ORAL | Status: DC
  Administered 2022-01-19: 5 mg via ORAL
  Filled 2022-01-19: qty 1

## 2022-01-19 MED ORDER — SUMATRIPTAN SUCCINATE 50 MG PO TABS
50.0000 mg | ORAL_TABLET | ORAL | Status: DC | PRN
  Filled 2022-01-19: qty 1

## 2022-01-19 MED ORDER — BISACODYL 5 MG PO TBEC: 5.0000 mg | DELAYED_RELEASE_TABLET | Freq: Every day | ORAL | Status: DC | PRN

## 2022-01-19 MED ORDER — OXYCODONE HCL 5 MG PO TABS: 5.0000 mg | ORAL_TABLET | ORAL | Status: DC | PRN

## 2022-01-19 MED ORDER — MIDAZOLAM HCL 2 MG/2ML IJ SOLN
INTRAMUSCULAR | Status: AC
  Administered 2022-01-19: 1 mg via INTRAVENOUS
  Filled 2022-01-19: qty 2

## 2022-01-19 MED ORDER — HYDROMORPHONE HCL 1 MG/ML IJ SOLN
0.5000 mg | INTRAMUSCULAR | Status: DC | PRN
  Administered 2022-01-19: 1 mg via INTRAVENOUS
  Filled 2022-01-19: qty 1

## 2022-01-19 MED ORDER — ORAL CARE MOUTH RINSE: 15.0000 mL | Freq: Once | OROMUCOSAL | Status: AC

## 2022-01-19 MED ORDER — METHOCARBAMOL 500 MG IVPB - SIMPLE MED
500.0000 mg | Freq: Four times a day (QID) | INTRAVENOUS | Status: DC | PRN
  Administered 2022-01-19: 500 mg via INTRAVENOUS
  Filled 2022-01-19: qty 50

## 2022-01-19 MED ORDER — BUPIVACAINE LIPOSOME 1.3 % IJ SUSP
INTRAMUSCULAR | Status: AC
  Filled 2022-01-19: qty 20

## 2022-01-19 MED ORDER — DIPHENHYDRAMINE HCL 12.5 MG/5ML PO ELIX: 12.5000 mg | ORAL_SOLUTION | ORAL | Status: DC | PRN

## 2022-01-19 MED ORDER — LACTATED RINGERS IV SOLN: INTRAVENOUS | Status: DC

## 2022-01-19 MED ORDER — METHOCARBAMOL 500 MG IVPB - SIMPLE MED
INTRAVENOUS | Status: AC
  Filled 2022-01-19: qty 50

## 2022-01-19 MED ORDER — FENTANYL CITRATE (PF) 100 MCG/2ML IJ SOLN
INTRAMUSCULAR | Status: AC
  Filled 2022-01-19: qty 2

## 2022-01-19 MED ORDER — FENTANYL CITRATE PF 50 MCG/ML IJ SOSY
PREFILLED_SYRINGE | INTRAMUSCULAR | Status: AC
  Administered 2022-01-19: 50 ug via INTRAVENOUS
  Filled 2022-01-19: qty 2

## 2022-01-19 MED ORDER — FENTANYL CITRATE PF 50 MCG/ML IJ SOSY
25.0000 ug | PREFILLED_SYRINGE | INTRAMUSCULAR | Status: DC | PRN
  Administered 2022-01-19 (×2): 50 ug via INTRAVENOUS

## 2022-01-19 MED ORDER — PROPOFOL 10 MG/ML IV BOLUS
INTRAVENOUS | Status: DC | PRN
  Administered 2022-01-19 (×2): 20 mg via INTRAVENOUS
  Administered 2022-01-19: 140 mg via INTRAVENOUS
  Administered 2022-01-19: 20 mg via INTRAVENOUS

## 2022-01-19 MED ORDER — SENNOSIDES-DOCUSATE SODIUM 8.6-50 MG PO TABS: 1.0000 | ORAL_TABLET | Freq: Every evening | ORAL | Status: DC | PRN

## 2022-01-19 MED ORDER — MIDAZOLAM HCL 2 MG/2ML IJ SOLN
2.0000 mg | INTRAMUSCULAR | Status: DC
Start: 2022-01-19 — End: 2022-01-19

## 2022-01-19 MED ORDER — ONDANSETRON HCL 4 MG PO TABS
4.0000 mg | ORAL_TABLET | Freq: Four times a day (QID) | ORAL | Status: DC | PRN
Start: 2022-01-19 — End: 2022-01-20

## 2022-01-19 MED ORDER — PHENYLEPHRINE 80 MCG/ML (10ML) SYRINGE FOR IV PUSH (FOR BLOOD PRESSURE SUPPORT)
PREFILLED_SYRINGE | INTRAVENOUS | Status: DC | PRN
  Administered 2022-01-19: 160 ug via INTRAVENOUS

## 2022-01-19 MED ORDER — LIDOCAINE 2% (20 MG/ML) 5 ML SYRINGE
INTRAMUSCULAR | Status: DC | PRN
  Administered 2022-01-19: 80 mg via INTRAVENOUS

## 2022-01-19 MED ORDER — BUPIVACAINE-EPINEPHRINE (PF) 0.5% -1:200000 IJ SOLN
INTRAMUSCULAR | Status: DC | PRN
  Administered 2022-01-19: 30 mL via PERINEURAL

## 2022-01-19 MED ORDER — TRANEXAMIC ACID-NACL 1000-0.7 MG/100ML-% IV SOLN
1000.0000 mg | INTRAVENOUS | Status: AC
  Administered 2022-01-19: 1000 mg via INTRAVENOUS
  Filled 2022-01-19: qty 100

## 2022-01-19 MED ORDER — HYDROMORPHONE HCL 1 MG/ML IJ SOLN
0.5000 mg | INTRAMUSCULAR | Status: DC | PRN
  Administered 2022-01-19 (×2): 0.5 mg via INTRAVENOUS

## 2022-01-19 MED ORDER — 0.9 % SODIUM CHLORIDE (POUR BTL) OPTIME
TOPICAL | Status: DC | PRN
Start: 2022-01-19 — End: 2022-01-19
  Administered 2022-01-19: 450 mL

## 2022-01-19 MED ORDER — AMISULPRIDE (ANTIEMETIC) 5 MG/2ML IV SOLN: 10.0000 mg | Freq: Once | INTRAVENOUS | Status: DC | PRN

## 2022-01-19 MED ORDER — FENTANYL CITRATE PF 50 MCG/ML IJ SOSY
PREFILLED_SYRINGE | INTRAMUSCULAR | Status: AC
  Administered 2022-01-19: 50 ug via INTRAVENOUS
  Filled 2022-01-19: qty 3

## 2022-01-19 MED ORDER — VANCOMYCIN HCL IN DEXTROSE 1-5 GM/200ML-% IV SOLN
1000.0000 mg | Freq: Once | INTRAVENOUS | Status: AC
  Administered 2022-01-19: 1000 mg via INTRAVENOUS
  Filled 2022-01-19: qty 200

## 2022-01-19 MED ORDER — HYDROMORPHONE HCL 2 MG/ML IJ SOLN
INTRAMUSCULAR | Status: AC
  Filled 2022-01-19: qty 1

## 2022-01-19 MED ORDER — SODIUM CHLORIDE 0.9 % IV SOLN: INTRAVENOUS | Status: DC

## 2022-01-19 MED ORDER — DEXAMETHASONE SODIUM PHOSPHATE 10 MG/ML IJ SOLN
INTRAMUSCULAR | Status: AC
  Filled 2022-01-19: qty 1

## 2022-01-19 MED ORDER — METHOCARBAMOL 500 MG PO TABS
500.0000 mg | ORAL_TABLET | Freq: Four times a day (QID) | ORAL | Status: DC | PRN
  Administered 2022-01-19 – 2022-01-20 (×2): 500 mg via ORAL
  Filled 2022-01-19 (×2): qty 1

## 2022-01-19 MED ORDER — FENTANYL CITRATE PF 50 MCG/ML IJ SOSY: 50.0000 ug | PREFILLED_SYRINGE | INTRAMUSCULAR | Status: DC

## 2022-01-19 MED ORDER — VANCOMYCIN HCL IN DEXTROSE 1-5 GM/200ML-% IV SOLN
1000.0000 mg | INTRAVENOUS | Status: DC
Start: 2022-01-19 — End: 2022-01-19

## 2022-01-19 MED ORDER — TRAMADOL HCL 50 MG PO TABS
50.0000 mg | ORAL_TABLET | Freq: Four times a day (QID) | ORAL | Status: DC
  Administered 2022-01-19 – 2022-01-20 (×4): 50 mg via ORAL
  Filled 2022-01-19 (×4): qty 1

## 2022-01-19 MED ORDER — ACETAMINOPHEN 325 MG PO TABS: 325.0000 mg | ORAL_TABLET | Freq: Four times a day (QID) | ORAL | Status: DC | PRN

## 2022-01-19 MED ORDER — SODIUM CHLORIDE (PF) 0.9 % IJ SOLN
INTRAMUSCULAR | Status: AC
  Filled 2022-01-19: qty 10

## 2022-01-19 MED ORDER — DEXAMETHASONE SODIUM PHOSPHATE 10 MG/ML IJ SOLN
INTRAMUSCULAR | Status: DC | PRN
  Administered 2022-01-19: 10 mg via INTRAVENOUS

## 2022-01-19 MED ORDER — SODIUM CHLORIDE 0.9 % IR SOLN
Status: DC | PRN
  Administered 2022-01-19: 650 mL

## 2022-01-19 MED ORDER — TRAZODONE HCL 50 MG PO TABS
75.0000 mg | ORAL_TABLET | Freq: Every day | ORAL | Status: DC
  Administered 2022-01-19: 75 mg via ORAL
  Filled 2022-01-19: qty 1

## 2022-01-19 MED ORDER — LABETALOL HCL 5 MG/ML IV SOLN
INTRAVENOUS | Status: DC | PRN
  Administered 2022-01-19 (×2): 2.5 mg via INTRAVENOUS
  Administered 2022-01-19: 5 mg via INTRAVENOUS

## 2022-01-19 MED ORDER — KETAMINE HCL 10 MG/ML IJ SOLN
INTRAMUSCULAR | Status: AC
  Filled 2022-01-19: qty 1

## 2022-01-19 MED ORDER — HYDROCHLOROTHIAZIDE 25 MG PO TABS
25.0000 mg | ORAL_TABLET | Freq: Every day | ORAL | Status: DC
  Administered 2022-01-19 – 2022-01-20 (×2): 25 mg via ORAL
  Filled 2022-01-19 (×2): qty 1

## 2022-01-19 MED ORDER — KETAMINE HCL 10 MG/ML IJ SOLN
INTRAMUSCULAR | Status: DC | PRN
  Administered 2022-01-19: 40 mg via INTRAVENOUS

## 2022-01-19 MED ORDER — FENTANYL CITRATE (PF) 100 MCG/2ML IJ SOLN
INTRAMUSCULAR | Status: DC | PRN
  Administered 2022-01-19 (×7): 50 ug via INTRAVENOUS

## 2022-01-19 MED ORDER — ALBUTEROL SULFATE (2.5 MG/3ML) 0.083% IN NEBU: 2.5000 mg | INHALATION_SOLUTION | Freq: Four times a day (QID) | RESPIRATORY_TRACT | Status: DC | PRN

## 2022-01-19 MED ORDER — OXYCODONE HCL 5 MG PO TABS
10.0000 mg | ORAL_TABLET | ORAL | Status: DC | PRN
  Administered 2022-01-19 – 2022-01-20 (×4): 15 mg via ORAL
  Filled 2022-01-19 (×5): qty 3

## 2022-01-19 MED ORDER — BUPIVACAINE LIPOSOME 1.3 % IJ SUSP: 20.0000 mL | Freq: Once | INTRAMUSCULAR | Status: DC

## 2022-01-19 MED ORDER — ONDANSETRON HCL 4 MG/2ML IJ SOLN
INTRAMUSCULAR | Status: DC | PRN
  Administered 2022-01-19: 4 mg via INTRAVENOUS

## 2022-01-19 MED ORDER — ONDANSETRON HCL 4 MG/2ML IJ SOLN
INTRAMUSCULAR | Status: AC
  Filled 2022-01-19: qty 2

## 2022-01-19 MED ORDER — ACETAMINOPHEN 500 MG PO TABS
1000.0000 mg | ORAL_TABLET | Freq: Once | ORAL | Status: DC
Start: 2022-01-19 — End: 2022-01-19

## 2022-01-19 MED ORDER — ENOXAPARIN SODIUM 40 MG/0.4ML IJ SOSY
40.0000 mg | PREFILLED_SYRINGE | INTRAMUSCULAR | Status: DC
  Administered 2022-01-20: 40 mg via SUBCUTANEOUS
  Filled 2022-01-19: qty 0.4

## 2022-01-19 MED ORDER — SODIUM CHLORIDE (PF) 0.9 % IJ SOLN
INTRAMUSCULAR | Status: AC
  Filled 2022-01-19: qty 50

## 2022-01-19 MED ORDER — HYDROMORPHONE HCL 1 MG/ML IJ SOLN
INTRAMUSCULAR | Status: DC | PRN
  Administered 2022-01-19 (×3): .5 mg via INTRAVENOUS

## 2022-01-19 MED ORDER — CHLORHEXIDINE GLUCONATE 0.12 % MT SOLN
15.0000 mL | Freq: Once | OROMUCOSAL | Status: AC
  Administered 2022-01-19: 15 mL via OROMUCOSAL

## 2022-01-19 MED ORDER — PANTOPRAZOLE SODIUM 40 MG PO TBEC
40.0000 mg | DELAYED_RELEASE_TABLET | Freq: Every day | ORAL | Status: DC
  Administered 2022-01-20: 40 mg via ORAL
  Filled 2022-01-19 (×2): qty 1

## 2022-01-19 MED ORDER — POVIDONE-IODINE 10 % EX SWAB
2.0000 "application " | Freq: Once | CUTANEOUS | Status: AC
  Administered 2022-01-19: 2 via TOPICAL

## 2022-01-19 MED ORDER — HYDROMORPHONE HCL 1 MG/ML IJ SOLN
INTRAMUSCULAR | Status: AC
  Administered 2022-01-19: 0.5 mg via INTRAVENOUS
  Filled 2022-01-19: qty 2

## 2022-01-19 MED ORDER — ONDANSETRON HCL 4 MG/2ML IJ SOLN: 4.0000 mg | Freq: Four times a day (QID) | INTRAMUSCULAR | Status: DC | PRN

## 2022-01-19 MED ORDER — LEVOTHYROXINE SODIUM 100 MCG PO TABS
100.0000 ug | ORAL_TABLET | Freq: Every day | ORAL | Status: DC
  Administered 2022-01-20: 100 ug via ORAL
  Filled 2022-01-19: qty 1

## 2022-01-19 MED ORDER — ONDANSETRON HCL 4 MG/2ML IJ SOLN: 4.0000 mg | Freq: Once | INTRAMUSCULAR | Status: DC | PRN

## 2022-01-19 MED ORDER — DEXAMETHASONE SODIUM PHOSPHATE 10 MG/ML IJ SOLN: 8.0000 mg | Freq: Once | INTRAMUSCULAR | Status: DC

## 2022-01-19 MED ORDER — ACETAMINOPHEN 500 MG PO TABS
1000.0000 mg | ORAL_TABLET | Freq: Four times a day (QID) | ORAL | Status: AC
  Administered 2022-01-19 – 2022-01-20 (×3): 1000 mg via ORAL
  Filled 2022-01-19 (×3): qty 2

## 2022-01-19 SURGICAL SUPPLY — 57 items
ADH SKN CLS APL DERMABOND .7 (GAUZE/BANDAGES/DRESSINGS) ×1
ATTUNE MED DOME PAT 38 KNEE (Knees) ×1 IMPLANT
ATTUNE PS FEM LT SZ 5 CEM KNEE (Femur) ×1 IMPLANT
ATTUNE PSRP INSR SZ5 8 KNEE (Insert) ×1 IMPLANT
BAG COUNTER SPONGE SURGICOUNT (BAG) ×3 IMPLANT
BAG SPEC THK2 15X12 ZIP CLS (MISCELLANEOUS) ×1
BAG SPNG CNTER NS LX DISP (BAG) ×1
BAG ZIPLOCK 12X15 (MISCELLANEOUS) ×3 IMPLANT
BASE TIBIA ATTUNE KNEE SYS SZ6 (Knees) IMPLANT
BLADE SAG 18X100X1.27 (BLADE) ×3 IMPLANT
BLADE SAW SGTL 11.0X1.19X90.0M (BLADE) ×3 IMPLANT
BNDG ELASTIC 4X5.8 VLCR STR LF (GAUZE/BANDAGES/DRESSINGS) ×3 IMPLANT
BNDG ELASTIC 6X5.8 VLCR STR LF (GAUZE/BANDAGES/DRESSINGS) ×3 IMPLANT
BOWL SMART MIX CTS (DISPOSABLE) ×3 IMPLANT
BSPLAT TIB 6 CMNT ROT PLAT STR (Knees) ×1 IMPLANT
CEMENT HV SMART SET (Cement) ×2 IMPLANT
COVER SURGICAL LIGHT HANDLE (MISCELLANEOUS) ×3 IMPLANT
CUFF TOURN SGL QUICK 34 (TOURNIQUET CUFF) ×2
CUFF TRNQT CYL 34X4.125X (TOURNIQUET CUFF) ×2 IMPLANT
DERMABOND ADVANCED (GAUZE/BANDAGES/DRESSINGS) ×1
DERMABOND ADVANCED .7 DNX12 (GAUZE/BANDAGES/DRESSINGS) ×2 IMPLANT
DRAPE INCISE IOBAN 66X45 STRL (DRAPES) ×3 IMPLANT
DRAPE U-SHAPE 47X51 STRL (DRAPES) ×3 IMPLANT
DRSG AQUACEL AG ADV 3.5X10 (GAUZE/BANDAGES/DRESSINGS) ×3 IMPLANT
DURAPREP 26ML APPLICATOR (WOUND CARE) ×6 IMPLANT
ELECT REM PT RETURN 15FT ADLT (MISCELLANEOUS) ×3 IMPLANT
GLOVE BIOGEL PI IND STRL 7.5 (GLOVE) ×2 IMPLANT
GLOVE BIOGEL PI IND STRL 8 (GLOVE) ×2 IMPLANT
GLOVE BIOGEL PI INDICATOR 7.5 (GLOVE) ×1
GLOVE BIOGEL PI INDICATOR 8 (GLOVE) ×1
GLOVE ECLIPSE 8.0 STRL XLNG CF (GLOVE) ×2 IMPLANT
GLOVE SURG ORTHO 9.0 STRL STRW (GLOVE) ×2 IMPLANT
GLOVE SURG SS PI 7.0 STRL IVOR (GLOVE) ×3 IMPLANT
GLOVE SURG SS PI 8.0 STRL IVOR (GLOVE) ×1 IMPLANT
HANDPIECE INTERPULSE COAX TIP (DISPOSABLE) ×2
KIT TURNOVER KIT A (KITS) ×1 IMPLANT
NS IRRIG 1000ML POUR BTL (IV SOLUTION) ×3 IMPLANT
PACK TOTAL KNEE CUSTOM (KITS) ×3 IMPLANT
PROTECTOR NERVE ULNAR (MISCELLANEOUS) ×3 IMPLANT
SET HNDPC FAN SPRY TIP SCT (DISPOSABLE) ×2 IMPLANT
SET PAD KNEE POSITIONER (MISCELLANEOUS) ×3 IMPLANT
SOLUTION IRRIG SURGIPHOR (IV SOLUTION) ×2 IMPLANT
SPIKE FLUID TRANSFER (MISCELLANEOUS) ×4 IMPLANT
SPONGE SURGIFOAM ABS GEL 100 (HEMOSTASIS) ×3 IMPLANT
STOCKINETTE 6  STRL (DRAPES) ×2
STOCKINETTE 6 STRL (DRAPES) ×2 IMPLANT
SUT MNCRL AB 3-0 PS2 18 (SUTURE) ×3 IMPLANT
SUT VIC AB 1 CT1 27 (SUTURE) ×6
SUT VIC AB 1 CT1 27XBRD ANTBC (SUTURE) ×6 IMPLANT
SUT VIC AB 2-0 CT1 27 (SUTURE) ×4
SUT VIC AB 2-0 CT1 TAPERPNT 27 (SUTURE) ×4 IMPLANT
SUT VLOC 180 0 24IN GS25 (SUTURE) ×3 IMPLANT
SYR 50ML LL SCALE MARK (SYRINGE) ×2 IMPLANT
TAPE STRIPS DRAPE STRL (GAUZE/BANDAGES/DRESSINGS) ×3 IMPLANT
TIBIA ATTUNE KNEE SYS BASE SZ6 (Knees) ×2 IMPLANT
TRAY CATH INTERMITTENT SS 16FR (CATHETERS) ×3 IMPLANT
WATER STERILE IRR 1000ML POUR (IV SOLUTION) ×6 IMPLANT

## 2022-01-19 NOTE — Evaluation (Signed)
Physical Therapy Evaluation ?Patient Details ?Name: Joel Kim ?MRN: [id number] ?DOB: 1945-11-29 ?Today's Date: 01/19/2022 ? ?History of Present Illness ? 76 yo male s/p L TKA  on 01/19/22. PMH: R TKA, L foot surgery, HTN, spondylosis  ?Clinical Impression ? Pt is s/p TKA resulting in the deficits listed below (see PT Problem List).  ?Pt amb ~ 62' with RW, initial L knee buckling which improved with cues for knee extension/quad activation and distance. Anticipate steady progress in acute setting. ? Pt will benefit from skilled PT to increase their independence and safety with mobility to allow discharge to the venue listed below.  ?   ?   ? ?Recommendations for follow up therapy are one component of a multi-disciplinary discharge planning process, led by the attending physician.  Recommendations may be updated based on patient status, additional functional criteria and insurance authorization. ? ?Follow Up Recommendations Follow physician's recommendations for discharge plan and follow up therapies ? ?  ?Assistance Recommended at Discharge Intermittent Supervision/Assistance  ?Patient can return home with the following ? Help with stairs or ramp for entrance;Assist for transportation;Assistance with cooking/housework ? ?  ?Equipment Recommendations None recommended by PT  ?Recommendations for Other Services ?    ?  ?Functional Status Assessment Patient has had a recent decline in their functional status and demonstrates the ability to make significant improvements in function in a reasonable and predictable amount of time.  ? ?  ?Precautions / Restrictions Precautions ?Precautions: Fall;Knee ?Restrictions ?Weight Bearing Restrictions: No ?LLE Weight Bearing: Weight bearing as tolerated ?Other Position/Activity Restrictions: WBAT  ? ?  ? ?Mobility ? Bed Mobility ?Overal bed mobility: Needs Assistance ?Bed Mobility: Supine to Sit ?  ?  ?Supine to sit: Min guard ?  ?  ?General bed mobility comments: for safety ?   ? ?Transfers ?Overall transfer level: Needs assistance ?Equipment used: Rolling walker (2 wheels) ?Transfers: Sit to/from Stand ?Sit to Stand: Min assist ?  ?  ?  ?  ?  ?General transfer comment: assist with anterior-superior wt shift ?  ? ?Ambulation/Gait ?Ambulation/Gait assistance: Min assist ?Gait Distance (Feet): 40 Feet ?Assistive device: Rolling walker (2 wheels) ?Gait Pattern/deviations: Step-to pattern, Decreased stance time - left ?  ?  ?  ?General Gait Details: verbal cues for sequence, knee extension in stance and use of UEs. slight L knee buckling initially, improved with distance. min assist for balance ? ?Stairs ?  ?  ?  ?  ?  ? ?Wheelchair Mobility ?  ? ?Modified Rankin (Stroke Patients Only) ?  ? ?  ? ?Balance   ?  ?  ?  ?  ?  ?  ?  ?  ?  ?  ?  ?  ?  ?  ?  ?  ?  ?  ?   ? ? ? ?Pertinent Vitals/Pain Pain Assessment ?Pain Assessment: 0-10 ?Pain Score: 4  ?Pain Location: left knee ?Pain Descriptors / Indicators: Aching, Grimacing, Sore ?Pain Intervention(s): Limited activity within patient's tolerance, Monitored during session, Premedicated before session, Repositioned, Ice applied  ? ? ?Home Living Family/patient expects to be discharged to:: Private residence ?Living Arrangements: Spouse/significant other;Children ?Available Help at Discharge: Family ?Type of Home: House ?Home Access: Stairs to enter ?Entrance Stairs-Rails: Right;Left ?Entrance Stairs-Number of Steps: 3-4 ?  ?Home Layout: Multi-level;Able to live on main level with bedroom/bathroom ?Home Equipment: Conservation officer, nature (2 wheels);Other (comment) (ice machine) ?Additional Comments: has Farm in [place]  ?  ?Prior Function Prior Level of Function :  Independent/Modified Independent ?  ?  ?  ?  ?  ?  ?  ?  ?  ? ? ?Hand Dominance  ?   ? ?  ?Extremity/Trunk Assessment  ? Upper Extremity Assessment ?Upper Extremity Assessment: Overall WFL for tasks assessed ?  ? ?Lower Extremity Assessment ?Lower Extremity Assessment: LLE  deficits/detail ?LLE Deficits / Details: ankle grossly WFL - baseline ROM. knee extension and hip flexion 2+ to 3/5, limited by post op pain ?  ? ?   ?Communication  ? Communication: No difficulties  ?Cognition Arousal/Alertness: Awake/alert ?Behavior During Therapy: [facility] for tasks assessed/performed ?Overall Cognitive Status: Within Functional Limits for tasks assessed ?  ?  ?  ?  ?  ?  ?  ?  ?  ?  ?  ?  ?  ?  ?  ?  ?  ?  ?  ? ?  ?General Comments   ? ?  ?Exercises Total Joint Exercises ?Ankle Circles/Pumps: AROM, Both, 10 reps ?Straight Leg Raises: AROM, AAROM, 5 reps, Left  ? ?Assessment/Plan  ?  ?PT Assessment Patient needs continued PT services  ?PT Problem List Decreased strength;Decreased range of motion;Decreased activity tolerance;Decreased balance;Pain;Decreased knowledge of use of DME;Decreased mobility;Decreased knowledge of precautions ? ?   ?  ?PT Treatment Interventions DME instruction;Therapeutic exercise;Gait training;Functional mobility training;Therapeutic activities;Patient/family education;Stair training   ? ?PT Goals (Current goals can be found in the Care Plan section)  ?Acute Rehab PT Goals ?Patient Stated Goal: to get outside ?PT Goal Formulation: With patient ?Time For Goal Achievement: 01/26/22 ?Potential to Achieve Goals: Good ? ?  ?Frequency 7X/week ?  ? ? ?Co-evaluation   ?  ?  ?  ?  ? ? ?  ?AM-PAC PT "6 Clicks" Mobility  ?Outcome Measure Help needed turning from your back to your side while in a flat bed without using bedrails?: A Little ?Help needed moving from lying on your back to sitting on the side of a flat bed without using bedrails?: A Little ?Help needed moving to and from a bed to a chair (including a wheelchair)?: A Little ?Help needed standing up from a chair using your arms (e.g., wheelchair or bedside chair)?: A Little ?Help needed to walk in hospital room?: A Little ?Help needed climbing 3-5 steps with a railing? : A Lot ?6 Click Score: 17 ? ?  ?End of Session Equipment  Utilized During Treatment: Gait belt ?Activity Tolerance: Patient tolerated treatment well ?Patient left: in chair;with call bell/phone within reach;with chair alarm set;with family/visitor present ?  ?PT Visit Diagnosis: Other abnormalities of gait and mobility (R26.89);Difficulty in walking, not elsewhere classified (R26.2) ?  ? ?Time: 7989-2119 ?PT Time Calculation (min) (ACUTE ONLY): 32 min ? ? ?Charges:   PT Evaluation ?$PT Eval Low Complexity: 1 Low ?PT Treatments ?$Gait Training: 8-22 mins ?  ?   ? ? ?Baxter Flattery, PT ? ?Acute Rehab Dept [facility]) [phone] ?Pager [phone] ? ?01/19/2022 ? ? ?Joel Kim ?01/19/2022, 5:39 PM ? ?

## 2022-01-19 NOTE — Interval H&P Note (Signed)
History and Physical Interval Note: ? ?01/19/2022 ?9:49 AM ? ?Joel Kim  has presented today for surgery, with the diagnosis of Left knee osteoarthritis.  The various methods of treatment have been discussed with the patient and family. After consideration of risks, benefits and other options for treatment, the patient has consented to  Procedure(s) with comments: ?TOTAL KNEE ARTHROPLASTY (Left) - adductor canal ?120 as a surgical intervention.  The patient's history has been reviewed, patient examined, no change in status, stable for surgery.  I have reviewed the patient's chart and labs.  Questions were answered to the patient's satisfaction.   ? ? ?[name] ? ? ?

## 2022-01-19 NOTE — Progress Notes (Signed)
AssistedDr. Roanna Banning with left, adductor canal, ultrasound guided block. Side rails up, monitors on throughout procedure. See vital signs in flow sheet. Tolerated Procedure well. ? ?

## 2022-01-19 NOTE — Anesthesia Procedure Notes (Signed)
Anesthesia Regional Block: Adductor canal block  ? ?Pre-Anesthetic Checklist: , timeout performed,  Correct Patient, Correct Site, Correct Laterality,  Correct Procedure,, site marked,  Risks and benefits discussed,  Surgical consent,  Pre-op evaluation,  At surgeon's request and post-op pain management ? ?Laterality: Left ? ?Prep: chloraprep     ?  ?Needles:  ?Injection technique: Single-shot ? ?Needle Type: Echogenic Stimulator Needle   ? ? ?Needle Length: 9cm  ?Needle Gauge: 21  ? ? ? ?Additional Needles: ? ? ?Procedures:,,,, ultrasound used (permanent image in chart),,    ?Narrative:  ?Start time: 01/19/2022 9:15 AM ?End time: 01/19/2022 9:25 AM ?Injection made incrementally with aspirations every 5 mL. ? ?Performed by: Personally  ?Anesthesiologist: Murvin Natal, MD ? ?Additional Notes: ?Functioning IV was confirmed and monitors were applied. A time-out was performed. Hand hygiene and sterile gloves were used. The thigh was placed in a frog-leg position and prepped in a sterile fashion. A 58m 21ga Arrow echogenic stimulator needle was placed using ultrasound guidance.  Negative aspiration and negative test dose prior to incremental administration of local anesthetic. The patient tolerated the procedure well. ? ? ? ? ? ?

## 2022-01-19 NOTE — Transfer of Care (Signed)
Immediate Anesthesia Transfer of Care Note ? ?Patient: Joel Kim ? ?Procedure(s) Performed: TOTAL KNEE ARTHROPLASTY (Left: Knee) ? ?Patient Location: PACU ? ?Anesthesia Type:General ? ?Level of Consciousness: awake and drowsy ? ?Airway & Oxygen Therapy: Patient Spontanous Breathing and Patient connected to face mask oxygen ? ?Post-op Assessment: Report given to RN and Post -op Vital signs reviewed and stable ? ?Post vital signs: Reviewed and stable ? ?Last Vitals:  ?Vitals Value Taken Time  ?BP 183/97 01/19/22 1307  ?Temp 37 ?C 01/19/22 1307  ?Pulse 86 01/19/22 1309  ?Resp 13 01/19/22 1309  ?SpO2 99 % 01/19/22 1309  ?Vitals shown include unvalidated device data. ? ?Last Pain:  ?Vitals:  ? 01/19/22 0925  ?TempSrc:   ?PainSc: 0-No pain  ?   ? ?  ? ?Complications: No notable events documented. ?

## 2022-01-19 NOTE — Plan of Care (Signed)
?  Problem: Education: ?Goal: Knowledge of General Education information will improve ?Description: Including pain rating scale, medication(s)/side effects and non-pharmacologic comfort measures ?Outcome: Progressing ?  ?Problem: Health Behavior/Discharge Planning: ?Goal: Ability to manage health-related needs will improve ?Outcome: Progressing ?  ?Problem: Clinical Measurements: ?Goal: Respiratory complications will improve ?Outcome: Not Applicable ?Goal: Cardiovascular complication will be avoided ?Outcome: Not Applicable ?  ?Problem: Activity: ?Goal: Risk for activity intolerance will decrease ?Outcome: Progressing ?  ?Problem: Nutrition: ?Goal: Adequate nutrition will be maintained ?Outcome: Progressing ?  ?

## 2022-01-19 NOTE — Op Note (Signed)
DATE OF SURGERY:  01/19/2022 ? ?TIME: 12:35 PM ? ?PATIENT NAME:  Joel Kim   ? ?AGE: 76 y.o.  ? ?PRE-OPERATIVE DIAGNOSIS:  Left knee osteoarthritis ? ?POST-OPERATIVE DIAGNOSIS:  Left knee osteoarthritis ? ?PROCEDURE:  Procedure(s): ?TOTAL KNEE ARTHROPLASTY ? ?SURGEON:  Lee Kalt ANDREW8 ? ?ASSISTANT:  Leeanne Haus, PA-C, present and scrubbed throughout the case, critical for assistance with exposure, retraction, instrumentation, and closure. ? ?OPERATIVE IMPLANTS: Depuy PFC Attune Rotating Platform.  Femur size 5, Tibia size 6, Patella size 38 3-peg oval button, with a 8 mm polyethylene insert. ? ? ?PREOPERATIVE INDICATIONS: ? ? ?Joel Kim is a 76 y.o. year old male with end stage bone on bone arthritis of the knee who failed conservative treatment and elected for Total Knee Arthroplasty.  ? ?The risks, benefits, and alternatives were discussed at length including but not limited to the risks of infection, bleeding, nerve injury, stiffness, blood clots, the need for revision surgery, cardiopulmonary complications, among others, and they were willing to proceed. ? ?OPERATIVE DESCRIPTION: ? ?The patient was brought to the operative room and placed in a supine position.  Spinal anesthesia was administered.  IV antibiotics were given.  The lower extremity was prepped and draped in the usual sterile fashion.  Time out was performed.  The leg was elevated and exsanguinated and the tourniquet was inflated. ? ?Anterior quadriceps tendon splitting approach was performed.  The patella was retracted and osteophytes were removed.  The anterior horn of the medial and lateral meniscus was removed and cruciate ligaments resected.  ? ?The distal femur was opened with the drill and the intramedullary distal femoral cutting jig was utilized, set at 5 degrees resecting 10 mm off the distal femur.  Care was taken to protect the collateral ligaments. ? ?The distal femoral sizing jig was applied, taking care to avoid  notching.  Then the 4-in-1 cutting jig was applied and the anterior and posterior femur was cut, along with the chamfer cuts.   ? ?Then the extramedullary tibial cutting jig was utilized making the appropriate cut using the anterior tibial crest as a reference building in appropriate posterior slope.  Care was taken during the cut to protect the medial and collateral ligaments.  The proximal tibia was removed along with the posterior horns of the menisci.  ? ?The posterior medial femoral osteophytes and posterior lateral femoral osteophytes were removed.   ? ?The flexion gap was then measured and was symmetric with the extension gap, measured at 8 ? ?I completed the distal femoral preparation using the appropriate jig to prepare the box. ? ?The patella was then measured, and cut with the saw.   ? ?The proximal tibia sized and prepared accordingly with the reamer and the punch, and then all components were trialed with the trial insert.  The knee was found to have excellent balance and full motion.   ? ?The above named components were then cemented into place and all excess cement was removed.  The trial polyethylene component was in place during cementation, and then was exchanged for the real polyethylene component.   ? ?The knee was easily taken through a range of motion and the patella tracked well and the knee irrigated copiously and the parapatellar and subcutaneous tissue closed with vicryl, and monocryl with steri strips for the skin.  The arthrotomy was closed at 90? of flexion. The wounds were dressed with sterile gauze and the tourniquet released and the patient was awakened and returned to the PACU in  stable and satisfactory condition.  There were no complications. ? ?Total tourniquet time was 79 minutes. ? ? ? ? ? ? ? ? ? ? ? ? ? ? ?  ?

## 2022-01-19 NOTE — Anesthesia Procedure Notes (Signed)
Procedure Name: LMA Insertion ?Date/Time: 01/19/2022 10:51 AM ?Performed by: [place] Vyolet Sakuma C, CRNA ?Pre-anesthesia Checklist: Patient identified, Emergency Drugs available, Suction available and Patient being monitored ?Patient Re-evaluated:Patient Re-evaluated prior to induction ?Oxygen Delivery Method: Circle system utilized ?Preoxygenation: Pre-oxygenation with 100% oxygen ?Induction Type: IV induction ?Ventilation: Mask ventilation without difficulty ?LMA: LMA inserted ?LMA Size: 4.0 ?Number of attempts: 1 ?Airway Equipment and Method: Bite block ?Placement Confirmation: positive ETCO2 ?Tube secured with: Tape ?Dental Injury: Teeth and Oropharynx as per pre-operative assessment  ? ? ? ? ?

## 2022-01-19 NOTE — Plan of Care (Signed)
?  Problem: Education: Goal: Knowledge of General Education information will improve Description: Including pain rating scale, medication(s)/side effects and non-pharmacologic comfort measures Outcome: Progressing   Problem: Health Behavior/Discharge Planning: Goal: Ability to manage health-related needs will improve Outcome: Progressing   Problem: Clinical Measurements: Goal: Ability to maintain clinical measurements within normal limits will improve Outcome: Progressing   Problem: Nutrition: Goal: Adequate nutrition will be maintained Outcome: Progressing   Problem: Coping: Goal: Level of anxiety will decrease Outcome: Progressing   Problem: Elimination: Goal: Will not experience complications related to bowel motility Outcome: Progressing   

## 2022-01-19 NOTE — Interval H&P Note (Signed)
History and Physical Interval Note: ? ?01/19/2022 ?9:49 AM ? ?Joel Kim  has presented today for surgery, with the diagnosis of Left knee osteoarthritis.  The various methods of treatment have been discussed with the patient and family. After consideration of risks, benefits and other options for treatment, the patient has consented to  Procedure(s) with comments: ?TOTAL KNEE ARTHROPLASTY (Left) - adductor canal ?120 as a surgical intervention.  The patient's history has been reviewed, patient examined, no change in status, stable for surgery.  I have reviewed the patient's chart and labs.  Questions were answered to the patient's satisfaction.   ? ? ?Joel Kim ? ? ?

## 2022-01-19 NOTE — Anesthesia Preprocedure Evaluation (Addendum)
Anesthesia Evaluation  ?Patient identified by MRN, date of birth, ID band ?Patient awake ? ? ? ?Reviewed: ?Allergy & Precautions, NPO status , Patient's Chart, lab work & pertinent test results ? ?Airway ?Mallampati: III ? ?TM Distance: >3 FB ?Neck ROM: Full ? ? ? Dental ? ?(+) Edentulous Lower, Edentulous Upper ?  ?Pulmonary ?asthma ,  ?  ?Pulmonary exam normal ? ? ? ? ? ? ? Cardiovascular ?hypertension, Pt. on medications ?Normal cardiovascular exam ? ?ECG: NSR, rate 88 ?  ?Neuro/Psych ? Headaches, CVA negative psych ROS  ? GI/Hepatic ?GERD  Medicated and Controlled,(+)  ?  ? substance abuse ? ,   ?Endo/Other  ?Hypothyroidism  ? Renal/GU ?Renal disease  ? ?  ?Musculoskeletal ? ?(+) Arthritis , narcotic dependentSPINAL CORD STIMULATOR IMPLANT ?  ? Abdominal ?  ?Peds ? Hematology ?negative hematology ROS ?(+)   ?Anesthesia Other Findings ?Left knee osteoarthritis ? Reproductive/Obstetrics ? ?  ? ? ? ? ? ? ? ? ? ? ? ? ? ?  ?  ? ? ? ? ? ? ?Anesthesia Physical ?Anesthesia Plan ? ?ASA: 2 ? ?Anesthesia Plan: General and Regional  ? ?Post-op Pain Management: Regional block*  ? ?Induction: Intravenous ? ?PONV Risk Score and Plan: 2 and Ondansetron, Dexamethasone and Treatment may vary due to age or medical condition ? ?Airway Management Planned: LMA ? ?Additional Equipment:  ? ?Intra-op Plan:  ? ?Post-operative Plan:  ? ?Informed Consent: I have reviewed the patients History and Physical, chart, labs and discussed the procedure including the risks, benefits and alternatives for the proposed anesthesia with the patient or authorized representative who has indicated his/her understanding and acceptance.  ? ? ? ? ? ?Plan Discussed with: CRNA ? ?Anesthesia Plan Comments: (Ketamine )  ? ? ? ? ? ?Anesthesia Quick Evaluation ? ?

## 2022-01-20 ENCOUNTER — Encounter (HOSPITAL_COMMUNITY): Payer: Self-pay | Admitting: Specialist

## 2022-01-20 DIAGNOSIS — M1712 Unilateral primary osteoarthritis, left knee: Secondary | ICD-10-CM | POA: Diagnosis not present

## 2022-01-20 MED ORDER — METHOCARBAMOL 500 MG PO TABS: 500.0000 mg | ORAL_TABLET | Freq: Four times a day (QID) | ORAL | 0 refills | Status: DC | PRN

## 2022-01-20 MED ORDER — DOXYCYCLINE HYCLATE 50 MG PO CAPS: 50.0000 mg | ORAL_CAPSULE | Freq: Two times a day (BID) | ORAL | 0 refills | Status: DC

## 2022-01-20 MED ORDER — HYDROMORPHONE HCL 2 MG PO TABS: 2.0000 mg | ORAL_TABLET | ORAL | 0 refills | Status: DC | PRN

## 2022-01-20 MED ORDER — ASPIRIN EC 81 MG PO TBEC: 81.0000 mg | DELAYED_RELEASE_TABLET | Freq: Two times a day (BID) | ORAL | 0 refills | Status: AC

## 2022-01-20 MED ORDER — ONDANSETRON HCL 4 MG PO TABS: 4.0000 mg | ORAL_TABLET | Freq: Every day | ORAL | 1 refills | Status: DC | PRN

## 2022-01-20 NOTE — TOC Transition Note (Signed)
Transition of Care (TOC) - CM/SW Discharge Note ? ? ?Patient Details  ?Name: Joel Kim ?MRN: [id number] ?Date of Birth: 1946/03/27 ? ?Transition of Care (TOC) CM/SW Contact:  ?Mitul Hallowell, LCSW ?Phone Number: ?01/20/2022, 10:20 AM ? ? ?Clinical Narrative:    ?Met with pt and wife and confirming pt has all needed DME at home.  OPPT already arrnaged for [facility]).  No TOC needs. ? ? ?Final next level of care: OP Rehab ?Barriers to Discharge: No Barriers Identified ? ? ?Patient Goals and CMS Choice ?Patient states their goals for this hospitalization and ongoing recovery are:: return home ?  ?  ? ?Discharge Placement ?  ?           ?  ?  ?  ?  ? ?Discharge Plan and Services ?  ?  ?           ?DME Arranged: N/A ?DME Agency: NA ?  ?  ?  ?  ?  ?  ?  ?  ? ?Social Determinants of Health (SDOH) Interventions ?  ? ? ?Readmission Risk Interventions ?   ? View : No data to display.  ?  ?  ?  ? ? ? ? ? ?

## 2022-01-20 NOTE — Progress Notes (Signed)
Subjective: ?1 Day Post-Op Procedure(s) (LRB): ?TOTAL KNEE ARTHROPLASTY (Left) ?Patient reports pain as moderate.   ?Patient is having some difficulty with pain management overnight. He did not have a spinal prior to surgery ?Was able to get up with PT yesterday ?No events overnight ? ?Objective: ?Vital signs in last 24 hours: ?Temp:  [97.8 ?F (36.6 ?C)-99 ?F (37.2 ?C)] 97.8 ?F (36.6 ?C) (04/27 0449) ?Pulse Rate:  [79-110] 98 (04/27 0449) ?Resp:  [11-19] 17 (04/27 0449) ?BP: (153-185)/(88-109) 161/93 (04/27 0449) ?SpO2:  [91 %-99 %] 96 % (04/27 0449) ?Weight:  [83.5 kg] 83.5 kg (04/26 0816) ? ?Intake/Output from previous day: ?04/26 0701 - 04/27 0700 ?In: 2960.5 [I.V.:1890.5; IV Piggyback:1070] ?Out: 900 [Urine:700; Blood:200] ?Intake/Output this shift: ?Total I/O ?In: 727.8 [I.V.:527.8; IV Piggyback:200] ?Out: 200 [Urine:200] ? ?No results for input(s): HGB in the last 72 hours. ?No results for input(s): WBC, RBC, HCT, PLT in the last 72 hours. ?No results for input(s): NA, K, CL, CO2, BUN, CREATININE, GLUCOSE, CALCIUM in the last 72 hours. ?No results for input(s): LABPT, INR in the last 72 hours. ? ?Neurologically intact ?Neurovascular intact ?Sensation intact distally ?Intact pulses distally ?Dorsiflexion/Plantar flexion intact ?Incision: dressing C/D/I ?Compartment soft ?Limited ROM of left knee  ? ?Assessment/Plan: ?1 Day Post-Op Procedure(s) (LRB): ?TOTAL KNEE ARTHROPLASTY (Left) ?Advance diet ?Up with therapy WBAT on LLE ?Okay for discharge once cleared by PT ?Meds have been sent to Pharmacy ?Lovenox when in hospital, ASA 81 mg BID for 6 weeks starting tomorrow morning ?Follow up in 2 weeks  ? ? ? ?Patient's anticipated LOS is less than 2 midnights, meeting these requirements: ?- Younger than 54 ?- Lives within 1 hour of care ?- Has a competent adult at home to recover with post-op recover ?- NO history of ? - Chronic pain requiring opiods ? - Diabetes ? - Coronary Artery Disease ? - Heart failure ? - Heart  attack ? - Stroke ? - DVT/VTE ? - Cardiac arrhythmia ? - Respiratory Failure/COPD ? - Renal failure ? - Anemia ? - Advanced Liver disease ? ? ? ? ?Drue Novel, PA-C ?Dr. Theda Sers ?[facility] ?[phone] ?01/20/2022, 6:38 AM ? ?

## 2022-01-20 NOTE — Anesthesia Postprocedure Evaluation (Signed)
Anesthesia Post Note ? ?Patient: [name] ? ?Procedure(s) Performed: TOTAL KNEE ARTHROPLASTY (Left: Knee) ? ?  ? ?Patient location during evaluation: PACU ?Anesthesia Type: Regional and General ?Level of consciousness: awake and alert ?Pain management: pain level controlled ?Vital Signs Assessment: post-procedure vital signs reviewed and stable ?Respiratory status: spontaneous breathing, nonlabored ventilation, respiratory function stable and patient connected to nasal cannula oxygen ?Cardiovascular status: blood pressure returned to baseline and stable ?Postop Assessment: no apparent nausea or vomiting ?Anesthetic complications: no ? ? ?No notable events documented. ? ?Last Vitals:  ?Vitals:  ? 01/20/22 1007 01/20/22 1409  ?BP: (!) 170/80 (!) 160/75  ?Pulse: (!) 56 (!) 58  ?Resp: 16   ?Temp: 36.9 ?C   ?SpO2: 90%   ?  ?Last Pain:  ?Vitals:  ? 01/20/22 1130  ?TempSrc:   ?PainSc: 3   ? ?Pain Goal:   ? ?  ?  ?  ?  ?  ?  ?  ? ?Nary Sneed L Zaheer Wageman ? ? ? ? ?

## 2022-01-20 NOTE — Progress Notes (Signed)
Physical Therapy Treatment ?Patient Details ?Name: Joel Kim ?MRN: [id number] ?DOB: 1945/11/28 ?Today's Date: 01/20/2022 ? ? ?History of Present Illness 76 yo male s/p L TKA  on 01/19/22. PMH: R TKA, L foot surgery, HTN, spondylosis ? ?  ?PT Comments  ? ? POD # 1 pm session ?General Gait Details: had Spouse "hands on" assist pt with amb in hallway.  25% VC's to decrease gait speed and proper walker to self distance to avoid L knee buckle.  Pt just eager to "get it done".  Educated Spouse on safe handling at home. General stair comments: pt was unable to safely navigate stairs using only obe rail due to L knee buckle and weak "other" knee.  Instructed on how to use walker as rails and had Spouse assist to secure.  Performed twice. Pt was unsteady.  Spouse stated she will try to get "someone" alse to help her today. ?Spouse concerned about how she will manage pt to get to and from his OP appointments.  "Last time they came to the house".  Will ask RN about [facility]. ?Pt had met his mobility goals to D/C to home today. Addressed all mobility questions, discussed appropriate activity, educated on use of ICE.  ?  ?Recommendations for follow up therapy are one component of a multi-disciplinary discharge planning process, led by the attending physician.  Recommendations may be updated based on patient status, additional functional criteria and insurance authorization. ? ?Follow Up Recommendations ? Follow physician's recommendations for discharge plan and follow up therapies ?  ?  ?Assistance Recommended at Discharge Intermittent Supervision/Assistance  ?Patient can return home with the following Help with stairs or ramp for entrance;Assist for transportation;Assistance with cooking/housework ?  ?Equipment Recommendations ? None recommended by PT  ?  ?Recommendations for Other Services   ? ? ?  ?Precautions / Restrictions Precautions ?Precautions: Fall;Knee ?Precaution Comments: instructed no pillow under  knee ?Restrictions ?Weight Bearing Restrictions: No ?LLE Weight Bearing: Weight bearing as tolerated  ?  ? ?Mobility ? Bed Mobility ?Overal bed mobility: Needs Assistance ?Bed Mobility: Supine to Sit ?  ?  ?Supine to sit: Supervision, Min guard ?  ?  ?General bed mobility comments: OOB in recliner ?  ? ?Transfers ?Overall transfer level: Needs assistance ?Equipment used: Rolling walker (2 wheels) ?Transfers: Sit to/from Stand ?Sit to Stand: Min guard, Min assist ?  ?  ?  ?  ?  ?General transfer comment: 25% VC's on proper hand placement to push self up vs pull on walker. ?  ? ?Ambulation/Gait ?Ambulation/Gait assistance: Supervision, Min guard ?Gait Distance (Feet): 32 Feet ?Assistive device: Rolling walker (2 wheels) ?Gait Pattern/deviations: Step-to pattern, Decreased stance time - left ?Gait velocity: decreased ?  ?  ?General Gait Details: had Spouse "hands on" assist pt with amb in hallway.  25% VC's to decrease gait speed and proper walker to self distance to avoid L knee buckle.  Pt just eager to "get it done".  Educated Spouse on safe handling at home. ? ? ?Stairs ?Stairs: Yes ?Stairs assistance: Min assist ?Stair Management: No rails, Step to pattern, Forwards, With walker ?Number of Stairs: 4 ?General stair comments: pt was unable to safely navigate stairs using only obe rail due to L knee buckle and weak "other" knee.  Instructed on how to use walker as rails and had Spouse assist to secure.  Performed twice. Pt was unsteady.  Spouse stated she will try to get "someone" alse to help her today. ? ? ?Wheelchair Mobility ?  ? ?  Modified Rankin (Stroke Patients Only) ?  ? ? ?  ?Balance   ?  ?  ?  ?  ?  ?  ?  ?  ?  ?  ?  ?  ?  ?  ?  ?  ?  ?  ?  ? ?  ?Cognition Arousal/Alertness: Awake/alert ?Behavior During Therapy: [facility] for tasks assessed/performed ?Overall Cognitive Status: Within Functional Limits for tasks assessed ?  ?  ?  ?  ?  ?  ?  ?  ?  ?  ?  ?  ?  ?  ?  ?  ?General Comments: AxO x 3 farmer ?  ?  ? ?   ?Exercises   ? ?  ?General Comments   ?  ?  ? ?Pertinent Vitals/Pain Pain Assessment ?Pain Assessment: Faces ?Faces Pain Scale: Hurts little more ?Pain Location: left knee ?Pain Descriptors / Indicators: Aching, Grimacing, Sore, Operative site guarding ?Pain Intervention(s): Monitored during session, Premedicated before session, Repositioned, Ice applied  ? ? ?Home Living   ?  ?  ?  ?  ?  ?  ?  ?  ?  ?   ?  ?Prior Function    ?  ?  ?   ? ?PT Goals (current goals can now be found in the care plan section) Progress towards PT goals: Progressing toward goals ? ?  ?Frequency ? ? ? 7X/week ? ? ? ?  ?PT Plan Current plan remains appropriate  ? ? ?Co-evaluation   ?  ?  ?  ?  ? ?  ?AM-PAC PT "6 Clicks" Mobility   ?Outcome Measure ? Help needed turning from your back to your side while in a flat bed without using bedrails?: A Little ?Help needed moving from lying on your back to sitting on the side of a flat bed without using bedrails?: A Little ?Help needed moving to and from a bed to a chair (including a wheelchair)?: A Little ?Help needed standing up from a chair using your arms (e.g., wheelchair or bedside chair)?: A Little ?Help needed to walk in hospital room?: A Little ?Help needed climbing 3-5 steps with a railing? : A Lot ?6 Click Score: 17 ? ?  ?End of Session Equipment Utilized During Treatment: Gait belt ?Activity Tolerance: Patient tolerated treatment well;Patient limited by fatigue ?Patient left: in chair;with call bell/phone within reach;with chair alarm set;with family/visitor present ?Nurse Communication: Mobility status ?PT Visit Diagnosis: Other abnormalities of gait and mobility (R26.89);Difficulty in walking, not elsewhere classified (R26.2) ?  ? ? ?Time: 3668-1594 ?PT Time Calculation (min) (ACUTE ONLY): 31 min ? ?Charges:  $Gait Training: 8-22 mins ?$Therapeutic Activity: 8-22 mins          ?          ? ?Rica Koyanagi  PTA ?Acute  Rehabilitation Services ?Pager      [phone] ?Office       [phone] ? ?

## 2022-01-20 NOTE — Plan of Care (Signed)
  Problem: Health Behavior/Discharge Planning: Goal: Ability to manage health-related needs will improve Outcome: Progressing   Problem: Activity: Goal: Risk for activity intolerance will decrease Outcome: Progressing   Problem: Pain Managment: Goal: General experience of comfort will improve Outcome: Progressing   Problem: Safety: Goal: Ability to remain free from injury will improve Outcome: Progressing   

## 2022-01-20 NOTE — Discharge Summary (Signed)
Physician Discharge Summary  ?Patient ID: ?Joel Kim ?MRN: [id number] ?DOB/AGE: 76-01-1946 76 y.o. ? ?Admit date: 01/19/2022 ?Discharge date: 01/20/2022 ? ?Admission Diagnoses: ?Left knee Osteoarthritis ? ?Discharge Diagnoses:  ?Principal Problem: ?  Osteoarthritis of left knee ? ? ?Discharged Condition: good ? ?Hospital Course: Patient was admitted on 01/19/2022 for a left TKA due to left knee endstange osteoarthritis. He tolerated surgery well. He was sent to PACU in stable condition. Sent up to postop floor in stable condition. He was able to get up with PT. Patient did have a lot of pain overnight. No events overnight. Postop day 1 patient doing okay, having moderate amount of pain. He was able to get up with PT during the am and was discharged home. Meds sent to the pharmacy for patient. He will follow up in the office in 2 weeks.  ? ?Consults: None ? ?Significant Diagnostic Studies: none ? ?Treatments: IV hydration, antibiotics: vancomycin, analgesia: acetaminophen and Dilaudid, anticoagulation: LMW heparin, therapies: PT, and surgery: left TKA ? ?Discharge Exam: ?Blood pressure (!) 161/93, pulse 98, temperature 97.8 ?F (36.6 ?C), temperature source Oral, resp. rate 17, height '5\' 4"'$  (1.626 m), weight 83.5 kg, SpO2 96 %. ?General appearance: alert, cooperative, appears stated age, and no distress ?Extremities: extremities normal, atraumatic, no cyanosis or edema and Homans sign is negative, no sign of DVT ?Pulses: 2+ and symmetric ?Skin: Skin color, texture, turgor normal. No rashes or lesions ?Neurologic: Grossly normal ?Limited ROM of left knee  ?Incision/Wound: clean dry and intact ? ?Disposition: Discharge disposition: 01-Home or Self Care ? ? ? ? ? ? ?Discharge Instructions   ? ? Call MD / Call 911   Complete by: As directed ?  ? If you experience chest pain or shortness of breath, CALL 911 and be transported to the hospital emergency room.  If you develope a fever above 101 F, pus (white drainage) or  increased drainage or redness at the wound, or calf pain, call your surgeon's office.  ? Constipation Prevention   Complete by: As directed ?  ? Drink plenty of fluids.  Prune juice may be helpful.  You may use a stool softener, such as Colace (over the counter) 100 mg twice a day.  Use MiraLax (over the counter) for constipation as needed.  ? Diet - low sodium heart healthy   Complete by: As directed ?  ? Discharge instructions   Complete by: As directed ?  ? Dr. Sydnee Cabal ?[facility] ?[address] ?[place], [place] [address] ?[phone] ? ?TOTAL KNEE REPLACEMENT POSTOPERATIVE DIRECTIONS ? ?Knee Rehabilitation, Guidelines Following Surgery  ?Results after knee surgery are often greatly improved when you follow the exercise, range of motion and muscle strengthening exercises prescribed by your doctor. Safety measures are also important to protect the knee from further injury. Any time any of these exercises cause you to have increased pain or swelling in your knee joint, decrease the amount until you are comfortable again and slowly increase them. If you have problems or questions, call your caregiver or physical therapist for advice.  ? ?HOME CARE INSTRUCTIONS  ?Remove items at home which could result in a fall. This includes throw rugs or furniture in walking pathways.  ?ICE to the affected knee every three hours for 30 minutes at a time and then as needed for pain and swelling.  Continue to use ice on the knee for pain and swelling from surgery. You may notice swelling that will progress down to the foot  and ankle.  This is normal after surgery.  Elevate the leg when you are not up walking on it.   ?Continue to use the breathing machine which will help keep your temperature down.  It is common for your temperature to cycle up and down following surgery, especially at night when you are not up moving around and exerting yourself.  The breathing machine keeps your lungs expanded and your  temperature down. ?Do not place pillow under knee, focus on keeping the knee straight while resting  ? ?DIET ?You may resume your previous home diet once your are discharged from the hospital. ? ?DRESSING / WOUND CARE / SHOWERING ?Keep the surgical dressing until follow up.  The dressing is water proof, but you need to put extra covering over it like plastic wrap.  IF THE DRESSING FALLS OFF or the wound gets wet inside, change the dressing with sterile gauze.  Please use good hand washing techniques before changing the dressing.  Do not use any lotions or creams on the incision until instructed by your surgeon.   ?You may start showering once you are discharged home but do not submerge the incision under water.  ?You are sent home with an ACE bandage on over the leg, this can be removed at 3 days from surgery. At this time you can start showering. Please place the white TED stocking on the surgical leg after. This needs to be worn on the surgical leg for 2 weeks after surgery.  ? ?ACTIVITY ?Walk with your walker as instructed. ?Use walker as long as suggested by your caregivers. ?Avoid periods of inactivity such as sitting longer than an hour when not asleep. This helps prevent blood clots.  ?You may resume a sexual relationship in one month or when given the OK by your doctor.  ?You may return to work once you are cleared by your doctor.  ?Do not drive a car for 6 weeks or until released by you surgeon.  ?Do not drive while taking narcotics. ? ?WEIGHT BEARING ?Weight bearing as tolerated with assist device (walker, cane, etc) as directed, use it as long as suggested by your surgeon or therapist, typically at least 4-6 weeks. ? ?POSTOPERATIVE CONSTIPATION PROTOCOL ?Constipation - defined medically as fewer than three stools per week and severe constipation as less than one stool per week. ? ?One of the most common issues patients have following surgery is constipation.  Even if you have a regular bowel pattern at  home, your normal regimen is likely to be disrupted due to multiple reasons following surgery.  Combination of anesthesia, postoperative narcotics, change in appetite and fluid intake all can affect your bowels.  In order to avoid complications following surgery, here are some recommendations in order to help you during your recovery period. ? ?Colace (docusate) - Pick up an over-the-counter form of Colace or another stool softener and take twice a day as long as you are requiring postoperative pain medications.  Take with a full glass of water daily.  If you experience loose stools or diarrhea, hold the colace until you stool forms back up.  If your symptoms do not get better within 1 week or if they get worse, check with your doctor. ? ?Dulcolax (bisacodyl) - Pick up over-the-counter and take as directed by the product packaging as needed to assist with the movement of your bowels.  Take with a full glass of water.  Use this product as needed if not relieved by Colace only.  ? ?  MiraLax (polyethylene glycol) - Pick up over-the-counter to have on hand.  MiraLax is a solution that will increase the amount of water in your bowels to assist with bowel movements.  Take as directed and can mix with a glass of water, juice, soda, coffee, or tea.  Take if you go more than two days without a movement. ?Do not use MiraLax more than once per day. Call your doctor if you are still constipated or irregular after using this medication for 7 days in a row. ? ?If you continue to have problems with postoperative constipation, please contact the office for further assistance and recommendations.  If you experience "the worst abdominal pain ever" or develop nausea or vomiting, please contact the office immediatly for further recommendations for treatment. ? ?ITCHING ? If you experience itching with your medications, try taking only a single pain pill, or even half a pain pill at a time.  You can also use Benadryl over the counter for  itching or also to help with sleep.  ? ?TED HOSE STOCKINGS ?Wear the elastic stockings on both legs for two weeks following surgery during the day but you may remove then at night for sleeping. ? ?Okay to rem

## 2022-01-20 NOTE — Plan of Care (Signed)
Discharge instructions given to the pt. And his wife including medications and follow up.  ?

## 2022-01-20 NOTE — Progress Notes (Signed)
Physical Therapy Treatment ?Patient Details ?Name: Joel Kim ?MRN: [id number] ?DOB: [birth year] ?Today's Date: 01/20/2022 ? ? ?History of Present Illness 76 yo male s/p L TKA  on 01/19/22. PMH: R TKA, L foot surgery, HTN, spondylosis ? ?  ?PT Comments  ? ? POD # 1 am session ?General Comments: AxO x 3 farmer.  Spouse present during session.  General bed mobility comments: demonsatred and instructed how to use belt to self assist L LE off bed which pt was able to complete with increased time. General transfer comment: 50% VC's on proper hand placement and safety with turns as pt tends to pull self up on walker and "park" walker to side as he reaches too far for chair.  Required Redirection. General Gait Details: assisted with amb a limited distance in hallway with 50% VC's on proper walker to self distance and 75% VC's safety with turns.  Slightly unsteady and too quick to step before "locking" his L knee.  Staggering and continues to "get it done".  Required Redirection to "slow down" and "lock" his L knee when stepping with Right. Positioned in recliner with emphasis keeping L LE straight and elevated due to foot swelling.  Performed a few TE's followed by ICE. ?Pt will need another session to address stairs. ?  ?Recommendations for follow up therapy are one component of a multi-disciplinary discharge planning process, led by the attending physician.  Recommendations may be updated based on patient status, additional functional criteria and insurance authorization. ? ?Follow Up Recommendations ? Follow physician's recommendations for discharge plan and follow up therapies ?  ?  ?Assistance Recommended at Discharge Intermittent Supervision/Assistance  ?Patient can return home with the following Help with stairs or ramp for entrance;Assist for transportation;Assistance with cooking/housework ?  ?Equipment Recommendations ? None recommended by PT  ?  ?Recommendations for Other Services   ? ? ?  ?Precautions /  Restrictions Precautions ?Precautions: Fall;Knee ?Precaution Comments: instructed no pillow under knee ?Restrictions ?Weight Bearing Restrictions: No ?LLE Weight Bearing: Weight bearing as tolerated  ?  ? ?Mobility ? Bed Mobility ?Overal bed mobility: Needs Assistance ?Bed Mobility: Supine to Sit ?  ?  ?Supine to sit: Supervision, Min guard ?  ?  ?General bed mobility comments: demonsatred and instructed how to use belt to self assist L LE off bed which pt was able to complete with increased time. ?  ? ?Transfers ?Overall transfer level: Needs assistance ?Equipment used: Rolling walker (2 wheels) ?Transfers: Sit to/from Stand ?Sit to Stand: Min guard, Min assist ?  ?  ?  ?  ?  ?General transfer comment: 50% VC's on proper hand placement and safety with turns as pt tends to pull self up on walker and "park" walker to side as he reaches too far for chair.  Required Redirection. ?  ? ?Ambulation/Gait ?Ambulation/Gait assistance: Min assist ?Gait Distance (Feet): 28 Feet ?Assistive device: Rolling walker (2 wheels) ?Gait Pattern/deviations: Step-to pattern, Decreased stance time - left ?Gait velocity: decreased ?  ?  ?General Gait Details: assisted with amb a limited distance in hallway with 50% VC's on proper walker to self distance and 75% VC's safety with turns.  Slightly unsteady and too quick to step before "locking" his L knee.  Staggering and continues to "get it done".  Required Redirection to "slow down" and "lock" his L knee when stepping with Right. ? ? ?Stairs ?  ?  ?  ?  ?  ? ? ?Wheelchair Mobility ?  ? ?Modified Rankin (  Stroke Patients Only) ?  ? ? ?  ?Balance   ?  ?  ?  ?  ?  ?  ?  ?  ?  ?  ?  ?  ?  ?  ?  ?  ?  ?  ?  ? ?  ?Cognition Arousal/Alertness: Awake/alert ?Behavior During Therapy: [facility] for tasks assessed/performed ?Overall Cognitive Status: Within Functional Limits for tasks assessed ?  ?  ?  ?  ?  ?  ?  ?  ?  ?  ?  ?  ?  ?  ?  ?  ?General Comments: AxO x 3 farmer ?  ?  ? ?  ?Exercises  10 reps AP,  knee presses ? ?  ?General Comments   ?  ?  ? ?Pertinent Vitals/Pain Pain Assessment ?Pain Assessment: Faces ?Faces Pain Scale: Hurts little more ?Pain Location: left knee ?Pain Descriptors / Indicators: Aching, Grimacing, Sore, Operative site guarding ?Pain Intervention(s): Monitored during session, Premedicated before session, Repositioned, Ice applied  ? ? ?Home Living   ?  ?  ?  ?  ?  ?  ?  ?  ?  ?   ?  ?Prior Function    ?  ?  ?   ? ?PT Goals (current goals can now be found in the care plan section) Progress towards PT goals: Progressing toward goals ? ?  ?Frequency ? ? ? 7X/week ? ? ? ?  ?PT Plan Current plan remains appropriate  ? ? ?Co-evaluation   ?  ?  ?  ?  ? ?  ?AM-PAC PT "6 Clicks" Mobility   ?Outcome Measure ? Help needed turning from your back to your side while in a flat bed without using bedrails?: A Little ?Help needed moving from lying on your back to sitting on the side of a flat bed without using bedrails?: A Little ?Help needed moving to and from a bed to a chair (including a wheelchair)?: A Little ?Help needed standing up from a chair using your arms (e.g., wheelchair or bedside chair)?: A Little ?Help needed to walk in hospital room?: A Little ?Help needed climbing 3-5 steps with a railing? : A Lot ?6 Click Score: 17 ? ?  ?End of Session Equipment Utilized During Treatment: Gait belt ?Activity Tolerance: Patient tolerated treatment well;Patient limited by fatigue ?Patient left: in chair;with call bell/phone within reach;with chair alarm set;with family/visitor present ?Nurse Communication: Mobility status ?PT Visit Diagnosis: Other abnormalities of gait and mobility (R26.89);Difficulty in walking, not elsewhere classified (R26.2) ?  ? ? ?Time: 1040-1106 ?PT Time Calculation (min) (ACUTE ONLY): 26 min ? ?Charges:  $Gait Training: 8-22 mins ?$Therapeutic Activity: 8-22 mins          ?          ? ?{Sharina Petre  PTA ?Acute  Rehabilitation Services ?Pager      [phone] ?Office       [phone] ? ?

## 2022-01-24 ENCOUNTER — Ambulatory Visit: Payer: Medicare HMO | Admitting: Physical Therapy

## 2022-01-25 ENCOUNTER — Encounter (HOSPITAL_COMMUNITY): Payer: Self-pay | Admitting: Emergency Medicine

## 2022-01-25 ENCOUNTER — Other Ambulatory Visit: Payer: Self-pay | Admitting: Ophthalmology

## 2022-01-25 ENCOUNTER — Emergency Department (HOSPITAL_COMMUNITY): Payer: Medicare HMO

## 2022-01-25 ENCOUNTER — Other Ambulatory Visit (HOSPITAL_COMMUNITY): Payer: Self-pay | Admitting: Ophthalmology

## 2022-01-25 ENCOUNTER — Other Ambulatory Visit: Payer: Self-pay

## 2022-01-25 ENCOUNTER — Inpatient Hospital Stay (HOSPITAL_COMMUNITY)
Admission: EM | Admit: 2022-01-25 | Discharge: 2022-01-31 | DRG: 919 | Disposition: A | Discharge status: Skilled Nursing Facility | Payer: Medicare HMO | Attending: Internal Medicine | Admitting: Internal Medicine

## 2022-01-25 ENCOUNTER — Ambulatory Visit (HOSPITAL_COMMUNITY)
Admission: RE | Admit: 2022-01-25 | Discharge: 2022-01-25 | Disposition: A | Discharge status: Home / Self Care | Payer: Medicare HMO | Source: Ambulatory Visit | Attending: Ophthalmology | Admitting: Ophthalmology

## 2022-01-25 DIAGNOSIS — K219 Gastro-esophageal reflux disease without esophagitis: Secondary | ICD-10-CM | POA: Diagnosis not present

## 2022-01-25 DIAGNOSIS — R7303 Prediabetes: Secondary | ICD-10-CM | POA: Diagnosis present

## 2022-01-25 DIAGNOSIS — G894 Chronic pain syndrome: Secondary | ICD-10-CM | POA: Diagnosis not present

## 2022-01-25 DIAGNOSIS — E039 Hypothyroidism, unspecified: Secondary | ICD-10-CM | POA: Diagnosis present

## 2022-01-25 DIAGNOSIS — G43909 Migraine, unspecified, not intractable, without status migrainosus: Secondary | ICD-10-CM | POA: Diagnosis present

## 2022-01-25 DIAGNOSIS — M79605 Pain in left leg: Secondary | ICD-10-CM

## 2022-01-25 DIAGNOSIS — D649 Anemia, unspecified: Secondary | ICD-10-CM

## 2022-01-25 DIAGNOSIS — T8189XA Other complications of procedures, not elsewhere classified, initial encounter: Principal | ICD-10-CM | POA: Diagnosis present

## 2022-01-25 DIAGNOSIS — Z8673 Personal history of transient ischemic attack (TIA), and cerebral infarction without residual deficits: Secondary | ICD-10-CM | POA: Diagnosis not present

## 2022-01-25 DIAGNOSIS — Z8546 Personal history of malignant neoplasm of prostate: Secondary | ICD-10-CM

## 2022-01-25 DIAGNOSIS — Y838 Other surgical procedures as the cause of abnormal reaction of the patient, or of later complication, without mention of misadventure at the time of the procedure: Secondary | ICD-10-CM | POA: Diagnosis not present

## 2022-01-25 DIAGNOSIS — Z91040 Latex allergy status: Secondary | ICD-10-CM

## 2022-01-25 DIAGNOSIS — I10 Essential (primary) hypertension: Secondary | ICD-10-CM | POA: Diagnosis present

## 2022-01-25 DIAGNOSIS — D62 Acute posthemorrhagic anemia: Secondary | ICD-10-CM | POA: Diagnosis present

## 2022-01-25 DIAGNOSIS — Z96653 Presence of artificial knee joint, bilateral: Secondary | ICD-10-CM | POA: Diagnosis present

## 2022-01-25 DIAGNOSIS — Z9682 Presence of neurostimulator: Secondary | ICD-10-CM

## 2022-01-25 DIAGNOSIS — E876 Hypokalemia: Secondary | ICD-10-CM | POA: Diagnosis not present

## 2022-01-25 DIAGNOSIS — G4733 Obstructive sleep apnea (adult) (pediatric): Secondary | ICD-10-CM | POA: Diagnosis present

## 2022-01-25 DIAGNOSIS — Z8249 Family history of ischemic heart disease and other diseases of the circulatory system: Secondary | ICD-10-CM

## 2022-01-25 DIAGNOSIS — M7989 Other specified soft tissue disorders: Secondary | ICD-10-CM | POA: Insufficient documentation

## 2022-01-25 DIAGNOSIS — Z85820 Personal history of malignant melanoma of skin: Secondary | ICD-10-CM

## 2022-01-25 DIAGNOSIS — I82402 Acute embolism and thrombosis of unspecified deep veins of left lower extremity: Secondary | ICD-10-CM | POA: Diagnosis present

## 2022-01-25 DIAGNOSIS — I82412 Acute embolism and thrombosis of left femoral vein: Principal | ICD-10-CM

## 2022-01-25 DIAGNOSIS — Z888 Allergy status to other drugs, medicaments and biological substances status: Secondary | ICD-10-CM

## 2022-01-25 DIAGNOSIS — Z96652 Presence of left artificial knee joint: Secondary | ICD-10-CM

## 2022-01-25 DIAGNOSIS — R21 Rash and other nonspecific skin eruption: Secondary | ICD-10-CM | POA: Diagnosis not present

## 2022-01-25 DIAGNOSIS — J45909 Unspecified asthma, uncomplicated: Secondary | ICD-10-CM | POA: Diagnosis present

## 2022-01-25 DIAGNOSIS — I82452 Acute embolism and thrombosis of left peroneal vein: Secondary | ICD-10-CM | POA: Diagnosis not present

## 2022-01-25 DIAGNOSIS — G4700 Insomnia, unspecified: Secondary | ICD-10-CM | POA: Diagnosis not present

## 2022-01-25 DIAGNOSIS — G9341 Metabolic encephalopathy: Secondary | ICD-10-CM | POA: Diagnosis not present

## 2022-01-25 DIAGNOSIS — Y929 Unspecified place or not applicable: Secondary | ICD-10-CM | POA: Diagnosis not present

## 2022-01-25 DIAGNOSIS — Z79890 Hormone replacement therapy (postmenopausal): Secondary | ICD-10-CM

## 2022-01-25 DIAGNOSIS — E785 Hyperlipidemia, unspecified: Secondary | ICD-10-CM | POA: Diagnosis present

## 2022-01-25 DIAGNOSIS — G8918 Other acute postprocedural pain: Secondary | ICD-10-CM

## 2022-01-25 DIAGNOSIS — Z79899 Other long term (current) drug therapy: Secondary | ICD-10-CM

## 2022-01-25 LAB — URINALYSIS, ROUTINE W REFLEX MICROSCOPIC
Bacteria, UA: NONE SEEN
Bilirubin Urine: NEGATIVE
Glucose, UA: NEGATIVE mg/dL
Ketones, ur: NEGATIVE mg/dL
Leukocytes,Ua: NEGATIVE
Nitrite: NEGATIVE
Protein, ur: >=300 mg/dL — AB
Specific Gravity, Urine: 1.019 (ref 1.005–1.030)
pH: 6 (ref 5.0–8.0)

## 2022-01-25 LAB — CBC WITH DIFFERENTIAL/PLATELET
Abs Immature Granulocytes: 0.28 10*3/uL — ABNORMAL HIGH (ref 0.00–0.07)
Basophils Absolute: 0.1 10*3/uL (ref 0.0–0.1)
Basophils Relative: 1 %
Eosinophils Absolute: 0.5 10*3/uL (ref 0.0–0.5)
Eosinophils Relative: 2 %
HCT: 30.7 % — ABNORMAL LOW (ref 39.0–52.0)
Hemoglobin: 10.7 g/dL — ABNORMAL LOW (ref 13.0–17.0)
Immature Granulocytes: 1 %
Lymphocytes Relative: 17 %
Lymphs Abs: 3.4 10*3/uL (ref 0.7–4.0)
MCH: 32.5 pg (ref 26.0–34.0)
MCHC: 34.9 g/dL (ref 30.0–36.0)
MCV: 93.3 fL (ref 80.0–100.0)
Monocytes Absolute: 1.5 10*3/uL — ABNORMAL HIGH (ref 0.1–1.0)
Monocytes Relative: 7 %
Neutro Abs: 14.2 10*3/uL — ABNORMAL HIGH (ref 1.7–7.7)
Neutrophils Relative %: 72 %
Platelets: 561 10*3/uL — ABNORMAL HIGH (ref 150–400)
RBC: 3.29 MIL/uL — ABNORMAL LOW (ref 4.22–5.81)
RDW: 14.9 % (ref 11.5–15.5)
WBC: 20 10*3/uL — ABNORMAL HIGH (ref 4.0–10.5)
nRBC: 0.2 % (ref 0.0–0.2)

## 2022-01-25 LAB — BASIC METABOLIC PANEL
Anion gap: 11 (ref 5–15)
BUN: 23 mg/dL (ref 8–23)
CO2: 31 mmol/L (ref 22–32)
Calcium: 8.8 mg/dL — ABNORMAL LOW (ref 8.9–10.3)
Chloride: 92 mmol/L — ABNORMAL LOW (ref 98–111)
Creatinine, Ser: 1.08 mg/dL (ref 0.61–1.24)
GFR, Estimated: >60 mL/min (ref >60)
Glucose, Bld: 169 mg/dL — ABNORMAL HIGH (ref 70–99)
Potassium: 2.5 mmol/L — CL (ref 3.5–5.1)
Sodium: 134 mmol/L — ABNORMAL LOW (ref 135–145)

## 2022-01-25 LAB — MAGNESIUM: Magnesium: 2.1 mg/dL (ref 1.7–2.4)

## 2022-01-25 MED ORDER — ACETAMINOPHEN 650 MG RE SUPP: 650.0000 mg | Freq: Four times a day (QID) | RECTAL | Status: DC | PRN

## 2022-01-25 MED ORDER — DOXYCYCLINE HYCLATE 50 MG PO CAPS
50.0000 mg | ORAL_CAPSULE | Freq: Two times a day (BID) | ORAL | Status: DC
Start: 2022-01-25 — End: 2022-01-25
  Filled 2022-01-25: qty 1

## 2022-01-25 MED ORDER — PANTOPRAZOLE SODIUM 40 MG PO TBEC
40.0000 mg | DELAYED_RELEASE_TABLET | Freq: Every day | ORAL | Status: DC
  Administered 2022-01-26 – 2022-01-31 (×6): 40 mg via ORAL
  Filled 2022-01-25 (×6): qty 1

## 2022-01-25 MED ORDER — HEPARIN SODIUM (PORCINE) 5000 UNIT/ML IJ SOLN
5000.0000 [IU] | Freq: Three times a day (TID) | INTRAMUSCULAR | Status: DC
  Administered 2022-01-26: 5000 [IU] via SUBCUTANEOUS
  Filled 2022-01-25: qty 1

## 2022-01-25 MED ORDER — POTASSIUM CHLORIDE CRYS ER 20 MEQ PO TBCR
40.0000 meq | EXTENDED_RELEASE_TABLET | Freq: Once | ORAL | Status: AC
  Administered 2022-01-25: 40 meq via ORAL
  Filled 2022-01-25: qty 2

## 2022-01-25 MED ORDER — ONDANSETRON HCL 4 MG PO TABS
4.0000 mg | ORAL_TABLET | Freq: Four times a day (QID) | ORAL | Status: DC | PRN
  Administered 2022-01-26: 4 mg via ORAL
  Filled 2022-01-25 (×2): qty 1

## 2022-01-25 MED ORDER — ACETAMINOPHEN 325 MG PO TABS
650.0000 mg | ORAL_TABLET | Freq: Four times a day (QID) | ORAL | Status: DC | PRN
  Administered 2022-01-31: 650 mg via ORAL
  Filled 2022-01-25: qty 2

## 2022-01-25 MED ORDER — AMLODIPINE BESYLATE 5 MG PO TABS
5.0000 mg | ORAL_TABLET | Freq: Every day | ORAL | Status: DC
  Administered 2022-01-26 – 2022-01-31 (×6): 5 mg via ORAL
  Filled 2022-01-25 (×6): qty 1

## 2022-01-25 MED ORDER — HEPARIN SODIUM (PORCINE) 5000 UNIT/ML IJ SOLN: 5000.0000 [IU] | Freq: Three times a day (TID) | INTRAMUSCULAR | Status: DC

## 2022-01-25 MED ORDER — POTASSIUM CHLORIDE 10 MEQ/100ML IV SOLN: 10.0000 meq | Freq: Once | INTRAVENOUS | Status: DC

## 2022-01-25 MED ORDER — APIXABAN 5 MG PO TABS
10.0000 mg | ORAL_TABLET | Freq: Two times a day (BID) | ORAL | Status: DC
  Administered 2022-01-25: 10 mg via ORAL
  Filled 2022-01-25: qty 2

## 2022-01-25 MED ORDER — ONDANSETRON HCL 4 MG/2ML IJ SOLN
4.0000 mg | Freq: Four times a day (QID) | INTRAMUSCULAR | Status: DC | PRN
Start: 2022-01-25 — End: 2022-01-31

## 2022-01-25 MED ORDER — POTASSIUM CHLORIDE CRYS ER 20 MEQ PO TBCR
40.0000 meq | EXTENDED_RELEASE_TABLET | ORAL | Status: AC
  Administered 2022-01-25 – 2022-01-26 (×2): 40 meq via ORAL
  Filled 2022-01-25 (×2): qty 2

## 2022-01-25 MED ORDER — POLYETHYLENE GLYCOL 3350 17 G PO PACK: 17.0000 g | PACK | Freq: Every day | ORAL | Status: DC | PRN

## 2022-01-25 MED ORDER — HYDROMORPHONE HCL 1 MG/ML IJ SOLN: 1.0000 mg | Freq: Once | INTRAMUSCULAR | Status: DC

## 2022-01-25 MED ORDER — ONDANSETRON HCL 4 MG/2ML IJ SOLN
4.0000 mg | Freq: Once | INTRAMUSCULAR | Status: AC
  Administered 2022-01-25: 4 mg via INTRAVENOUS
  Filled 2022-01-25: qty 2

## 2022-01-25 MED ORDER — APIXABAN 5 MG PO TABS: 5.0000 mg | ORAL_TABLET | Freq: Two times a day (BID) | ORAL | Status: DC

## 2022-01-25 MED ORDER — HYDROMORPHONE HCL 1 MG/ML IJ SOLN
0.5000 mg | INTRAMUSCULAR | Status: DC | PRN
  Administered 2022-01-25 – 2022-01-26 (×5): 0.5 mg via INTRAVENOUS
  Filled 2022-01-25 (×5): qty 0.5

## 2022-01-25 MED ORDER — POTASSIUM CHLORIDE 10 MEQ/100ML IV SOLN
10.0000 meq | INTRAVENOUS | Status: AC
  Administered 2022-01-25: 10 meq via INTRAVENOUS
  Filled 2022-01-25: qty 100

## 2022-01-25 MED ORDER — HYDROCHLOROTHIAZIDE 25 MG PO TABS
25.0000 mg | ORAL_TABLET | Freq: Every day | ORAL | Status: DC
  Administered 2022-01-26 – 2022-01-31 (×6): 25 mg via ORAL
  Filled 2022-01-25 (×7): qty 1

## 2022-01-25 MED ORDER — DOXYCYCLINE HYCLATE 100 MG PO TABS
50.0000 mg | ORAL_TABLET | Freq: Two times a day (BID) | ORAL | Status: AC
  Administered 2022-01-25 – 2022-01-26 (×2): 50 mg via ORAL
  Filled 2022-01-25 (×3): qty 0.5

## 2022-01-25 MED ORDER — LEVOTHYROXINE SODIUM 100 MCG PO TABS
100.0000 ug | ORAL_TABLET | Freq: Every day | ORAL | Status: DC
  Administered 2022-01-26 – 2022-01-31 (×6): 100 ug via ORAL
  Filled 2022-01-25 (×6): qty 1

## 2022-01-25 MED ORDER — SODIUM CHLORIDE 0.9 % IV BOLUS
500.0000 mL | Freq: Once | INTRAVENOUS | Status: AC
Start: 2022-01-25 — End: 2022-01-25
  Administered 2022-01-25: 500 mL via INTRAVENOUS

## 2022-01-25 NOTE — Progress Notes (Signed)
Delay in accepting pt due to waiting on updated set of VS.  ?

## 2022-01-25 NOTE — Assessment & Plan Note (Addendum)
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  Postsurgery was placed on 81 mg of aspirin twice daily.  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  No extension of distal DVT to more proximal venous system. ?- EDP talked to orthopedics PA, recommended Eliquis or Xarelto, Eliquis started in ED. ?- Hold further dose for now pending Ortho eval, CBC in a.m, and monitor area of ecchymosis left thigh to ensure no expansion. ?- IV heparin for now ?

## 2022-01-25 NOTE — Assessment & Plan Note (Signed)
Blood pressure stable. ?-Resume HCTZ, Norvasc in a.m. ? ?

## 2022-01-25 NOTE — Assessment & Plan Note (Addendum)
Recent left TKA by Dr. Sydnee Cabal 4/26.  Today patient's whole left lower extremity appears swollen, with pain and tenderness, which reflects his left knee, with new area of ecchymosis to left upper thigh.  He has a leukocytosis of 20 which may be postsurgical, versus stress , need to rule out infection. He was placed on doxycycline 4/27 which he will be completing today.  ?-Will admit to [facility] for Ortho evaluation ?- I talked to PA on call- Merla Riches) for [facility], patient will be seen tomorrow. ?

## 2022-01-25 NOTE — Assessment & Plan Note (Signed)
Potassium 2.5, likely from HCTZ. ?-Check magnesium- 2.1 ?- replete K ? ?

## 2022-01-25 NOTE — ED Provider Notes (Signed)
?[place] ?Provider Note ? ? ?CSN: [id number] ?Arrival date & time: 01/25/22  1222 ? ?  ? ?History ? ?Chief Complaint  ?Patient presents with  ? Abnormal Lab  ? ? ?Joel Kim is a 76 y.o. male with a past medical history of hypertension, hypothyroidism, hyperlipidemia who underwent left knee arthroplasty to the care of Dr. Theda Sers 6 days ago presenting for evaluation of increasing pain and swelling of the entire left leg in association with increasing generalized weakness.  He was sent for an outpatient ultrasound of the leg this morning which revealed a DVT in the left peroneal vein.  Wife at the bedside states he has been unable to weight bear secondary to pain in the leg.  He has also had increased drowsiness although also states he is currently taking Dilaudid 2 mg tablets but had to bump this up to 4 mg every 6 hours for adequate pain relief.  He has had no documented fevers or chills.  He also denies chest pain, pleuritic pain or shortness of breath.  He states he gets completely "wiped out" with just trying to transfer from a wheelchair to a chair.  His wife states that she cannot take care of him in his current state of weakness and pain.  He denies cough, shortness of breath, chest pain, also denies dysuria, has no abdominal pain, nausea or vomiting.  He has been tolerating p.o.'s but appetite has been reduced. ? ?The history is provided by the patient.  ? ?  ? ?Home Medications ?Prior to Admission medications   ?Medication Sig Start Date End Date Taking? Authorizing Provider  ?albuterol (PROVENTIL HFA;VENTOLIN HFA) 108 (90 Base) MCG/ACT inhaler Inhale 2 puffs into the lungs every 6 (six) hours as needed. ?Patient taking differently: Inhale 2 puffs into the lungs every 6 (six) hours as needed for wheezing or shortness of breath. 05/13/18   Rancour, Annie Main, MD  ?amLODipine (NORVASC) 5 MG tablet Take 5 mg by mouth at bedtime.    [provider]  ?Ascorbic Acid (VITAMIN C)  1000 MG tablet Take 1,000 mg by mouth daily.    [provider]  ?aspirin EC 81 MG tablet Take 1 tablet (81 mg total) by mouth 2 (two) times daily. Swallow whole. 01/20/22 03/03/22  Drue Novel, PA  ?cetirizine (ZYRTEC) 10 MG tablet Take 10 mg by mouth at bedtime.     [provider]  ?Cholecalciferol (VITAMIN D3) 50 MCG (2000 UT) TABS Take 2,000 Units by mouth daily.    [provider]  ?doxycycline (VIBRAMYCIN) 50 MG capsule Take 1 capsule (50 mg total) by mouth 2 (two) times daily for 5 days. 01/20/22 01/25/22  Drue Novel, PA  ?Evolocumab (REPATHA) 140 MG/ML SOSY Inject 140 mg into the skin every 14 (fourteen) days.    [provider]  ?Fluorouracil (TOLAK) 4 % CREA Apply to affected area Monday-Sunday qhs x 2 weeks ?Patient not taking: Reported on 12/22/2021 06/16/21   Robyne Askew R, PA-C  ?hydrochlorothiazide (HYDRODIURIL) 25 MG tablet Take 25 mg by mouth daily.    [provider]  ?HYDROmorphone (DILAUDID) 2 MG tablet Take 1 tablet (2 mg total) by mouth every 4 (four) hours as needed for up to 7 days for severe pain. 01/20/22 01/27/22  Drue Novel, PA  ?levothyroxine (SYNTHROID) 100 MCG tablet Take 100 mcg by mouth daily before breakfast. 12/06/18   [provider]  ?methocarbamol (ROBAXIN) 500 MG tablet Take 1 tablet (500 mg total)  by mouth every 6 (six) hours as needed for muscle spasms. 01/20/22   Drue Novel, PA  ?Multiple Vitamins-Minerals (MULTIVITAMIN WITH MINERALS) tablet Take 1 tablet by mouth daily.    [provider]  ?ondansetron (ZOFRAN) 4 MG tablet Take 1 tablet (4 mg total) by mouth daily as needed for nausea or vomiting. 01/20/22 01/20/23  Drue Novel, PA  ?Oxycodone HCl 10 MG TABS Take 10 mg by mouth 4 (four) times daily.    [provider]  ?pantoprazole (PROTONIX) 40 MG tablet Take 1 tablet (40 mg total) by mouth daily. 01/21/13   Chipper Herb, MD  ?Probiotic Product (PROBIOTIC PO) Take by mouth.     [provider]  ?SUMAtriptan (IMITREX) 50 MG tablet Take 1 tablet (50 mg total) by mouth as needed. Every 2 hrs as needed 06/17/19   Garvin Fila, MD  ?traZODone (DESYREL) 50 MG tablet Take 75 mg by mouth at bedtime. 12/26/19   [provider]  ?triamcinolone cream (KENALOG) 0.1 % Apply 1 application topically daily. ?Patient taking differently: Apply 1 application. topically daily as needed (Rash). 10/19/21   Warren Danes, PA-C  ?valACYclovir (VALTREX) 1000 MG tablet Take 1,000 mg by mouth daily as needed (Cold sores). 09/17/18   [provider]  ?   ? ?Allergies    ?Tapentadol; Paroxetine hcl; Statins; Adhesive [tape]; Ciprofloxacin; Mannitol; Reclast [zoledronic acid]; Water for injection [water, sterile]; Flagyl [metronidazole]; and Latex   ? ?Review of Systems   ?Review of Systems  ?Constitutional:  Positive for fatigue. Negative for fever.  ?HENT:  Negative for congestion and sore throat.   ?Eyes: Negative.   ?Respiratory:  Negative for chest tightness and shortness of breath.   ?Cardiovascular:  Negative for chest pain.  ?Gastrointestinal:  Negative for abdominal pain and nausea.  ?Genitourinary: Negative.   ?Musculoskeletal:  Positive for arthralgias and joint swelling. Negative for neck pain.  ?Skin:  Positive for color change. Negative for rash and wound.  ?Neurological:  Negative for dizziness, weakness, light-headedness, numbness and headaches.  ?Psychiatric/Behavioral: Negative.    ? ?Physical Exam ?Updated Vital Signs ?BP 133/90 (BP Location: Right Arm)   Pulse 96   Temp 98.4 ?F (36.9 ?C) (Oral)   Resp 20   Ht '5\' 4"'$  (1.626 m)   Wt 83.9 kg   SpO2 96%   BMI 31.76 kg/m?  ?Physical Exam ?Vitals and nursing note reviewed.  ?Constitutional:   ?   General: He is not in acute distress. ?   Appearance: He is well-developed.  ?   Comments: Drowsy but responds appropriately to questions.  ?HENT:  ?   Head: Normocephalic and atraumatic.  ?Eyes:  ?   Conjunctiva/sclera:  Conjunctivae normal.  ?Cardiovascular:  ?   Rate and Rhythm: Normal rate and regular rhythm.  ?   Heart sounds: Normal heart sounds.  ?Pulmonary:  ?   Effort: Pulmonary effort is normal.  ?   Breath sounds: Normal breath sounds. No wheezing.  ?Abdominal:  ?   General: Bowel sounds are normal.  ?   Palpations: Abdomen is soft.  ?   Tenderness: There is no abdominal tenderness.  ?Musculoskeletal:     ?   General: Swelling present. Normal range of motion.  ?   Cervical back: Normal range of motion.  ?   Comments: Tense left lower extremity edema.  There are scattered intact bulla on his calf and tibia region.  Dorsalis pedis pulses present.  Surgical site with dressing in  place,  small amount of dried blood at lower border.  Intact.  Dressing not removed yet as pt is being evaluated in a public triage area due to no bed availability.    ?Skin: ?   General: Skin is warm and dry.  ?   Findings: Bruising and lesion present.  ?   Comments: Large area of bruising left upper medial thigh (new since ytd per wife).   ?Neurological:  ?   Mental Status: He is alert.  ? ? ?ED Results / Procedures / Treatments   ?Labs ?(all labs ordered are listed, but only abnormal results are displayed) ?Labs Reviewed  ?BASIC METABOLIC PANEL - Abnormal; Notable for the following components:  ?    Result Value  ? Sodium 134 (*)   ? Potassium 2.5 (*)   ? Chloride 92 (*)   ? Glucose, Bld 169 (*)   ? Calcium 8.8 (*)   ? All other components within normal limits  ?CBC WITH DIFFERENTIAL/PLATELET - Abnormal; Notable for the following components:  ? WBC 20.0 (*)   ? RBC 3.29 (*)   ? Hemoglobin 10.7 (*)   ? HCT 30.7 (*)   ? Platelets 561 (*)   ? Neutro Abs 14.2 (*)   ? Monocytes Absolute 1.5 (*)   ? Abs Immature Granulocytes 0.28 (*)   ? All other components within normal limits  ?URINALYSIS, ROUTINE W REFLEX MICROSCOPIC  ?MAGNESIUM  ? ? ?EKG ?None ? ?Radiology ?DG Chest 1 View ? ?Result Date: 01/25/2022 ?CLINICAL DATA:  post op weakness EXAM: CHEST  1  VIEW COMPARISON:  01/29/2021 FINDINGS: Cardiomediastinal silhouette and pulmonary vasculature are within normal limits. Lungs are clear. Neurostimulator leads again noted in the midthoracic spine. IMPRESSION: No acute cardiopu

## 2022-01-25 NOTE — Assessment & Plan Note (Signed)
Resume Synthroid. 

## 2022-01-25 NOTE — Assessment & Plan Note (Addendum)
Mild drowsiness.  Likely from narcotics, patient doubled up on his dose due to uncontrolled pain yesterday.  Denies GI or GU symptoms or respiratory symptoms.  Has a leukocytosis, but does not meet criteria for SIRS.  ?- Ortho to evaluate left knee. ?-Monitor for now ?-Cautious narcotic use ?- Hold gabapentin and trazodone ?

## 2022-01-25 NOTE — Assessment & Plan Note (Signed)
Hemoglobin 10.7, baseline about 15 and was checked 12 days ago prior to surgery.  Suspect perisurgical and postop blood loss.  ?-Monitor closely ?

## 2022-01-25 NOTE — H&P (Addendum)
History and Physical    Joel Kim:[id number] DOB: 1945/10/02 DOA: 01/25/2022  PCP: Eartha Inch, MD   Patient coming from: Home  I have personally briefly reviewed patient's old medical records in [facility]  Chief Complaint: Leg pain, DVT on Ultrasound  HPI: Joel Kim is a 76 y.o. male with medical history significant for hypertension, hypothyroidism, asthma and stroke. Patient presented to the ED with reports of pain to his left leg, with swelling, and bullae.  He had an ultrasound done as an outpatient, ordered by his orthopedics, and was told to come to the ER for evaluation last showed that he had a DVT. Patient had left TKA - 4/26, by Dr. Eugenia Mcalpine at [facility].  4 days ago 4/28, due to bleeding concerns from the site, they went back to their orthopedist.  Spouse reports today patient's left leg looks worse than it did 4 days ago.  Swelling has worsened to involve his feet up to his thighs.  He has also had some subcutaneous bleeding to his upper inner left thigh.  Spouse reports patient is not able to bear weight, or move his left lower extremity.  He has had excruciating pain requiring double dose of Dilaudid yesterday.  Today has been a bit drowsy, his last dose of Dilaudid was this morning at about 730. Spouse has been monitoring for fever but patient has not had any.  He has not had any difficulty breathing, no chest pain.  ED Course: Stable vitals.  Potassium 2.5.  WBC 20.  Chest x-ray clear. EDP talked to orthopedist PA -Alphonsa Overall, patient was seen on Friday 3 days ago for bleeding from site.  Recommended to Xarelto or Eliquis. Patient was started on Eliquis in the ED.  Due to mild altered mental status, patient inability to bear weight, and spouse unable to care for patient, hypokalemia, with DVT.  Hospitalization was requested.  Review of Systems: As per HPI all other systems reviewed and negative.  Past Medical History:  Diagnosis Date    Asthma    Atypical nevus 01/26/2006   right side of body (slight)   Basal cell carcinoma 11/17/2015   left ear rim tx cx3 21fu   Cancer (HCC)    Hypertension    Hypothyroidism    Insomnia    Intractable migraine without aura    Melanoma (HCC) 10/10/2014   right ant. shoulder (exc)   Neuromuscular disorder (HCC)    feet   Pancreatitis    Pre-diabetes    Prostate CA (HCC)    Spondylosis of lumbar spine    Stroke (HCC) 2021   TIA x 2 some memory loss.   Thyroid disease     Past Surgical History:  Procedure Laterality Date   BACK SURGERY  2013   L4-L5 and a redo in 2014 after a fall   CHOLECYSTECTOMY  1992   FOOT SURGERY Left 1999   PROSTATECTOMY  2008   ROTATOR CUFF REPAIR Left 2021   SPINAL CORD STIMULATOR IMPLANT  2020   TOTAL KNEE ARTHROPLASTY Right 2005   TOTAL KNEE ARTHROPLASTY Left 01/19/2022   Procedure: TOTAL KNEE ARTHROPLASTY;  Surgeon: Eugenia Mcalpine, MD;  Location: [facility];  Service: Orthopedics;  Laterality: Left;  adductor canal 120     reports that he has never smoked. He has never used smokeless tobacco. He reports that he does not drink alcohol and does not use drugs.  Allergies  Allergen Reactions   Tapentadol Rash and  Other (See Comments)    Breaks patient out   Nucynta    Paroxetine Hcl Other (See Comments)    Irritability    Statins Other (See Comments)    Muscle aches   Adhesive [Tape]     Breaks patient out    Ciprofloxacin Hives   Mannitol Other (See Comments)    Unknown   Reclast [Zoledronic Acid] Other (See Comments)    Ended up in hospital/ high fever and    Water For Injection [Water, Sterile]    Flagyl [Metronidazole] Rash   Latex Rash    Irritated skin   Family history of hypertension in patient's father and mother.  No known family history of blood clots.  Prior to Admission medications   Medication Sig Start Date End Date Taking? Authorizing Provider  albuterol (PROVENTIL HFA;VENTOLIN HFA) 108 (90 Base) MCG/ACT inhaler Inhale  2 puffs into the lungs every 6 (six) hours as needed. Patient taking differently: Inhale 2 puffs into the lungs every 6 (six) hours as needed for wheezing or shortness of breath. 05/13/18   Rancour, Jeannett Senior, MD  amLODipine (NORVASC) 5 MG tablet Take 5 mg by mouth at bedtime.    [provider]  Ascorbic Acid (VITAMIN C) 1000 MG tablet Take 1,000 mg by mouth daily.    [provider]  aspirin EC 81 MG tablet Take 1 tablet (81 mg total) by mouth 2 (two) times daily. Swallow whole. 01/20/22 03/03/22  Cherie Dark, PA  cetirizine (ZYRTEC) 10 MG tablet Take 10 mg by mouth at bedtime.     [provider]  Cholecalciferol (VITAMIN D3) 50 MCG (2000 UT) TABS Take 2,000 Units by mouth daily.    [provider]  doxycycline (VIBRAMYCIN) 50 MG capsule Take 1 capsule (50 mg total) by mouth 2 (two) times daily for 5 days. 01/20/22 01/25/22  Haus, Pearletha Furl, PA  Evolocumab (REPATHA) 140 MG/ML SOSY Inject 140 mg into the skin every 14 (fourteen) days.    [provider]  Fluorouracil (TOLAK) 4 % CREA Apply to affected area Monday-Sunday qhs x 2 weeks Patient not taking: Reported on 12/22/2021 06/16/21   Glyn Ade, PA-C  hydrochlorothiazide (HYDRODIURIL) 25 MG tablet Take 25 mg by mouth daily.    [provider]  HYDROmorphone (DILAUDID) 2 MG tablet Take 1 tablet (2 mg total) by mouth every 4 (four) hours as needed for up to 7 days for severe pain. 01/20/22 01/27/22  Cherie Dark, PA  levothyroxine (SYNTHROID) 100 MCG tablet Take 100 mcg by mouth daily before breakfast. 12/06/18   [provider]  methocarbamol (ROBAXIN) 500 MG tablet Take 1 tablet (500 mg total) by mouth every 6 (six) hours as needed for muscle spasms. 01/20/22   Cherie Dark, PA  Multiple Vitamins-Minerals (MULTIVITAMIN WITH MINERALS) tablet Take 1 tablet by mouth daily.    [provider]  ondansetron (ZOFRAN) 4 MG tablet Take 1 tablet (4 mg total) by mouth daily as needed  for nausea or vomiting. 01/20/22 01/20/23  Zadie Cleverly R, PA  Oxycodone HCl 10 MG TABS Take 10 mg by mouth 4 (four) times daily.    [provider]  pantoprazole (PROTONIX) 40 MG tablet Take 1 tablet (40 mg total) by mouth daily. 01/21/13   Ernestina Penna, MD  Probiotic Product (PROBIOTIC PO) Take by mouth.    [provider]  SUMAtriptan (IMITREX) 50 MG tablet Take 1 tablet (50 mg total) by mouth as needed. Every 2 hrs  as needed 06/17/19   Micki Riley, MD  traZODone (DESYREL) 50 MG tablet Take 75 mg by mouth at bedtime. 12/26/19   [provider]  triamcinolone cream (KENALOG) 0.1 % Apply 1 application topically daily. Patient taking differently: Apply 1 application. topically daily as needed (Rash). 10/19/21   Sheffield, Judye Bos, PA-C  valACYclovir (VALTREX) 1000 MG tablet Take 1,000 mg by mouth daily as needed (Cold sores). 09/17/18   [provider]    Physical Exam: Vitals:   01/25/22 1238  BP: 133/90  Pulse: 96  Resp: 20  Temp: 98.4 F (36.9 C)  TempSrc: Oral  SpO2: 96%  Weight: 83.9 kg  Height: 5\' 4"  (1.626 m)    Constitutional: Slightly drowsy, otherwise calm, comfortable Vitals:   01/25/22 1238  BP: 133/90  Pulse: 96  Resp: 20  Temp: 98.4 F (36.9 C)  TempSrc: Oral  SpO2: 96%  Weight: 83.9 kg  Height: 5\' 4"  (1.626 m)   Eyes: PERRL, lids and conjunctivae normal ENMT: Mucous membranes are moist.  Neck: normal, supple, no masses, no thyromegaly Respiratory:  Normal respiratory effort. No accessory muscle use.  Cardiovascular: Regular rate and rhythm, left lower extremity swelling up to thighs.  Lower extremities warm, appear well-perfused Abdomen: soft, no tenderness, no masses palpated.  Musculoskeletal: no clubbing / cyanosis.  Swelling to.  Skin: Area of ecchymosis, to left inner aspect of upper thigh.  Dressing to the left lower extremity stained with old blood.  No active bleeding at this time.  Bullae present to inner aspect  of knee.  No differential warmth.  Tenderness to left lower extremity.  Limited range of motion of left knee due to pain and swelling.  He is unable to flex his left knee. Neurologic: No apparent cranial abnormality, moving extremities spontaneously. Psychiatric: Normal judgment and insight.  Slightly drowsy, but arouses spontaneously answers all questions appropriately,  oriented x 3. Normal mood.        Labs on Admission: I have personally reviewed following labs and imaging studies  CBC: Recent Labs  Lab 01/25/22 1328  WBC 20.0*  NEUTROABS 14.2*  HGB 10.7*  HCT 30.7*  MCV 93.3  PLT 561*   Basic Metabolic Panel: Recent Labs  Lab 01/25/22 1328  NA 134*  K 2.5*  CL 92*  CO2 31  GLUCOSE 169*  BUN 23  CREATININE 1.08  CALCIUM 8.8*   CBG: Recent Labs  Lab 01/19/22 0817  GLUCAP 122*    Radiological Exams on Admission: DG Chest 1 View  Result Date: 01/25/2022 CLINICAL DATA:  post op weakness EXAM: CHEST  1 VIEW COMPARISON:  01/29/2021 FINDINGS: Cardiomediastinal silhouette and pulmonary vasculature are within normal limits. Lungs are clear. Neurostimulator leads again noted in the midthoracic spine. IMPRESSION: No acute cardiopulmonary process. Electronically Signed   By: Acquanetta Belling M.D.   On: 01/25/2022 15:00   US Venous Img Lower Unilateral Left (DVT)  Result Date: 01/25/2022 CLINICAL DATA:  Left lower extremity pain and edema. History of left knee surgery on 01/19/2022. Evaluate for DVT. EXAM: LEFT LOWER EXTREMITY VENOUS DOPPLER ULTRASOUND TECHNIQUE: Gray-scale sonography with graded compression, as well as color Doppler and duplex ultrasound were performed to evaluate the lower extremity deep venous systems from the level of the common femoral vein and including the common femoral, femoral, profunda femoral, popliteal and calf veins including the posterior tibial, peroneal and gastrocnemius veins when visible. The superficial great saphenous vein was also interrogated.  Spectral Doppler was utilized to evaluate flow  at rest and with distal augmentation maneuvers in the common femoral, femoral and popliteal veins. COMPARISON:  None Available. FINDINGS: Contralateral Common Femoral Vein: Respiratory phasicity is normal and symmetric with the symptomatic side. No evidence of thrombus. Normal compressibility. Common Femoral Vein: No evidence of thrombus. Normal compressibility, respiratory phasicity and response to augmentation. Saphenofemoral Junction: No evidence of thrombus. Normal compressibility and flow on color Doppler imaging. Profunda Femoral Vein: No evidence of thrombus. Normal compressibility and flow on color Doppler imaging. Femoral Vein: No evidence of thrombus. Normal compressibility, respiratory phasicity and response to augmentation. Popliteal Vein: No evidence of thrombus. Normal compressibility, respiratory phasicity and response to augmentation. Calf Veins: There is hypoechoic thrombus involving one of the paired divisions of the left peroneal vein (representative images 39 through 44). The anterior and posterior tibial veins appear patent where imaged. Superficial Great Saphenous Vein: No evidence of thrombus. Normal compressibility. Other Findings:  None. IMPRESSION: Examination is positive for occlusive DVT involving one of the paired divisions of the left peroneal vein. There is no extension of this distal tibial DVT to the more proximal venous system of the left lower extremity. Electronically Signed   By: Simonne Come M.D.   On: 01/25/2022 12:11    EKG: Pending  Assessment/Plan Principal Problem:   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HCC) Active Problems:   Hypertension   Hypothyroidism   S/P total knee arthroplasty, left   Hypokalemia    Assessment and Plan: * 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HCC) DVT Provoked by recent left knee arthroplasty done 4/26.  Postsurgery was placed on 81 mg of aspirin twice daily.  Left lower extremity ultrasound shows occlusive DVT in perturbations of  left peroneal vein.  No extension of distal DVT to more proximal venous system. - EDP talked to orthopedics PA, recommended Eliquis or Xarelto, Eliquis started in ED. - Hold further dose for now pending Ortho eval, CBC in a.m, and monitor area of ecchymosis left thigh to ensure no expansion. - IV heparin for now  S/P total knee arthroplasty, left Recent left TKA by Dr. Eugenia Mcalpine 4/26.  Today patient's whole left lower extremity appears swollen, with pain and tenderness, which reflects his left knee, with new area of ecchymosis to left upper thigh.  He has a leukocytosis of 20 which may be postsurgical, versus stress , need to rule out infection. He was placed on doxycycline 4/27 which he will be completing today.  -Will admit to [facility] for Ortho evaluation - I talked to PA on call- Alphonsa Overall) for [facility], patient will be seen tomorrow.  Acute metabolic encephalopathy Mild drowsiness.  Likely from narcotics, patient doubled up on his dose due to uncontrolled pain yesterday.  Denies GI or GU symptoms or respiratory symptoms.  Has a leukocytosis, but does not meet criteria for SIRS.  - Ortho to evaluate left knee. -Monitor for now -Cautious narcotic use - Hold gabapentin and trazodone  Acute anemia Hemoglobin 10.7, baseline about 15 and was checked 12 days ago prior to surgery.  Suspect perisurgical and postop blood loss.  -Monitor closely  Hypokalemia Potassium 2.5, likely from HCTZ. -Check magnesium- 2.1 - replete K   Hypothyroidism Resume Synthroid  Hypertension Blood pressure stable. -Resume HCTZ, Norvasc in a.m.    DVT prophylaxis: 1 dose eliquis was given, >> Heparin for now. Code Status: Full Family Communication: Spouse at bedside Disposition Plan: ~ 2 days Consults called: Ortho Admission status: Obs tele    Onnie Boer MD [facility]  01/25/2022, 5:53  PM    For on call review [email or web address].

## 2022-01-25 NOTE — ED Triage Notes (Signed)
Pt had left knee replacement surgery last Wednesday, experienced swelling and pain, had [place] today that reveal blood clot in leg, told to come to ER for evaluation.  Pt surgery was performed at [facility] in [place], last dose of pain meds and muscle relaxer at 730 am, pt noticeable drowsy in triage. ?

## 2022-01-26 DIAGNOSIS — I82452 Acute embolism and thrombosis of left peroneal vein: Secondary | ICD-10-CM | POA: Diagnosis not present

## 2022-01-26 LAB — CBC
HCT: 28 % — ABNORMAL LOW (ref 39.0–52.0)
Hemoglobin: 9.3 g/dL — ABNORMAL LOW (ref 13.0–17.0)
MCH: 32.3 pg (ref 26.0–34.0)
MCHC: 33.2 g/dL (ref 30.0–36.0)
MCV: 97.2 fL (ref 80.0–100.0)
Platelets: 505 10*3/uL — ABNORMAL HIGH (ref 150–400)
RBC: 2.88 MIL/uL — ABNORMAL LOW (ref 4.22–5.81)
RDW: 15.4 % (ref 11.5–15.5)
WBC: 19 10*3/uL — ABNORMAL HIGH (ref 4.0–10.5)
nRBC: 0.3 % — ABNORMAL HIGH (ref 0.0–0.2)

## 2022-01-26 LAB — BASIC METABOLIC PANEL
Anion gap: 7 (ref 5–15)
BUN: 22 mg/dL (ref 8–23)
CO2: 29 mmol/L (ref 22–32)
Calcium: 8.1 mg/dL — ABNORMAL LOW (ref 8.9–10.3)
Chloride: 99 mmol/L (ref 98–111)
Creatinine, Ser: 0.96 mg/dL (ref 0.61–1.24)
GFR, Estimated: >60 mL/min (ref >60)
Glucose, Bld: 112 mg/dL — ABNORMAL HIGH (ref 70–99)
Potassium: 3.5 mmol/L (ref 3.5–5.1)
Sodium: 135 mmol/L (ref 135–145)

## 2022-01-26 LAB — APTT
aPTT: 41 seconds — ABNORMAL HIGH (ref 24–36)
aPTT: 88 seconds — ABNORMAL HIGH (ref 24–36)

## 2022-01-26 LAB — HEPARIN LEVEL (UNFRACTIONATED): Heparin Unfractionated: >1.1 IU/mL — ABNORMAL HIGH (ref 0.30–0.70)

## 2022-01-26 MED ORDER — SODIUM CHLORIDE 0.9 % IV SOLN
2.0000 g | INTRAVENOUS | Status: DC
  Administered 2022-01-26 – 2022-01-29 (×4): 2 g via INTRAVENOUS
  Filled 2022-01-26 (×4): qty 20

## 2022-01-26 MED ORDER — HEPARIN BOLUS VIA INFUSION
2000.0000 [IU] | Freq: Once | INTRAVENOUS | Status: AC
  Administered 2022-01-26: 2000 [IU] via INTRAVENOUS
  Filled 2022-01-26: qty 2000

## 2022-01-26 MED ORDER — HYDROCODONE-ACETAMINOPHEN 10-325 MG PO TABS
1.0000 | ORAL_TABLET | Freq: Four times a day (QID) | ORAL | Status: DC
  Administered 2022-01-26 – 2022-01-30 (×18): 1 via ORAL
  Filled 2022-01-26 (×18): qty 1

## 2022-01-26 MED ORDER — HEPARIN (PORCINE) 25000 UT/250ML-% IV SOLN
1550.0000 [IU]/h | INTRAVENOUS | Status: DC
  Administered 2022-01-26 – 2022-01-28 (×3): 1250 [IU]/h via INTRAVENOUS
  Administered 2022-01-28: 1550 [IU]/h via INTRAVENOUS
  Filled 2022-01-26 (×4): qty 250

## 2022-01-26 MED ORDER — HYDROMORPHONE HCL 1 MG/ML IJ SOLN
1.0000 mg | INTRAMUSCULAR | Status: DC | PRN
  Administered 2022-01-26 – 2022-01-30 (×22): 1 mg via INTRAVENOUS
  Filled 2022-01-26 (×22): qty 1

## 2022-01-26 MED ORDER — METHOCARBAMOL 500 MG PO TABS
500.0000 mg | ORAL_TABLET | Freq: Four times a day (QID) | ORAL | Status: AC
  Administered 2022-01-26 – 2022-01-31 (×20): 500 mg via ORAL
  Filled 2022-01-26 (×20): qty 1

## 2022-01-26 NOTE — Plan of Care (Signed)

## 2022-01-26 NOTE — Progress Notes (Signed)
Pt will use [facility], RN will re consult when back to bed. ?

## 2022-01-26 NOTE — Progress Notes (Signed)
ANTICOAGULATION CONSULT NOTE  ? ?Pharmacy Consult for heparin ?Indication: DVT ? ?Allergies  ?Allergen Reactions  ? Tapentadol Rash and Other (See Comments)  ?  Breaks patient out  ? ?Nucynta   ? Paroxetine Hcl Other (See Comments)  ?  Irritability   ? Statins Other (See Comments)  ?  Muscle aches  ? Adhesive [Tape]   ?  Breaks patient out   ? Ciprofloxacin Hives  ? Mannitol Other (See Comments)  ?  Unknown  ? Reclast [Zoledronic Acid] Other (See Comments)  ?  Ended up in hospital/ high fever and   ? Water For Injection [employer], Sterile]   ? Flagyl [Metronidazole] Rash  ? Latex Rash  ?  Irritated skin  ? ? ?Patient Measurements: ?Height: '5\' 4"'$  (162.6 cm) ?Weight: 83.9 kg (185 lb) ?IBW/kg (Calculated) : 59.2 ?Heparin Dosing Weight: 77 kg ? ?Vital Signs: ?Temp: 98.1 ?F (36.7 ?C) (05/03 2014) ?Temp Source: Oral (05/03 2014) ?BP: 157/84 (05/03 2014) ?Pulse Rate: 93 (05/03 2014) ? ?Labs: ?Recent Labs  ?  01/25/22 ?1328 01/26/22 ?0450 01/26/22 ?0942 01/26/22 ?1830  ?HGB 10.7* 9.3*  --   --   ?HCT 30.7* 28.0*  --   --   ?PLT 561* 505*  --   --   ?APTT  --   --  41* 88*  ?HEPARINUNFRC  --   --  >1.10*  --   ?CREATININE 1.08 0.96  --   --   ? ? ? ?Estimated Creatinine Clearance: 65 mL/min (by C-G formula based on SCr of 0.96 mg/dL). ? ? ?Medical History: ?Past Medical History:  ?Diagnosis Date  ? Asthma   ? Atypical nevus 01/26/2006  ? right side of body (slight)  ? Basal cell carcinoma 11/17/2015  ? left ear rim tx cx3 43f  ? [facility] ([facility]   ? Hypertension   ? Hypothyroidism   ? Insomnia   ? 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 [place]  ? right ant. shoulder (exc)  ? Neuromuscular disorder ([place]   ? feet  ? Pancreatitis   ? Pre-diabetes   ? [facility]state CA ([facility]   ? Spondylosis of lumbar spine   ? Stroke ([facility] 2021  ? TIA x 2 some memory loss.  ? Thyroid disease   ? ? ?Medications:  ?Medications Prior to Admission  ?Medication Sig Dispense Refill Last Dose  ? albuterol (PROVENTIL HFA;VENTOLIN HFA) 108 (90 Base)  MCG/ACT inhaler Inhale 2 puffs into the lungs every 6 (six) hours as needed. (Patient taking differently: Inhale 2 puffs into the lungs every 6 (six) hours as needed for wheezing or shortness of breath.) 1 Inhaler 0 unknown  ? amLODipine (NORVASC) 5 MG tablet Take 1 tablet by mouth daily.   01/24/2022  ? Ascorbic Acid (VITAMIN C) 1000 MG tablet Take 1,000 mg by mouth daily.   01/24/2022  ? aspirin EC 81 MG tablet Take 1 tablet (81 mg total) by mouth 2 (two) times daily. Swallow whole. 84 tablet 0 01/24/2022  ? cetirizine (ZYRTEC) 10 MG tablet Take 10 mg by mouth at bedtime.    01/24/2022  ? Cholecalciferol (VITAMIN D3) 50 MCG (2000 UT) TABS Take 2,000 Units by mouth daily.   01/24/2022  ? [EXPIRED] doxycycline (VIBRAMYCIN) 50 MG capsule Take 1 capsule (50 mg total) by mouth 2 (two) times daily for 5 days. 10 capsule 0 01/24/2022  ? Evolocumab (REPATHA) 140 MG/ML SOSY Inject 140 mg into the skin every 14 (fourteen) days.   unknown  ?  gabapentin (NEURONTIN) 300 MG capsule Take 300 mg by mouth daily.   01/24/2022  ? hydrochlorothiazide (HYDRODIURIL) 25 MG tablet Take 25 mg by mouth daily.   01/24/2022  ? HYDROmorphone (DILAUDID) 2 MG tablet Take 1 tablet (2 mg total) by mouth every 4 (four) hours as needed for up to 7 days for severe pain. 42 tablet 0 01/25/2022 at 0730  ? levothyroxine (SYNTHROID) 100 MCG tablet Take 100 mcg by mouth daily before breakfast.   01/24/2022  ? methocarbamol (ROBAXIN) 500 MG tablet Take 1 tablet (500 mg total) by mouth every 6 (six) hours as needed for muscle spasms. 60 tablet 0 01/25/2022  ? Multiple Vitamins-Minerals (MULTIVITAMIN WITH MINERALS) tablet Take 1 tablet by mouth daily.   01/24/2022  ? mupirocin ointment (BACTROBAN) 2 % Apply 1 application. topically See admin instructions. 2-3 times daily   01/24/2022  ? ondansetron (ZOFRAN) 4 MG tablet Take 1 tablet (4 mg total) by mouth daily as needed for nausea or vomiting. 50 tablet 1 unknown  ? Oxycodone HCl 10 MG TABS Take 10 mg by mouth 4 (four) times daily.    Past Week  ? pantoprazole (PROTONIX) 40 MG tablet Take 1 tablet (40 mg total) by mouth daily. 30 tablet 3 01/24/2022  ? Probiotic Product (PROBIOTIC PO) Take by mouth.   01/24/2022  ? SUMAtriptan (IMITREX) 50 MG tablet Take 1 tablet (50 mg total) by mouth as needed. Every 2 hrs as needed 10 tablet 0 unknown  ? traZODone (DESYREL) 50 MG tablet Take 75 mg by mouth at bedtime.   01/24/2022  ? valACYclovir (VALTREX) 1000 MG tablet Take 1,000 mg by mouth daily as needed (Cold sores).     ? amLODipine (NORVASC) 5 MG tablet Take 5 mg by mouth at bedtime. (Patient not taking: Reported on 01/25/2022)   Not Taking  ? Fluorouracil (TOLAK) 4 % CREA Apply to affected area Monday-Sunday qhs x 2 weeks (Patient not taking: Reported on 12/22/2021) 40 g 1 Not Taking  ? triamcinolone cream (KENALOG) 0.1 % Apply 1 application topically daily. (Patient not taking: Reported on 01/25/2022) 15 g 0 Not Taking  ? ? ?Assessment: ?76 yo M s/p L TKR 4/26 with new L DVT. Pharmacy consulted for heparin therapy. ?He got 1 dose of LMWH 40 mg on 4/27 & was discharged on asa 81 bid to start 4/28.  ?He got 1 dose Eliquis 10 mg in ED 5/2 @ 1726 ?He got 1 dose SQ heparin 5000 units 5/3 @ 05 am ?Pt w/ ecchymosis on L leg- has been evaluated by ortho & TRH  ?Hg 9.2 today. Preop Hg was 15.5 on 01/13/22. ?PLT elevated > 500 , preop was 224 on 01/13/22 ? ?01/26/22 ?aPTT = 88 sec (therapeutic) with heparin gtt @ 1250 units/hr ?Pt with known ecchymosis on L leg - no worsening or new events noted by RN ? ?Goal of Therapy:  ?Heparin level 0.3-0.7 units/ml ?aPTT 66-102 seconds ?Monitor platelets by anticoagulation protocol: Yes ?  ?Plan:  ?Continue heparin drip 1250 units/hr  ?Check 8 hr aPTT/Heparin level to confirm therapeutic dose ?Daily CBC, aPTT, heparin level ?F/u for transition to Eliquis ? ?Leone Haven, PharmD ?01/26/2022 8:24 PM ? ? ?

## 2022-01-26 NOTE — Progress Notes (Signed)
ANTICOAGULATION CONSULT NOTE - Initial Consult ? ?Pharmacy Consult for heparin ?Indication: DVT ? ?Allergies  ?Allergen Reactions  ? Tapentadol Rash and Other (See Comments)  ?  Breaks patient out  ? ?Nucynta   ? Paroxetine Hcl Other (See Comments)  ?  Irritability   ? Statins Other (See Comments)  ?  Muscle aches  ? Adhesive [Tape]   ?  Breaks patient out   ? Ciprofloxacin Hives  ? Mannitol Other (See Comments)  ?  Unknown  ? Reclast [Zoledronic Acid] Other (See Comments)  ?  Ended up in hospital/ high fever and   ? Water For Injection [employer], Sterile]   ? Flagyl [Metronidazole] Rash  ? Latex Rash  ?  Irritated skin  ? ? ?Patient Measurements: ?Height: '5\' 4"'$  (162.6 cm) ?Weight: 83.9 kg (185 lb) ?IBW/kg (Calculated) : 59.2 ?Heparin Dosing Weight: 77 kg ? ?Vital Signs: ?Temp: 100 ?F (37.8 ?C) (05/03 0730) ?Temp Source: Oral (05/03 0730) ?BP: 155/87 (05/03 0730) ?Pulse Rate: 100 (05/03 0730) ? ?Labs: ?Recent Labs  ?  01/25/22 ?1328 01/26/22 ?0450  ?HGB 10.7* 9.3*  ?HCT 30.7* 28.0*  ?PLT 561* 505*  ?CREATININE 1.08 0.96  ? ? ?Estimated Creatinine Clearance: 65 mL/min (by C-G formula based on SCr of 0.96 mg/dL). ? ? ?Medical History: ?Past Medical History:  ?Diagnosis Date  ? Asthma   ? Atypical nevus 01/26/2006  ? right side of body (slight)  ? Basal cell carcinoma 11/17/2015  ? left ear rim tx cx3 35f  ? Cancer ([facility]   ? Hypertension   ? Hypothyroidism   ? Insomnia   ? 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 [place]  ? right ant. shoulder (exc)  ? [place]rom[place] ([place]   ? feet  ? Pancreatitis   ? Pre-diabetes   ? [facility] ([facility]   ? Spondylosis of lumbar spine   ? Stroke ([facility] 2021  ? TIA x 2 some memory loss.  ? Thyroid disease   ? ? ?Medications:  ?Medications Prior to Admission  ?Medication Sig Dispense Refill Last Dose  ? albuterol (PROVENTIL HFA;VENTOLIN HFA) 108 (90 Base) MCG/ACT inhaler Inhale 2 puffs into the lungs every 6 (six) hours as needed. (Patient taking differently: Inhale 2  puffs into the lungs every 6 (six) hours as needed for wheezing or shortness of breath.) 1 Inhaler 0 unknown  ? amLODipine (NORVASC) 5 MG tablet Take 1 tablet by mouth daily.   01/24/2022  ? Ascorbic Acid (VITAMIN C) 1000 MG tablet Take 1,000 mg by mouth daily.   01/24/2022  ? aspirin EC 81 MG tablet Take 1 tablet (81 mg total) by mouth 2 (two) times daily. Swallow whole. 84 tablet 0 01/24/2022  ? cetirizine (ZYRTEC) 10 MG tablet Take 10 mg by mouth at bedtime.    01/24/2022  ? Cholecalciferol (VITAMIN D3) 50 MCG (2000 UT) TABS Take 2,000 Units by mouth daily.   01/24/2022  ? [EXPIRED] doxycycline (VIBRAMYCIN) 50 MG capsule Take 1 capsule (50 mg total) by mouth 2 (two) times daily for 5 days. 10 capsule 0 01/24/2022  ? Evolocumab (REPATHA) 140 MG/ML SOSY Inject 140 mg into the skin every 14 (fourteen) days.   unknown  ? gabapentin (NEURONTIN) 300 MG capsule Take 300 mg by mouth daily.   01/24/2022  ? hydrochlorothiazide (HYDRODIURIL) 25 MG tablet Take 25 mg by mouth daily.   01/24/2022  ? HYDROmorphone (DILAUDID) 2 MG tablet Take 1 tablet (2 mg total) by mouth every 4 (four) hours  as needed for up to 7 days for severe pain. 42 tablet 0 01/25/2022 at 0730  ? levothyroxine (SYNTHROID) 100 MCG tablet Take 100 mcg by mouth daily before breakfast.   01/24/2022  ? methocarbamol (ROBAXIN) 500 MG tablet Take 1 tablet (500 mg total) by mouth every 6 (six) hours as needed for muscle spasms. 60 tablet 0 01/25/2022  ? Multiple Vitamins-Minerals (MULTIVITAMIN WITH MINERALS) tablet Take 1 tablet by mouth daily.   01/24/2022  ? mupirocin ointment (BACTROBAN) 2 % Apply 1 application. topically See admin instructions. 2-3 times daily   01/24/2022  ? ondansetron (ZOFRAN) 4 MG tablet Take 1 tablet (4 mg total) by mouth daily as needed for nausea or vomiting. 50 tablet 1 unknown  ? Oxycodone HCl 10 MG TABS Take 10 mg by mouth 4 (four) times daily.   Past Week  ? pantoprazole (PROTONIX) 40 MG tablet Take 1 tablet (40 mg total) by mouth daily. 30 tablet 3  01/24/2022  ? Probiotic Product (PROBIOTIC PO) Take by mouth.   01/24/2022  ? SUMAtriptan (IMITREX) 50 MG tablet Take 1 tablet (50 mg total) by mouth as needed. Every 2 hrs as needed 10 tablet 0 unknown  ? traZODone (DESYREL) 50 MG tablet Take 75 mg by mouth at bedtime.   01/24/2022  ? valACYclovir (VALTREX) 1000 MG tablet Take 1,000 mg by mouth daily as needed (Cold sores).     ? amLODipine (NORVASC) 5 MG tablet Take 5 mg by mouth at bedtime. (Patient not taking: Reported on 01/25/2022)   Not Taking  ? Fluorouracil (TOLAK) 4 % CREA Apply to affected area Monday-Sunday qhs x 2 weeks (Patient not taking: Reported on 12/22/2021) 40 g 1 Not Taking  ? triamcinolone cream (KENALOG) 0.1 % Apply 1 application topically daily. (Patient not taking: Reported on 01/25/2022) 15 g 0 Not Taking  ? ? ?Assessment: ?76 yo M s/p L TKR 4/26 with new L DVT. Pharmacy consulted for heparin therapy. ?He got 1 dose of LMWH 40 mg on 4/27 & was discharged on asa 81 bid to start 4/28.  ?He got 1 dose Eliquis 10 mg in ED 5/2 @ 1726 ?He got 1 dose SQ heparin 5000 units 5/3 @ 05 am ?Pt w/ ecchymosis on L leg- has been evaluated by ortho & TRH  ?Hg 9.2 today. Preop Hg was 15.5 on 01/13/22. ?PLT elevated > 500 , preop was 224 on 01/13/22 ? ?Goal of Therapy:  ?Heparin level 0.3-0.7 units/ml ?aPTT 66-102 seconds ?Monitor platelets by anticoagulation protocol: Yes ?  ?Plan:  ?DC SQH ?Draw baseline aPTT & heparin level now because he got 1 dose of Eliquis 5/2  ?Heparin bolus 2000 units IV x 1 then heparin drip 1250 units/hr  ?Check 8 hr aPTT/Heparin level ?Daily CBC, aPTT, heparin level ?F/u for transition to Eliquis ? ?Eudelia Bunch, Pharm.D ?01/26/2022 10:13 AM ? ? ?

## 2022-01-26 NOTE — Progress Notes (Signed)
?PROGRESS NOTE ? ?[name]  ?DOB: [birth year]  ?PCP: [name], MD ?NOB:[id number]  ?DOA: 01/25/2022 ? LOS: 0 days  ?Hospital Day: 2 ? ?Brief narrative: ?Joel Kim is a 76 y.o. male with PMH significant for HTN, TIA, hypothyroidism, asthma who had a left TKA done by on 4/26, discharged on 4/27 on aspirin 81 mg twice daily for DVT prophylaxis, doxycycline.  Per patient's wife, prior to discharge, patient had noticed swelling in his left leg and his mobility had gotten worse.  He was recommended outpatient PT which was supposed to start from this week ?4/28, patient was seen in the office again because of the concern of bleeding at the site.  Per patient's wife, his bandage was changed and he was sent back to home. ?5/2, patient presented to [facility] with worsening swelling involving whole of the left lower extremity as well as multiple areas of ecchymosis. ? ?In the ED, patient was afebrile. ?Labs with sodium 134, potassium 2.5, WBC count elevated to 20, hemoglobin 10.7 ?Ultrasound duplex of left lower extremity showed an occlusive DVT involving one of the paired divisions of the left peroneal vein.  No extension of distal DVT to more proximal venous system ?EDP discussed with orthopedist ?Patient was started on Eliquis ?Admitted to hospitalist service ? ?Subjective: ?Patient was seen and examined this morning.  Pleasant elderly Caucasian male.  Lying on bed.  Pain controlled.  Wife at bedside. ?Chart reviewed. ?Temperature 100 this morning ? ?Principal Problem: ?  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[place]) ?Active Problems: ?  S/P total knee arthroplasty, left ?  Hypertension ?  Hypothyroidism ?  Hypokalemia ?  Acute anemia ?  Acute metabolic encephalopathy ?  ?Assessment and Plan: ?Left leg DVT ?-Presented with progressively worsening swelling of the left lower extremity after recent left TKA on 4/26.  He was placed on aspirin 81 mg twice daily at discharge which he was taking as advised. ?-Ultrasound duplex as above  showing acute DVT on the left lower extremity. ?-After discussion with orthopedics, patient was started on Eliquis on admission. ?-I would initiate him on IV heparin drip for now with a plan to switch him to Eliquis at discharge. ? ?Gross swelling or ecchymosis of whole left lower extremity ?Recent left total knee arthroplasty 4/26 ?-The swelling seems disproportionately larger than an acute DVT of peroneal vein would cause.   ?-In light of recent surgery, leukocytosis, infection still remains a consideration.  I will empirically start him on IV Rocephin.  There is also an area of ecchymosis in the medial aspect of left thigh.  We will watch if the ecchymosis size increases or hemoglobin drops while on heparin drip. ?-Orthopedics following up. ?-For pain control, I ordered for scheduled Robaxin, scheduled Percocet and as needed IV Dilaudid ?  ?Acute metabolic encephalopathy ?-Mild drowsiness likely because of narcotics use to control pain.   ?-Cautious narcotic use ?-Continue Flexeril ?-Hold gabapentin and trazodone ?-Continue to monitor mental status  ? ?Acute anemia ?Hemoglobin 10.7, baseline about 15 and was checked 12 days ago prior to surgery.  Suspect perisurgical and postop blood loss.  ?-Monitor closely ?Recent Labs  ?  01/29/21 ?1445 01/13/22 ?1437 01/25/22 ?1328 01/26/22 ?0450  ?HGB 16.6 15.5 10.7* 9.3*  ?MCV 92.3 90.4 93.3 97.2  ?  ?Hypokalemia ?-Potassium was low at 2.5 on admission.  Replacement given.  3.5 this morning. ?Recent Labs  ?Lab 01/25/22 ?1328 01/26/22 ?0450  ?K 2.5* 3.5  ?MG 2.1  --   ? ?  Hypothyroidism ?-Resume Synthroid ?  ?Hypertension ?-Blood pressure stable. ?-Continue HCTZ 25 mg daily and Norvasc 5 mg daily. ? ?Hyperlipidemia ?-Repatha as an outpatient ? ?Goals of care ?  Code Status: Full Code  ? ? ?Mobility: PT eval probably starting tomorrow ? ?Skin assessment:  ?  ? ?Nutritional status:  ?Body mass index is 31.76 kg/m?.  ?  ?  ? ? ? ? ?Diet:  ?Diet Order   ? ?       ?  Diet Heart  Room service appropriate? Yes; Fluid consistency: Thin  Diet effective now       ?  ? ?  ?  ? ?  ? ? ?DVT prophylaxis:  ? ?  ?Antimicrobials: IV Rocephin empirically ?Fluid: None ?Consultants: Orthopedics ?Family Communication: Wife at bedside ? ?Status is: Observation ? ?Continue in-hospital care because: Acute DVT, gross swelling of left lower extremity, significant pain ?Level of care: Telemetry Medical  ? ?Dispo: The patient is from: Home ?             Anticipated d/c is to: Pending clinical course, pending eval by PT ?             Patient currently is not medically stable to d/c. ?  Difficult to place patient No ? ? ? ? ?Infusions:  ? cefTRIAXone (ROCEPHIN)  IV 2 g (01/26/22 1213)  ? heparin 1,250 Units/hr (01/26/22 1049)  ? ? ?Scheduled Meds: ? amLODipine  5 mg Oral Daily  ? hydrochlorothiazide  25 mg Oral Daily  ? HYDROcodone-acetaminophen  1 tablet Oral Q6H  ? levothyroxine  100 mcg Oral QAC breakfast  ? methocarbamol  500 mg Oral Q6H  ? pantoprazole  40 mg Oral Daily  ? ? ?PRN meds: ?acetaminophen **OR** acetaminophen, HYDROmorphone (DILAUDID) injection, ondansetron **OR** ondansetron (ZOFRAN) IV, polyethylene glycol  ? ?Antimicrobials: ?Anti-infectives (From admission, onward)  ? ? Start     Dose/Rate Route Frequency Ordered Stop  ? 01/26/22 1200  cefTRIAXone (ROCEPHIN) 2 g in sodium chloride 0.9 % 100 mL IVPB       ? 2 g ?200 mL/hr over 30 Minutes Intravenous Every 24 hours 01/26/22 1001    ? 01/25/22 2200  doxycycline (VIBRAMYCIN) 50 MG capsule 50 mg  Status:  Discontinued       ? 50 mg Oral 2 times daily 01/25/22 1940 01/25/22 2112  ? 01/25/22 2200  doxycycline (VIBRA-TABS) tablet 50 mg       ? 50 mg Oral Every 12 hours 01/25/22 2112 01/26/22 0925  ? ?  ? ? ?Objective: ?Vitals:  ? 01/26/22 0403 01/26/22 0730  ?BP: (!) 141/77 (!) 155/87  ?Pulse: 99 100  ?Resp: 18 18  ?Temp: 98.3 ?F (36.8 ?C) 100 ?F (37.8 ?C)  ?SpO2: 91% 94%  ? ? ?Intake/Output Summary (Last 24 hours) at 01/26/2022 1256 ?Last data filed at  01/26/2022 0915 ?Gross per 24 hour  ?Intake --  ?Output 700 ml  ?Net -700 ml  ? ?Filed Weights  ? 01/25/22 1238  ?Weight: 83.9 kg  ? ?Weight change:  ?Body mass index is 31.76 kg/m?.  ? ?Physical Exam: ?General exam: Pleasant, elderly Caucasian male.  Not in distress. ?Skin: No rashes, lesions or ulcers. ?HEENT: Atraumatic, normocephalic, no obvious bleeding ?Lungs: Clear to auscultation bilaterally ?CVS: Regular rate and rhythm, no murmur ?GI/Abd soft, nontender, nondistended, bowel present ?CNS: Alert, awake, oriented x3 ?Psychiatry: Mood appropriate ?Extremities: No pedal edema, no calf tenderness on the right, left leg swollen from foot to thigh.  An area of ecchymosis on the medial aspect of thigh.  Surgical incision site in left knee is bandaged. ? ?Data Review: I have personally reviewed the laboratory data and studies available. ? ?F/u labs ordered ?Unresulted Labs (From admission, onward)  ? ?  Start     Ordered  ? 01/26/22 1900  APTT  Once-Timed,   TIMED       ?Question:  Specimen collection method  Answer:  Lab=Lab collect  ? 01/26/22 1135  ? Unscheduled  CBC with Differential/Platelet  Daily,   R     ?Question:  Specimen collection method  Answer:  Lab=Lab collect  ? 01/26/22 1256  ? Unscheduled  Basic metabolic panel  Daily,   R     ?Question:  Specimen collection method  Answer:  Lab=Lab collect  ? 01/26/22 1256  ? ?  ?  ? ?  ? ? ?Signed, ?Terrilee Croak, MD ?[facility] ?01/26/2022 ? ? ? ? ? ? ? ? ? ? ? ? ?

## 2022-01-26 NOTE — TOC Initial Note (Signed)
Transition of Care (TOC) - Initial/Assessment Note  ? ? ?Patient Details  ?Name: Joel Kim ?MRN: [id number] ?Date of Birth: October 06, 1945 ? ?Transition of Care (TOC) CM/SW Contact:    ?Tawanna Cooler, RN ?Phone Number: ?01/26/2022, 11:23 AM ? ?Clinical Narrative:                 ? ?Patient had a left TKA on 4/26. Was discharged home with outpatient PT.   ?TOC following for any new discharge needs.  ? ? ?Expected Discharge Plan: Home/Self Care ?Barriers to Discharge: Continued Medical Work up ? ? ? ?Expected Discharge Plan and Services ?Expected Discharge Plan: Home/Self Care ?  ?   ?Living arrangements for the past 2 months: [place] ?                ?  ?Prior Living Arrangements/Services ?Living arrangements for the past 2 months: [place] ?Lives with:: Spouse ?  ?  ?Activities of Daily Living ?Home Assistive Devices/Equipment: Walker (specify type), CPAP ?ADL Screening (condition at time of admission) ?Patient's cognitive ability adequate to safely complete daily activities?: Yes ?Is the patient deaf or have difficulty hearing?: Yes ?Does the patient have difficulty seeing, even when wearing glasses/contacts?: Yes ?Does the patient have difficulty concentrating, remembering, or making decisions?: No ?Patient able to express need for assistance with ADLs?: No ?Does the patient have difficulty dressing or bathing?: No ?Independently performs ADLs?: Yes (appropriate for developmental age) ?Does the patient have difficulty walking or climbing stairs?: Yes ?Weakness of Legs: Both ?Weakness of Arms/Hands: Both ? ?Emotional Assessment ? Orientation: : Oriented to Self, Oriented to Place, Oriented to  Time, Oriented to Situation ?Alcohol / Substance Use: Not Applicable ?Psych Involvement: No (comment) ? ?Admission diagnosis:  Hypokalemia [E87.6] ?Post-op pain [G89.18] ?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[place]) [I82.402] ?Acute deep vein thrombosis (DVT) of femoral vein of left lower extremity ([place]) [I82.412] ?Patient  Active Problem List  ? Diagnosis Date Noted  ? 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[place]) 01/25/2022  ? S/P total knee arthroplasty, 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  ? Hypokalemia 01/25/2022  ? Acute anemia 01/25/2022  ? Acute metabolic encephalopathy 26/83/4196  ? 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 Atypical chest pain 08/15/2018  ? Hyperlipidemia 08/15/2018  ? Family history of heart disease 08/15/2018  ? Hypertension 05/31/2018  ? Hypothyroidism 05/31/2018  ? Acute pancreatitis 05/31/2018  ? Mild renal insufficiency 05/31/2018  ? ?PCP:  [name], MD ?Pharmacy:   ?[facility] - [place], [address] ?[address] ?[place] [address] ?Phone: [phone] Fax: [phone] ? ? ?

## 2022-01-26 NOTE — Progress Notes (Signed)
Subjective: ?This is a pleasant 76 year old male who is an established patient of ours who unfortunately presented to the ED at [facility] yesterday due to a left leg DVT.  When he got to the emergency room they noticed that his potassium was 2.5.  They admitted him overnight at [facility] for observation.  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  He is having significant pain in this left leg.  He is unable to weight-bear on this leg.  He states that the pain increased over the weekend.  He has been unable to get up and ambulate.  ? ?Objective: ?Vital signs in last 24 hours: ?Temp:  [98.3 ?F (36.8 ?C)-100 ?F (37.8 ?C)] 100 ?F (37.8 ?C) (05/03 0730) ?Pulse Rate:  [96-106] 100 (05/03 0730) ?Resp:  [15-20] 18 (05/03 0730) ?BP: (133-158)/(76-90) 155/87 (05/03 0730) ?SpO2:  [91 %-96 %] 94 % (05/03 0730) ?Weight:  [83.9 kg] 83.9 kg (05/02 1238) ? ?Intake/Output from previous day: ?05/02 0701 - 05/03 0700 ?In: -  ?Out: 350 [Urine:350] ?Intake/Output this shift: ?No intake/output data recorded. ? ?Recent Labs  ?  01/25/22 ?1328 01/26/22 ?0450  ?HGB 10.7* 9.3*  ? ?Recent Labs  ?  01/25/22 ?1328 01/26/22 ?0450  ?WBC 20.0* 19.0*  ?RBC 3.29* 2.88*  ?HCT 30.7* 28.0*  ?PLT 561* 505*  ? ?Recent Labs  ?  01/25/22 ?1328  ?NA 134*  ?K 2.5*  ?CL 92*  ?CO2 31  ?BUN 23  ?CREATININE 1.08  ?GLUCOSE 169*  ?CALCIUM 8.8*  ? ?No results for input(s): LABPT, INR in the last 72 hours. ? ?General, alert oriented x3, no acute distress, appears stated age ?Musculoskeletal: On exam of his left leg incision site looks good, no signs of infection.  He does have noticeable blisters on the medial aspect of the leg.  A sizable area of ecchymosis noted on the medial aspect of the thigh.  He is very tender to palpation throughout the entirety of the left lower extremity.  Significant swelling noted.  Able to dorsiflex and plantarflex at the ankle minimally.  Able to move toes.  2+ palpable posterior tibialis pulse.  Good  capillary refill. ? ? ? 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 ?He is doing okay, pain is severe in his left lower extremity.  I placed a new Aquacel dressing on this leg.  He needs to keep this on do not touch, do not remove.  It is okay to get wet.  He is currently on heparin for DVT treatment.  This is being monitored by hospitalist team.  At this time we will hold off on any PT until he starts feeling better in this leg.  He is okay to dorsiflex and move the leg as much as he can tolerate.  Continue icing, continue elevating.  We will continue to follow while he is in the hospital. ? ? ?Drue Novel, PA-C ?Dr. Theda Sers ?[facility] ?[phone] ?01/26/2022, 7:31 AM  ? ? ? ? ?

## 2022-01-27 ENCOUNTER — Encounter: Payer: Medicare HMO | Admitting: Physical Therapy

## 2022-01-27 DIAGNOSIS — Y838 Other surgical procedures as the cause of abnormal reaction of the patient, or of later complication, without mention of misadventure at the time of the procedure: Secondary | ICD-10-CM | POA: Diagnosis present

## 2022-01-27 DIAGNOSIS — G43909 Migraine, unspecified, not intractable, without status migrainosus: Secondary | ICD-10-CM | POA: Diagnosis present

## 2022-01-27 DIAGNOSIS — I10 Essential (primary) hypertension: Secondary | ICD-10-CM | POA: Diagnosis present

## 2022-01-27 DIAGNOSIS — E039 Hypothyroidism, unspecified: Secondary | ICD-10-CM | POA: Diagnosis present

## 2022-01-27 DIAGNOSIS — R21 Rash and other nonspecific skin eruption: Secondary | ICD-10-CM | POA: Diagnosis present

## 2022-01-27 DIAGNOSIS — K219 Gastro-esophageal reflux disease without esophagitis: Secondary | ICD-10-CM | POA: Diagnosis present

## 2022-01-27 DIAGNOSIS — D62 Acute posthemorrhagic anemia: Secondary | ICD-10-CM | POA: Diagnosis present

## 2022-01-27 DIAGNOSIS — Y929 Unspecified place or not applicable: Secondary | ICD-10-CM | POA: Diagnosis not present

## 2022-01-27 DIAGNOSIS — R7303 Prediabetes: Secondary | ICD-10-CM | POA: Diagnosis present

## 2022-01-27 DIAGNOSIS — M7989 Other specified soft tissue disorders: Secondary | ICD-10-CM | POA: Diagnosis present

## 2022-01-27 DIAGNOSIS — G894 Chronic pain syndrome: Secondary | ICD-10-CM | POA: Diagnosis present

## 2022-01-27 DIAGNOSIS — E876 Hypokalemia: Secondary | ICD-10-CM | POA: Diagnosis present

## 2022-01-27 DIAGNOSIS — Z8546 Personal history of malignant neoplasm of prostate: Secondary | ICD-10-CM | POA: Diagnosis not present

## 2022-01-27 DIAGNOSIS — I82452 Acute embolism and thrombosis of left peroneal vein: Secondary | ICD-10-CM | POA: Diagnosis present

## 2022-01-27 DIAGNOSIS — Z8249 Family history of ischemic heart disease and other diseases of the circulatory system: Secondary | ICD-10-CM | POA: Diagnosis not present

## 2022-01-27 DIAGNOSIS — Z8673 Personal history of transient ischemic attack (TIA), and cerebral infarction without residual deficits: Secondary | ICD-10-CM | POA: Diagnosis not present

## 2022-01-27 DIAGNOSIS — Z91040 Latex allergy status: Secondary | ICD-10-CM | POA: Diagnosis not present

## 2022-01-27 DIAGNOSIS — Z85820 Personal history of malignant melanoma of skin: Secondary | ICD-10-CM | POA: Diagnosis not present

## 2022-01-27 DIAGNOSIS — Z96653 Presence of artificial knee joint, bilateral: Secondary | ICD-10-CM | POA: Diagnosis present

## 2022-01-27 DIAGNOSIS — Z9682 Presence of neurostimulator: Secondary | ICD-10-CM | POA: Diagnosis not present

## 2022-01-27 DIAGNOSIS — G4733 Obstructive sleep apnea (adult) (pediatric): Secondary | ICD-10-CM | POA: Diagnosis present

## 2022-01-27 DIAGNOSIS — G9341 Metabolic encephalopathy: Secondary | ICD-10-CM | POA: Diagnosis present

## 2022-01-27 DIAGNOSIS — J45909 Unspecified asthma, uncomplicated: Secondary | ICD-10-CM | POA: Diagnosis present

## 2022-01-27 DIAGNOSIS — T8189XA Other complications of procedures, not elsewhere classified, initial encounter: Secondary | ICD-10-CM | POA: Diagnosis present

## 2022-01-27 DIAGNOSIS — Z888 Allergy status to other drugs, medicaments and biological substances status: Secondary | ICD-10-CM | POA: Diagnosis not present

## 2022-01-27 DIAGNOSIS — G4700 Insomnia, unspecified: Secondary | ICD-10-CM | POA: Diagnosis present

## 2022-01-27 LAB — CBC WITH DIFFERENTIAL/PLATELET
Abs Immature Granulocytes: 0.75 10*3/uL — ABNORMAL HIGH (ref 0.00–0.07)
Basophils Absolute: 0.2 10*3/uL — ABNORMAL HIGH (ref 0.0–0.1)
Basophils Relative: 1 %
Eosinophils Absolute: 1.5 10*3/uL — ABNORMAL HIGH (ref 0.0–0.5)
Eosinophils Relative: 7 %
HCT: 27.7 % — ABNORMAL LOW (ref 39.0–52.0)
Hemoglobin: 9.7 g/dL — ABNORMAL LOW (ref 13.0–17.0)
Immature Granulocytes: 4 %
Lymphocytes Relative: 27 %
Lymphs Abs: 5.8 10*3/uL — ABNORMAL HIGH (ref 0.7–4.0)
MCH: 33.8 pg (ref 26.0–34.0)
MCHC: 35 g/dL (ref 30.0–36.0)
MCV: 96.5 fL (ref 80.0–100.0)
Monocytes Absolute: 1.7 10*3/uL — ABNORMAL HIGH (ref 0.1–1.0)
Monocytes Relative: 8 %
Neutro Abs: 11.7 10*3/uL — ABNORMAL HIGH (ref 1.7–7.7)
Neutrophils Relative %: 53 %
Platelets: 518 10*3/uL — ABNORMAL HIGH (ref 150–400)
RBC: 2.87 MIL/uL — ABNORMAL LOW (ref 4.22–5.81)
RDW: 15.6 % — ABNORMAL HIGH (ref 11.5–15.5)
WBC: 21.6 10*3/uL — ABNORMAL HIGH (ref 4.0–10.5)
nRBC: 0.2 % (ref 0.0–0.2)

## 2022-01-27 LAB — BASIC METABOLIC PANEL
Anion gap: 7 (ref 5–15)
BUN: 17 mg/dL (ref 8–23)
CO2: 29 mmol/L (ref 22–32)
Calcium: 8.1 mg/dL — ABNORMAL LOW (ref 8.9–10.3)
Chloride: 97 mmol/L — ABNORMAL LOW (ref 98–111)
Creatinine, Ser: 0.97 mg/dL (ref 0.61–1.24)
GFR, Estimated: >60 mL/min (ref >60)
Glucose, Bld: 128 mg/dL — ABNORMAL HIGH (ref 70–99)
Potassium: 3.5 mmol/L (ref 3.5–5.1)
Sodium: 133 mmol/L — ABNORMAL LOW (ref 135–145)

## 2022-01-27 LAB — APTT: aPTT: 81 seconds — ABNORMAL HIGH (ref 24–36)

## 2022-01-27 LAB — HEPARIN LEVEL (UNFRACTIONATED): Heparin Unfractionated: 0.7 IU/mL (ref 0.30–0.70)

## 2022-01-27 NOTE — Progress Notes (Signed)
Subjective: ?Pleasant 76 year old male who was admitted to the hospital due to a left lower extremity DVT s/p Left tKA with Dr. Theda Sers on 01/19/2022. Went to [facility] ER on 01/25/2022 and was transferred to [facility] for treatment. When in the ED K levels were found to be 2.5. ?Patient is improving today. He is still having pain in left lower extremity, but better than yesterday.  ?Reports no events overnight.  ? ?Objective: ?Vital signs in last 24 hours: ?Temp:  [98.1 ?F (36.7 ?C)-100 ?F (37.8 ?C)] 98.1 ?F (36.7 ?C) (05/03 2014) ?Pulse Rate:  [93-100] 93 (05/03 2014) ?Resp:  [15-20] 15 (05/03 2014) ?BP: (140-157)/(81-87) 157/84 (05/03 2014) ?SpO2:  [92 %-96 %] 92 % (05/03 2014) ? ?Intake/Output from previous day: ?05/03 0701 - 05/04 0700 ?In: 450 [P.O.:300; I.V.:150] ?Out: 1600 [Urine:1600] ?Intake/Output this shift: ?Total I/O ?In: 450 [P.O.:300; I.V.:150] ?Out: 900 [Urine:900] ? ?Recent Labs  ?  01/25/22 ?1328 01/26/22 ?0450 01/27/22 ?0225  ?HGB 10.7* 9.3* 9.7*  ? ?Recent Labs  ?  01/26/22 ?0450 01/27/22 ?0225  ?WBC 19.0* 21.6*  ?RBC 2.88* 2.87*  ?HCT 28.0* 27.7*  ?PLT 505* 518*  ? ?Recent Labs  ?  01/26/22 ?0450 01/27/22 ?0225  ?NA 135 133*  ?K 3.5 3.5  ?CL 99 97*  ?CO2 29 29  ?BUN 22 17  ?CREATININE 0.96 0.97  ?GLUCOSE 112* 128*  ?CALCIUM 8.1* 8.1*  ? ?No results for input(s): LABPT, INR in the last 72 hours. ? ?General: A&Ox3, appears stated age, patient appears to be in better mood than yesterday ?Musculoskeletal: LLE: swelling improving,still tender in LLE calf area, bandages do have minnial blood on it but otherwise looks good.  Able to dorsiflex and plantar flex at left ankle with minimal pain. Palpable DP, and PT pulses. Sensation intact. Good cap refill.  ? ? ?Assessment/Plan: ?Left LE DVT s/p Left TKA: ?Leg seems to be improving at this time. Bruising does not seem to be getting worse. Swelling is decreasing. Pain is improving. It looks like leg is trending in the correct direction. Dressing looks good, leave in  place. ?Ortho will continue to follow for care of left knee. ? ? ?Drue Novel, PA-C ?With Dr. Sydnee Cabal ?[facility] ?[phone] ?01/27/2022, 6:45 AM  ? ? ? ? ?

## 2022-01-27 NOTE — Progress Notes (Addendum)
Patient ID: Joel Kim, male   DOB: December 18, 1945, 76 y.o.   MRN: [id number] ? ?Discussed patient with Dr. Theda Sers, he wants patient to start PT in hospital. Doing quad set, ankle pumps, hip strengthening and ROM that patient can tolerate. Also okay to get up and ambulate.  ? ?Elizabeth Sauer, PA-C ?[facility] ?

## 2022-01-27 NOTE — Progress Notes (Signed)
ANTICOAGULATION CONSULT NOTE  ? ?Pharmacy Consult for heparin ?Indication: DVT ? ?Allergies  ?Allergen Reactions  ? Tapentadol Rash and Other (See Comments)  ?  Breaks patient out  ? ?Nucynta   ? Paroxetine Hcl Other (See Comments)  ?  Irritability   ? Statins Other (See Comments)  ?  Muscle aches  ? Adhesive [Tape]   ?  Breaks patient out   ? Ciprofloxacin Hives  ? Mannitol Other (See Comments)  ?  Unknown  ? Reclast [Zoledronic Acid] Other (See Comments)  ?  Ended up in hospital/ high fever and   ? Water For Injection [employer], Sterile]   ? Flagyl [Metronidazole] Rash  ? Latex Rash  ?  Irritated skin  ? ? ?Patient Measurements: ?Height: '5\' 4"'$  (162.6 cm) ?Weight: 83.9 kg (185 lb) ?IBW/kg (Calculated) : 59.2 ?Heparin Dosing Weight: 77 kg ? ?Vital Signs: ?Temp: 98.1 ?F (36.7 ?C) (05/03 2014) ?Temp Source: Oral (05/03 2014) ?BP: 157/84 (05/03 2014) ?Pulse Rate: 93 (05/03 2014) ? ?Labs: ?Recent Labs  ?  01/25/22 ?1328 01/26/22 ?0450 01/26/22 ?0942 01/26/22 ?1830 01/27/22 ?0225  ?HGB 10.7* 9.3*  --   --  9.7*  ?HCT 30.7* 28.0*  --   --  27.7*  ?PLT 561* 505*  --   --  518*  ?APTT  --   --  41* 88* 81*  ?HEPARINUNFRC  --   --  >1.10*  --   --   ?CREATININE 1.08 0.96  --   --  0.97  ? ? ? ?Estimated Creatinine Clearance: 64.3 mL/min (by C-G formula based on SCr of 0.97 mg/dL). ? ? ?Medical History: ?Past Medical History:  ?Diagnosis Date  ? Asthma   ? Atypical nevus 01/26/2006  ? right side of body (slight)  ? Basal cell carcinoma 11/17/2015  ? left ear rim tx cx3 77f  ? Cancer ([facility]   ? Hypertension   ? Hypothyroidism   ? Insomnia   ? 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 [place] [place]  ? right ant. shoulder (exc)  ? Neuromuscular disorder ([facility]   ? feet  ? Pancreatitis   ? Pre-diabetes   ? [facility] ([facility]   ? Spondylosis of lumbar spine   ? Stroke ([facility] 2021  ? TIA x 2 some memory loss.  ? Thyroid disease   ? ? ?Medications:  ?Medications Prior to Admission  ?Medication Sig Dispense Refill Last Dose  ?  albuterol (PROVENTIL HFA;VENTOLIN HFA) 108 (90 Base) MCG/ACT inhaler Inhale 2 puffs into the lungs every 6 (six) hours as needed. (Patient taking differently: Inhale 2 puffs into the lungs every 6 (six) hours as needed for wheezing or shortness of breath.) 1 Inhaler 0 unknown  ? amLODipine (NORVASC) 5 MG tablet Take 1 tablet by mouth daily.   01/24/2022  ? Ascorbic Acid (VITAMIN C) 1000 MG tablet Take 1,000 mg by mouth daily.   01/24/2022  ? aspirin EC 81 MG tablet Take 1 tablet (81 mg total) by mouth 2 (two) times daily. Swallow whole. 84 tablet 0 01/24/2022  ? cetirizine (ZYRTEC) 10 MG tablet Take 10 mg by mouth at bedtime.    01/24/2022  ? Cholecalciferol (VITAMIN D3) 50 MCG (2000 UT) TABS Take 2,000 Units by mouth daily.   01/24/2022  ? [EXPIRED] doxycycline (VIBRAMYCIN) 50 MG capsule Take 1 capsule (50 mg total) by mouth 2 (two) times daily for 5 days. 10 capsule 0 01/24/2022  ? Evolocumab (REPATHA) 140 MG/ML SOSY Inject 140 mg into the  skin every 14 (fourteen) days.   unknown  ? gabapentin (NEURONTIN) 300 MG capsule Take 300 mg by mouth daily.   01/24/2022  ? hydrochlorothiazide (HYDRODIURIL) 25 MG tablet Take 25 mg by mouth daily.   01/24/2022  ? HYDROmorphone (DILAUDID) 2 MG tablet Take 1 tablet (2 mg total) by mouth every 4 (four) hours as needed for up to 7 days for severe pain. 42 tablet 0 01/25/2022 at 0730  ? levothyroxine (SYNTHROID) 100 MCG tablet Take 100 mcg by mouth daily before breakfast.   01/24/2022  ? methocarbamol (ROBAXIN) 500 MG tablet Take 1 tablet (500 mg total) by mouth every 6 (six) hours as needed for muscle spasms. 60 tablet 0 01/25/2022  ? Multiple Vitamins-Minerals (MULTIVITAMIN WITH MINERALS) tablet Take 1 tablet by mouth daily.   01/24/2022  ? mupirocin ointment (BACTROBAN) 2 % Apply 1 application. topically See admin instructions. 2-3 times daily   01/24/2022  ? ondansetron (ZOFRAN) 4 MG tablet Take 1 tablet (4 mg total) by mouth daily as needed for nausea or vomiting. 50 tablet 1 unknown  ? Oxycodone HCl  10 MG TABS Take 10 mg by mouth 4 (four) times daily.   Past Week  ? pantoprazole (PROTONIX) 40 MG tablet Take 1 tablet (40 mg total) by mouth daily. 30 tablet 3 01/24/2022  ? Probiotic Product (PROBIOTIC PO) Take by mouth.   01/24/2022  ? SUMAtriptan (IMITREX) 50 MG tablet Take 1 tablet (50 mg total) by mouth as needed. Every 2 hrs as needed 10 tablet 0 unknown  ? traZODone (DESYREL) 50 MG tablet Take 75 mg by mouth at bedtime.   01/24/2022  ? valACYclovir (VALTREX) 1000 MG tablet Take 1,000 mg by mouth daily as needed (Cold sores).     ? amLODipine (NORVASC) 5 MG tablet Take 5 mg by mouth at bedtime. (Patient not taking: Reported on 01/25/2022)   Not Taking  ? Fluorouracil (TOLAK) 4 % CREA Apply to affected area Monday-Sunday qhs x 2 weeks (Patient not taking: Reported on 12/22/2021) 40 g 1 Not Taking  ? triamcinolone cream (KENALOG) 0.1 % Apply 1 application topically daily. (Patient not taking: Reported on 01/25/2022) 15 g 0 Not Taking  ? ? ?Assessment: ?76 yo M s/p L TKR 4/26 with new L DVT. Pharmacy consulted for heparin therapy. ?He got 1 dose of LMWH 40 mg on 4/27 & was discharged on asa 81 bid to start 4/28.  ?He got 1 dose Eliquis 10 mg in ED 5/2 @ 1726 ?He got 1 dose SQ heparin 5000 units 5/3 @ 05 am ?Pt w/ ecchymosis on L leg- has been evaluated by ortho & [facility]  ?Hg 9.2 today. Preop Hg was 15.5 on 01/13/22. ?PLT elevated > 500 , preop was 224 on 01/13/22 ? ?01/27/22 ?aPTT = 81 sec (therapeutic) with heparin gtt @ 1250 units/hr ?Pt with known ecchymosis on L leg - no worsening or new events noted by RN and no bleeding ?Hgb 9.7, plts 518 ? ?Goal of Therapy:  ?Heparin level 0.3-0.7 units/ml ?aPTT 66-102 seconds ?Monitor platelets by anticoagulation protocol: Yes ?  ?Plan:  ?Continue heparin drip 1250 units/hr  ?Daily CBC, aPTT, heparin level ?F/u for transition to Eliquis ? ?Dolly Rias RPh ?01/27/2022, 3:01 AM ? ?

## 2022-01-27 NOTE — Evaluation (Addendum)
Physical Therapy Evaluation ?Patient Details ?Name: Joel Kim ?MRN: [id number] ?DOB: January 12, 1946 ?Today's Date: 01/27/2022 ? ?History of Present Illness ? Patient is 76 year old male who was admitted to the hospital due to a left lower extremity DVT s/p Left TKA with Dr. Theda Sers on 01/19/2022. Went to [facility] ER on 01/25/2022 and was transferred to [facility] for treatment. When in the ED K levels were found to be 2.5. ? ?  ?Clinical Impression ? Joel Kim is 76 y.o. male admitted with above HPI and diagnosis. Patient is currently limited by functional impairments below (see PT problem list). Patient lives with his wife and prior to TKA on 4/26 was independent at baseline. Patient now limited by pain secondary to TKA and DVT on Lt LE and is hesitant to weight bear through LT LE. After small steps at EOB pt became diaphoretic, pale, and c/o feeling hot. Returned to supine where symptoms improved and instructed pt on exercises for ROM and circulation. Patient will benefit from continued skilled PT interventions to address impairments and progress independence with mobility, recommending HHPT with transition to OPPT for knee replacement once pt safe for discharge home. Acute PT will follow and progress as able.    ?   ? ?Recommendations for follow up therapy are one component of a multi-disciplinary discharge planning process, led by the attending physician.  Recommendations may be updated based on patient status, additional functional criteria and insurance authorization. ? ?Follow Up Recommendations Follow physician's recommendations for discharge plan and follow up therapies ? ?  ?Assistance Recommended at Discharge Intermittent Supervision/Assistance  ?Patient can return home with the following ? Help with stairs or ramp for entrance;Assist for transportation;Assistance with cooking/housework ? ?  ?Equipment Recommendations Rolling walker (2 wheels)  ?Recommendations for Other Services ?    ?  ?Functional Status Assessment  Patient has had a recent decline in their functional status and demonstrates the ability to make significant improvements in function in a reasonable and predictable amount of time.  ? ?  ?Precautions / Restrictions Precautions ?Precautions: Fall;Knee ?Precaution Comments: instructed no pillow under knee ?Restrictions ?Weight Bearing Restrictions: No ?LLE Weight Bearing: Weight bearing as tolerated ?Other Position/Activity Restrictions: WBAT  ? ?  ? ?Mobility ? Bed Mobility ?Overal bed mobility: Needs Assistance ?Bed Mobility: Supine to Sit, Sit to Supine ?  ?  ?Supine to sit: Min assist ?Sit to supine: Min assist ?  ?General bed mobility comments: Assist for Lt LE to move to EOB and to bring back onto bed at EOS. ?  ? ?Transfers ?Overall transfer level: Needs assistance ?Equipment used: Rolling walker (2 wheels) ?Transfers: Sit to/from Stand ?Sit to Stand: Min guard, Min assist ?  ?  ?  ?  ?  ?General transfer comment: Patient required min assist to steady with rise but was able to initiate power up with bil UE on RW and single leg squat on Rt. ?  ? ?Ambulation/Gait ?Ambulation/Gait assistance: Min assist ?Gait Distance (Feet): 3 Feet ?Assistive device: Rolling walker (2 wheels) ?  ?  ?  ?Pre-gait activities: small side steps to move to [facility] pt also able to march Lt LE 3x. overall pt hesitant to WB on Lt LE and scooting Rt foot to advance rather than lifting off ground to step. ?  ? ?Stairs ?  ?  ?  ?  ?  ? ?Wheelchair Mobility ?  ? ?Modified Rankin (Stroke Patients Only) ?  ? ?  ? ?Balance Overall balance assessment: Needs assistance ?  Sitting-balance support: Feet supported ?Sitting balance-Leahy Scale: Good ?  ?  ?Standing balance support: Reliant on assistive device for balance, During functional activity, Bilateral upper extremity supported ?Standing balance-Leahy Scale: Poor ?  ?  ?  ?  ?  ?  ?  ?  ?  ?  ?  ?  ?   ? ? ? ?Pertinent Vitals/Pain Pain Assessment ?Pain Assessment: 0-10 ?Pain Score: 6  ?Pain  Location: left knee/leg ?Pain Descriptors / Indicators: Aching, Grimacing, Sore, Operative site guarding ?Pain Intervention(s): Limited activity within patient's tolerance, Monitored during session, Repositioned  ? ? ?Home Living Family/patient expects to be discharged to:: Private residence ?Living Arrangements: Spouse/significant other;Children ?Available Help at Discharge: Family ?Type of Home: House ?Home Access: Stairs to enter ?Entrance Stairs-Rails: Right;Left ?Entrance Stairs-Number of Steps: 3-4 ?  ?Home Layout: Multi-level;Able to live on main level with bedroom/bathroom ?Home Equipment: Conservation officer, nature (2 wheels);Other (comment) (ice machine) ?Additional Comments: has Farm in [place]  ?  ?Prior Function Prior Level of Function : Independent/Modified Independent ?  ?  ?  ?  ?  ?  ?Mobility Comments: pt reports he has been very limited since getting home from surgery ?  ?  ? ? ?Hand Dominance  ? Dominant Hand: Right ? ?  ?Extremity/Trunk Assessment  ? Upper Extremity Assessment ?Upper Extremity Assessment: Overall WFL for tasks assessed ?  ? ?Lower Extremity Assessment ?Lower Extremity Assessment: LLE deficits/detail ?LLE Deficits / Details: grossly 3/5 throughout, limited testing due to pain. Lt LE swollen and significant bruising. ?LLE Sensation: WNL ?LLE Coordination: WNL ?  ? ?Cervical / Trunk Assessment ?Cervical / Trunk Assessment: Normal  ?Communication  ? Communication: No difficulties  ?Cognition Arousal/Alertness: Awake/alert ?Behavior During Therapy: [facility] for tasks assessed/performed ?Overall Cognitive Status: Within Functional Limits for tasks assessed ?  ?  ?  ?  ?  ?  ?  ?  ?  ?  ?  ?  ?  ?  ?  ?  ?  ?  ?  ? ?  ?General Comments   ? ?  ?Exercises Total Joint Exercises ?Ankle Circles/Pumps: AROM, Both, 15 reps ?Quad Sets: AROM, Left, 10 reps ?Heel Slides: AAROM, Left, 10 reps ?Hip ABduction/ADduction: AROM, Left, 10 reps  ? ?Assessment/Plan  ?  ?PT Assessment Patient needs continued PT  services  ?PT Problem List Decreased strength;Decreased range of motion;Decreased activity tolerance;Decreased balance;Pain;Decreased knowledge of use of DME;Decreased mobility;Decreased knowledge of precautions ? ?   ?  ?PT Treatment Interventions DME instruction;Therapeutic exercise;Gait training;Functional mobility training;Therapeutic activities;Patient/family education;Stair training   ? ?PT Goals (Current goals can be found in the Care Plan section)  ?Acute Rehab PT Goals ?Patient Stated Goal: to stop hurting and get knee better ?PT Goal Formulation: With patient ?Time For Goal Achievement: 02/10/22 ?Potential to Achieve Goals: Good ? ?  ?Frequency Min 5X/week ?  ? ? ?Co-evaluation   ?  ?  ?  ?  ? ? ?  ?AM-PAC PT "6 Clicks" Mobility  ?Outcome Measure Help needed turning from your back to your side while in a flat bed without using bedrails?: A Little ?Help needed moving from lying on your back to sitting on the side of a flat bed without using bedrails?: A Little ?Help needed moving to and from a bed to a chair (including a wheelchair)?: A Little ?Help needed standing up from a chair using your arms (e.g., wheelchair or bedside chair)?: A Little ?Help needed to walk in hospital room?: A Lot ?Help  needed climbing 3-5 steps with a railing? : A Lot ?6 Click Score: 16 ? ?  ?End of Session Equipment Utilized During Treatment: Gait belt ?Activity Tolerance: Patient tolerated treatment well;Patient limited by fatigue ?Patient left: in chair;with call bell/phone within reach;with chair alarm set;with family/visitor present ?Nurse Communication: Mobility status ?PT Visit Diagnosis: Other abnormalities of gait and mobility (R26.89);Difficulty in walking, not elsewhere classified (R26.2) ?  ? ?Time: 7741-2878 ?PT Time Calculation (min) (ACUTE ONLY): 34 min ? ? ?Charges:   PT Evaluation ?$PT Eval Low Complexity: 1 Low ?PT Treatments ?$Therapeutic Exercise: 8-22 mins ?  ?   ? ? ?Gwynneth Albright PT, DPT ?Acute Rehabilitation  Services ?Office [phone] ?Pager [phone]  ? ?Jacques Navy ?01/27/2022, 2:52 PM ? ?

## 2022-01-27 NOTE — Progress Notes (Signed)
?PROGRESS NOTE ? ?Joel Kim  ?DOB: 09/09/1946  ?PCP: [name], MD ?TDD:[id number]  ?DOA: 01/25/2022 ? LOS: 0 days  ?Hospital Day: 3 ? ?Brief narrative: ?Joel Kim is a 76 y.o. male with PMH significant for HTN, TIA, hypothyroidism, asthma who had a left TKA done by on 4/26, discharged on 4/27 on aspirin 81 mg twice daily for DVT prophylaxis, doxycycline.  Per patient's wife, prior to discharge, patient had noticed swelling in his left leg and his mobility had gotten worse.  He was recommended outpatient PT which was supposed to start from this week ?4/28, patient was seen in the office again because of the concern of bleeding at the site.  Per patient's wife, his bandage was changed and he was sent back to home. ?5/2, patient presented to the [facility] ED with worsening swelling involving whole of the left lower extremity as well as multiple areas of ecchymosis. ? ?In the ED, patient was afebrile. ?Labs with sodium 134, potassium 2.5, WBC count elevated to 20, hemoglobin 10.7 ?Ultrasound duplex of left lower extremity showed an occlusive DVT involving one of the paired divisions of the left peroneal vein.  No extension of distal DVT to more proximal venous system ?EDP discussed with orthopedist ?Patient was started on Eliquis ?Admitted to hospitalist service ? ?Subjective: ?Patient was seen and examined this morning.   ?Lying on bed.  Pain improving on the regimen.  Wife at bedside.   ?No fever in last 24 hours.  WBC count remains elevated, 21,000 today  ?Remains on heparin drip. ? ?Principal Problem: ?  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[place]) ?Active Problems: ?  S/P total knee arthroplasty, left ?  Hypertension ?  Hypothyroidism ?  Hypokalemia ?  Acute anemia ?  Acute metabolic encephalopathy ?  ?Assessment and Plan: ?Acute left leg DVT ?-Presented with progressively worsening swelling of the left lower extremity after recent left TKA on 4/26.  He was placed on aspirin 81 mg twice daily at discharge which he was taking as  advised despite which swelling continued. ?-Ultrasound duplex as above showing acute DVT on the left lower extremity. ?-Currently on IV heparin drip.  Plan to switch him to Eliquis at discharge. ? ?Gross swelling or ecchymosis of whole left lower extremity ?Recent left total knee arthroplasty 4/26 ?-The swelling seems disproportionately larger than an acute DVT of peroneal vein would cause.   ?-In light of recent surgery, leukocytosis, infection still remains a consideration.  On 5/3, I started the patient empirically on IV Rocephin.  No fever, WBC count remains elevated.   ?-There is also an area of ecchymosis in the medial aspect of left thigh.  Monitor while on IV heparin. ?-Orthopedics following up. ?-For pain control, patient is currently on scheduled Robaxin, scheduled Percocet and as needed IV Dilaudid ?-PT eval ordered. ?  ?Acute metabolic encephalopathy ?-Mild drowsiness likely because of narcotics use to control pain.   ?-Improving mental status. ?-Currently on significant dose of opioids.  Continue to hold gabapentin and trazodone ?-Continue to monitor mental status  ? ?Acute anemia ?Hemoglobin 10.7, baseline about 15 and was checked 12 days ago prior to surgery.  Suspect perisurgical and postop blood loss.  ?-Monitor closely ?Recent Labs  ?  01/29/21 ?1445 01/13/22 ?1437 01/25/22 ?1328 01/26/22 ?0450 01/27/22 ?0225  ?HGB 16.6 15.5 10.7* 9.3* 9.7*  ?MCV 92.3 90.4 93.3 97.2 96.5  ? ?Recent Labs  ?Lab 01/25/22 ?1328 01/26/22 ?0450 01/27/22 ?0225  ?K 2.5* 3.5 3.5  ?MG 2.1  --   --   ? ?  Hypothyroidism ?-Resume Synthroid ?  ?Hypertension ?-Blood pressure stable. ?-Continue HCTZ 25 mg daily and Norvasc 5 mg daily. ? ?Hyperlipidemia ?-Repatha as an outpatient ? ?Goals of care ?  Code Status: Full Code  ? ? ?Mobility: PT eval probably starting tomorrow ? ?Skin assessment:  ?  ? ?Nutritional status:  ?Body mass index is 31.76 kg/m?.  ?  ?  ? ? ? ? ?Diet:  ?Diet Order   ? ?       ?  Diet Heart Room service  appropriate? Yes; Fluid consistency: Thin  Diet effective now       ?  ? ?  ?  ? ?  ? ? ?DVT prophylaxis:  ? ?  ?Antimicrobials: IV Rocephin empirically ?Fluid: None ?Consultants: Orthopedics ?Family Communication: Wife at bedside ? ?Status is: Observation ? ?Continue in-hospital care because: Acute DVT, gross swelling of left lower extremity, significant pain ?Level of care: Telemetry Medical  ? ?Dispo: The patient is from: Home ?             Anticipated d/c is to: Pending clinical course ?             Patient currently is not medically stable to d/c. ?  Difficult to place patient No ? ? ? ? ?Infusions:  ? cefTRIAXone (ROCEPHIN)  IV 2 g (01/27/22 1153)  ? heparin 1,250 Units/hr (01/27/22 0526)  ? ? ?Scheduled Meds: ? amLODipine  5 mg Oral Daily  ? hydrochlorothiazide  25 mg Oral Daily  ? HYDROcodone-acetaminophen  1 tablet Oral Q6H  ? levothyroxine  100 mcg Oral QAC breakfast  ? methocarbamol  500 mg Oral Q6H  ? pantoprazole  40 mg Oral Daily  ? ? ?PRN meds: ?acetaminophen **OR** acetaminophen, HYDROmorphone (DILAUDID) injection, ondansetron **OR** ondansetron (ZOFRAN) IV, polyethylene glycol  ? ?Antimicrobials: ?Anti-infectives (From admission, onward)  ? ? Start     Dose/Rate Route Frequency Ordered Stop  ? 01/26/22 1200  cefTRIAXone (ROCEPHIN) 2 g in sodium chloride 0.9 % 100 mL IVPB       ? 2 g ?200 mL/hr over 30 Minutes Intravenous Every 24 hours 01/26/22 1001    ? 01/25/22 2200  doxycycline (VIBRAMYCIN) 50 MG capsule 50 mg  Status:  Discontinued       ? 50 mg Oral 2 times daily 01/25/22 1940 01/25/22 2112  ? 01/25/22 2200  doxycycline (VIBRA-TABS) tablet 50 mg       ? 50 mg Oral Every 12 hours 01/25/22 2112 01/26/22 0925  ? ?  ? ? ?Objective: ?Vitals:  ? 01/26/22 1417 01/26/22 2014  ?BP: 140/81 (!) 157/84  ?Pulse: 94 93  ?Resp: 20 15  ?Temp: 98.6 ?F (37 ?C) 98.1 ?F (36.7 ?C)  ?SpO2: 96% 92%  ? ? ?Intake/Output Summary (Last 24 hours) at 01/27/2022 1335 ?Last data filed at 01/27/2022 1100 ?Gross per 24 hour   ?Intake 690 ml  ?Output 2250 ml  ?Net -1560 ml  ? ?Filed Weights  ? 01/25/22 1238  ?Weight: 83.9 kg  ? ?Weight change:  ?Body mass index is 31.76 kg/m?.  ? ?Physical Exam: ?General exam: Pleasant, elderly Caucasian male.  Pain controlled at rest ?Skin: No rashes, lesions or ulcers. ?HEENT: Atraumatic, normocephalic, no obvious bleeding ?Lungs: Clear to auscultation bilaterally ?CVS: Regular rate and rhythm, no murmur ?GI/Abd soft, nontender, nondistended, bowel present ?CNS: Alert, awake, oriented x3 ?Psychiatry: Mood appropriate ?Extremities: No pedal edema, no calf tenderness on the right, left leg swollen from foot to thigh.  An area of  ecchymosis on the medial aspect of thigh.  Surgical incision site in left knee is bandaged. ? ?Data Review: I have personally reviewed the laboratory data and studies available. ? ?F/u labs ordered ?Unresulted Labs (From admission, onward)  ? ?  Start     Ordered  ? 01/28/22 0500  APTT  Daily,   R     ?Question:  Specimen collection method  Answer:  Lab=Lab collect  ? 01/27/22 0302  ? 01/28/22 0500  Heparin level (unfractionated)  Daily,   R     ?Question:  Specimen collection method  Answer:  Lab=Lab collect  ? 01/27/22 0302  ? 01/27/22 0500  CBC with Differential/Platelet  Daily,   R     ?Question:  Specimen collection method  Answer:  Lab=Lab collect  ? 01/26/22 1256  ? 01/27/22 1610  Basic metabolic panel  Daily,   R     ?Question:  Specimen collection method  Answer:  Lab=Lab collect  ? 01/26/22 1256  ? ?  ?  ? ?  ? ? ?Signed, ?Terrilee Croak, MD ?[facility] ?01/27/2022 ? ? ? ? ? ? ? ? ? ? ? ? ?

## 2022-01-28 ENCOUNTER — Ambulatory Visit: Payer: Medicare HMO | Admitting: Physical Therapy

## 2022-01-28 DIAGNOSIS — I82452 Acute embolism and thrombosis of left peroneal vein: Secondary | ICD-10-CM | POA: Diagnosis not present

## 2022-01-28 LAB — BASIC METABOLIC PANEL
Anion gap: 9 (ref 5–15)
BUN: 16 mg/dL (ref 8–23)
CO2: 31 mmol/L (ref 22–32)
Calcium: 8.7 mg/dL — ABNORMAL LOW (ref 8.9–10.3)
Chloride: 95 mmol/L — ABNORMAL LOW (ref 98–111)
Creatinine, Ser: 0.87 mg/dL (ref 0.61–1.24)
GFR, Estimated: >60 mL/min (ref >60)
Glucose, Bld: 130 mg/dL — ABNORMAL HIGH (ref 70–99)
Potassium: 3.4 mmol/L — ABNORMAL LOW (ref 3.5–5.1)
Sodium: 135 mmol/L (ref 135–145)

## 2022-01-28 LAB — CBC WITH DIFFERENTIAL/PLATELET
Abs Immature Granulocytes: 0.92 10*3/uL — ABNORMAL HIGH (ref 0.00–0.07)
Basophils Absolute: 0.2 10*3/uL — ABNORMAL HIGH (ref 0.0–0.1)
Basophils Relative: 1 %
Eosinophils Absolute: 1.3 10*3/uL — ABNORMAL HIGH (ref 0.0–0.5)
Eosinophils Relative: 7 %
HCT: 29.7 % — ABNORMAL LOW (ref 39.0–52.0)
Hemoglobin: 9.9 g/dL — ABNORMAL LOW (ref 13.0–17.0)
Immature Granulocytes: 5 %
Lymphocytes Relative: 25 %
Lymphs Abs: 4.5 10*3/uL — ABNORMAL HIGH (ref 0.7–4.0)
MCH: 32.5 pg (ref 26.0–34.0)
MCHC: 33.3 g/dL (ref 30.0–36.0)
MCV: 97.4 fL (ref 80.0–100.0)
Monocytes Absolute: 1.3 10*3/uL — ABNORMAL HIGH (ref 0.1–1.0)
Monocytes Relative: 7 %
Neutro Abs: 10 10*3/uL — ABNORMAL HIGH (ref 1.7–7.7)
Neutrophils Relative %: 55 %
Platelets: 513 10*3/uL — ABNORMAL HIGH (ref 150–400)
RBC: 3.05 MIL/uL — ABNORMAL LOW (ref 4.22–5.81)
RDW: 16.4 % — ABNORMAL HIGH (ref 11.5–15.5)
WBC: 18.2 10*3/uL — ABNORMAL HIGH (ref 4.0–10.5)
nRBC: 0.4 % — ABNORMAL HIGH (ref 0.0–0.2)

## 2022-01-28 LAB — HEPARIN LEVEL (UNFRACTIONATED)
Heparin Unfractionated: 0.24 IU/mL — ABNORMAL LOW (ref 0.30–0.70)
Heparin Unfractionated: 0.27 IU/mL — ABNORMAL LOW (ref 0.30–0.70)

## 2022-01-28 LAB — APTT
aPTT: 68 seconds — ABNORMAL HIGH (ref 24–36)
aPTT: 67 seconds — ABNORMAL HIGH (ref 24–36)

## 2022-01-28 MED ORDER — NIRMATRELVIR/RITONAVIR (PAXLOVID)TABLET
3.0000 | ORAL_TABLET | Freq: Two times a day (BID) | ORAL | Status: DC
  Filled 2022-01-28: qty 30

## 2022-01-28 MED ORDER — HYDROXYZINE HCL 10 MG PO TABS
10.0000 mg | ORAL_TABLET | Freq: Three times a day (TID) | ORAL | Status: DC | PRN
  Administered 2022-01-28 – 2022-01-29 (×2): 10 mg via ORAL
  Filled 2022-01-28 (×3): qty 1

## 2022-01-28 NOTE — Progress Notes (Signed)
Patient ID: Joel Kim, male   DOB: 04-03-1946, 76 y.o.   MRN: [id number] ?Patient seen this afternoon after discussion with his hospitalist and evaluated the patient with his wife present. ?Patient seems to be having pain in the left lower extremity which is more than usual however due to his history of chronic pain may be secondary to that which accounts for the increased narcotic use. ?In addition, his postoperative DVT has complicated his recovery increased his pain and slow down his progress. ?The swelling and bruising of the left lower extremity does not appear to be excess compared to all others.  It is more than average but not unseen postoperatively 9 days from surgery.  I believe the require anticoagulation has caused increased bruising and subcutaneous bleeding which is increased the pain.  His white count is slightly lower today his Tmax of 100.4 is noted. ?I do not see any evidence of septic arthritis of his left lower extremity, knee, nor what I would call active cellulitis.  Would continue current treatment including immobilization which I think will help with the low-grade fever if there is a component of atelectasis with this. ?I changed his dressing check the wound minimal bloody drainage no purulence.  I do not see a reason to aspirate his knee at this time. ?Would continue to continue current care May consider urinalysis and possibly chest x-ray if low-grade temp persist. ?Encouraged and answered all questions for Mr. and Mrs. [name] . ?We will continue to have my partners see over the weekend and monitor.  I do believe he is going to be slower than average to mobilize may need some home care or other.  Appreciate everyone's help taking care of [name]. ?

## 2022-01-28 NOTE — Progress Notes (Signed)
?PROGRESS NOTE ? ? ?Joel Kim  TFT:[id number] DOB: 1946/09/20 DOA: 01/25/2022 ?PCP: [name], MD  ?Brief Narrative:  ? ?65 y WM known asthma HTN hypothyroid ?Prior CBD stone related pancreatitis 2019 ?Chronic pain syndrome ?Prior migraine versus TIA 2012, OSA ?Recent total knee repair Dr. Theda Sers 4/26-- ?4/28 bleeding concerns and inner thigh subcu bleeding and also had excruciating pain requiring double dose of Dilaudid--had been seen in the office 4/28 by Leroy Sea Dixon-Rx doxycycline at that time and placed on stronger pain meds as above ?WBC 20 potassium 2.5 ?5/2-admitted again from Ortho office secondary to concern of bleeding at the site with worsening swelling ? ?Hospital-Problem based course ? ?Acute left lower extremity DVT in the setting of recent total knee repair 01/19/2022 ?swelling pain likely secondary to acute DVT continue heparin at this time ?Appreciate Dr. Theda Sers input  ?Aspirin has been held from admit ?recent TKR 4/26-?  Concern for infection ?Wound per Dr. Theda Sers seems appropriately swollen-leukocytosis may be expected with the ecchymosis ?Continue ceftriaxone at this time  ?as is eating and drinking saline lock fluids ?Will complete 5 days total and then stop antibiotics 5/6. ?Acute metabolic encephalopathy on admission  ?gabapentin, trazodone held from admission continue ?Continue Norco 1 tab every 6 hourly scheduled, Dilaudid 1 mg severe pain every 3 continue Robaxin 500 every 6 ?HTN ?Continue amlodipine 5, HCTZ 25-pressures uncontrolled titrate meds in the a.m. ?Reflux-pantoprazole 40 daily ?Migraine-Imitrex held for management ?Hypothyroid-100 micrograms daily, TSH outpatient ? ? ?DVT prophylaxis: heparin ?Code Status: Full ?Family Communication: wife at bedside ?Disposition:  ?Status is: Inpatient ?Remains inpatient appropriate because:  ? ?Needs a drop in WBC and decrease in pain ?  ?Consultants:  ?Dr. Theda Sers Ortho ? ?Procedures:  ? ?Antimicrobials:   ? ? ?Subjective: ?Awake  coherent in nad no focal deficit ?In some pain ?No n/v ? ?Objective: ?Vitals:  ? 01/26/22 2014 01/27/22 1406 01/27/22 2017 01/28/22 0424  ?BP: (!) 157/84 (!) 150/84 (!) 146/78 (!) 158/78  ?Pulse: 93 76 95 94  ?Resp: '15 16 16 16  '$ ?Temp: 98.1 ?F (36.7 ?C) 98.3 ?F (36.8 ?C) 97.9 ?F (36.6 ?C) (!) 100.4 ?F (38 ?C)  ?TempSrc: Oral Oral Oral Oral  ?SpO2: 92% 95% 96% 95%  ?Weight:      ?Height:      ? ? ?Intake/Output Summary (Last 24 hours) at 01/28/2022 1241 ?Last data filed at 01/28/2022 1155 ?Gross per 24 hour  ?Intake 560 ml  ?Output 1900 ml  ?Net -1340 ml  ? ?Filed Weights  ? 01/25/22 1238  ?Weight: 83.9 kg  ? ? ?Examination: ? ?Awake coherent no distress EOMI NCAT slightly sleepy but arousable ?S1-S2 no murmur no rub no gallop ?Chest is clear no rales rhonchi no wheeze ?Abdomen is soft ?Left leg is swollen with plaster and bandage over the knee with ecchymoses in addition ? ?Data Reviewed: personally reviewed  ? ?CBC ?   ?Component Value Date/Time  ? WBC 18.2 (H) 01/28/2022 0516  ? RBC 3.05 (L) 01/28/2022 0516  ? HGB 9.9 (L) 01/28/2022 0516  ? HCT 29.7 (L) 01/28/2022 0516  ? PLT 513 (H) 01/28/2022 0516  ? MCV 97.4 01/28/2022 0516  ? MCH 32.5 01/28/2022 0516  ? MCHC 33.3 01/28/2022 0516  ? RDW 16.4 (H) 01/28/2022 0516  ? LYMPHSABS 4.5 (H) 01/28/2022 0516  ? MONOABS 1.3 (H) 01/28/2022 0516  ? EOSABS 1.3 (H) 01/28/2022 0516  ? BASOSABS 0.2 (H) 01/28/2022 0516  ? ? ?  Latest Ref Rng & Units 01/28/2022  ?  5:16 AM 01/27/2022  ?  2:25 AM 01/26/2022  ?  4:50 AM  ?CMP  ?Glucose 70 - 99 mg/dL 130   128   112    ?BUN 8 - 23 mg/dL '16   17   22    '$ ?Creatinine 0.61 - 1.24 mg/dL 0.87   0.97   0.96    ?Sodium 135 - 145 mmol/L 135   133   135    ?Potassium 3.5 - 5.1 mmol/L 3.4   3.5   3.5    ?Chloride 98 - 111 mmol/L 95   97   99    ?CO2 22 - 32 mmol/L '31   29   29    '$ ?Calcium 8.9 - 10.3 mg/dL 8.7   8.1   8.1    ? ? ? ?Radiology Studies: ?No results found. ? ? ?Scheduled Meds: ? amLODipine  5 mg Oral Daily  ? hydrochlorothiazide  25 mg Oral  Daily  ? HYDROcodone-acetaminophen  1 tablet Oral Q6H  ? levothyroxine  100 mcg Oral QAC breakfast  ? methocarbamol  500 mg Oral Q6H  ? pantoprazole  40 mg Oral Daily  ? ?Continuous Infusions: ? cefTRIAXone (ROCEPHIN)  IV Stopped (01/27/22 1223)  ? heparin 1,400 Units/hr (01/28/22 0615)  ? ? ? LOS: 1 day  ? ?Time spent: 40 ? ?Nita Sells, MD ?[facility] ?To contact the attending provider between 7A-7P or the covering provider during after hours 7P-7A, please log into the web site [email or web address] and access using universal [employer] password for that web site. If you do not have the password, please call the hospital operator. ? ?01/28/2022, 12:41 PM  ? ? ?

## 2022-01-28 NOTE — Plan of Care (Signed)

## 2022-01-28 NOTE — Plan of Care (Signed)
  Problem: Education: Goal: Knowledge of General Education information will improve Description: Including pain rating scale, medication(s)/side effects and non-pharmacologic comfort measures Outcome: Progressing   Problem: Activity: Goal: Risk for activity intolerance will decrease Outcome: Progressing   Problem: Pain Managment: Goal: General experience of comfort will improve Outcome: Progressing   

## 2022-01-28 NOTE — Progress Notes (Signed)
Physical Therapy Treatment ?Patient Details ?Name: Joel Kim ?MRN: [id number] ?DOB: 10-07-1945 ?Today's Date: 01/28/2022 ? ? 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 Theda Sers on 01/19/2022. Went to [facility] ER on 01/25/2022 and was transferred to [facility] for treatment. When in the ED K levels were found to be 2.5. ? ?  ?PT Comments  ? ? Pt limited to ROM to L LE due to significant pain and swelling. Discussed POC with pt and wife.  Pt may benefit from short term rehab stay prior to returning home. Functionally, pt was able to transfer supine to sit with assistance to guide L LE. Good unsupported sitting balance without dizziness. ?Pt stood from bed with Min Guard and was able to pivot to bedside chair.  Ice applied to L knee, elevated, with towel roll under heel to facilitate extension.  ?Continue PT per POC. ?  ?Recommendations for follow up therapy are one component of a multi-disciplinary discharge planning process, led by the attending physician.  Recommendations may be updated based on patient status, additional functional criteria and insurance authorization. ? ?Follow Up Recommendations ? Follow physician's recommendations for discharge plan and follow up therapies ?  ?  ?Assistance Recommended at Discharge Intermittent Supervision/Assistance  ?Patient can return home with the following Help with stairs or ramp for entrance;Assist for transportation;Assistance with cooking/housework ?  ?Equipment Recommendations ? Rolling walker (2 wheels)  ?  ?Recommendations for Other Services   ? ? ?  ?Precautions / Restrictions Precautions ?Precautions: Fall;Knee ?Precaution Comments: instructed no pillow under knee ?Restrictions ?Weight Bearing Restrictions: No ?LLE Weight Bearing: Weight bearing as tolerated  ?  ? ?Mobility ? Bed Mobility ?Overal bed mobility: Needs Assistance ?Bed Mobility: Supine to Sit, Sit to Supine ?  ?   ?Supine to sit: Min assist ?Sit to supine: Min assist ?  ?General bed mobility comments:  (Primary assist to support L LE) ?  ? ?Transfers ?Overall transfer level: Needs assistance ?Equipment used: Rolling walker (2 wheels) ?Transfers: Sit to/from Stand ?Sit to Stand: Min guard, Min assist ?  ?  ?  ?  ?  ?General transfer comment: Patient required min assist to steady with rise but was able to initiate power up with bil UE on RW and single leg squat on Rt. ?  ? ?Ambulation/Gait ?  ?  ?  ?  ?  ?  ?  ?  ? ? ?Stairs ?  ?  ?  ?  ?  ? ? ?Wheelchair Mobility ?  ? ?Modified Rankin (Stroke Patients Only) ?  ? ? ?  ?Balance   ?  ?  ?  ?  ?  ?  ?  ?  ?  ?  ?  ?  ?  ?  ?  ?  ?  ?  ?  ? ?  ?Cognition Arousal/Alertness: Awake/alert ?Behavior During Therapy: [facility] for tasks assessed/performed ?Overall Cognitive Status: Within Functional Limits for tasks assessed ?  ?  ?  ?  ?  ?  ?  ?  ?  ?  ?  ?  ?  ?  ?  ?  ?General Comments: AxO x 3 farmer ?  ?  ? ?  ?Exercises Total Joint Exercises ?Ankle Circles/Pumps: AROM, Both, 15 reps ?Quad Sets: AROM, Left, 10 reps ? ?  ?General Comments General comments (skin integrity, edema, etc.): Pt on Heparin drip  during session, dressing in tact, moderate edema and bruising throughout the L Leg ?  ?  ? ?Pertinent Vitals/Pain Pain Assessment ?Pain Assessment: 0-10 ?Pain Score: 6  ?Pain Location: left knee/leg ?Pain Descriptors / Indicators: Aching, Grimacing, Sore, Operative site guarding ?Pain Intervention(s): Monitored during session, Ice applied  ? ? ?Home Living   ?  ?  ?  ?  ?  ?  ?  ?  ?  ?   ?  ?Prior Function    ?  ?  ?   ? ?PT Goals (current goals can now be found in the care plan section) Acute Rehab PT Goals ?Patient Stated Goal: to stop hurting and get knee better ? ?  ?Frequency ? ? ? Min 5X/week ? ? ? ?  ?PT Plan Current plan remains appropriate  ? ? ?Co-evaluation   ?  ?  ?  ?  ? ?  ?AM-PAC PT "6 Clicks" Mobility   ?Outcome Measure ? Help needed turning from your back to your side  while in a flat bed without using bedrails?: A Little ?Help needed moving from lying on your back to sitting on the side of a flat bed without using bedrails?: A Little ?Help needed moving to and from a bed to a chair (including a wheelchair)?: A Little ?Help needed standing up from a chair using your arms (e.g., wheelchair or bedside chair)?: A Little ?Help needed to walk in hospital room?: A Lot ?Help needed climbing 3-5 steps with a railing? : A Lot ?6 Click Score: 16 ? ?  ?End of Session Equipment Utilized During Treatment: Gait belt ?Activity Tolerance: Patient limited by pain ?Patient left: in chair;with call bell/phone within reach;with chair alarm set;with family/visitor present ?Nurse Communication: Mobility status ?PT Visit Diagnosis: Other abnormalities of gait and mobility (R26.89);Difficulty in walking, not elsewhere classified (R26.2) ?  ? ? ?Time: 6295-2841 ?PT Time Calculation (min) (ACUTE ONLY): 32 min ? ?Charges:  $Therapeutic Exercise: 8-22 mins ?$Therapeutic Activity: 8-22 mins          ?          ?Mikel Cella, PTA ? ? ? ?Josie Dixon ?01/28/2022, 3:53 PM ? ?

## 2022-01-28 NOTE — Progress Notes (Addendum)
ANTICOAGULATION CONSULT NOTE  ? ?Pharmacy Consult for heparin ?Indication: DVT ? ?Allergies  ?Allergen Reactions  ? Tapentadol Rash and Other (See Comments)  ?  Breaks patient out  ? ?Nucynta   ? Paroxetine Hcl Other (See Comments)  ?  Irritability   ? Statins Other (See Comments)  ?  Muscle aches  ? Adhesive [Tape]   ?  Breaks patient out   ? Ciprofloxacin Hives  ? Mannitol Other (See Comments)  ?  Unknown  ? Reclast [Zoledronic Acid] Other (See Comments)  ?  Ended up in hospital/ high fever and   ? Water For Injection [employer], Sterile]   ? Flagyl [Metronidazole] Rash  ? Latex Rash  ?  Irritated skin  ? ? ?Patient Measurements: ?Height: '5\' 4"'$  (162.6 cm) ?Weight: 83.9 kg (185 lb) ?IBW/kg (Calculated) : 59.2 ?Heparin Dosing Weight: 77 kg ? ?Vital Signs: ?Temp: 100.4 ?F (38 ?C) (05/05 0424) ?Temp Source: Oral (05/05 0424) ?BP: 158/78 (05/05 0424) ?Pulse Rate: 94 (05/05 0424) ? ?Labs: ?Recent Labs  ?  01/25/22 ?1328 01/26/22 ?0450 01/26/22 ?0942 01/26/22 ?0942 01/26/22 ?1830 01/27/22 ?0225 01/28/22 ?1962  ?HGB 10.7* 9.3*  --   --   --  9.7* 9.9*  ?HCT 30.7* 28.0*  --   --   --  27.7* 29.7*  ?PLT 561* 505*  --   --   --  518* 513*  ?APTT  --   --  41*   < > 88* 81* 68*  ?HEPARINUNFRC  --   --  >1.10*  --   --  0.70 0.24*  ?CREATININE 1.08 0.96  --   --   --  0.97  --   ? < > = values in this interval not displayed.  ? ? ? ?Estimated Creatinine Clearance: 64.3 mL/min (by C-G formula based on SCr of 0.97 mg/dL). ? ? ?Medical History: ?Past Medical History:  ?Diagnosis Date  ? Asthma   ? Atypical nevus 01/26/2006  ? right side of body (slight)  ? Basal cell carcinoma 11/17/2015  ? left ear rim tx cx3 16f  ? [facility] ([facility]   ? Hypertension   ? Hypothyroidism   ? Insomnia   ? 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 Melanoma ([facility] 10/10/2014  ? right ant. shoulder (exc)  ? [facility]rom[place] ([facility]   ? feet  ? Pancreatitis   ? Pre-diabetes   ? Prostate CA ([facility]   ? Spondylosis of lumbar spine   ? Stroke ([facility] 2021  ? TIA x 2  some memory loss.  ? Thyroid disease   ? ? ?Medications:  ?Medications Prior to Admission  ?Medication Sig Dispense Refill Last Dose  ? albuterol (PROVENTIL HFA;VENTOLIN HFA) 108 (90 Base) MCG/ACT inhaler Inhale 2 puffs into the lungs every 6 (six) hours as needed. (Patient taking differently: Inhale 2 puffs into the lungs every 6 (six) hours as needed for wheezing or shortness of breath.) 1 Inhaler 0 unknown  ? amLODipine (NORVASC) 5 MG tablet Take 1 tablet by mouth daily.   01/24/2022  ? Ascorbic Acid (VITAMIN C) 1000 MG tablet Take 1,000 mg by mouth daily.   01/24/2022  ? aspirin EC 81 MG tablet Take 1 tablet (81 mg total) by mouth 2 (two) times daily. Swallow whole. 84 tablet 0 01/24/2022  ? cetirizine (ZYRTEC) 10 MG tablet Take 10 mg by mouth at bedtime.    01/24/2022  ? Cholecalciferol (VITAMIN D3) 50 MCG (2000 UT) TABS Take 2,000 Units by mouth daily.  01/24/2022  ? [EXPIRED] doxycycline (VIBRAMYCIN) 50 MG capsule Take 1 capsule (50 mg total) by mouth 2 (two) times daily for 5 days. 10 capsule 0 01/24/2022  ? Evolocumab (REPATHA) 140 MG/ML SOSY Inject 140 mg into the skin every 14 (fourteen) days.   unknown  ? gabapentin (NEURONTIN) 300 MG capsule Take 300 mg by mouth daily.   01/24/2022  ? hydrochlorothiazide (HYDRODIURIL) 25 MG tablet Take 25 mg by mouth daily.   01/24/2022  ? [EXPIRED] HYDROmorphone (DILAUDID) 2 MG tablet Take 1 tablet (2 mg total) by mouth every 4 (four) hours as needed for up to 7 days for severe pain. 42 tablet 0 01/25/2022 at 0730  ? levothyroxine (SYNTHROID) 100 MCG tablet Take 100 mcg by mouth daily before breakfast.   01/24/2022  ? methocarbamol (ROBAXIN) 500 MG tablet Take 1 tablet (500 mg total) by mouth every 6 (six) hours as needed for muscle spasms. 60 tablet 0 01/25/2022  ? Multiple Vitamins-Minerals (MULTIVITAMIN WITH MINERALS) tablet Take 1 tablet by mouth daily.   01/24/2022  ? mupirocin ointment (BACTROBAN) 2 % Apply 1 application. topically See admin instructions. 2-3 times daily   01/24/2022  ?  ondansetron (ZOFRAN) 4 MG tablet Take 1 tablet (4 mg total) by mouth daily as needed for nausea or vomiting. 50 tablet 1 unknown  ? Oxycodone HCl 10 MG TABS Take 10 mg by mouth 4 (four) times daily.   Past Week  ? pantoprazole (PROTONIX) 40 MG tablet Take 1 tablet (40 mg total) by mouth daily. 30 tablet 3 01/24/2022  ? Probiotic Product (PROBIOTIC PO) Take by mouth.   01/24/2022  ? SUMAtriptan (IMITREX) 50 MG tablet Take 1 tablet (50 mg total) by mouth as needed. Every 2 hrs as needed 10 tablet 0 unknown  ? traZODone (DESYREL) 50 MG tablet Take 75 mg by mouth at bedtime.   01/24/2022  ? valACYclovir (VALTREX) 1000 MG tablet Take 1,000 mg by mouth daily as needed (Cold sores).     ? amLODipine (NORVASC) 5 MG tablet Take 5 mg by mouth at bedtime. (Patient not taking: Reported on 01/25/2022)   Not Taking  ? Fluorouracil (TOLAK) 4 % CREA Apply to affected area Monday-Sunday qhs x 2 weeks (Patient not taking: Reported on 12/22/2021) 40 g 1 Not Taking  ? triamcinolone cream (KENALOG) 0.1 % Apply 1 application topically daily. (Patient not taking: Reported on 01/25/2022) 15 g 0 Not Taking  ? ? ?Assessment: ?76 yo M s/p L TKR 4/26 with new L DVT. Pharmacy consulted for heparin therapy. ?He got 1 dose of LMWH 40 mg on 4/27 & was discharged on asa 81 bid to start 4/28.  ?He got 1 dose Eliquis 10 mg in ED 5/2 @ 1726 ?He got 1 dose SQ heparin 5000 units 5/3 @ 05 am ?Pt w/ ecchymosis on L leg- has been evaluated by ortho & TRH  ?Hg 9.2 today. Preop Hg was 15.5 on 01/13/22. ?PLT elevated > 500 , preop was 224 on 01/13/22 ? ?01/28/22 ?HL 0.24 subtherapeutic on 1250 units/hr ?Pt with known ecchymosis on L leg - no worsening or new events noted by RN and no bleeding ?Hgb 9.9, plts 513 ? ?Goal of Therapy:  ?Heparin level 0.3-0.7 units/ml ?aPTT 66-102 seconds ?Monitor platelets by anticoagulation protocol: Yes ?  ?Plan:  ?Increase heparin drip 1400 units/hr  ?Heparin level in 8 hours ?Daily CBC, aPTT, heparin level ?F/u for transition to  Eliquis ? ?Dolly Rias RPh ?01/28/2022, 6:06 AM ? ?

## 2022-01-28 NOTE — Progress Notes (Signed)
ANTICOAGULATION CONSULT NOTE  ? ?Pharmacy Consult for heparin ?Indication: DVT ? ?Allergies  ?Allergen Reactions  ? Tapentadol Rash and Other (See Comments)  ?  Breaks patient out  ? ?Nucynta   ? Paroxetine Hcl Other (See Comments)  ?  Irritability   ? Statins Other (See Comments)  ?  Muscle aches  ? Adhesive [Tape]   ?  Breaks patient out   ? Ciprofloxacin Hives  ? Mannitol Other (See Comments)  ?  Unknown  ? Reclast [Zoledronic Acid] Other (See Comments)  ?  Ended up in hospital/ high fever and   ? Water For Injection [employer], Sterile]   ? Flagyl [Metronidazole] Rash  ? Latex Rash  ?  Irritated skin  ? ? ?Patient Measurements: ?Height: '5\' 4"'$  (162.6 cm) ?Weight: 83.9 kg (185 lb) ?IBW/kg (Calculated) : 59.2 ?Heparin Dosing Weight: 77 kg ? ?Vital Signs: ?Temp: 98.9 ?F (37.2 ?C) (05/05 1335) ?Temp Source: Oral (05/05 1335) ?BP: 149/73 (05/05 1335) ?Pulse Rate: 92 (05/05 1335) ? ?Labs: ?Recent Labs  ?  01/26/22 ?0450 01/26/22 ?0942 01/27/22 ?0225 01/28/22 ?7517 01/28/22 ?1450  ?HGB 9.3*  --  9.7* 9.9*  --   ?HCT 28.0*  --  27.7* 29.7*  --   ?PLT 505*  --  518* 513*  --   ?APTT  --    < > 81* 68* 67*  ?HEPARINUNFRC  --    < > 0.70 0.24* 0.27*  ?CREATININE 0.96  --  0.97 0.87  --   ? < > = values in this interval not displayed.  ? ? ? ?Estimated Creatinine Clearance: 71.7 mL/min (by C-G formula based on SCr of 0.87 mg/dL). ? ? ?Medical History: ?Past Medical History:  ?Diagnosis Date  ? Asthma   ? Atypical nevus 01/26/2006  ? right side of body (slight)  ? Basal cell carcinoma 11/17/2015  ? left ear rim tx cx3 60f  ? Cancer ([facility]   ? Hypertension   ? Hypothyroidism   ? Insomnia   ? 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 [place] [place]/10/[place]  ? right ant. shoulder (exc)  ? [place]   ? feet  ? Pancreatitis   ? Pre-diabetes   ? [facility]state CA ([facility]   ? Spondylosis of lumbar spine   ? Stroke ([facility] 2021  ? TIA x 2 some memory loss.  ? Thyroid disease   ? ? ?Assessment: ?76yo M s/p L TKA 4/26 with new L  DVT. Pharmacy consulted for IV heparin dosing on 5/3. ?He got 1 dose of LMWH 40 mg on 4/27 & was discharged on ASA '81mg'$  BID to start 4/28.  ?He got 1 dose Eliquis 10 mg in ED 5/2 @ 1726 ?He got 1 dose SQ heparin 5000 units 5/3 @ 00017?Pt w/ ecchymosis on L leg- has been evaluated by ortho & TRH  ?Hgb 9.2 on 5/3. Pre-op Hgb was 15.5 on 4/20. ?Pltc elevated > 500K, pre-op was 224K on 4/20.  ? ?Today, 01/28/22: ?Heparin level = 0.27 units/mL, subtherapeutic on heparin infuison at 1400 units/hr ?aPTT = 67 seconds, on lower end of goal range ?Per ortho, the required anticoagulation has caused increased bruising and subcutaneous bleeding. Dressing change/wound with minimal bloody drainage.  ?No infusion issues noted per nursing ?Hgb low/stable at 9.9, Pltc remains elevated at 513K ? ?Goal of Therapy:  ?Heparin level 0.3-0.7 units/ml ?aPTT 66-102 seconds ?Monitor platelets by anticoagulation protocol: Yes ?  ?Plan:  ?Increase heparin infusion to 1550 units/hr  ?Heparin  level and aPTT now correlating, so will monitor with heparin levels only moving forward ?Heparin level 8 hours after rate change ?Daily CBC, heparin level ?Monitor closely for s/sx of worsening bleeding ? ? ?Lindell Spar, PharmD, BCPS ?Clinical Pharmacist  ?01/28/2022, 4:45 PM ? ?

## 2022-01-29 DIAGNOSIS — I82452 Acute embolism and thrombosis of left peroneal vein: Secondary | ICD-10-CM | POA: Diagnosis not present

## 2022-01-29 LAB — CBC WITH DIFFERENTIAL/PLATELET
Abs Immature Granulocytes: 0.8 10*3/uL — ABNORMAL HIGH (ref 0.00–0.07)
Basophils Absolute: 0.1 10*3/uL (ref 0.0–0.1)
Basophils Relative: 1 %
Eosinophils Absolute: 1.2 10*3/uL — ABNORMAL HIGH (ref 0.0–0.5)
Eosinophils Relative: 7 %
HCT: 31.9 % — ABNORMAL LOW (ref 39.0–52.0)
Hemoglobin: 11 g/dL — ABNORMAL LOW (ref 13.0–17.0)
Immature Granulocytes: 4 %
Lymphocytes Relative: 27 %
Lymphs Abs: 5 10*3/uL — ABNORMAL HIGH (ref 0.7–4.0)
MCH: 33.4 pg (ref 26.0–34.0)
MCHC: 34.5 g/dL (ref 30.0–36.0)
MCV: 97 fL (ref 80.0–100.0)
Monocytes Absolute: 1.2 10*3/uL — ABNORMAL HIGH (ref 0.1–1.0)
Monocytes Relative: 7 %
Neutro Abs: 9.9 10*3/uL — ABNORMAL HIGH (ref 1.7–7.7)
Neutrophils Relative %: 54 %
Platelets: 510 10*3/uL — ABNORMAL HIGH (ref 150–400)
RBC: 3.29 MIL/uL — ABNORMAL LOW (ref 4.22–5.81)
RDW: 16.8 % — ABNORMAL HIGH (ref 11.5–15.5)
WBC: 18.1 10*3/uL — ABNORMAL HIGH (ref 4.0–10.5)
nRBC: 0.2 % (ref 0.0–0.2)

## 2022-01-29 LAB — HEPARIN LEVEL (UNFRACTIONATED)
Heparin Unfractionated: 0.5 IU/mL (ref 0.30–0.70)
Heparin Unfractionated: 0.49 IU/mL (ref 0.30–0.70)

## 2022-01-29 LAB — BASIC METABOLIC PANEL
Anion gap: 10 (ref 5–15)
BUN: 14 mg/dL (ref 8–23)
CO2: 30 mmol/L (ref 22–32)
Calcium: 8.4 mg/dL — ABNORMAL LOW (ref 8.9–10.3)
Chloride: 96 mmol/L — ABNORMAL LOW (ref 98–111)
Creatinine, Ser: 0.97 mg/dL (ref 0.61–1.24)
GFR, Estimated: >60 mL/min (ref >60)
Glucose, Bld: 111 mg/dL — ABNORMAL HIGH (ref 70–99)
Potassium: 3.1 mmol/L — ABNORMAL LOW (ref 3.5–5.1)
Sodium: 136 mmol/L (ref 135–145)

## 2022-01-29 MED ORDER — APIXABAN 5 MG PO TABS
10.0000 mg | ORAL_TABLET | Freq: Two times a day (BID) | ORAL | Status: DC
  Administered 2022-01-29 – 2022-01-31 (×5): 10 mg via ORAL
  Filled 2022-01-29 (×5): qty 2

## 2022-01-29 MED ORDER — CARVEDILOL 6.25 MG PO TABS
6.2500 mg | ORAL_TABLET | Freq: Two times a day (BID) | ORAL | Status: DC
  Administered 2022-01-29: 6.25 mg via ORAL
  Filled 2022-01-29: qty 1

## 2022-01-29 MED ORDER — CAMPHOR-MENTHOL 0.5-0.5 % EX LOTN
TOPICAL_LOTION | CUTANEOUS | Status: DC | PRN
  Filled 2022-01-29: qty 222

## 2022-01-29 MED ORDER — APIXABAN 5 MG PO TABS: 5.0000 mg | ORAL_TABLET | Freq: Two times a day (BID) | ORAL | Status: DC

## 2022-01-29 MED ORDER — POTASSIUM CHLORIDE CRYS ER 20 MEQ PO TBCR
40.0000 meq | EXTENDED_RELEASE_TABLET | Freq: Every day | ORAL | Status: DC
  Administered 2022-01-29: 40 meq via ORAL
  Filled 2022-01-29: qty 2

## 2022-01-29 NOTE — Progress Notes (Signed)
Subjective: ?   ? ?Patient reports pain as moderate.  Notes that swelling and discoloration and slowly improving.  Pain is slowly improving. Denies SOB or CP. Notes that he awoke with sweats this morning, but it spontaneously resolved shortly after waking up. ? ?Objective:  ? ?VITALS:  Temp:  [98.7 ?F (37.1 ?C)-98.9 ?F (37.2 ?C)] 98.8 ?F (37.1 ?C) (05/06 0441) ?Pulse Rate:  [92-104] 104 (05/06 0441) ?Resp:  [17-18] 18 (05/06 0441) ?BP: (149-151)/(73-82) 151/82 (05/06 0441) ?SpO2:  [93 %-95 %] 94 % (05/06 0441) ? ?General: WDWN patient in NAD. ?Psych:  Appropriate mood and affect. ?Neuro:  A&O x 3, Moving all extremities, sensation intact to light touch ?HEENT:  EOMs intact ?Chest:  Even non-labored respirations ?Skin:  C/D/I, no rashes or lesions ?Extremities: warm/dry, mild edema to left thigh and calf, mild erythema and echymosis to L med thigh and calf.  No lymphadenopathy. ?Pulses: Popliteus 2+ ?MSK:  ROM: Full ankle ROM pain free MMT: able to perform quad set, (+) Homan's  ? ? ?LABS ?Recent Labs  ?  01/27/22 ?0225 01/28/22 ?5852 01/29/22 ?0137  ?HGB 9.7* 9.9* 11.0*  ?WBC 21.6* 18.2* 18.1*  ?PLT 518* 513* 510*  ? ?Recent Labs  ?  01/28/22 ?7782 01/29/22 ?0137  ?NA 135 136  ?K 3.4* 3.1*  ?CL 95* 96*  ?CO2 31 30  ?BUN 16 14  ?CREATININE 0.87 0.97  ?GLUCOSE 130* 111*  ? ?No results for input(s): LABPT, INR in the last 72 hours. ? ? ?Assessment/Plan: ?   ?L LE DVT S/P L TKA ? ?Patient seen in rounds for Dr. Theda Sers ?Continue immobilization L LE ?Up with therapy ?Anticoags per Medicine team. ? ?Mechele Claude PA-C ?[facility] ?Office:  [phone]  ? ? ?

## 2022-01-29 NOTE — Discharge Instructions (Addendum)
Information on my medicine - ELIQUIS? (apixaban) ? ? ?Why was Eliquis? prescribed for you? ?Eliquis? was prescribed to treat blood clots that may have been found in the veins of your legs (deep vein thrombosis) or in your lungs (pulmonary embolism) and to reduce the risk of them occurring again. ? ?What do You need to know about Eliquis? ? ?The starting dose is 10 mg (two 5 mg tablets) taken TWICE daily for the FIRST SEVEN (7) DAYS, then on 02/05/2022  the dose is reduced to ONE 5 mg tablet taken TWICE daily.  Eliquis? may be taken with or without food.  ? ?Try to take the dose about the same time in the morning and in the evening. If you have difficulty swallowing the tablet whole please discuss with your pharmacist how to take the medication safely. ? ?Take Eliquis? exactly as prescribed and DO NOT stop taking Eliquis? without talking to the doctor who prescribed the medication.  Stopping may increase your risk of developing a new blood clot.  Refill your prescription before you run out. ? ?After discharge, you should have regular check-up appointments with your healthcare provider that is prescribing your Eliquis?. ?   ?What do you do if you miss a dose? ?If a dose of ELIQUIS? is not taken at the scheduled time, take it as soon as possible on the same day and twice-daily administration should be resumed. The dose should not be doubled to make up for a missed dose. ? ?Important Safety Information ?A possible side effect of Eliquis? is bleeding. You should call your healthcare provider right away if you experience any of the following: ?Bleeding from an injury or your nose that does not stop. ?Unusual colored urine (red or dark brown) or unusual colored stools (red or black). ?Unusual bruising for unknown reasons. ?A serious fall or if you hit your head (even if there is no bleeding). ? ?Some medicines may interact with Eliquis? and might increase your risk of bleeding or clotting while on Eliquis?Marland Kitchen To help avoid  this, consult your healthcare provider or pharmacist prior to using any new prescription or non-prescription medications, including herbals, vitamins, non-steroidal anti-inflammatory drugs (NSAIDs) and supplements. ? ?This website has more information on Eliquis? (apixaban): http://www.eliquis.com/eliquis/home  ?

## 2022-01-29 NOTE — Progress Notes (Signed)
ANTICOAGULATION CONSULT NOTE  ? ?Pharmacy Consult for heparin ?Indication: DVT ? ?Allergies  ?Allergen Reactions  ? Tapentadol Rash and Other (See Comments)  ?  Breaks patient out  ? ?Nucynta   ? Paroxetine Hcl Other (See Comments)  ?  Irritability   ? Statins Other (See Comments)  ?  Muscle aches  ? Adhesive [Tape]   ?  Breaks patient out   ? Ciprofloxacin Hives  ? Mannitol Other (See Comments)  ?  Unknown  ? Reclast [Zoledronic Acid] Other (See Comments)  ?  Ended up in hospital/ high fever and   ? Water For Injection [employer], Sterile]   ? Flagyl [Metronidazole] Rash  ? Latex Rash  ?  Irritated skin  ? ? ?Patient Measurements: ?Height: '5\' 4"'$  (162.6 cm) ?Weight: 83.9 kg (185 lb) ?IBW/kg (Calculated) : 59.2 ?Heparin Dosing Weight: 77 kg ? ?Vital Signs: ?Temp: 98.7 ?F (37.1 ?C) (05/05 2050) ?Temp Source: Oral (05/05 2050) ?BP: 151/80 (05/05 2050) ?Pulse Rate: 101 (05/05 2050) ? ?Labs: ?Recent Labs  ?  01/27/22 ?0225 01/28/22 ?6962 01/28/22 ?1450 01/29/22 ?0137  ?HGB 9.7* 9.9*  --  11.0*  ?HCT 27.7* 29.7*  --  31.9*  ?PLT 518* 513*  --  510*  ?APTT 81* 68* 67*  --   ?HEPARINUNFRC 0.70 0.24* 0.27* 0.50  ?CREATININE 0.97 0.87  --  0.97  ? ? ? ?Estimated Creatinine Clearance: 64.3 mL/min (by C-G formula based on SCr of 0.97 mg/dL). ? ? ?Medical History: ?Past Medical History:  ?Diagnosis Date  ? Asthma   ? Atypical nevus 01/26/2006  ? right side of body (slight)  ? Basal cell carcinoma 11/17/2015  ? left ear rim tx cx3 50f  ? Cancer ([facility]   ? Hypertension   ? Hypothyroidism   ? Insomnia   ? 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 [place]  ? right ant. shoulder (exc)  ? [place]   ? feet  ? Pancreatitis   ? Pre-diabetes   ? Prostate CA ([facility]   ? Spondylosis of lumbar spine   ? Stroke ([facility] 2021  ? TIA x 2 some memory loss.  ? Thyroid disease   ? ? ?Assessment: ?76yo M s/p L TKA 4/26 with new L DVT. Pharmacy consulted for IV heparin dosing on 5/3. ?He got 1 dose of LMWH 40 mg on 4/27 &  was discharged on ASA '81mg'$  BID to start 4/28.  ?He got 1 dose Eliquis 10 mg in ED 5/2 @ 1726 ?He got 1 dose SQ heparin 5000 units 5/3 @ 09528?Pt w/ ecchymosis on L leg- has been evaluated by ortho & TRH  ?Hgb 9.2 on 5/3. Pre-op Hgb was 15.5 on 4/20. ?Pltc elevated > 500K, pre-op was 224K on 4/20.  ? ?Today, 01/29/22: ?Heparin level = 0.50 units/mL, therapeutic on heparin infuison at 1550 units/hr ?Hgb up to 11, plts 510 ?Per ortho, the required anticoagulation has caused increased bruising and subcutaneous bleeding. Dressing change/wound with minimal bloody drainage.  ?Per RN no bleeding ? ? ?Goal of Therapy:  ?Heparin level 0.3-0.7 units/ml ?aPTT 66-102 seconds ?Monitor platelets by anticoagulation protocol: Yes ?  ?Plan:  ?Continue heparin infusion at 1550 units/hr  ?Confirmatory Heparin level in 8 hours  ?Daily CBC, heparin level ?Monitor closely for s/sx of worsening bleeding ? ?EDolly RiasRPh ?01/29/2022, 2:30 AM ? ?

## 2022-01-29 NOTE — Progress Notes (Signed)
?PROGRESS NOTE ? ? ?Joel Kim  ION:[id number] DOB: 1946-07-28 DOA: 01/25/2022 ?PCP: [name], MD  ?Brief Narrative:  ? ?17 y WM known asthma HTN hypothyroid ?Prior CBD stone related pancreatitis 2019 ?Chronic pain syndrome ?Prior migraine versus TIA 2012, OSA ?Recent total knee repair Dr. Theda Sers 4/26-- ?4/28 bleeding concerns and inner thigh subcu bleeding and also had excruciating pain requiring double dose of Dilaudid--had been seen in the office 4/28 by Leroy Sea Dixon-Rx doxycycline at that time and placed on stronger pain meds as above ?WBC 20 potassium 2.5 ?5/2-admitted again from Ortho office secondary to concern of bleeding at the site with worsening swelling ? ?Hospital-Problem based course ? ?Acute left lower extremity DVT in the setting of recent total knee repair 01/19/2022 ?swelling pain likely secondary to acute DVT  ?Convert IV hep to DOAC ?Aspirin has been held from admit ?recent TKR 4/26-?  Concern for infection ?Wound per Dr. Theda Sers seems appropriately swollen-leukocytosis may be expected with the ecchymosis/DVT ?Continue 5 days ceftriaxone--stop on 5/6 ?Acute metabolic encephalopathy on admission  ?gabapentin, trazodone held from admission continue ?Continue Norco 1 tab every 6 hourly scheduled, Dilaudid 1 mg severe pain every 3 continue Robaxin 500 every 6 ?HTN ?Continue amlodipine 5, HCTZ 25- ?Add coreg bid 6.25 5/6 ?Rash on back ?Continue atarax 10 tid, add Sarna--probably from opiates ?Reflux-pantoprazole 40 daily ?Migraine-Imitrex held  ?Hypothyroid-100 micrograms daily, TSH outpatient ? ? ?DVT prophylaxis: heparin ?Code Status: Full ?Family Communication: wife at bedside ?Disposition:  ?Status is: Inpatient ?Remains inpatient appropriate because:  ? ?Needs a drop in WBC and decrease in pain ?  ?Consultants:  ?Dr. Theda Sers Ortho ? ?Procedures:  ? ?Antimicrobials:   ? ? ?Subjective: ? ?Seen on round with RN/Wife present for entirety ?Feels some better, no cp no fever ?Eating  more--Willing to trial oral pain meds >IV ?Rash on back about the same ? ?Objective: ?Vitals:  ? 01/28/22 0424 01/28/22 1335 01/28/22 2050 01/29/22 0441  ?BP: (!) 158/78 (!) 149/73 (!) 151/80 (!) 151/82  ?Pulse: 94 92 (!) 101 (!) 104  ?Resp: '16 17 18 18  '$ ?Temp: (!) 100.4 ?F (38 ?C) 98.9 ?F (37.2 ?C) 98.7 ?F (37.1 ?C) 98.8 ?F (37.1 ?C)  ?TempSrc: Oral Oral Oral   ?SpO2: 95% 93% 95% 94%  ?Weight:      ?Height:      ? ? ?Intake/Output Summary (Last 24 hours) at 01/29/2022 1154 ?Last data filed at 01/29/2022 0720 ?Gross per 24 hour  ?Intake 774.87 ml  ?Output 2025 ml  ?Net -1250.13 ml  ? ? ?Filed Weights  ? 01/25/22 1238  ?Weight: 83.9 kg  ? ? ?Examination: ? ?Awake coherent in nad no focal deficit ?No ict pallor ?Neck soft supple ?S1 s 2no m/r/g ?Cta b ?ROM intact ?LLE visibly less swollen than prior--it is being elevated as well ? ?Data Reviewed: personally reviewed  ? ?CBC ?   ?Component Value Date/Time  ? WBC 18.1 (H) 01/29/2022 0137  ? RBC 3.29 (L) 01/29/2022 0137  ? HGB 11.0 (L) 01/29/2022 0137  ? HCT 31.9 (L) 01/29/2022 0137  ? PLT 510 (H) 01/29/2022 0137  ? MCV 97.0 01/29/2022 0137  ? MCH 33.4 01/29/2022 0137  ? MCHC 34.5 01/29/2022 0137  ? RDW 16.8 (H) 01/29/2022 0137  ? LYMPHSABS 5.0 (H) 01/29/2022 0137  ? MONOABS 1.2 (H) 01/29/2022 0137  ? EOSABS 1.2 (H) 01/29/2022 0137  ? BASOSABS 0.1 01/29/2022 0137  ? ? ?  Latest Ref Rng & Units 01/29/2022  ?  1:37  AM 01/28/2022  ?  5:16 AM 01/27/2022  ?  2:25 AM  ?CMP  ?Glucose 70 - 99 mg/dL 111   130   128    ?BUN 8 - 23 mg/dL '14   16   17    '$ ?Creatinine 0.61 - 1.24 mg/dL 0.97   0.87   0.97    ?Sodium 135 - 145 mmol/L 136   135   133    ?Potassium 3.5 - 5.1 mmol/L 3.1   3.4   3.5    ?Chloride 98 - 111 mmol/L 96   95   97    ?CO2 22 - 32 mmol/L '30   31   29    '$ ?Calcium 8.9 - 10.3 mg/dL 8.4   8.7   8.1    ? ? ? ?Radiology Studies: ?No results found. ? ? ?Scheduled Meds: ? amLODipine  5 mg Oral Daily  ? carvedilol  6.25 mg Oral BID WC  ? hydrochlorothiazide  25 mg Oral Daily  ?  HYDROcodone-acetaminophen  1 tablet Oral Q6H  ? levothyroxine  100 mcg Oral QAC breakfast  ? methocarbamol  500 mg Oral Q6H  ? pantoprazole  40 mg Oral Daily  ? potassium chloride  40 mEq Oral Daily  ? ?Continuous Infusions: ? cefTRIAXone (ROCEPHIN)  IV 2 g (01/29/22 1146)  ? heparin 1,550 Units/hr (01/29/22 0720)  ? ? ? LOS: 2 days  ? ?Time spent: 40 ? ?Nita Sells, MD ?[facility] ?To contact the attending provider between 7A-7P or the covering provider during after hours 7P-7A, please log into the web site [email or web address] and access using universal  password for that web site. If you do not have the password, please call the hospital operator. ? ?01/29/2022, 11:54 AM  ? ? ?

## 2022-01-29 NOTE — Plan of Care (Signed)

## 2022-01-29 NOTE — Progress Notes (Signed)
ANTICOAGULATION CONSULT NOTE - Follow Up Consult ? ?Pharmacy Consult for apixaban ?Indication: DVT ? ?Allergies  ?Allergen Reactions  ? Tapentadol Rash and Other (See Comments)  ?  Breaks patient out  ? ?Nucynta   ? Paroxetine Hcl Other (See Comments)  ?  Irritability   ? Statins Other (See Comments)  ?  Muscle aches  ? Adhesive [Tape]   ?  Breaks patient out   ? Ciprofloxacin Hives  ? Mannitol Other (See Comments)  ?  Unknown  ? Reclast [Zoledronic Acid] Other (See Comments)  ?  Ended up in hospital/ high fever and   ? Water For Injection [employer], Ster[employer]]   ? Flagyl [Metronidazole] Rash  ? Latex Rash  ?  Irritated skin  ? ? ?Patient Measurements: ?Height: '5\' 4"'$  (162.6 cm) ?Weight: 83.9 kg (185 lb) ?IBW/kg (Calculated) : 59.2 ? ?Vital Signs: ?Temp: 98.8 ?F (37.1 ?C) (05/06 0441) ?BP: 151/82 (05/06 0441) ?Pulse Rate: 104 (05/06 0441) ? ?Labs: ?Recent Labs  ?  01/27/22 ?0225 01/28/22 ?5093 01/28/22 ?1450 01/29/22 ?0137 01/29/22 ?1027  ?HGB 9.7* 9.9*  --  11.0*  --   ?HCT 27.7* 29.7*  --  31.9*  --   ?PLT 518* 513*  --  510*  --   ?APTT 81* 68* 67*  --   --   ?HEPARINUNFRC 0.70 0.24* 0.27* 0.50 0.49  ?CREATININE 0.97 0.87  --  0.97  --   ? ? ?Estimated Creatinine Clearance: 64.3 mL/min (by C-G formula based on SCr of 0.97 mg/dL). ? ? ?Medications:  ?He got 1 dose of LMWH 40 mg on 4/27 & was discharged on ASA '81mg'$  BID to start 4/28.  ?He got 1 dose Eliquis 10 mg in ED 5/2 @ 1726 ?He got 1 dose SQ heparin 5000 units 5/3 @ 2671 ?5/3 - 5/6 IV heparin ? ?Assessment: ?76 yo M s/p L TKA 4/26 with new L DVT. Pharmacy consulted for IV heparin dosing on 5/3.  Today, pharmacy is consulted to dose apixaban (and transition off IV heparin) ? ? ?Goal of Therapy:  ?DVT treatment ?Monitor platelets by anticoagulation protocol: Yes ?  ?Plan:  ?Discontinue IV heparin and associated labs ?Apixaban 10 mg twice daily for 7 days followed by 5 mg twice daily ?Monitor CBC, signs/symptoms of bleeding ? ? ?Royetta Asal, PharmD,  BCPS ?Clinical Pharmacist ?[place] ?Please utilize Amion for appropriate phone number to reach the unit pharmacist ([place]) ?01/29/2022 12:21 PM ? ? ? ?

## 2022-01-30 DIAGNOSIS — I82452 Acute embolism and thrombosis of left peroneal vein: Secondary | ICD-10-CM

## 2022-01-30 LAB — CBC
HCT: 30.5 % — ABNORMAL LOW (ref 39.0–52.0)
Hemoglobin: 10.4 g/dL — ABNORMAL LOW (ref 13.0–17.0)
MCH: 33.3 pg (ref 26.0–34.0)
MCHC: 34.1 g/dL (ref 30.0–36.0)
MCV: 97.8 fL (ref 80.0–100.0)
Platelets: 496 10*3/uL — ABNORMAL HIGH (ref 150–400)
RBC: 3.12 MIL/uL — ABNORMAL LOW (ref 4.22–5.81)
RDW: 16.9 % — ABNORMAL HIGH (ref 11.5–15.5)
WBC: 17.7 10*3/uL — ABNORMAL HIGH (ref 4.0–10.5)
nRBC: 0.2 % (ref 0.0–0.2)

## 2022-01-30 LAB — BASIC METABOLIC PANEL
Anion gap: 8 (ref 5–15)
BUN: 15 mg/dL (ref 8–23)
CO2: 29 mmol/L (ref 22–32)
Calcium: 8.6 mg/dL — ABNORMAL LOW (ref 8.9–10.3)
Chloride: 97 mmol/L — ABNORMAL LOW (ref 98–111)
Creatinine, Ser: 0.95 mg/dL (ref 0.61–1.24)
GFR, Estimated: >60 mL/min (ref >60)
Glucose, Bld: 122 mg/dL — ABNORMAL HIGH (ref 70–99)
Potassium: 3.3 mmol/L — ABNORMAL LOW (ref 3.5–5.1)
Sodium: 134 mmol/L — ABNORMAL LOW (ref 135–145)

## 2022-01-30 MED ORDER — POLYETHYLENE GLYCOL 3350 17 G PO PACK: 17.0000 g | PACK | Freq: Every day | ORAL | 0 refills | Status: AC | PRN

## 2022-01-30 MED ORDER — CARVEDILOL 12.5 MG PO TABS: 12.5000 mg | ORAL_TABLET | Freq: Two times a day (BID) | ORAL | Status: DC

## 2022-01-30 MED ORDER — APIXABAN 5 MG PO TABS: 10.0000 mg | ORAL_TABLET | Freq: Two times a day (BID) | ORAL | Status: DC

## 2022-01-30 MED ORDER — OXYCODONE HCL 5 MG PO TABS
10.0000 mg | ORAL_TABLET | Freq: Four times a day (QID) | ORAL | Status: DC
  Administered 2022-01-30 – 2022-01-31 (×3): 10 mg via ORAL
  Filled 2022-01-30 (×3): qty 2

## 2022-01-30 MED ORDER — POTASSIUM CHLORIDE CRYS ER 20 MEQ PO TBCR
40.0000 meq | EXTENDED_RELEASE_TABLET | Freq: Two times a day (BID) | ORAL | Status: DC
  Administered 2022-01-30 – 2022-01-31 (×3): 40 meq via ORAL
  Filled 2022-01-30 (×3): qty 2

## 2022-01-30 MED ORDER — APIXABAN 5 MG PO TABS: 5.0000 mg | ORAL_TABLET | Freq: Two times a day (BID) | ORAL | 3 refills | Status: DC

## 2022-01-30 MED ORDER — HYDROCODONE-ACETAMINOPHEN 10-325 MG PO TABS: 1.0000 | ORAL_TABLET | Freq: Four times a day (QID) | ORAL | 0 refills | Status: DC

## 2022-01-30 MED ORDER — CARVEDILOL 12.5 MG PO TABS
12.5000 mg | ORAL_TABLET | Freq: Two times a day (BID) | ORAL | Status: DC
  Administered 2022-01-30 – 2022-01-31 (×3): 12.5 mg via ORAL
  Filled 2022-01-30 (×3): qty 1

## 2022-01-30 MED ORDER — HYDROXYZINE HCL 10 MG PO TABS: 10.0000 mg | ORAL_TABLET | Freq: Three times a day (TID) | ORAL | 0 refills | Status: DC | PRN

## 2022-01-30 MED ORDER — HYDROMORPHONE HCL 1 MG/ML IJ SOLN
1.0000 mg | Freq: Once | INTRAMUSCULAR | Status: AC
  Administered 2022-01-30: 1 mg via INTRAVENOUS
  Filled 2022-01-30: qty 1

## 2022-01-30 NOTE — Progress Notes (Signed)
Patient seen and examined and doing overall much better-has worked with therapy and will need rehab in the outpatient setting at skilled facility. ? ?I have left a message for TOC and have updated discharge instructions for as early as tomorrow if this can be authorized ? ?Rounding physician to check in on patient and update discharge summary as needed ? ?Verneita Griffes, MD ?[facility] ?1:01 PM ? ?

## 2022-01-30 NOTE — Progress Notes (Signed)
Physical Therapy Treatment ?Patient Details ?Name: Joel Kim ?MRN: [id number] ?DOB: 1946-03-27 ?Today's Date: 01/30/2022 ? ? 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 Theda Sers on 01/19/2022. Went to [facility] ER on 01/25/2022 and was transferred to [facility] for treatment. When in the ED K levels were found to be 2.5. ? ?  ?PT Comments  ? ? POD # 4 pm session ?Pt not progressing as well as expected and will need ST Rehab at SNF.   ?Assisted OOB again this afternoon was difficult.  General transfer comment: required 75% VC's on proper hand placement as well as L LE advancement prior to sit/stand.  Unsteady.  Pt unwilling to place L LE on floor due to pain.  Pt self TTWB and causing increased balance instability.  75% VC's to increased [place] "foot flat" to increase his stability.  General Gait Details: VERY limited amb distance due to gait instability and pt reporting 10/10 L LE despite being premedicated.  Pt self TTWBing causing increased gait instability.  75% VC's to Educate/encourage "heel strike" to increase gait stability.   HIGH FALL RISK. ?Assisted back to bed and elevated L LE as well as applied ICE.    ?Recommendations for follow up therapy are one component of a multi-disciplinary discharge planning process, led by the attending physician.  Recommendations may be updated based on patient status, additional functional criteria and insurance authorization. ? ?Follow Up Recommendations ?  SNF ?  ?  ?    ?   ?  ?    ?  ?   ? ? ?  ?Precautions / Restrictions   No pillow under knee ?  ? ?Mobility ? Bed Mobility ?Overal bed mobility: Needs Assistance ?Bed Mobility: Supine to Sit ?  ?  ?Supine to sit: Mod assist ?  ?  ?General bed mobility comments: demonstarted and instructed how to use belt to self assist L LE still required Mod Assist for upper body and great difficulty self scooting to EOB due to pain and effort.  Also  required increased time. ?  ? ?Transfers ?Overall transfer level: Needs assistance ?Equipment used: Rolling walker (2 wheels) ?Transfers: Sit to/from Stand ?Sit to Stand: Mod assist, Min assist ?  ?  ?  ?  ?  ?General transfer comment: required 75% VC's on proper hand placement as well as L LE advancement prior to sit/stand.  Unsteady.  Pt unwilling to place L LE on floor due to pain.  Pt self TTWB and causing increased balance instability.  75% VC's to increased [place] "foot flat" to increase his stability. ?  ? ?Ambulation/Gait ?Ambulation/Gait assistance: Min assist ?Gait Distance (Feet): 18 Feet ?Assistive device: Rolling walker (2 wheels) ?Gait Pattern/deviations: Step-to pattern, Decreased stance time - left ?Gait velocity: decreased ?  ?  ?General Gait Details: VERY limited amb distance due to gait instability and pt reporting 10/10 L LE despite being premedicated.  Pt self TTWBing causing increased gait instability.  75% VC's to Educate/encourage "heel strike" to increase gait stability.   HIGH FALL RISK. ? ? ?Stairs ?  ?  ?  ?  ?  ? ? ?Wheelchair Mobility ?  ? ?Modified Rankin (Stroke Patients Only) ?  ? ? ?  ?Balance   ?  ?  ?  ?  ?  ?  ?  ?  ?  ?  ?  ?  ?  ?  ?  ?  ?  ?  ?  ? ?  ?  Cognition Arousal/Alertness: Awake/alert ?Behavior During Therapy: [facility] for tasks assessed/performed ?Overall Cognitive Status: Within Functional Limits for tasks assessed ?  ?  ?  ?  ?  ?  ?  ?  ?  ?  ?  ?  ?  ?  ?  ?  ?General Comments: AxO x 3 farmer but with poor medical insight.  Required repeat instructions and Education throughout session. ?  ?  ? ?  ?   ? ?  ?   ?  ?  ? ?Pertinent Vitals/Pain Pain Assessment ?Pain Assessment: 0-10 ?Pain Score: 10-Worst pain ever ?Faces Pain Scale: Hurts worst ?Pain Location: L LE ?Pain Descriptors / Indicators: Aching, Grimacing, Sore, Operative site guarding ?Pain Intervention(s): Monitored during session, Premedicated before session, Repositioned, Ice applied  ? ? ? ? ? ? ?Time:  2902-1115 ?PT Time Calculation (min) (ACUTE ONLY): 25 min ? ?Charges:  $Gait Training: 8-22 mins ?$Therapeutic Activity: 8-22 mins          ?          ?Rica Koyanagi  PTA ?Acute  Rehabilitation Services ?Pager      [phone] ?Office      [phone] ? ?

## 2022-01-30 NOTE — Progress Notes (Signed)
Physical Therapy Treatment ?Patient Details ?Name: Joel Kim ?MRN: [id number] ?DOB: 1946/04/02 ?Today's Date: 01/30/2022 ? ? 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 Theda Sers on 01/19/2022. Went to [facility] ER on 01/25/2022 and was transferred to [facility] for treatment. When in the ED K levels were found to be 2.5. ? ?  ?PT Comments  ? ? POD # 11 L TKR Re Admit DVT ?General Comments: AxO x 3 farmer but with poor medical insight.  Required repeat instructions and Education throughout session.  Pt in bed with L LE positioned in both hip external rotation and knee flex.  Educated on importance to elevate LE and position in extension.  Assisted OOB required increased time and effort.  General bed mobility comments: demonstarted and instructed how to use belt to self assist L LE still required Mod Assist for upper body and great difficulty self scooting to EOB due to pain and effort.  General transfer comment: required 75% VC's on proper hand placement as well as L LE advancement prior to sit/stand.  Unsteady.  Pt unwilling to place L LE on floor due to pain.  Pt self TTWB and causing increased balance instability.  75% VC's to increased [place] "foot flat" to increase his stability.  General Gait Details: VERY limited amb distance due to gait instability and pt reporting 10/10 L LE despite being premedicated.  Pt self TTWBing causing increased gait instability.  75% VC's to Educate/encourage "heel strike" to increase gait stability.   HIGH FALL RISK. Assisted back to bed to perform some TE's esp knee presses.  Pt lacking approx 15 degrees of extension.  After TE's applied ICE and used multiple pillows to attempt to achieve full LE extension as well as elevation.  Approx L knee ROM is limited lacking 15 degrees of extension and only achieving 70 degrees of flexion.  ?Pt will need ST Rehab at SNF prior to safely returning home.  Will  consult LPT.     ?Recommendations for follow up therapy are one component of a multi-disciplinary discharge planning process, led by the attending physician.  Recommendations may be updated based on patient status, additional functional criteria and insurance authorization. ? ?Follow Up Recommendations ? Follow physician's recommendations for discharge plan and follow up therapies ?  ?  ?Assistance Recommended at Discharge Intermittent Supervision/Assistance  ?Patient can return home with the following Help with stairs or ramp for entrance;Assist for transportation;Assistance with cooking/housework ?  ?Equipment Recommendations ? None recommended by PT  ?  ?Recommendations for Other Services   ? ? ?  ?Precautions / Restrictions Precautions ?Precautions: Fall;Knee ?Precaution Comments: instructed no pillow under knee ?Restrictions ?Weight Bearing Restrictions: No ?LLE Weight Bearing: Weight bearing as tolerated  ?  ? ?Mobility ? Bed Mobility ?Overal bed mobility: Needs Assistance ?Bed Mobility: Supine to Sit ?  ?  ?Supine to sit: Mod assist ?  ?  ?General bed mobility comments: demonstarted and instructed how to use belt to self assist L LE still required Mod Assist for upper body and great difficulty self scooting to EOB due to pain and effort. ?  ? ?Transfers ?Overall transfer level: Needs assistance ?Equipment used: Rolling walker (2 wheels) ?Transfers: Sit to/from Stand ?Sit to Stand: Mod assist, Min assist ?  ?  ?  ?  ?  ?General transfer comment: required 75% VC's on proper hand placement as well as L  LE advancement prior to sit/stand.  Unsteady.  Pt unwilling to place L LE on floor due to pain.  Pt self TTWB and causing increased balance instability.  75% VC's to increased [place] "foot flat" to increase his stability. ?  ? ?Ambulation/Gait ?Ambulation/Gait assistance: Min assist ?Gait Distance (Feet): 12 Feet ?Assistive device: Rolling walker (2 wheels) ?Gait Pattern/deviations: Step-to pattern, Decreased  stance time - left ?Gait velocity: decreased ?  ?  ?General Gait Details: VERY limited amb distance due to gait instability and pt reporting 10/10 L LE despite being premedicated.  Pt self TTWBing causing increased gait instability.  75% VC's to Educate/encourage "heel strike" to increase gait stability.   HIGH FALL RISK. ? ? ?Stairs ?  ?  ?  ?  ?  ? ? ?Wheelchair Mobility ?  ? ?Modified Rankin (Stroke Patients Only) ?  ? ? ?  ?Balance   ?  ?  ?  ?  ?  ?  ?  ?  ?  ?  ?  ?  ?  ?  ?  ?  ?  ?  ?  ? ?  ?Cognition Arousal/Alertness: Awake/alert ?Behavior During Therapy: [facility] for tasks assessed/performed ?Overall Cognitive Status: Within Functional Limits for tasks assessed ?  ?  ?  ?  ?  ?  ?  ?  ?  ?  ?  ?  ?  ?  ?  ?  ?General Comments: AxO x 3 farmer but with poor medical insight.  Required repeat instructions and Education throughout session. ?  ?  ? ?  ?Exercises   ?15 resp AP, 10 reps knee presses, 10 reps pillow squeezes and 10 resp knee flexion ?Approx L knee ROM is limited lacking 15 degrees of extension and 70 degrees of flexion.  ?  ?General Comments   ?  ?  ? ?Pertinent Vitals/Pain Pain Assessment ?Pain Assessment: 0-10 ?Pain Score: 10-Worst pain ever ?Faces Pain Scale: Hurts worst ?Pain Location: L LE ?Pain Descriptors / Indicators: Aching, Grimacing, Sore, Operative site guarding ?Pain Intervention(s): Monitored during session, Premedicated before session, Repositioned, Ice applied  ? ? ?Home Living   ?  ?  ?  ?  ?  ?  ?  ?  ?  ?   ?  ?Prior Function    ?  ?  ?   ? ?PT Goals (current goals can now be found in the care plan section) Progress towards PT goals: Progressing toward goals ? ?  ?Frequency ? ? ? Min 5X/week ? ? ? ?  ?PT Plan Current plan remains appropriate  ? ? ?Co-evaluation   ?  ?  ?  ?  ? ?  ?AM-PAC PT "6 Clicks" Mobility   ?Outcome Measure ? Help needed turning from your back to your side while in a flat bed without using bedrails?: A Little ?Help needed moving from lying on your back to  sitting on the side of a flat bed without using bedrails?: A Little ?Help needed moving to and from a bed to a chair (including a wheelchair)?: A Little ?Help needed standing up from a chair using your arms (e.g., wheelchair or bedside chair)?: A Lot ?Help needed to walk in hospital room?: A Lot ?Help needed climbing 3-5 steps with a railing? : Total ?6 Click Score: 14 ? ?  ?End of Session Equipment Utilized During Treatment: Gait belt ?Activity Tolerance: Patient limited by pain ?Patient left: in chair;with call bell/phone within reach;with chair alarm set;with family/visitor present ?  Nurse Communication: Mobility status ?PT Visit Diagnosis: Other abnormalities of gait and mobility (R26.89);Difficulty in walking, not elsewhere classified (R26.2) ?  ? ? ?Time: 8948-3475 ?PT Time Calculation (min) (ACUTE ONLY): 34 min ? ?Charges:  $Gait Training: 8-22 mins ?$Therapeutic Exercise: 8-22 mins          ?          ? ?{Akshita Italiano  PTA ?Acute  Rehabilitation Services ?Pager      [phone] ?Office      [phone] ? ?

## 2022-01-30 NOTE — TOC Progression Note (Addendum)
Transition of Care (TOC) - Progression Note  ? ? ?Patient Details  ?Name: Joel Kim ?MRN: [id number] ?Date of Birth: 1946-08-14 ? ?Transition of Care (TOC) CM/SW Contact  ?Ross Ludwig, LCSW ?Phone Number: ?01/30/2022, 4:14 PM ? ?Clinical Narrative:    ? ?CSW was informed that patient and wife now would like SNF placement.  CSW spoke to patient wife and she is interested in SNF placement at [facility].  CSW contacted Ebony Hail in admissions and asked her to review patient's information.  Per Ebony Hail she will review and let CSW know if they can accept her.  Patient will need insurance authorization prior to discharge.  CSW to start insurance authorization once SNF determines if they can accept him or not. ? ?5:40pm  CSW received bed offer from [facility], and also received insurance authorization which starts tomorrow.  Patient has been approved reference number [id number], next review 02/02/2022.  CSW updated patient's wife, and also Ebony Hail at [facility].  If patient is medically ready for discharge, SNF can accept him tomorrow.  CSW updated attending physician. ? ? ?Expected Discharge Plan: Home/Self Care ?Barriers to Discharge: Continued Medical Work up ? ?Expected Discharge Plan and Services ?Expected Discharge Plan: Home/Self Care ?  ?  ?  ?Living arrangements for the past 2 months: [place] ?Expected Discharge Date: 01/31/22               ?  ?  ?  ?  ?  ?  ?  ?  ?  ?  ? ? ?Social Determinants of Health (SDOH) Interventions ?  ? ?Readmission Risk Interventions ?   ? View : No data to display.  ?  ?  ?  ? ? ?

## 2022-01-30 NOTE — Plan of Care (Signed)

## 2022-01-30 NOTE — Discharge Summary (Addendum)
Physician Discharge Summary  ?Joel Kim ZOX:[id number] DOB: 26-Oct-1945 DOA: 01/25/2022 ? ?PCP: Joel Noon, MD ? ?Admit date: 01/25/2022 ?Discharge date: 01/31/2022 ? ?Addendum on 01/31/2022: ?Patient will be discharged to SNF today if bed is available ? ?Time spent: 37 minutes ? ?Recommendations for Outpatient Follow-up:  ?Outpatient CBC and Chem-12 in about 3 to 4 days on discharge ?Needs to be seen by Dr. Theda Kim who I will CC on this note on discharge ?Recommend TSH in the outpatient setting in about 2 to 3 months ? ?Discharge Diagnoses:  ?MAIN problem for hospitalization  ? ?Left lower extremity DVT and hematoma ecchymosis with inflammation ? ?Please see below for itemized issues addressed in HOpsital- ?refer to other progress notes for clarity if needed ? ?Discharge Condition: Improved ? ?Diet recommendation: Heart healthy ? ?Filed Weights  ? 01/25/22 1238  ?Weight: 83.9 kg  ? ? ?History of present illness:  ?69 y WM known asthma HTN hypothyroid ?Prior CBD stone related pancreatitis 2019 ?Chronic pain syndrome ?Prior migraine versus TIA 2012, OSA ? ?Recent total knee repair Dr. Theda Kim 4/26-- ?On 4/28 bleeding concerns and inner thigh subcu bleeding and also had excruciating pain requiring double dose of Dilaudid--had been seen in the office 4/28 by Joel Kim-Rx doxycycline at that time and placed on stronger pain meds as above ?WBC 20 potassium 2.5 ?5/2-admitted again from Ortho office secondary to concern of bleeding at the site with worsening swelling ?  ?Hospital-Problem based course ?  ?Acute left lower extremity DVT in the setting of recent total knee repair 01/19/2022 ?swelling pain likely secondary to acute DVT  ?Converted to a minimum of 3 months anticoagulation (provoked DVT in setting of surgery) ?Aspirin discontinued this admission ?recent TKR 4/26-?  Concern for infection ?Wound per Dr. Theda Kim seems appropriately swollen-leukocytosis may be expected with the ecchymosis/DVT ?Continue 5 days  ceftriaxone--stop on 5/6--as patient has had no high-grade fevers or other issues, feel comfortable with trending leukocytosis as an outpatient in 3 to 4 days-swelling has come down significantly and nicely as per pictures added to chart below ? ? ? ?Acute metabolic encephalopathy on admission  ?Meds adjusted on admission secondary to encephalopathy and discontinued several of them ?See updated MAR ?HTN ?Continue amlodipine 5, HCTZ 25- ?Add coreg bid 12.5 this admit ?Rash on back ?Continue atarax 10 tid, add Sarna--probably from opiates ?Reflux-pantoprazole 40 daily ?Migraine-Imitrex held  ?Hypothyroid-100 micrograms daily, TSH outpatient ?Consultations: ?Orthopedics Dr. Theda Kim ? ?Discharge Exam: ?Vitals:  ? 01/30/22 0519 01/30/22 1254  ?BP: (!) 155/89 125/74  ?Pulse: 87 80  ?Resp: 16 18  ?Temp: 98.6 ?F (37 ?C) 98.8 ?F (37.1 ?C)  ?SpO2: 95% 96%  ? ? ?Subj on day of d/c ?  ?Seen and examined ? ?General Exam on discharge ? ?Awake alert coherent wound is improved swelling is decreased no chest pain ?Abdomen soft no rebound ?ROM intact ?Neuro intact ? ?Discharge Instructions ? ? ?Discharge Instructions   ? ? Diet - low sodium heart healthy   Complete by: As directed ?  ? Increase activity slowly   Complete by: As directed ?  ? No wound care   Complete by: As directed ?  ? ?  ? ?Allergies as of 01/31/2022   ? ?   Reactions  ? Tapentadol Rash, Other (See Comments)  ? Breaks patient out  ?Nucynta   ? Paroxetine Hcl Other (See Comments)  ? Irritability   ? Statins Other (See Comments)  ? Muscle aches  ? Adhesive [tape]   ?  Breaks patient out   ? Ciprofloxacin Hives  ? Mannitol Other (See Comments)  ? Unknown  ? Reclast [zoledronic Acid] Other (See Comments)  ? Ended up in hospital/ high fever and   ? Water For Injection [employer], Sterile]   ? Flagyl [metronidazole] Rash  ? Latex Rash  ? Irritated skin  ? ?  ? ?  ?Medication List  ?  ? ?STOP taking these medications   ? ?cetirizine 10 MG tablet ?Commonly known as: ZYRTEC ?   ?doxycycline 50 MG capsule ?Commonly known as: VIBRAMYCIN ?  ?gabapentin 300 MG capsule ?Commonly known as: NEURONTIN ?  ?HYDROmorphone 2 MG tablet ?Commonly known as: Dilaudid ?  ?mupirocin ointment 2 % ?Commonly known as: BACTROBAN ?  ?ondansetron 4 MG tablet ?Commonly known as: Zofran ?  ?Oxycodone HCl 10 MG Tabs ?  ?PROBIOTIC PO ?  ?Repatha 140 MG/ML Sosy ?Generic drug: Evolocumab ?  ?[place] 4 % Crea ?Generic drug: Fluorouracil ?  ?traZODone 50 MG tablet ?Commonly known as: DESYREL ?  ?triamcinolone cream 0.1 % ?Commonly known as: KENALOG ?  ?valACYclovir 1000 MG tablet ?Commonly known as: VALTREX ?  ? ?  ? ?TAKE these medications   ? ?albuterol 108 (90 Base) MCG/ACT inhaler ?Commonly known as: VENTOLIN HFA ?Inhale 2 puffs into the lungs every 6 (six) hours as needed. ?What changed: reasons to take this ?  ?amLODipine 5 MG tablet ?Commonly known as: NORVASC ?Take 1 tablet by mouth daily. ?What changed: Another medication with the same name was removed. Continue taking this medication, and follow the directions you see here. ?  ?apixaban 5 MG Tabs tablet ?Commonly known as: ELIQUIS ?Take 2 tablets (10 mg total) by mouth 2 (two) times daily for 6 days. ?  ?apixaban 5 MG Tabs tablet ?Commonly known as: ELIQUIS ?Take 1 tablet (5 mg total) by mouth 2 (two) times daily. ?Start taking on: Feb 05, 2022 ?  ?aspirin EC 81 MG tablet ?Take 1 tablet (81 mg total) by mouth 2 (two) times daily. Swallow whole. ?  ?carvedilol 12.5 MG tablet ?Commonly known as: COREG ?Take 1 tablet (12.5 mg total) by mouth 2 (two) times daily with a meal. ?  ?hydrochlorothiazide 25 MG tablet ?Commonly known as: HYDRODIURIL ?Take 25 mg by mouth daily. ?  ?hydrOXYzine 10 MG tablet ?Commonly known as: ATARAX ?Take 1 tablet (10 mg total) by mouth 3 (three) times daily as needed for itching. ?  ?levothyroxine 100 MCG tablet ?Commonly known as: SYNTHROID ?Take 100 mcg by mouth daily before breakfast. ?  ?methocarbamol 500 MG tablet ?Commonly known as:  Robaxin ?Take 1 tablet (500 mg total) by mouth every 6 (six) hours as needed for muscle spasms. ?  ?multivitamin with minerals tablet ?Take 1 tablet by mouth daily. ?  ?pantoprazole 40 MG tablet ?Commonly known as: PROTONIX ?Take 1 tablet (40 mg total) by mouth daily. ?  ?polyethylene glycol 17 g packet ?Commonly known as: MIRALAX / GLYCOLAX ?Take 17 g by mouth daily as needed for mild constipation. ?  ?SUMAtriptan 50 MG tablet ?Commonly known as: IMITREX ?Take 1 tablet (50 mg total) by mouth as needed. Every 2 hrs as needed ?  ?vitamin C 1000 MG tablet ?Take 1,000 mg by mouth daily. ?  ?Vitamin D3 50 MCG (2000 UT) Tabs ?Take 2,000 Units by mouth daily. ?  ? ?  ? ? ? ?Allergies  ?Allergen Reactions  ? Tapentadol Rash and Other (See Comments)  ?  Breaks patient out  ? ?Nucynta   ?  Paroxetine Hcl Other (See Comments)  ?  Irritability   ? Statins Other (See Comments)  ?  Muscle aches  ? Adhesive [Tape]   ?  Breaks patient out   ? Ciprofloxacin Hives  ? Mannitol Other (See Comments)  ?  Unknown  ? Reclast [Zoledronic Acid] Other (See Comments)  ?  Ended up in hospital/ high fever and   ? Water For Injection [employer], Sterile]   ? Flagyl [Metronidazole] Rash  ? Latex Rash  ?  Irritated skin  ? ? ? ? ?The results of significant diagnostics from this hospitalization (including imaging, microbiology, ancillary and laboratory) are listed below for reference.   ? ?Significant Diagnostic Studies: ?DG Chest 1 View ? ?Result Date: 01/25/2022 ?CLINICAL DATA:  post op weakness EXAM: CHEST  1 VIEW COMPARISON:  01/29/2021 FINDINGS: Cardiomediastinal silhouette and pulmonary vasculature are within normal limits. Lungs are clear. Neurostimulator leads again noted in the midthoracic spine. IMPRESSION: No acute cardiopulmonary process. Electronically Signed   By: Miachel Roux M.D.   On: 01/25/2022 15:00  ? ?US Venous Img Lower Unilateral Left (DVT) ? ?Result Date: 01/25/2022 ?CLINICAL DATA:  Left lower extremity pain and edema. History of  left knee surgery on 01/19/2022. Evaluate for DVT. EXAM: LEFT LOWER EXTREMITY VENOUS DOPPLER ULTRASOUND TECHNIQUE: Gray-scale sonography with graded compression, as well as color Doppler and duplex ultrasound we

## 2022-01-30 NOTE — [place] FL2 (Signed)
?[place] MEDICAID FL2 LEVEL OF CARE SCREENING TOOL  ?  ? ?IDENTIFICATION  ?Patient Name: ?Joel Kim Birthdate: Aug 04, 1946 Sex: male Admission Date (Current Location): ?01/25/2022  ?[place] and [place] Number: ? [place] ?  Facility and Address:  ?[facility],  [address], [place] ?     Provider Number: ?[id number]  ?Attending Physician Name and Address:  ?Nita Sells, MD ? Relative Name and Phone Number:  ?Casados,Joann Spouse [phone]  [phone]  bottoms, tammi Daughter   [phone] ?   ?Current Level of Care: ?Hospital Recommended Level of Care: ?[place] Prior Approval Number: ?  ? ?Date Approved/Denied: ?  PASRR Number: ?[id number] A ? ?Discharge Plan: ?SNF ?  ? ?Current Diagnoses: ?Patient Active Problem List  ? Diagnosis Date Noted  ? 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[place]) 01/25/2022  ? S/P total knee arthroplasty, 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  ? Hypokalemia 01/25/2022  ? Acute anemia 01/25/2022  ? Acute metabolic encephalopathy 70/62/3762  ? 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 Atypical chest pain 08/15/2018  ? Hyperlipidemia 08/15/2018  ? Family history of heart disease 08/15/2018  ? Hypertension 05/31/2018  ? Hypothyroidism 05/31/2018  ? Acute pancreatitis 05/31/2018  ? Mild renal insufficiency 05/31/2018  ? ? ?Orientation RESPIRATION BLADDER Height & Weight   ?  ?Self, Time, Situation, Place ? Normal Continent Weight: 185 lb (83.9 kg) ?Height:  '5\' 4"'$  (162.6 cm)  ?BEHAVIORAL SYMPTOMS/MOOD NEUROLOGICAL BOWEL NUTRITION STATUS  ?    Continent Diet (Cardiac diet)  ?AMBULATORY STATUS COMMUNICATION OF NEEDS Skin   ?Limited Assist Verbally Surgical wounds ?  ?  ?  ?    ?     ?     ? ? ?Personal Care Assistance Level of Assistance  ?Bathing, Feeding, Dressing Bathing Assistance: Limited assistance ?Feeding assistance: Independent ?Dressing Assistance: Limited assistance ?   ? ?Functional Limitations Info  ?Sight, Speech, Hearing Sight Info: Adequate ?Hearing Info:  Adequate ?Speech Info: Adequate  ? ? ?SPECIAL CARE FACTORS FREQUENCY  ?PT (By licensed PT), OT (By licensed OT)   ?  ?PT Frequency: Minimum 5x a week ?OT Frequency: Minimum 5x a week ?  ?  ?  ?   ? ? ?Contractures Contractures Info: Not present  ? ? ?Additional Factors Info  ?Code Status, Allergies Code Status Info: Full Code ?Allergies Info: Tapentadol   Paroxetine Hcl   Statins   Adhesive (Tape)   Ciprofloxacin   Mannitol   Reclast (Zoledronic Acid)   Water For Injection (Water, Sterile)   Flagyl (Metronidazole)   Latex ?  ?  ?  ?   ? ?Current Medications (01/30/2022):  This is the current hospital active medication list ?Current Facility-Administered Medications  ?Medication Dose Route Frequency Provider Last Rate Last Admin  ? acetaminophen (TYLENOL) tablet 650 mg  650 mg Oral Q6H PRN Emokpae, Ejiroghene E, MD      ? Or  ? acetaminophen (TYLENOL) suppository 650 mg  650 mg Rectal Q6H PRN Emokpae, Ejiroghene E, MD      ? amLODipine (NORVASC) tablet 5 mg  5 mg Oral Daily Emokpae, Ejiroghene E, MD   5 mg at 01/30/22 1013  ? apixaban (ELIQUIS) tablet 10 mg  10 mg Oral BID Suzzanne Cloud, RPH   10 mg at 01/30/22 1013  ? Followed by  ? [START ON 02/05/2022] apixaban (ELIQUIS) tablet 5 mg  5 mg Oral BID Suzzanne Cloud, RPH      ? camphor-menthol (SARNA) lotion   Topical PRN Nita Sells, MD      ?  carvedilol (COREG) tablet 12.5 mg  12.5 mg Oral BID WC Nita Sells, MD   12.5 mg at 01/30/22 0901  ? hydrochlorothiazide (HYDRODIURIL) tablet 25 mg  25 mg Oral Daily Emokpae, Ejiroghene E, MD   25 mg at 01/30/22 1013  ? HYDROcodone-acetaminophen (NORCO) 10-325 MG per tablet 1 tablet  1 tablet Oral Q6H Dahal, Binaya, MD   1 tablet at 01/30/22 1015  ? hydrOXYzine (ATARAX) tablet 10 mg  10 mg Oral TID PRN Nita Sells, MD   10 mg at 01/29/22 0920  ? levothyroxine (SYNTHROID) tablet 100 mcg  100 mcg Oral QAC breakfast Emokpae, Ejiroghene E, MD   100 mcg at 01/30/22 0525  ? methocarbamol (ROBAXIN) tablet 500  mg  500 mg Oral Q6H Dahal, Binaya, MD   500 mg at 01/30/22 1014  ? ondansetron (ZOFRAN) tablet 4 mg  4 mg Oral Q6H PRN Emokpae, Ejiroghene E, MD   4 mg at 01/26/22 0508  ? Or  ? ondansetron (ZOFRAN) injection 4 mg  4 mg Intravenous Q6H PRN Emokpae, Ejiroghene E, MD      ? pantoprazole (PROTONIX) EC tablet 40 mg  40 mg Oral Daily Emokpae, Ejiroghene E, MD   40 mg at 01/30/22 1014  ? polyethylene glycol (MIRALAX / GLYCOLAX) packet 17 g  17 g Oral Daily PRN Emokpae, Ejiroghene E, MD      ? potassium chloride SA (KLOR-CON M) CR tablet 40 mEq  40 mEq Oral BID Nita Sells, MD   40 mEq at 01/30/22 1013  ? ? ? ?Discharge Medications: ?Please see discharge summary for a list of discharge medications. ? ?Relevant Imaging Results: ? ?Relevant Lab Results: ? ? ?Additional Information ?SSN [id number] ? ?Ross Ludwig, LCSW ? ? ? ? ?

## 2022-01-31 DIAGNOSIS — I82452 Acute embolism and thrombosis of left peroneal vein: Secondary | ICD-10-CM

## 2022-01-31 MED ORDER — OXYCODONE HCL 10 MG PO TABS: 10.0000 mg | ORAL_TABLET | Freq: Four times a day (QID) | ORAL | 0 refills | Status: AC | PRN

## 2022-01-31 MED ORDER — METHOCARBAMOL 500 MG PO TABS
500.0000 mg | ORAL_TABLET | Freq: Four times a day (QID) | ORAL | Status: DC | PRN
  Administered 2022-01-31: 500 mg via ORAL
  Filled 2022-01-31: qty 1

## 2022-01-31 MED ORDER — HYDROMORPHONE HCL 2 MG PO TABS: 2.0000 mg | ORAL_TABLET | ORAL | 0 refills | Status: AC | PRN

## 2022-01-31 MED ORDER — METHOCARBAMOL 500 MG PO TABS: 500.0000 mg | ORAL_TABLET | Freq: Four times a day (QID) | ORAL | 0 refills | Status: DC

## 2022-01-31 MED ORDER — HYDROMORPHONE HCL 1 MG/ML IJ SOLN
0.5000 mg | Freq: Once | INTRAMUSCULAR | Status: AC
  Administered 2022-01-31: 0.5 mg via INTRAVENOUS
  Filled 2022-01-31: qty 0.5

## 2022-01-31 MED ORDER — ACETAMINOPHEN 500 MG PO TABS: 1000.0000 mg | ORAL_TABLET | Freq: Four times a day (QID) | ORAL | 2 refills | Status: AC | PRN

## 2022-01-31 NOTE — Progress Notes (Signed)
Went over discharge instructions w/ pt and pt wife.  ?

## 2022-01-31 NOTE — Care Management Important Message (Signed)
Important Message ? ?Patient Details IM Letter given to the Patient. ?Name: Joel Kim ?MRN: [id number] ?Date of Birth: 01/13/1946 ? ? ?Medicare Important Message Given:  Yes ? ? ? ? ?Kerin Salen ?01/31/2022, 11:23 AM ?

## 2022-01-31 NOTE — Progress Notes (Signed)
Patient will be discharged to SNF today.  Patient seen and examined at bedside and plan of care discussed with him and wife.  Please refer to the full discharge summary done by Dr. Verlon Au on 01/30/2022 for full details.  Outpatient follow-up with orthopedics. ?

## 2022-01-31 NOTE — Progress Notes (Signed)
Called report to [facility] and spoke w/ Lenord Carbo, LPN.  ?

## 2022-01-31 NOTE — TOC Transition Note (Signed)
Transition of Care (TOC) - CM/SW Discharge Note ? ? ?Patient Details  ?Name: Joel Kim ?MRN: [id number] ?Date of Birth: [birth year] ? ?Transition of Care (TOC) CM/SW Contact:  ?Ross Ludwig, LCSW ?Phone Number: ?01/31/2022, 12:29 PM ? ? ?Clinical Narrative:    ? ?Patient to be d/c'ed today to [facility] room 501-1.  Patient and family agreeable to plans will transport via ems RN to call report [phone].  CSW left message on voice mail of wife to let her know patient is discharging today. ? ? ? ? ?Final next level of care: [place] ?Barriers to Discharge: Barriers Resolved ? ? ?Patient Goals and CMS Choice ?Patient states their goals for this hospitalization and ongoing recovery are:: To go to SNF for rehab, then return back home. ?CMS Medicare.gov Compare Post Acute Care list provided to:: Patient Represenative (must comment) ?Choice offered to / list presented to : Spouse ? ?Discharge Placement ?PASRR number recieved: 01/30/22 ?           ?Patient chooses bed at: [facility] ?Patient to be transferred to facility by: [facility] ?Name of family member notified: Left message on wife's voice mail. ?Patient and family notified of of transfer: 01/31/22 ? ?Discharge Plan and Services ?  ?  ?           ?  ?  ?  ?  ?  ?  ?  ?  ?  ?  ? ?Social Determinants of Health (SDOH) Interventions ?  ? ? ?Readmission Risk Interventions ?   ? View : No data to display.  ?  ?  ?  ? ? ? ? ? ?

## 2022-01-31 NOTE — Progress Notes (Signed)
Subjective: ?Patient is currently being admitted status post left total knee arthroplasty as well as left leg DVT.  He has been on heparin since his admission into the hospital.  That is being managed by hospitalist.  He unfortunately has not been able to pass physical therapy so he is being sent to a SNF.  Overall pain is improving.  Still is having pain in the calf but that is getting better.  Swelling is improving.  ? ?Objective: ?Vital signs in last 24 hours: ?Temp:  [98.4 ?F (36.9 ?C)-98.9 ?F (37.2 ?C)] 98.4 ?F (36.9 ?C) (05/08 0429) ?Pulse Rate:  [78-93] 79 (05/08 1001) ?Resp:  [18-20] 20 (05/08 0429) ?BP: (108-145)/(69-91) 108/73 (05/08 1001) ?SpO2:  [96 %-98 %] 98 % (05/08 0842) ? ?Intake/Output from previous day: ?05/07 0701 - 05/08 0700 ?In: 700 [P.O.:700] ?Out: 2400 [Urine:2400] ?Intake/Output this shift: ?No intake/output data recorded. ? ?Recent Labs  ?  01/29/22 ?0137 01/30/22 ?8177  ?HGB 11.0* 10.4*  ? ?Recent Labs  ?  01/29/22 ?0137 01/30/22 ?1165  ?WBC 18.1* 17.7*  ?RBC 3.29* 3.12*  ?HCT 31.9* 30.5*  ?PLT 510* 496*  ? ?Recent Labs  ?  01/29/22 ?0137 01/30/22 ?7903  ?NA 136 134*  ?K 3.1* 3.3*  ?CL 96* 97*  ?CO2 30 29  ?BUN 14 15  ?CREATININE 0.97 0.95  ?GLUCOSE 111* 122*  ?CALCIUM 8.4* 8.6*  ? ?No results for input(s): LABPT, INR in the last 72 hours. ? ?General: Patient is alert and oriented x3, no acute distress, appears stated age ? ?Musculoskeletal: On exam of his left leg continues to improve compared to previous assessments.  Calf is supple, still tender with palpation to the posterior aspect of the knee.  Bandages intact.  2+ palpable dorsalis pedis and posterior tibialis pulses.  Sensation intact.  Good capillary refill. ? ? ? ?Assessment/Plan: ?Status post left total knee arthroplasty complicated by left leg DVT ? ?At this time patient is going to be admitted to an SNF for rehab purposes.  He is going to be sent on his current home regimen of oxycodone as well as additional prescriptions for  Dilaudid as needed.  He is can continue with his Tylenol.  He has an appointment with [place] next week for a postop appointment.  He will follow-up with [place] then.  DVT management will be continued to be followed by hospitalist now and then his primary care doctor when he gets home.  We will see him in the office next week. ?White blood cells continue to trend downward. No fever noted today. ? ? ?Drue Novel, PA-C ?Dr. Theda Sers ?[facility] ?[phone] ?01/31/2022, 12:12 PM  ? ? ? ? ?

## 2022-02-01 ENCOUNTER — Other Ambulatory Visit: Payer: Self-pay | Admitting: Internal Medicine

## 2022-02-01 MED ORDER — APIXABAN 5 MG PO TABS
10.0000 mg | ORAL_TABLET | Freq: Two times a day (BID) | ORAL | 0 refills | Status: DC
Start: 2022-02-01 — End: 2023-03-06

## 2022-02-01 NOTE — Progress Notes (Signed)
I was informed by the nurse that patient didn't have Eliquis prescription since he signed out from SNF on 01/31/22 and hadn't taken Eliquis yesterday and this morning. I have sent Eliquis prescription '10mg'$  BID for 6 days electronically to his pharmacy. Pharmacy has the subsequent '5mg'$  BID prescription that he will need to take after he finishes the '10mg'$  BID doses. ?

## 2022-02-01 NOTE — Progress Notes (Signed)
Patient was discharged to [facility] on Monday 01/31/2022. He decided that he didn't want to stay at the facility once arriving and left AMA. Pt. Wife Mechele Claude called and stated that patient needed the prescription for Eliquis ('5mg'$  take 2 tablets by mouth twice daily until 02/04/2022) sent to [facility] in [place]. A message was sent to Dr. Starla Link to advise.  ?

## 2022-02-15 ENCOUNTER — Ambulatory Visit: Payer: Medicare HMO | Attending: Physician Assistant

## 2022-02-15 DIAGNOSIS — M25662 Stiffness of left knee, not elsewhere classified: Secondary | ICD-10-CM

## 2022-02-15 DIAGNOSIS — M25562 Pain in left knee: Secondary | ICD-10-CM | POA: Diagnosis present

## 2022-02-15 NOTE — Therapy (Signed)
[facility] [place], [place], [address] Phone: [phone]   Fax:  [phone]  Physical Therapy Evaluation  Patient Details  Name: Joel Kim MRN: [id number] Date of Birth: 1946/07/26 Referring Provider (PT): [place], [place]   Encounter Date: 02/15/2022   PT End of Session - 02/15/22 1217     Visit Number 1    Number of Visits 12    Date for PT Re-Evaluation 03/18/22    PT Start Time 1120    PT Stop Time 1203    PT Time Calculation (min) 43 min    Activity Tolerance Patient tolerated treatment well    Behavior During Therapy [facility] for tasks assessed/performed             Past Medical History:  Diagnosis Date   Asthma    Atypical nevus 01/26/2006   right side of body (slight)   Basal cell carcinoma 11/17/2015   left ear rim tx cx3 58f   Cancer ([place]    Hypertension    Hypothyroidism    Insomnia    Intractable migraine without aura    Melanoma ([place] 10/10/2014   right ant. shoulder (exc)   Neuromuscular disorder ([place]    feet   Pancreatitis    Pre-diabetes    Prostate CA ([place]    Spondylosis of lumbar spine    Stroke ([place] 2021   TIA x 2 some memory loss.   Thyroid disease     Past Surgical History:  Procedure Laterality Date   BACK SURGERY  2013   L4-L5 and a redo in 2014 after a fall   [place] 2008   ROTATOR CUFF REPAIR Left 2021   SPINAL CORD STIMULATOR IMPLANT  2020   TOTAL KNEE ARTHROPLASTY Right 2005   TOTAL KNEE ARTHROPLASTY Left 01/19/2022   Procedure: TOTAL KNEE ARTHROPLASTY;  Surgeon: CSydnee Cabal MD;  Location: [facility];  Service: Orthopedics;  Laterality: Left;  adductor canal 120    There were no vitals filed for this visit.    Subjective Assessment - 02/15/22 1124     Subjective Patient reports that he had a left TKA on 01/19/22. He then developed a DVT and was in the hospital from 5/2-5/8. He notes that this is the first  time having therapy since surgery.    Patient is accompained by: Family member    Pertinent History DVT in left knee, chronic pain, history of left foot surgery    Limitations Walking;Standing    Patient Stated Goals yardwork, getting onto and off his tractor, take care of his animals    Currently in Pain? Yes    Pain Score 6     Pain Location Knee    Pain Orientation Left    Pain Descriptors / Indicators Aching;Sharp    Pain Type Surgical pain    Pain Radiating Towards none    Pain Onset 1 to 4 weeks ago    Pain Frequency Intermittent    Aggravating Factors  walking, standing, moving the knee    Pain Relieving Factors nothing    Effect of Pain on Daily Activities unable to do his daily activities                [facility] 02/15/22 0001       Assessment   Medical Diagnosis Left TKA    Referring Provider (PT) Haus, PA    Onset Date/Surgical Date  01/19/22    Next MD Visit --   3 weeks   Prior Therapy No      Precautions   Precautions None      Restrictions   Weight Bearing Restrictions No      Balance Screen   Has the patient fallen in the past 6 months No    Has the patient had a decrease in activity level because of a fear of falling?  Yes    Is the patient reluctant to leave their home because of a fear of falling?  No      Home Social worker Private residence    Home Access Stairs to enter    Entrance Stairs-Number of Steps 3   step to pattern   Entrance Stairs-Rails Cannot reach both;Right    Home Layout One level    [place] - 2 wheels;Kasandra Knudsen - single point      Prior Function   Level of Independence Independent    Leisure yardwork, caring for his animals      Cognition   Overall Cognitive Status Within Functional Limits for tasks assessed    Attention Focused    Focused Attention Appears intact    Memory Appears intact    Awareness Appears intact    Problem Solving Appears intact      Sensation    Additional Comments Patient reports intermittent tingling in his left foot since surgery      ROM / Strength   AROM / PROM / Strength AROM;PROM      AROM   AROM Assessment Site Knee    Right/Left Knee Right;Left    Right Knee Extension 0    Right Knee Flexion 127    Left Knee Extension 11    Left Knee Flexion 96      PROM   PROM Assessment Site Knee    Right/Left Knee Left    Left Knee Flexion 100      Palpation   Palpation comment TTP: left hip abductors and adductors, gastroc/soleus      Transfers   Transfers Sit to Stand;Stand to Sit    Sit to Stand 6: Modified independent (Device/Increase time);With upper extremity assist   from elevated mat   Stand to Sit 6: Modified independent (Device/Increase time);With upper extremity assist;To elevated surface      Ambulation/Gait   Ambulation/Gait Yes    Ambulation/Gait Assistance 6: Modified independent (Device/Increase time)    Assistive device Rolling walker    Gait Pattern Step-to pattern    Ambulation Surface Level;Indoor    Gait velocity decreased                        Objective measurements completed on examination: See above findings.                     PT Long Term Goals - 02/15/22 1242       PT LONG TERM GOAL #1   Title Patient will be independent with his HEP.    Time 4    Period Weeks    Status New    Target Date 03/15/22      PT LONG TERM GOAL #2   Title Patient will be able to demonstrate at least 120 degrees of active left knee flexion for improved function navigating stairs.    Time 4    Period Weeks    Status New    Target Date 03/15/22  PT LONG TERM GOAL #3   Title Patient will be able to demonstrate active left knee extension with 5 degrees of neutral for improved gait mechanics.    Time 4    Period Weeks    Status New    Target Date 03/15/22      PT LONG TERM GOAL #4   Title Patient will be able to safely ambulate at least 80 feet without an assistive  device.    Time 4    Period Weeks    Status New    Target Date 03/15/22      PT LONG TERM GOAL #5   Title Patient will be able to navigate at least 4 steps with a reciprocal pattern for improved household mobility.    Time 4    Period Weeks    Status New    Target Date 03/15/22                    Plan - 02/15/22 1217     Clinical Impression Statement Patient is a 76 year old male presenting to physical therapy following a left TKA on 01/19/22. He presented with moderate pain severity and irritability. He exhibited reduced left active and PROM with pain being his primary limiting factor. Recommend that he continue with skilled physical therapy to address his remaining impairments to return to his prior level of function.    Personal Factors and Comorbidities Comorbidity 3+;Time since onset of injury/illness/exacerbation    Comorbidities allergies, HTN, OA, history of DVT    Examination-Activity Limitations Locomotion Level;Transfers;Squat;Stairs;Stand;Lift;Caring for Others    Examination-Participation Restrictions Community Activity;Yard Work;Other    Stability/Clinical Decision Making Evolving/Moderate complexity    Clinical Decision Making Moderate    Rehab Potential Fair    PT Frequency 3x / week    PT Duration 4 weeks    PT Treatment/Interventions ADLs/Self Care Home Management;Cryotherapy;Electrical Stimulation;Moist Heat;Neuromuscular re-education;Balance training;Therapeutic exercise;Therapeutic activities;Functional mobility training;Stair training;Gait training;Patient/family education;Manual techniques;Passive range of motion;Scar mobilization;Taping;Vasopneumatic Device    PT Next Visit Plan nustep, quad strengthening, knee AROM, and modalities as needed    PT Home Exercise Plan Access Code: [email or web address]  URL: [email or web address]  Date: 02/15/2022  Prepared by: Jacqulynn Cadet    Exercises  - Supine Heel Slide  - 3 x daily - 7 x weekly - 2 sets - 10 reps  -  Supine Quad Set  - 3 x daily - 7 x weekly - 2 sets - 10 reps - 5 seconds  hold  - Seated Quad Set  - 3 x daily - 7 x weekly - 2 sets - 10 reps - 5 seconds  hold  - Seated Heel Slide  - 3 x daily - 7 x weekly - 2 sets - 10 reps    Consulted and Agree with Plan of Care Patient             Patient will benefit from skilled therapeutic intervention in order to improve the following deficits and impairments:  Abnormal gait, Decreased range of motion, Difficulty walking, Decreased activity tolerance, Pain, Decreased balance, Hypomobility, Increased edema, Decreased strength, Decreased mobility  Visit Diagnosis: Stiffness of left knee, not elsewhere classified  Acute pain of left knee     Problem List Patient Active Problem List   Diagnosis Date Noted   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[place]) 01/25/2022   S/P total knee arthroplasty, 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   Hypokalemia 01/25/2022   Acute anemia 36/64/4034   Acute metabolic encephalopathy 74/25/9563   Osteoarthritis of  left knee 01/19/2022   Atypical chest pain 08/15/2018   Hyperlipidemia 08/15/2018   Family history of heart disease 08/15/2018   Hypertension 05/31/2018   Hypothyroidism 05/31/2018   Acute pancreatitis 05/31/2018   Mild renal insufficiency 05/31/2018    Darlin Coco, PT 02/15/2022, 5:50 PM  [facility] [address] [place], [place], [address] Phone: [phone]   Fax:  [phone]  Name: JAYMON DUDEK MRN: [id number] Date of Birth: [birth year]

## 2022-02-16 ENCOUNTER — Ambulatory Visit: Payer: Medicare HMO | Admitting: Physical Therapy

## 2022-02-16 ENCOUNTER — Encounter: Payer: Self-pay | Admitting: Physical Therapy

## 2022-02-16 DIAGNOSIS — M25662 Stiffness of left knee, not elsewhere classified: Secondary | ICD-10-CM | POA: Diagnosis not present

## 2022-02-16 DIAGNOSIS — M25562 Pain in left knee: Secondary | ICD-10-CM

## 2022-02-16 NOTE — Therapy (Signed)
[facility] [place], [place], [address] Phone: [phone]   Fax:  [phone]  Physical Therapy Treatment  Patient Details  Name: Joel Kim MRN: [id number] Date of Birth: 1946/07/13 Referring Provider (PT): [place], [place]   Encounter Date: 02/16/2022   PT End of Session - 02/16/22 1137     Visit Number 2    Number of Visits 12    Date for PT Re-Evaluation 03/18/22    PT Start Time 0175    PT Stop Time 1204    PT Time Calculation (min) 40 min    Activity Tolerance Patient tolerated treatment well    Behavior During Therapy [facility] for tasks assessed/performed             Past Medical History:  Diagnosis Date   Asthma    Atypical nevus 01/26/2006   right side of body (slight)   Basal cell carcinoma 11/17/2015   left ear rim tx cx3 77f   Cancer ([place]    Hypertension    Hypothyroidism    Insomnia    Intractable migraine without aura    Melanoma ([place] 10/10/2014   right ant. shoulder (exc)   Neuromuscular disorder ([place]    feet   Pancreatitis    Pre-diabetes    Prostate CA ([place]    Spondylosis of lumbar spine    Stroke ([place] 2021   TIA x 2 some memory loss.   Thyroid disease     Past Surgical History:  Procedure Laterality Date   BACK SURGERY  2013   L4-L5 and a redo in 2014 after a fall   [place]   ROTATOR CUFF REPAIR Left 2021   SPINAL CORD STIMULATOR IMPLANT  2020   TOTAL KNEE ARTHROPLASTY Right 2005   TOTAL KNEE ARTHROPLASTY Left 01/19/2022   Procedure: TOTAL KNEE ARTHROPLASTY;  Surgeon: CSydnee Cabal MD;  [facility]: [facility];  Service: Orthopedics;  Laterality: Left;  adductor canal 120    There were no vitals filed for this visit.   Subjective Assessment - 02/16/22 1136     Subjective reports he is still on his meds following DVT.    Patient is accompained by: Family member   Wife, JPricilla Kim  Pertinent History DVT in left knee,  chronic pain, history of left foot surgery    Limitations Walking;Standing    Patient Stated Goals yardwork, getting onto and off his tractor, take care of his animals    Currently in Pain? Yes    Pain Score 4     Pain Location Knee    Pain Orientation Left    Pain Descriptors / Indicators Discomfort    Pain Type Surgical pain    Pain Onset 1 to 4 weeks ago    Pain Frequency Intermittent                [facility] PT Assessment - 02/16/22 0001       Assessment   Medical Diagnosis Left TKA    Referring Provider (PT) Haus, [place]    Onset Date/Surgical Date 01/19/22    Next MD Visit 02/2022    Prior Therapy No      Precautions   Precautions None      Restrictions   Weight Bearing Restrictions No  [place] Adult PT Treatment/Exercise - 02/16/22 0001       Exercises   Exercises Knee/Hip      Knee/Hip Exercises: Aerobic   Nustep L3, seat 8-7 x15 min      Knee/Hip Exercises: Standing   Forward Lunges Left;20 reps    Rocker Board 3 minutes      Knee/Hip Exercises: Supine   Short Arc Quad Sets Strengthening;Left;20 reps    Heel Slides AROM;Left;20 reps      Modalities   Modalities Vasopneumatic      Vasopneumatic   Number Minutes Vasopneumatic  10 minutes    Vasopnuematic Location  Knee    Vasopneumatic Pressure Low    Vasopneumatic Temperature  34/pain and edema                          PT Long Term Goals - 02/15/22 1242       PT LONG TERM GOAL #1   Title Patient will be independent with his HEP.    Time 4    Period Weeks    Status New    Target Date 03/15/22      PT LONG TERM GOAL #2   Title Patient will be able to demonstrate at least 120 degrees of active left knee flexion for improved function navigating stairs.    Time 4    Period Weeks    Status New    Target Date 03/15/22      PT LONG TERM GOAL #3   Title Patient will be able to demonstrate active left knee extension with 5 degrees of neutral  for improved gait mechanics.    Time 4    Period Weeks    Status New    Target Date 03/15/22      PT LONG TERM GOAL #4   Title Patient will be able to safely ambulate at least 80 feet without an assistive device.    Time 4    Period Weeks    Status New    Target Date 03/15/22      PT LONG TERM GOAL #5   Title Patient will be able to navigate at least 4 steps with a reciprocal pattern for improved household mobility.    Time 4    Period Weeks    Status New    Target Date 03/15/22                   Plan - 02/16/22 1202     Clinical Impression Statement Patient presented in clinic with reports of low level L knee pain. Patient continues to have considerable amount of LLE edema throughout the LE from thigh to into forefoot as evidence when he took off his shoes. Patient able to demonstrate fairly good L quad activation for SAQ but able to tolerate without verbalization of pain. Patient's L knee flexion measured with heel slides at max 96 deg. Normal vasopnuematic response noted following removal of the modality.    Personal Factors and Comorbidities Comorbidity 3+;Time since onset of injury/illness/exacerbation    Comorbidities allergies, HTN, OA, history of DVT    Examination-Activity Limitations Locomotion Level;Transfers;Squat;Stairs;Stand;Lift;Caring for Others    Examination-Participation Restrictions Community Activity;Yard Work;Other    Stability/Clinical Decision Making Evolving/Moderate complexity    Rehab Potential Fair    PT Frequency 3x / week    PT Duration 4 weeks    PT Treatment/Interventions ADLs/Self Care Home Management;Cryotherapy;Electrical Stimulation;Moist Heat;Neuromuscular re-education;Balance training;Therapeutic exercise;Therapeutic activities;Functional mobility training;Stair training;Gait  training;Patient/family education;Manual techniques;Passive range of motion;Scar mobilization;Taping;Vasopneumatic Device    PT Next Visit Plan nustep, quad  strengthening, knee AROM, and modalities as needed    PT Home Exercise Plan Access Code: [email or web address]  URL: [email or web address]  Date: 02/15/2022  Prepared by: Jacqulynn Cadet    Exercises  - Supine Heel Slide  - 3 x daily - 7 x weekly - 2 sets - 10 reps  - Supine Quad Set  - 3 x daily - 7 x weekly - 2 sets - 10 reps - 5 seconds  hold  - Seated Quad Set  - 3 x daily - 7 x weekly - 2 sets - 10 reps - 5 seconds  hold  - Seated Heel Slide  - 3 x daily - 7 x weekly - 2 sets - 10 reps    Consulted and Agree with Plan of Care Patient             Patient will benefit from skilled therapeutic intervention in order to improve the following deficits and impairments:  Abnormal gait, Decreased range of motion, Difficulty walking, Decreased activity tolerance, Pain, Decreased balance, Hypomobility, Increased edema, Decreased strength, Decreased mobility  Visit Diagnosis: Stiffness of left knee, not elsewhere classified  Acute pain of left knee     Problem List Patient Active Problem List   Diagnosis Date Noted   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[place]) 01/25/2022   S/P total knee arthroplasty, 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

## 2022-02-18 ENCOUNTER — Ambulatory Visit: Payer: Medicare HMO | Admitting: Physical Therapy

## 2022-02-18 ENCOUNTER — Encounter: Payer: Self-pay | Admitting: Physical Therapy

## 2022-02-18 DIAGNOSIS — M25562 Pain in left knee: Secondary | ICD-10-CM

## 2022-02-18 DIAGNOSIS — M25662 Stiffness of left knee, not elsewhere classified: Secondary | ICD-10-CM | POA: Diagnosis not present

## 2022-02-18 NOTE — Therapy (Signed)
[facility] [place], [place], [address] Phone: [phone]   Fax:  [phone]  Physical Therapy Treatment  Patient Details  Name: Joel Kim MRN: [id number] Date of Birth: Feb 21, 1946 Referring Provider (PT): [place], [place]   Encounter Date: 02/18/2022   PT End of Session - 02/18/22 0954     Visit Number 3    Number of Visits 12    Date for PT Re-Evaluation 03/18/22    PT Start Time 0949    PT Stop Time 1035    PT Time Calculation (min) 46 min    Equipment Utilized During Treatment Other (comment)   FWW   Activity Tolerance Patient tolerated treatment well    Behavior During Therapy [facility] for tasks assessed/performed             Past Medical History:  Diagnosis Date   Asthma    Atypical nevus 01/26/2006   right side of body (slight)   Basal cell carcinoma 11/17/2015   left ear rim tx cx3 32f   Cancer ([place]    Hypertension    Hypothyroidism    Insomnia    Intractable migraine without aura    Melanoma ([place] 10/10/2014   right ant. shoulder (exc)   Neuromuscular disorder ([place]    feet   Pancreatitis    Pre-diabetes    Prostate CA ([place]    Spondylosis of lumbar spine    Stroke ([place] 2021   TIA x 2 some memory loss.   Thyroid disease     Past Surgical History:  Procedure Laterality Date   BACK SURGERY  2013   L4-L5 and a redo in 2014 after a fall   [place] 2008   ROTATOR CUFF REPAIR Left 2021   SPINAL CORD STIMULATOR IMPLANT  2020   TOTAL KNEE ARTHROPLASTY Right 2005   TOTAL KNEE ARTHROPLASTY Left 01/19/2022   Procedure: TOTAL KNEE ARTHROPLASTY;  Surgeon: CSydnee Cabal MD;  [facility];  Service: Orthopedics;  Laterality: Left;  adductor canal 120    There were no vitals filed for this visit.   Subjective Assessment - 02/18/22 0950     Subjective Reports that he still feels more swollen into knee and calf.    Pertinent History DVT  in left knee, chronic pain, history of left foot surgery    Limitations Walking;Standing    Patient Stated Goals yardwork, getting onto and off his tractor, take care of his animals    Currently in Pain? Yes    Pain Score 5     Pain Location Knee    Pain Orientation Left    Pain Descriptors / Indicators Tightness;Discomfort    Pain Type Surgical pain    Pain Onset 1 to 4 weeks ago    Pain Frequency Intermittent                [facility] PT Assessment - 02/18/22 0001       Assessment   Medical Diagnosis Left TKA    Referring Provider (PT) [place], [place]   Onset Date/Surgical Date 01/19/22    Next MD Visit 02/2022    Prior Therapy No      Precautions   Precautions None                           [facility] Adult PT Treatment/Exercise - 02/18/22 0001  Knee/Hip Exercises: Stretches   Passive Hamstring Stretch Left;2 reps;60 seconds      Knee/Hip Exercises: Aerobic   Nustep L3, seat 8-7 x15 min      Knee/Hip Exercises: Standing   Forward Lunges Left;20 reps    Rocker Board 3 minutes      Knee/Hip Exercises: Supine   Short Arc Quad Sets AROM;Left;20 reps      Modalities   Modalities Vasopneumatic      Vasopneumatic   Number Minutes Vasopneumatic  10 minutes    Vasopnuematic Location  Knee    Vasopneumatic Pressure Low    Vasopneumatic Temperature  34/pain and edema                          PT Long Term Goals - 02/15/22 1242       PT LONG TERM GOAL #1   Title Patient will be independent with his HEP.    Time 4    Period Weeks    Status New    Target Date 03/15/22      PT LONG TERM GOAL #2   Title Patient will be able to demonstrate at least 120 degrees of active left knee flexion for improved function navigating stairs.    Time 4    Period Weeks    Status New    Target Date 03/15/22      PT LONG TERM GOAL #3   Title Patient will be able to demonstrate active left knee extension with 5 degrees of neutral for improved gait  mechanics.    Time 4    Period Weeks    Status New    Target Date 03/15/22      PT LONG TERM GOAL #4   Title Patient will be able to safely ambulate at least 80 feet without an assistive device.    Time 4    Period Weeks    Status New    Target Date 03/15/22      PT LONG TERM GOAL #5   Title Patient will be able to navigate at least 4 steps with a reciprocal pattern for improved household mobility.    Time 4    Period Weeks    Status New    Target Date 03/15/22                   Plan - 02/18/22 1209     Clinical Impression Statement Patient presents with mod L knee pain but continued tightness throughout the LLE. No abnormal temperature noted in calf and normal temperature palpable in L knee region. Patient able to tolerate therex fairly well with progression to HS stretch in order to advance knee extension. Normal vasopneumatic response noted following removal of the modality.    Personal Factors and Comorbidities Comorbidity 3+;Time since onset of injury/illness/exacerbation    Comorbidities allergies, HTN, OA, history of DVT    Examination-Activity Limitations Locomotion Level;Transfers;Squat;Stairs;Stand;Lift;Caring for Others    Examination-Participation Restrictions Community Activity;Yard Work;Other    Stability/Clinical Decision Making Evolving/Moderate complexity    Rehab Potential Fair    PT Frequency 3x / week    PT Duration 4 weeks    PT Treatment/Interventions ADLs/Self Care Home Management;Cryotherapy;Electrical Stimulation;Moist Heat;Neuromuscular re-education;Balance training;Therapeutic exercise;Therapeutic activities;Functional mobility training;Stair training;Gait training;Patient/family education;Manual techniques;Passive range of motion;Scar mobilization;Taping;Vasopneumatic Device    PT Next Visit Plan nustep, quad strengthening, knee AROM, and modalities as needed    PT Home Exercise Plan Access Code: [email or web address]  URL:  [email or web address]  Date: 02/15/2022  Prepared by: Jacqulynn Cadet    Exercises  - Supine Heel Slide  - 3 x daily - 7 x weekly - 2 sets - 10 reps  - Supine Quad Set  - 3 x daily - 7 x weekly - 2 sets - 10 reps - 5 seconds  hold  - Seated Quad Set  - 3 x daily - 7 x weekly - 2 sets - 10 reps - 5 seconds  hold  - Seated Heel Slide  - 3 x daily - 7 x weekly - 2 sets - 10 reps    Consulted and Agree with Plan of Care Patient             Patient will benefit from skilled therapeutic intervention in order to improve the following deficits and impairments:  Abnormal gait, Decreased range of motion, Difficulty walking, Decreased activity tolerance, Pain, Decreased balance, Hypomobility, Increased edema, Decreased strength, Decreased mobility  Visit Diagnosis: Stiffness of left knee, not elsewhere classified  Acute pain of left knee     Problem List Patient Active Problem List   Diagnosis Date Noted   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 [place], [place], [address] Phone: [phone]   Fax:  [phone]  Name: Joel Kim MRN: [id number] Date of Birth: Jan 26, 1946

## 2022-02-22 ENCOUNTER — Ambulatory Visit: Payer: Medicare HMO

## 2022-02-22 DIAGNOSIS — M25662 Stiffness of left knee, not elsewhere classified: Secondary | ICD-10-CM | POA: Diagnosis not present

## 2022-02-22 DIAGNOSIS — M25562 Pain in left knee: Secondary | ICD-10-CM

## 2022-02-22 NOTE — Therapy (Signed)
[facility] [place], [place], [address] Phone: [phone]   Fax:  [phone]  Physical Therapy Treatment  Patient Details  Name: Joel Kim MRN: [id number] Date of Birth: 1946/09/11 Referring Provider (PT): [place], [place]   Encounter Date: 02/22/2022   PT End of Session - 02/22/22 1119     Visit Number 4    Number of Visits 12    Date for PT Re-Evaluation 03/18/22    PT Start Time 1116    PT Stop Time 1202    PT Time Calculation (min) 46 min    Equipment Utilized During Treatment Other (comment)   [place]   Activity Tolerance Patient tolerated treatment well    Behavior During Therapy [facility] for tasks assessed/performed             Past Medical History:  Diagnosis Date   Asthma    Atypical nevus 01/26/2006   right side of body (slight)   Basal cell carcinoma 11/17/2015   left ear rim tx cx3 36f   Cancer ([place]    Hypertension    Hypothyroidism    Insomnia    Intractable migraine without aura    Melanoma ([place] 10/10/2014   right ant. shoulder (exc)   Neuromuscular disorder ([place]bron[place]    feet   Pancreatitis    Pre-diabetes    Prostate CA ([place]    Spondylosis of lumbar spine    Stroke ([place] 2021   TIA x 2 some memory loss.   Thyroid disease     Past Surgical History:  Procedure Laterality Date   BACK SURGERY  2013   L4-L5 and a redo in 2014 after a fall   [place] 2008   ROTATOR CUFF REPAIR Left 2021   SPINAL CORD STIMULATOR IMPLANT  2020   TOTAL KNEE ARTHROPLASTY Right 2005   TOTAL KNEE ARTHROPLASTY Left 01/19/2022   Procedure: TOTAL KNEE ARTHROPLASTY;  Surgeon: CSydnee Cabal MD;  [facility];  Service: Orthopedics;  Laterality: Left;  adductor canal 120    There were no vitals filed for this visit.   Subjective Assessment - 02/22/22 1120     Subjective Patient reports that his knee is hurting a little this morning, but he is been moving  around a little more since he has transitioned from his walker to the cane.    Pertinent History DVT in left knee, chronic pain, history of left foot surgery    Limitations Walking;Standing    Patient Stated Goals yardwork, getting onto and off his tractor, take care of his animals    Currently in Pain? Yes    Pain Score 5     Pain Location Knee    Pain Orientation Left    Pain Onset 1 to 4 weeks ago                [facility] - 02/22/22 0001       AROM   Left Knee Extension 9    Left Knee Flexion 100                           [facility] Adult PT Treatment/Exercise - 02/22/22 0001       Knee/Hip Exercises: Stretches   Passive Hamstring Stretch Left;4 reps;30 seconds      Knee/Hip Exercises: Aerobic   Nustep L3 x 15 minutes; seat 9-7  Knee/Hip Exercises: Standing   Hip Flexion Both;20 reps;Knee bent   BUE support   Forward Lunges Left;20 reps   onto 6" step   Rocker Board 3 minutes      Modalities   Modalities Vasopneumatic      Vasopneumatic   Number Minutes Vasopneumatic  10 minutes    Vasopnuematic Location  Knee    Vasopneumatic Pressure Low    Vasopneumatic Temperature  34/pain and edema                          PT Long Term Goals - 02/15/22 1242       PT LONG TERM GOAL #1   Title Patient will be independent with his HEP.    Time 4    Period Weeks    Status New    Target Date 03/15/22      PT LONG TERM GOAL #2   Title Patient will be able to demonstrate at least 120 degrees of active left knee flexion for improved function navigating stairs.    Time 4    Period Weeks    Status New    Target Date 03/15/22      PT LONG TERM GOAL #3   Title Patient will be able to demonstrate active left knee extension with 5 degrees of neutral for improved gait mechanics.    Time 4    Period Weeks    Status New    Target Date 03/15/22      PT LONG TERM GOAL #4   Title Patient will be able to safely ambulate at least  80 feet without an assistive device.    Time 4    Period Weeks    Status New    Target Date 03/15/22      PT LONG TERM GOAL #5   Title Patient will be able to navigate at least 4 steps with a reciprocal pattern for improved household mobility.    Time 4    Period Weeks    Status New    Target Date 03/15/22                   Plan - 02/22/22 1119     Clinical Impression Statement Patient was introduced to marching for improved knee stability with standing activities. He required minmal cueing with the rocker board to facilitate a full arc of motion needed for improved gastroc soft tissue extensibility. He notes that his knee was hurting a little upon the conclusion of treatment, but "not too bad." He continues to require skilled physical therapy to address his remaining impairments to maximize his safety and mobiltiy.    Personal Factors and Comorbidities Comorbidity 3+;Time since onset of injury/illness/exacerbation    Comorbidities allergies, HTN, OA, history of DVT    Examination-Activity Limitations Locomotion Level;Transfers;Squat;Stairs;Stand;Lift;Caring for Others    Examination-Participation Restrictions Community Activity;Yard Work;Other    Stability/Clinical Decision Making Evolving/Moderate complexity    Rehab Potential Fair    PT Frequency 3x / week    PT Duration 4 weeks    PT Treatment/Interventions ADLs/Self Care Home Management;Cryotherapy;Electrical Stimulation;Moist Heat;Neuromuscular re-education;Balance training;Therapeutic exercise;Therapeutic activities;Functional mobility training;Stair training;Gait training;Patient/family education;Manual techniques;Passive range of motion;Scar mobilization;Taping;Vasopneumatic Device    PT Next Visit Plan nustep, quad strengthening, knee AROM, and modalities as needed    PT Home Exercise Plan Access Code: [email or web address]  URL: [email or web address]  Date: 02/15/2022  Prepared by: Jacqulynn Cadet    Exercises  - Supine  Heel  Slide  - 3 x daily - 7 x weekly - 2 sets - 10 reps  - Supine Quad Set  - 3 x daily - 7 x weekly - 2 sets - 10 reps - 5 seconds  hold  - Seated Quad Set  - 3 x daily - 7 x weekly - 2 sets - 10 reps - 5 seconds  hold  - Seated Heel Slide  - 3 x daily - 7 x weekly - 2 sets - 10 reps    Consulted and Agree with Plan of Care Patient             Patient will benefit from skilled therapeutic intervention in order to improve the following deficits and impairments:  Abnormal gait, Decreased range of motion, Difficulty walking, Decreased activity tolerance, Pain, Decreased balance, Hypomobility, Increased edema, Decreased strength, Decreased mobility  Visit Diagnosis: Stiffness of left knee, not elsewhere classified  Acute pain of left knee     Problem List Patient Active Problem List   Diagnosis Date Noted   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 [place], [place], [address] Phone: [phone]   Fax:  [phone]  Name: Joel Kim MRN: [id number] Date of Birth: Jul 30, 1946

## 2022-02-24 ENCOUNTER — Encounter: Payer: Self-pay | Admitting: Physical Therapy

## 2022-02-24 ENCOUNTER — Ambulatory Visit: Payer: Medicare HMO | Attending: Physician Assistant | Admitting: Physical Therapy

## 2022-02-24 DIAGNOSIS — M25562 Pain in left knee: Secondary | ICD-10-CM | POA: Diagnosis present

## 2022-02-24 DIAGNOSIS — M25662 Stiffness of left knee, not elsewhere classified: Secondary | ICD-10-CM | POA: Diagnosis present

## 2022-02-24 NOTE — Therapy (Signed)
[facility] [place], [place], [address] Phone: [phone]   Fax:  [phone]  Physical Therapy Treatment  Patient Details  Name: Joel Kim MRN: [id number] Date of Birth: 1946-04-28 Referring Provider (PT): [place], [place]   Encounter Date: 02/24/2022   PT End of Session - 02/24/22 1121     Visit Number 5    Number of Visits 12    Date for PT Re-Evaluation 03/18/22    PT Start Time 1118    PT Stop Time 1206    PT Time Calculation (min) 48 min    Equipment Utilized During Treatment Other (comment)   [place]   Activity Tolerance Patient tolerated treatment well    Behavior During Therapy [facility] for tasks assessed/performed             Past Medical History:  Diagnosis Date   Asthma    Atypical nevus 01/26/2006   right side of body (slight)   Basal cell carcinoma 11/17/2015   left ear rim tx cx3 25f   Cancer ([place]    Hypertension    Hypothyroidism    Insomnia    Intractable migraine without aura    Melanoma ([place] 10/10/2014   right ant. shoulder (exc)   Neuromuscular disorder ([place]    feet   Pancreatitis    Pre-diabetes    Prostate CA ([place]    Spondylosis of lumbar spine    Stroke ([place] 2021   TIA x 2 some memory loss.   Thyroid disease     Past Surgical History:  Procedure Laterality Date   BACK SURGERY  2013   L4-L5 and a redo in 2014 after a fall   [place] 2008   ROTATOR CUFF REPAIR Left 2021   SPINAL CORD STIMULATOR IMPLANT  2020   TOTAL KNEE ARTHROPLASTY Right 2005   TOTAL KNEE ARTHROPLASTY Left 01/19/2022   Procedure: TOTAL KNEE ARTHROPLASTY;  Surgeon: CSydnee Cabal MD;  Location: [facility];  Service: Orthopedics;  Laterality: Left;  adductor canal 120    There were no vitals filed for this visit.   Subjective Assessment - 02/24/22 1118     Subjective No new complaints.    Patient is accompained by: Family member   wife, JArville Go   Pertinent History DVT in left knee, chronic pain, history of left foot surgery    Limitations Walking;Standing    Patient Stated Goals yardwork, getting onto and off his tractor, take care of his animals    Currently in Pain? Yes    Pain Score 4     Pain Location Knee    Pain Orientation Left    Pain Descriptors / Indicators Discomfort    Pain Type Surgical pain    Pain Onset 1 to 4 weeks ago    Pain Frequency Intermittent                [facility] PT Assessment - 02/24/22 0001       Assessment   Medical Diagnosis Left TKA    Referring Provider (PT) Haus, PA    Onset Date/Surgical Date 01/19/22    Next MD Visit 02/2022    Prior Therapy No      Precautions   Precautions None      Restrictions   Weight Bearing Restrictions No      Observation/Other Assessments-Edema    Edema Circumferential      Circumferential Edema   Circumferential -  Right 40.5 cm    Circumferential - Left  44.4 cm      ROM / Strength   AROM / PROM / Strength AROM      AROM   Overall AROM  Deficits    AROM Assessment Site Knee    Right/Left Knee Left    Left Knee Extension 7    Left Knee Flexion 110                           [facility] Adult PT Treatment/Exercise - 02/24/22 0001       Knee/Hip Exercises: Aerobic   Nustep L3, seat 8-6 x20 min      Knee/Hip Exercises: Standing   Heel Raises Both;15 reps    Forward Lunges Left;20 reps    Hip Abduction AROM;Left;20 reps;Knee straight      Knee/Hip Exercises: Seated   Long Arc Quad Strengthening;Left;20 reps;Weights    Long Arc Quad Weight 3 lbs.      Modalities   Modalities Vasopneumatic      Vasopneumatic   Number Minutes Vasopneumatic  10 minutes    Vasopnuematic Location  Knee    Vasopneumatic Pressure Low    Vasopneumatic Temperature  34/pain and edema                          PT Long Term Goals - 02/24/22 1201       PT LONG TERM GOAL #1   Title Patient will be independent with his HEP.    Time  4    Period Weeks    Status On-going    Target Date 03/15/22      PT LONG TERM GOAL #2   Title Patient will be able to demonstrate at least 120 degrees of active left knee flexion for improved function navigating stairs.    Time 4    Period Weeks    Status On-going    Target Date 03/15/22      PT LONG TERM GOAL #3   Title Patient will be able to demonstrate active left knee extension with 5 degrees of neutral for improved gait mechanics.    Time 4    Period Weeks    Status On-going    Target Date 03/15/22      PT LONG TERM GOAL #4   Title Patient will be able to safely ambulate at least 80 feet without an assistive device.    Time 4    Period Weeks    Status On-going    Target Date 03/15/22      PT LONG TERM GOAL #5   Title Patient will be able to navigate at least 4 steps with a reciprocal pattern for improved household mobility.    Time 4    Period Weeks    Status On-going    Target Date 03/15/22                   Plan - 02/24/22 1202     Clinical Impression Statement Patient presented in clinic with mild L knee pain and more redness along mid to superior half of incision. Incision redness also presenting with tiny bumps. Patient instructed to contact surgeon's office to make them aware. Patient using SPC at this time. Patient able to tolerate all therex well with no complaints. AROM improved to 7-110 deg with swelling of distal femur region limitin. Normal vasopnuematic response noted following removal of the  modality.    Personal Factors and Comorbidities Comorbidity 3+;Time since onset of injury/illness/exacerbation    Comorbidities allergies, HTN, OA, history of DVT    Examination-Activity Limitations Locomotion Level;Transfers;Squat;Stairs;Stand;Lift;Caring for Others    Examination-Participation Restrictions Community Activity;Yard Work;Other    Stability/Clinical Decision Making Evolving/Moderate complexity    Rehab Potential Fair    PT Frequency 3x /  week    PT Duration 4 weeks    PT Treatment/Interventions ADLs/Self Care Home Management;Cryotherapy;Electrical Stimulation;Moist Heat;Neuromuscular re-education;Balance training;Therapeutic exercise;Therapeutic activities;Functional mobility training;Stair training;Gait training;Patient/family education;Manual techniques;Passive range of motion;Scar mobilization;Taping;Vasopneumatic Device    PT Next Visit Plan nustep, quad strengthening, knee AROM, and modalities as needed    PT Home Exercise Plan Access Code: [email or web address]  URL: [email or web address]  Date: 02/15/2022  Prepared by: Jacqulynn Cadet    Exercises  - Supine Heel Slide  - 3 x daily - 7 x weekly - 2 sets - 10 reps  - Supine Quad Set  - 3 x daily - 7 x weekly - 2 sets - 10 reps - 5 seconds  hold  - Seated Quad Set  - 3 x daily - 7 x weekly - 2 sets - 10 reps - 5 seconds  hold  - Seated Heel Slide  - 3 x daily - 7 x weekly - 2 sets - 10 reps    Consulted and Agree with Plan of Care Patient             Patient will benefit from skilled therapeutic intervention in order to improve the following deficits and impairments:  Abnormal gait, Decreased range of motion, Difficulty walking, Decreased activity tolerance, Pain, Decreased balance, Hypomobility, Increased edema, Decreased strength, Decreased mobility  Visit Diagnosis: Stiffness of left knee, not elsewhere classified  Acute pain of left knee     Problem List Patient Active Problem List   Diagnosis Date Noted   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[place]) 01/25/2022   S/P total knee arthroplasty, 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   Hypokalemia 01/25/2022   Acute anemia 00/45/9977   Acute metabolic encephalopathy 41/42/3953   Osteoarthritis of left knee 01/19/2022   Atypical chest pain 08/15/2018   Hyperlipidemia 08/15/2018   Family history of heart disease 08/15/2018   Hypertension 05/31/2018   Hypothyroidism 05/31/2018   Acute pancreatitis 05/31/2018   Mild renal insufficiency 05/31/2018   Rationale  for Evaluation and Treatment Rehabilitation   Standley Brooking, PTA 02/24/2022, 12:15 PM  [facility] [place], [place], [address] Phone: [phone]   Fax:  [phone]  Name: OLIVIER FRAYRE MRN: [id number] Date of Birth: 07/15/1946

## 2022-02-28 ENCOUNTER — Ambulatory Visit: Payer: Medicare HMO | Admitting: Physical Therapy

## 2022-02-28 DIAGNOSIS — M25562 Pain in left knee: Secondary | ICD-10-CM

## 2022-02-28 DIAGNOSIS — M25662 Stiffness of left knee, not elsewhere classified: Secondary | ICD-10-CM | POA: Diagnosis not present

## 2022-02-28 NOTE — Therapy (Signed)
[facility] [place], [place], [address] Phone: [phone]   Fax:  [phone]  Physical Therapy Treatment  Patient Details  Name: Joel Kim MRN: [id number] Date of Birth: 1945/09/27 Referring Provider (PT): [place], [place]   Encounter Date: 02/28/2022   PT End of Session - 02/28/22 1127     Visit Number 6    Number of Visits 12    Date for PT Re-Evaluation 03/18/22    PT Start Time 1120    PT Stop Time 1208    PT Time Calculation (min) 48 min    Equipment Utilized During Treatment Other (comment)   [place]   Activity Tolerance Patient tolerated treatment well    Behavior During Therapy [facility] for tasks assessed/performed             Past Medical History:  Diagnosis Date   Asthma    Atypical nevus 01/26/2006   right side of body (slight)   Basal cell carcinoma 11/17/2015   left ear rim tx cx3 55f   Cancer ([facility]    Hypertension    Hypothyroidism    Insomnia    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 shoulder (exc)   Neuromuscular disorder ([place]    feet   Pancreatitis    Pre-diabetes    Prostate CA ([place]    Spondylosis of lumbar spine    Stroke ([facility] 2021   TIA x 2 some memory loss.   Thyroid disease     Past Surgical History:  Procedure Laterality Date   BACK SURGERY  2013   L4-L5 and a redo in 2014 after a fall   [place] 2008   ROTATOR CUFF REPAIR Left 2021   SPINAL CORD STIMULATOR IMPLANT  2020   TOTAL KNEE ARTHROPLASTY Right 2005   TOTAL KNEE ARTHROPLASTY Left 01/19/2022   Procedure: TOTAL KNEE ARTHROPLASTY;  Surgeon: CSydnee Cabal MD;  [facility];  Service: Orthopedics;  Laterality: Left;  adductor canal 120    There were no vitals filed for this visit.   Subjective Assessment - 02/28/22 1119     Subjective Went to MD regarding the redness of the incision and was prescribed Doxycycline. Told  to monitor for a week. Patient's wife reports that he has complained of more pain over the weekend.    Patient is accompained by: Family member   Wife   Pertinent History DVT in left knee, chronic pain, history of left foot surgery    Limitations Walking;Standing    Patient Stated Goals yardwork, getting onto and off his tractor, take care of his animals    Currently in Pain? Yes    Pain Location Knee    Pain Orientation Left    Pain Descriptors / Indicators Discomfort    Pain Type Surgical pain    Pain Onset 1 to 4 weeks ago    Pain Frequency Intermittent                [facility] PT Assessment - 02/28/22 0001       Assessment   Medical Diagnosis Left TKA    Referring Provider (PT) Haus, PA    Onset Date/Surgical Date 01/19/22    Next MD Visit 02/2022    Prior Therapy No      Precautions   Precautions None      ROM / Strength   AROM / PROM /  Strength AROM      AROM   Overall AROM  Deficits    AROM Assessment Site Knee    Right/Left Knee Left    Left Knee Extension 4   rests at 6 deg                          [facility] Adult PT Treatment/Exercise - 02/28/22 0001       Knee/Hip Exercises: Aerobic   Nustep L3, seat 7 x16 min      Knee/Hip Exercises: Standing   Forward Lunges Left;20 reps    Forward Lunges Limitations 8" step    Rocker Board 3 minutes      Knee/Hip Exercises: Seated   Long Arc Quad Strengthening;Left;20 reps;Weights    Long Arc Quad Weight 4 lbs.    Sit to Sand 15 reps;without UE support      Knee/Hip Exercises: Supine   Heel Prop for Knee Extension 2 minutes      Modalities   Modalities Vasopneumatic      Vasopneumatic   Number Minutes Vasopneumatic  10 minutes    Vasopnuematic Location  Knee    Vasopneumatic Pressure Medium    Vasopneumatic Temperature  34/pain and edema                          PT Long Term Goals - 02/24/22 1201       PT LONG TERM GOAL #1   Title Patient will be independent with his  HEP.    Time 4    Period Weeks    Status On-going    Target Date 03/15/22      PT LONG TERM GOAL #2   Title Patient will be able to demonstrate at least 120 degrees of active left knee flexion for improved function navigating stairs.    Time 4    Period Weeks    Status On-going    Target Date 03/15/22      PT LONG TERM GOAL #3   Title Patient will be able to demonstrate active left knee extension with 5 degrees of neutral for improved gait mechanics.    Time 4    Period Weeks    Status On-going    Target Date 03/15/22      PT LONG TERM GOAL #4   Title Patient will be able to safely ambulate at least 80 feet without an assistive device.    Time 4    Period Weeks    Status On-going    Target Date 03/15/22      PT LONG TERM GOAL #5   Title Patient will be able to navigate at least 4 steps with a reciprocal pattern for improved household mobility.    Time 4    Period Weeks    Status On-going    Target Date 03/15/22                   Plan - 02/28/22 1159     Clinical Impression Statement Patient presented in clinic with reports from wife of patient he complained of more pain over the weekend. Was prescribed Doxycycline by MD for the redness surrounding the incision. Mild redness present prior to exercises and increased during exercise. Slight temperature of L knee noted. Patient able to tolerate therex well with no new complaints. Mild improvement of L knee extension measured with 4 deg actively but resting at 6 deg. Normal vasopnuematic  response noted following removal of the modality.    Personal Factors and Comorbidities Comorbidity 3+;Time since onset of injury/illness/exacerbation    Comorbidities allergies, HTN, OA, history of DVT    Examination-Activity Limitations Locomotion Level;Transfers;Squat;Stairs;Stand;Lift;Caring for Others    Examination-Participation Restrictions Community Activity;Yard Work;Other    Stability/Clinical Decision Making Evolving/Moderate  complexity    Rehab Potential Fair    PT Frequency 3x / week    PT Duration 4 weeks    PT Treatment/Interventions ADLs/Self Care Home Management;Cryotherapy;Electrical Stimulation;Moist Heat;Neuromuscular re-education;Balance training;Therapeutic exercise;Therapeutic activities;Functional mobility training;Stair training;Gait training;Patient/family education;Manual techniques;Passive range of motion;Scar mobilization;Taping;Vasopneumatic Device    PT Next Visit Plan nustep, quad strengthening, knee AROM, and modalities as needed    PT Home Exercise Plan Access Code: [email or web address]  URL: [email or web address]  Date: 02/15/2022  Prepared by: Jacqulynn Cadet    Exercises  - Supine Heel Slide  - 3 x daily - 7 x weekly - 2 sets - 10 reps  - Supine Quad Set  - 3 x daily - 7 x weekly - 2 sets - 10 reps - 5 seconds  hold  - Seated Quad Set  - 3 x daily - 7 x weekly - 2 sets - 10 reps - 5 seconds  hold  - Seated Heel Slide  - 3 x daily - 7 x weekly - 2 sets - 10 reps    Consulted and Agree with Plan of Care Patient             Patient will benefit from skilled therapeutic intervention in order to improve the following deficits and impairments:  Abnormal gait, Decreased range of motion, Difficulty walking, Decreased activity tolerance, Pain, Decreased balance, Hypomobility, Increased edema, Decreased strength, Decreased mobility  Visit Diagnosis: Stiffness of left knee, not elsewhere classified  Acute pain of left knee     Problem List Patient Active Problem List   Diagnosis Date Noted   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[place]) 01/25/2022   S/P total knee arthroplasty, 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   Hypokalemia 01/25/2022   Acute anemia 02/63/7858   Acute metabolic encephalopathy 85/10/7739   Osteoarthritis of left knee 01/19/2022   Atypical chest pain 08/15/2018   Hyperlipidemia 08/15/2018   Family history of heart disease 08/15/2018   Hypertension 05/31/2018   Hypothyroidism 05/31/2018   Acute pancreatitis  05/31/2018   Mild renal insufficiency 05/31/2018   Rationale for Evaluation and Treatment Rehabilitation   Standley Brooking, PTA 02/28/2022, 12:13 PM  [facility] [address] [place], [place], [address] Phone: [phone]   Fax:  [phone]  Name: Joel Kim MRN: [id number] Date of Birth: 1945-12-08

## 2022-03-01 ENCOUNTER — Encounter: Payer: PRIVATE HEALTH INSURANCE | Admitting: Physical Therapy

## 2022-03-04 ENCOUNTER — Encounter: Payer: PRIVATE HEALTH INSURANCE | Admitting: *Deleted

## 2022-03-08 ENCOUNTER — Encounter: Payer: Self-pay | Admitting: Physical Therapy

## 2022-03-08 ENCOUNTER — Ambulatory Visit: Payer: Medicare HMO | Admitting: Physical Therapy

## 2022-03-08 DIAGNOSIS — M25662 Stiffness of left knee, not elsewhere classified: Secondary | ICD-10-CM | POA: Diagnosis not present

## 2022-03-08 DIAGNOSIS — M25562 Pain in left knee: Secondary | ICD-10-CM

## 2022-03-08 NOTE — Therapy (Signed)
[facility] [place], [place], [address] Phone: [phone]   Fax:  [phone]  Physical Therapy Treatment  Patient Details  Name: Joel Kim MRN: [id number] Date of Birth: [birth year] Referring Provider (PT): [place], [place]   Encounter Date: 03/08/2022   PT End of Session - 03/08/22 1214     Visit Number 7    Number of Visits 12    Date for PT Re-Evaluation 03/18/22    PT Start Time 1119    PT Stop Time 1159    PT Time Calculation (min) 40 min    Activity Tolerance Patient tolerated treatment well    Behavior During Therapy [facility] for tasks assessed/performed             Past Medical History:  Diagnosis Date   Asthma    Atypical nevus 01/26/2006   right side of body (slight)   Basal cell carcinoma 11/17/2015   left ear rim tx cx3 77f   Cancer ([place]    Hypertension    Hypothyroidism    Insomnia    Intractable migraine without aura    Melanoma ([facility] 10/10/2014   right ant. shoulder (exc)   Neuromuscular disorder ([facility]    feet   Pancreatitis    Pre-diabetes    Prostate CA ([place]    Spondylosis of lumbar spine    Stroke ([place] 2021   TIA x 2 some memory loss.   Thyroid disease     Past Surgical History:  Procedure Laterality Date   BACK SURGERY  2013   L4-L5 and a redo in 2014 after a fall   [place] 2008   ROTATOR CUFF REPAIR Left 2021   SPINAL CORD STIMULATOR IMPLANT  2020   TOTAL KNEE ARTHROPLASTY Right 2005   TOTAL KNEE ARTHROPLASTY Left 01/19/2022   Procedure: TOTAL KNEE ARTHROPLASTY;  Surgeon: CSydnee Cabal MD;  [facility];  Service: Orthopedics;  Laterality: Left;  adductor canal 120    There were no vitals filed for this visit.   Subjective Assessment - 03/08/22 1151     Subjective Reports that they think he has superficial infection around his incision. Was put on Keflex and goes to ortho again on monday.    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 Other (Comment)   No pain assessment provided               [facility]       Assessment   Medical Diagnosis Left TKA    Referring Provider (PT) Haus, [place]   Onset Date/Surgical Date 01/19/22    Next MD Visit 02/2022    Prior Therapy No      Precautions   Precautions None      Circumferential Edema   Circumferential - Right 45    Circumferential - Left  42.3                           [facility] Adult PT Treatment/Exercise - 03/08/22 0001       Knee/Hip Exercises: Aerobic   Nustep L3, seat 7 x16 min      Knee/Hip Exercises: Standing   Forward  Lunges Left;20 reps    Forward Step Up Left;15 reps;Hand Hold: 2;Step Height: 6"      Knee/Hip Exercises: Supine   Straight Leg Raises Strengthening;Left;10 reps      Modalities   Modalities Vasopneumatic      Vasopneumatic   Number Minutes Vasopneumatic  10 minutes    Vasopnuematic Location  Knee    Vasopneumatic Pressure Medium    Vasopneumatic Temperature  34/pain and edema                          PT Long Term Goals - 02/24/22 1201       PT LONG TERM GOAL #1   Title Patient will be independent with his HEP.    Time 4    Period Weeks    Status On-going    Target Date 03/15/22      PT LONG TERM GOAL #2   Title Patient will be able to demonstrate at least 120 degrees of active left knee flexion for improved function navigating stairs.    Time 4    Period Weeks    Status On-going    Target Date 03/15/22      PT LONG TERM GOAL #3   Title Patient will be able to demonstrate active left knee extension with 5 degrees of neutral for improved gait mechanics.    Time 4    Period Weeks    Status On-going    Target Date 03/15/22      PT LONG TERM  GOAL #4   Title Patient will be able to safely ambulate at least 80 feet without an assistive device.    Time 4    Period Weeks    Status On-going    Target Date 03/15/22      PT LONG TERM GOAL #5   Title Patient will be able to navigate at least 4 steps with a reciprocal pattern for improved household mobility.    Time 4    Period Weeks    Status On-going    Target Date 03/15/22                   Plan - 03/08/22 1216     Clinical Impression Statement Patient presented in clinic with continued redness surrounding the incision and continud edema throughout LLE. 3.5 cm diference with circumferential edema L > R. Antibiotics have changed but to go back to surgeon on 03/14/2022. Patient no longer using AD for gait. Normal vasopneumatic response noted following removal of the modality.    Personal Factors and Comorbidities Comorbidity 3+;Time since onset of injury/illness/exacerbation    Comorbidities allergies, HTN, OA, history of DVT    Examination-Activity Limitations Locomotion Level;Transfers;Squat;Stairs;Stand;Lift;Caring for Others    Examination-Participation Restrictions Community Activity;Yard Work;Other    Stability/Clinical Decision Making Evolving/Moderate complexity    Rehab Potential Fair    PT Frequency 3x / week    PT Duration 4 weeks    PT Treatment/Interventions ADLs/Self Care Home Management;Cryotherapy;Electrical Stimulation;Moist Heat;Neuromuscular re-education;Balance training;Therapeutic exercise;Therapeutic activities;Functional mobility training;Stair training;Gait training;Patient/family education;Manual techniques;Passive range of motion;Scar mobilization;Taping;Vasopneumatic Device    PT Next Visit Plan nustep, quad strengthening, knee AROM, and modalities as needed    PT Home Exercise Plan Access Code: [email or web address]  URL: [email or web address]  Date: 02/15/2022  Prepared by: Jacqulynn Cadet    Exercises  - Supine Heel Slide  - 3 x daily - 7 x  weekly - 2 sets - 10 reps  - Supine Quad  Set  - 3 x daily - 7 x weekly - 2 sets - 10 reps - 5 seconds  hold  - Seated Quad Set  - 3 x daily - 7 x weekly - 2 sets - 10 reps - 5 seconds  hold  - Seated Heel Slide  - 3 x daily - 7 x weekly - 2 sets - 10 reps    Consulted and Agree with Plan of Care Patient             Patient will benefit from skilled therapeutic intervention in order to improve the following deficits and impairments:  Abnormal gait, Decreased range of motion, Difficulty walking, Decreased activity tolerance, Pain, Decreased balance, Hypomobility, Increased edema, Decreased strength, Decreased mobility  Visit Diagnosis: Stiffness of left knee, not elsewhere classified  Acute pain of left knee     Problem List Patient Active Problem List   Diagnosis Date Noted   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[place]) 01/25/2022   S/P total knee arthroplasty, 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   Hypokalemia 01/25/2022   Acute anemia 66/29/4765   Acute metabolic encephalopathy 46/50/3546   Osteoarthritis of left knee 01/19/2022   Atypical chest pain 08/15/2018   Hyperlipidemia 08/15/2018   Family history of heart disease 08/15/2018   Hypertension 05/31/2018   Hypothyroidism 05/31/2018   Acute pancreatitis 05/31/2018   Mild renal insufficiency 05/31/2018   Rationale for Evaluation and Treatment Rehabilitation   Standley Brooking, [place] 03/08/2022, 12:29 PM  [facility] [place], [place], [address] Phone: [phone]   Fax:  [phone]  Name: Joel Kim MRN: [id number] Date of Birth: 20-Sep-1946

## 2022-03-11 ENCOUNTER — Encounter: Payer: PRIVATE HEALTH INSURANCE | Admitting: Physical Therapy

## 2022-03-15 ENCOUNTER — Encounter: Payer: PRIVATE HEALTH INSURANCE | Admitting: *Deleted

## 2022-03-17 ENCOUNTER — Encounter: Payer: PRIVATE HEALTH INSURANCE | Admitting: Physical Therapy

## 2022-04-06 ENCOUNTER — Ambulatory Visit: Payer: Medicare HMO | Attending: Physician Assistant

## 2022-04-06 DIAGNOSIS — M25562 Pain in left knee: Secondary | ICD-10-CM | POA: Diagnosis present

## 2022-04-06 DIAGNOSIS — M25662 Stiffness of left knee, not elsewhere classified: Secondary | ICD-10-CM | POA: Diagnosis not present

## 2022-04-06 NOTE — Therapy (Signed)
OUTPATIENT PHYSICAL THERAPY TREATMENT NOTE   Patient Name: Joel Kim MRN: [id number] DOB:04/27/1946, 76 y.o., male Today's Date: 04/06/2022  REFERRING PROVIDER: Drue Novel, PA   PT End of Session - 04/06/22 1033     Visit Number 8    Number of Visits 12    Date for PT Re-Evaluation 03/18/22    PT Start Time 1030    PT Stop Time 1125    PT Time Calculation (min) 55 min    Activity Tolerance Patient tolerated treatment well    Behavior During Therapy [facility] for tasks assessed/performed             Past Medical History:  Diagnosis Date   Asthma    Atypical nevus 01/26/2006   right side of body (slight)   Basal cell carcinoma 11/17/2015   left ear rim tx cx3 81f   Cancer ([place]    Hypertension    Hypothyroidism    Insomnia    Intractable migraine without aura    Melanoma ([facility] 10/10/2014   right ant. shoulder (exc)   Neuromuscular disorder ([place]    feet   Pancreatitis    Pre-diabetes    Prostate CA ([place]    Spondylosis of lumbar spine    Stroke ([place] 2021   TIA x 2 some memory loss.   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   Hypokalemia 01/25/2022   Acute anemia 034/19/6222  Acute metabolic encephalopathy 097/98/9211  Osteoarthritis of left knee 01/19/2022   Atypical chest pain 08/15/2018   Hyperlipidemia 08/15/2018   Family history of heart disease  08/15/2018   Hypertension 05/31/2018   Hypothyroidism 05/31/2018   Acute pancreatitis 05/31/2018   Mild renal insufficiency 05/31/2018    REFERRING DIAG: Unilateral primary osteoarthritis, left knee  THERAPY DIAG:  Stiffness of left knee, not elsewhere classified  Acute pain of left knee  Rationale for Evaluation and Treatment Rehabilitation  PERTINENT HISTORY: DVT in left knee, chronic pain, history of left foot surgery   PRECAUTIONS: none   SUBJECTIVE: Patient reports that his knee is hurting a little this morning. He feels that his knee is getting better, but his knee still hurts all the time.   PAIN:  Are you having pain? Yes: NPRS scale: 6/10 Pain location: left knee Pain description: hurting     TODAY'S TREATMENT:                                    7/12 EXERCISE LOG  Exercise Repetitions and Resistance Comments  Nustep L5 x 16 minutes   Rocker board  5 minutes    Lateral step up  6" step; LLE only x 15 reps  Progressed to eccentric heel taps  Eccentric heel taps  4" step; LLE only x 25 reps        Blank cell = exercise not performed today   Manual Therapy Soft Tissue Mobilization: left quadriceps, for reduced pain and improved mobility Joint Mobilizations: patellar , grade I-IV for improved mobility    Modalities Vaso: Knee, 34 degrees, 10 mins, Pain   PATIENT EDUCATION: Education details: progress with therapy  Person educated: Patient Education method: Explanation Education comprehension: verbalized understanding   HOME EXERCISE PROGRAM:      PT Long Term Goals - 02/24/22 1201       PT LONG TERM GOAL #1   Title Patient will be independent with his HEP.    Time 4    Period Weeks    Status Achieved   Target Date 03/15/22      PT LONG TERM GOAL #2   Title Patient will be able to demonstrate at least 120 degrees of active left knee flexion for improved function navigating stairs.    Time 4    Period Weeks    Status On-going: 108 degrees    Target Date 03/15/22      PT LONG TERM GOAL #3   Title Patient will be able to demonstrate active left knee extension with 5 degrees of neutral for improved gait mechanics.    Time 4    Period Weeks    Status On-going: 11 degrees from neutral    Target Date 03/15/22      PT LONG TERM GOAL #4   Title Patient will be able to safely ambulate at least 80 feet without an assistive device.    Time 4    Period Weeks    Status Achieved   Target Date 03/15/22      PT LONG TERM GOAL #5   Title Patient will be able to navigate at least 4 steps with a reciprocal pattern for improved household mobility.    Time 4    Period Weeks    Status On-going: reciprocal ascending, but step to descending   Target Date 03/15/22              Plan - 04/06/22 1034     Clinical Impression Statement Patient has made good progress with skilled physical therapy as evidenced by his improved mobility and progress toward his goals. However, he continues to experience increased left knee stiffness and pain. Treatment focused on improved knee mobility and strength needed for functional activities. He required minimal cueing with eccentric step downs for improved eccentric quadriceps control. He reported no significant pain or discomfort with any of today's interventions. He reported that his leg felt about the same upon the conclusion of treatment. He continues to require skilled physical therapy to address his remaining impairments to return to his prior level of function.    Personal Factors and Comorbidities Comorbidity 3+;Time since onset of injury/illness/exacerbation    Comorbidities allergies, HTN, OA, history of DVT    Examination-Activity Limitations Locomotion Level;Transfers;Squat;Stairs;Stand;Lift;Caring for Others    Examination-Participation Restrictions Community Activity;Yard Work;Other    Stability/Clinical Decision Making Evolving/Moderate complexity    Rehab Potential Fair    PT Frequency 3x /  week    PT Duration 4 weeks    PT Treatment/Interventions ADLs/Self Care Home Management;Cryotherapy;Electrical Stimulation;Moist Heat;Neuromuscular re-education;Balance training;Therapeutic exercise;Therapeutic activities;Functional mobility training;Stair training;Gait training;Patient/family education;Manual techniques;Passive range of motion;Scar mobilization;Taping;Vasopneumatic Device    PT Next Visit Plan nustep, quad strengthening, knee AROM, and modalities as needed    PT Home Exercise  Plan Access Code: [email or web address]  URL: [email or web address]  Date: 02/15/2022  Prepared by: Jacqulynn Cadet    Exercises  - Supine Heel Slide  - 3 x daily - 7 x weekly - 2 sets - 10 reps  - Supine Quad Set  - 3 x daily - 7 x weekly - 2 sets - 10 reps - 5 seconds  hold  - Seated Quad Set  - 3 x daily - 7 x weekly - 2 sets - 10 reps - 5 seconds  hold  - Seated Heel Slide  - 3 x daily - 7 x weekly - 2 sets - 10 reps    Consulted and Agree with Plan of Care Patient               Darlin Coco, PT 04/06/2022, 11:57 AM

## 2022-04-06 NOTE — Addendum Note (Signed)
Addended by: Darlin Coco on: 04/06/2022 02:03 PM   Modules accepted: Orders

## 2022-04-11 ENCOUNTER — Encounter: Payer: PRIVATE HEALTH INSURANCE | Admitting: Physical Therapy

## 2022-04-14 ENCOUNTER — Encounter: Payer: Self-pay | Admitting: *Deleted

## 2022-04-14 ENCOUNTER — Ambulatory Visit: Payer: Medicare HMO | Admitting: *Deleted

## 2022-04-14 DIAGNOSIS — M25562 Pain in left knee: Secondary | ICD-10-CM

## 2022-04-14 DIAGNOSIS — M25662 Stiffness of left knee, not elsewhere classified: Secondary | ICD-10-CM | POA: Diagnosis not present

## 2022-04-14 NOTE — Therapy (Signed)
OUTPATIENT PHYSICAL THERAPY TREATMENT NOTE   Patient Name: Joel Kim MRN: [id number] DOB:May 24, 1946, 76 y.o., male Today's Date: 04/14/2022  REFERRING PROVIDER: Drue Novel, PA   PT End of Session - 04/14/22 1526     Visit Number 9    Number of Visits 12    Date for PT Re-Evaluation 05/20/22    PT Start Time 5621    PT Stop Time 1606    PT Time Calculation (min) 51 min             Past Medical History:  Diagnosis Date   Asthma    Atypical nevus 01/26/2006   right side of body (slight)   Basal cell carcinoma 11/17/2015   left ear rim tx cx3 48f   Cancer ([facility] [facility]    Hypertension    Hypothyroidism    Insomnia    Intractable migraine without aura    Melanoma ([place] 10/10/2014   right ant. shoulder (exc)   Neuromuscular disorder ([place]    feet   Pancreatitis    Pre-diabetes    Prostate CA ([place]    Spondylosis of lumbar spine    [place] 2021   TIA x 2 some memory loss.   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   Hypokalemia 01/25/2022   Acute anemia 030/86/5784  Acute metabolic encephalopathy 069/62/9528  Osteoarthritis of left knee 01/19/2022   Atypical chest pain 08/15/2018   Hyperlipidemia 08/15/2018   Family history of heart disease 08/15/2018   Hypertension 05/31/2018   Hypothyroidism 05/31/2018   Acute pancreatitis 05/31/2018   Mild renal  insufficiency 05/31/2018    REFERRING DIAG: Unilateral primary osteoarthritis, left knee  THERAPY DIAG:  Stiffness of left knee, not elsewhere classified  Acute pain of left knee  Rationale for Evaluation and Treatment Rehabilitation  PERTINENT HISTORY: DVT in left knee, chronic pain, history of left foot surgery   PRECAUTIONS: none   SUBJECTIVE: Patient reports that his knee is hurting a little today. Doing better   PAIN:  Are you having pain? Yes: NPRS scale: 4-5/10 Pain location: left knee Pain description: hurting     TODAY'S TREATMENT:                                    04/14/22 EXERCISE LOG  Exercise Repetitions and Resistance Comments  Nustep L5 x 15 minutes   Rocker board  5 minutes    14in box lunge  X15 for flexion stretch   Lateral step up  4in 3x10 Focus on quad control  Eccentric heel taps  4" step; LLE only x 25 reps   LAQ's 4#   x10 hold 5 secs, 5# x10 hold 5secs    Blank cell = exercise  not performed today   Manual Therapy Soft Tissue Mobilization: left quadriceps, for reduced pain and improved mobility , scar massage Joint Mobilizations: patellar , grade I-IV for improved mobility    Modalities  IFC x15 mins80-150hz  for Edema/pain    PATIENT EDUCATION:    HOME EXERCISE PROGRAM:      PT Long Term Goals - 02/24/22 1201       PT LONG TERM GOAL #1   Title Patient will be independent with his HEP.    Time 4    Period Weeks    Status Achieved   Target Date 03/15/22      PT LONG TERM GOAL #2   Title Patient will be able to demonstrate at least 120 degrees of active left knee flexion for improved function navigating stairs.    Time 4    Period Weeks    Status On-going: 115 degrees   Target Date 03/15/22      PT LONG TERM GOAL #3   Title Patient will be able to demonstrate active left knee extension with 5 degrees of neutral for improved gait mechanics.    Time 4    Period Weeks    Status Achieved : 4 degrees from neutral    Target  Date 03/15/22      PT LONG TERM GOAL #4   Title Patient will be able to safely ambulate at least 80 feet without an assistive device.    Time 4    Period Weeks    Status Achieved   Target Date 03/15/22      PT LONG TERM GOAL #5   Title Patient will be able to navigate at least 4 steps with a reciprocal pattern for improved household mobility.    Time 4    Period Weeks    Status On-going: reciprocal ascending, but step to descending   Target Date 03/15/22              Plan - 04/14/22 1034     Clinical Impression Statement Patient arrived today doing faiy well with some c/o tightness/pain LT knee. Rx focused on ROM as well as strengthening for LT knee. IFC for edema/pain end of session. AROM LT knee  4-115 degrees. LTG for extension Met.   Personal Factors and Comorbidities Comorbidity 3+;Time since onset of injury/illness/exacerbation    Comorbidities allergies, HTN, OA, history of DVT    Examination-Activity Limitations Locomotion Level;Transfers;Squat;Stairs;Stand;Lift;Caring for Others    Examination-Participation Restrictions Community Activity;Yard Work;Other    Stability/Clinical Decision Making Evolving/Moderate complexity    Rehab Potential Fair    PT Frequency 3x / week    PT Duration 4 weeks    PT Treatment/Interventions ADLs/Self Care Home Management;Cryotherapy;Electrical Stimulation;Moist Heat;Neuromuscular re-education;Balance training;Therapeutic exercise;Therapeutic activities;Functional mobility training;Stair training;Gait training;Patient/family education;Manual techniques;Passive range of motion;Scar mobilization;Taping;Vasopneumatic Device    PT Next Visit Plan nustep, quad strengthening, knee AROM, and modalities as needed    PT Home Exercise Plan Access Code: [email or web address]  URL: [email or web address]  Date: 02/15/2022  Prepared by: Jacqulynn Cadet    Exercises  - Supine Heel Slide  - 3 x daily - 7 x weekly - 2 sets - 10 reps  - Supine Quad Set  - 3 x  daily - 7 x weekly - 2 sets - 10 reps - 5 seconds  hold  - Seated Quad Set  - 3 x daily - 7 x weekly - 2 sets - 10 reps - 5 seconds  hold  - Seated Heel Slide  - 3  x daily - 7 x weekly - 2 sets - 10 reps    Consulted and Agree with Plan of Care Patient               Kera Deacon,CHRIS, PTA 04/14/2022, 4:24 PM

## 2022-04-21 ENCOUNTER — Ambulatory Visit: Payer: Medicare HMO

## 2022-04-21 DIAGNOSIS — M25662 Stiffness of left knee, not elsewhere classified: Secondary | ICD-10-CM

## 2022-04-21 DIAGNOSIS — M25562 Pain in left knee: Secondary | ICD-10-CM

## 2022-04-21 NOTE — Therapy (Addendum)
OUTPATIENT PHYSICAL THERAPY TREATMENT NOTE   Patient Name: Joel Kim MRN: [id number] DOB:Feb 20, 1946, 76 y.o., male Today's Date: 04/21/2022  REFERRING PROVIDER: Drue Novel, PA   PT End of Session - 04/21/22 1534     Visit Number 10    Number of Visits 12    Date for PT Re-Evaluation 05/20/22    PT Start Time 0175    PT Stop Time 1609    PT Time Calculation (min) 54 min             Past Medical History:  Diagnosis Date   Asthma    Atypical nevus 01/26/2006   right side of body (slight)   Basal cell carcinoma 11/17/2015   left ear rim tx cx3 66f   [place]    Hypertension    Hypothyroidism    Insomnia    Intractable migraine without aura    Melanoma ([place] 10/10/2014   right ant. shoulder (exc)   Neuromuscular disorder ([place]    feet   Pancreatitis    Pre-diabetes    Prostate CA ([place]    Spondylosis of lumbar spine    Stroke ([place] 2021   TIA x 2 some memory loss.   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   Hypokalemia 01/25/2022   Acute anemia 010/25/8527  Acute metabolic encephalopathy 078/24/2353  Osteoarthritis of left knee 01/19/2022   Atypical chest pain 08/15/2018   Hyperlipidemia 08/15/2018   Family history of heart disease 08/15/2018   Hypertension 05/31/2018   Hypothyroidism 05/31/2018   Acute pancreatitis 05/31/2018   Mild renal  insufficiency 05/31/2018    REFERRING DIAG: Unilateral primary osteoarthritis, left knee  THERAPY DIAG:  Stiffness of left knee, not elsewhere classified  Acute pain of left knee  Rationale for Evaluation and Treatment Rehabilitation  PERTINENT HISTORY: DVT in left knee, chronic pain, history of left foot surgery   PRECAUTIONS: none   SUBJECTIVE: Patient reports that his knee is hurting a little today. Doing better   PAIN:  Are you having pain? Yes: NPRS scale: 4-5/10 Pain location: left knee Pain description: hurting     TODAY'S TREATMENT:                                    04/14/22 EXERCISE LOG  Exercise Repetitions and Resistance Comments  Nustep L5 x 15 minutes   Cybex Knee Flexion 40# x 50 reps   Cybex Knee Extension 20# x reps   Rocker board  5 minutes    14in box lunge  x20 for flexion stretch   Lateral step up  4in 3x10 Focus on quad control  Eccentric heel taps  4" step; LLE only x 25 reps   LAQ's  Blank cell = exercise not performed today  Modalities  Date:  Vaso: Knee, 80-150 Hz IFC, 10 mins, Pain and Edema    PATIENT EDUCATION:    HOME EXERCISE PROGRAM:      PT Long Term Goals - 02/24/22 1201       PT LONG TERM GOAL #1   Title Patient will be independent with his HEP.    Time 4    Period Weeks    Status Achieved   Target Date 03/15/22      PT LONG TERM GOAL #2   Title Patient will be able to demonstrate at least 120 degrees of active left knee flexion for improved function navigating stairs.    Time 4    Period Weeks    Status Achieved: 120 degrees   Target Date 03/15/22      PT LONG TERM GOAL #3   Title Patient will be able to demonstrate active left knee extension with 5 degrees of neutral for improved gait mechanics.    Time 4    Period Weeks    Status Achieved : 4 degrees from neutral    Target Date 03/15/22      PT LONG TERM GOAL #4   Title Patient will be able to safely ambulate at least 80 feet without an assistive  device.    Time 4    Period Weeks    Status Achieved   Target Date 03/15/22      PT LONG TERM GOAL #5   Title Patient will be able to navigate at least 4 steps with a reciprocal pattern for improved household mobility.    Time 4    Period Weeks    Status Achieved   Target Date 03/15/22              Plan - 04/14/22 1034     Clinical Impression Statement Pt arrives for today's treatment session denying any pain.  Pt introduced to cybex knee flexion and extension machinery without issue.  Pt requiring min cues for eccentric control.  Pt able to demonstrate reciprocal pattern ascending and descending stairs at facility.  Pt also able to demonstrate 120 degrees of left knee flexion today.  Normal responses to vaso noted upon removal.  Pt denied any pain upon completion of today's treatment session.    Personal Factors and Comorbidities Comorbidity 3+;Time since onset of injury/illness/exacerbation    Comorbidities allergies, HTN, OA, history of DVT    Examination-Activity Limitations Locomotion Level;Transfers;Squat;Stairs;Stand;Lift;Caring for Others    Examination-Participation Restrictions Community Activity;Yard Work;Other    Stability/Clinical Decision Making Evolving/Moderate complexity    Rehab Potential Fair    PT Frequency 3x / week    PT Duration 4 weeks    PT Treatment/Interventions ADLs/Self Care Home Management;Cryotherapy;Electrical Stimulation;Moist Heat;Neuromuscular re-education;Balance training;Therapeutic exercise;Therapeutic activities;Functional mobility training;Stair training;Gait training;Patient/family education;Manual techniques;Passive range of motion;Scar mobilization;Taping;Vasopneumatic Device    PT Next Visit Plan nustep, quad strengthening, knee AROM, and modalities as needed    PT Home Exercise Plan Access Code: [email or web address]  URL: [email or web address]  Date: 02/15/2022  Prepared by: Jacqulynn Cadet    Exercises  - Supine Heel Slide  - 3 x daily - 7  x weekly - 2 sets - 10 reps  - Supine Quad Set  - 3 x daily - 7 x weekly - 2 sets - 10 reps - 5 seconds  hold  - Seated Quad Set  - 3 x daily - 7 x weekly - 2 sets - 10 reps -  5 seconds  hold  - Seated Heel Slide  - 3 x daily - 7 x weekly - 2 sets - 10 reps    Consulted and Agree with Plan of Care Patient            PHYSICAL THERAPY DISCHARGE SUMMARY  Visits from Start of Care: 10  Current functional level related to goals / functional outcomes: Patient was able to meet all of his goals with physical therapy and reported feeling comfortable being discharged at this time.    Remaining deficits: None    Education / Equipment: HEP   Patient agrees to discharge. Patient goals were met. Patient is being discharged due to meeting the stated rehab goals.  Jacqulynn Cadet, PT, DPT     Kathrynn Ducking, PTA 04/21/2022, 4:08 PM

## 2022-05-07 ENCOUNTER — Emergency Department (HOSPITAL_COMMUNITY): Payer: Medicare HMO

## 2022-05-07 ENCOUNTER — Other Ambulatory Visit: Payer: Self-pay

## 2022-05-07 ENCOUNTER — Emergency Department (HOSPITAL_COMMUNITY)
Admission: EM | Admit: 2022-05-07 | Discharge: 2022-05-07 | Disposition: A | Discharge status: Home / Self Care | Payer: Medicare HMO | Attending: Emergency Medicine | Admitting: Emergency Medicine

## 2022-05-07 ENCOUNTER — Encounter (HOSPITAL_COMMUNITY): Payer: Self-pay | Admitting: *Deleted

## 2022-05-07 DIAGNOSIS — J45909 Unspecified asthma, uncomplicated: Secondary | ICD-10-CM | POA: Insufficient documentation

## 2022-05-07 DIAGNOSIS — Z853 Personal history of malignant neoplasm of breast: Secondary | ICD-10-CM | POA: Diagnosis not present

## 2022-05-07 DIAGNOSIS — R079 Chest pain, unspecified: Secondary | ICD-10-CM | POA: Diagnosis not present

## 2022-05-07 DIAGNOSIS — Z91040 Latex allergy status: Secondary | ICD-10-CM | POA: Diagnosis not present

## 2022-05-07 DIAGNOSIS — Z79899 Other long term (current) drug therapy: Secondary | ICD-10-CM | POA: Insufficient documentation

## 2022-05-07 DIAGNOSIS — Z7901 Long term (current) use of anticoagulants: Secondary | ICD-10-CM | POA: Diagnosis not present

## 2022-05-07 LAB — CBC
HCT: 50.1 % (ref 39.0–52.0)
Hemoglobin: 18 g/dL — ABNORMAL HIGH (ref 13.0–17.0)
MCH: 31.4 pg (ref 26.0–34.0)
MCHC: 35.9 g/dL (ref 30.0–36.0)
MCV: 87.3 fL (ref 80.0–100.0)
Platelets: 309 10*3/uL (ref 150–400)
RBC: 5.74 MIL/uL (ref 4.22–5.81)
RDW: 13 % (ref 11.5–15.5)
WBC: 14 10*3/uL — ABNORMAL HIGH (ref 4.0–10.5)
nRBC: 0 % (ref 0.0–0.2)

## 2022-05-07 LAB — BASIC METABOLIC PANEL
Anion gap: 9 (ref 5–15)
BUN: 17 mg/dL (ref 8–23)
CO2: 33 mmol/L — ABNORMAL HIGH (ref 22–32)
Calcium: 10.5 mg/dL — ABNORMAL HIGH (ref 8.9–10.3)
Chloride: 98 mmol/L (ref 98–111)
Creatinine, Ser: 0.98 mg/dL (ref 0.61–1.24)
GFR, Estimated: >60 mL/min (ref >60)
Glucose, Bld: 114 mg/dL — ABNORMAL HIGH (ref 70–99)
Potassium: 3.3 mmol/L — ABNORMAL LOW (ref 3.5–5.1)
Sodium: 140 mmol/L (ref 135–145)

## 2022-05-07 LAB — TROPONIN I (HIGH SENSITIVITY)
Troponin I (High Sensitivity): 10 ng/L (ref <18)
Troponin I (High Sensitivity): 9 ng/L (ref <18)

## 2022-05-07 MED ORDER — MORPHINE SULFATE (PF) 4 MG/ML IV SOLN
2.0000 mg | Freq: Once | INTRAVENOUS | Status: AC
  Administered 2022-05-07: 2 mg via INTRAVENOUS

## 2022-05-07 MED ORDER — IOHEXOL 350 MG/ML SOLN
100.0000 mL | Freq: Once | INTRAVENOUS | Status: AC | PRN
  Administered 2022-05-07: 100 mL via INTRAVENOUS

## 2022-05-07 MED ORDER — MORPHINE SULFATE (PF) 4 MG/ML IV SOLN
2.0000 mg | Freq: Once | INTRAVENOUS | Status: DC
  Filled 2022-05-07: qty 1

## 2022-05-07 MED ORDER — LABETALOL HCL 5 MG/ML IV SOLN
5.0000 mg | Freq: Once | INTRAVENOUS | Status: AC
  Administered 2022-05-07: 5 mg via INTRAVENOUS
  Filled 2022-05-07: qty 4

## 2022-05-07 MED ORDER — POTASSIUM CHLORIDE CRYS ER 20 MEQ PO TBCR
40.0000 meq | EXTENDED_RELEASE_TABLET | Freq: Once | ORAL | Status: AC
  Administered 2022-05-07: 40 meq via ORAL
  Filled 2022-05-07: qty 2

## 2022-05-07 NOTE — ED Notes (Signed)
Patient transported to CT 

## 2022-05-07 NOTE — Discharge Instructions (Signed)
Please return to the ED with any new symptoms such as increased chest pain or shortness of breath Please begin taking 10 mg of amlodipine daily  Please follow-up with your PCP for further management of your blood pressure Please read the attached guide concerning nonspecific chest pain in adults

## 2022-05-07 NOTE — ED Provider Notes (Signed)
[facility] EMERGENCY DEPARTMENT Provider Note   CSN: [id number] Arrival date & time: 05/07/22  1402     History  Chief Complaint  Patient presents with   Chest Pain    Joel Kim is a 76 y.o. male with medical history of melanoma, asthma, prostate cancer, hypertension, DVT in LLE in May.  Patient presents to ED for evaluation of chest pain.  Patient reports that for the last 2 to 3 days he has had centralized chest pain that does not radiate, is not associated with shortness of breath, does not worsen with exertion.  The patient reports that the chest pain is "steady" and does not matter if he is sitting or standing.  Patient reports that the chest pain feels as if someone is sitting on his chest.  Patient rates the pain at 4 out of 10.  Patient reports that he is also been hypertensive for the last 1 week.  The patient was placed on 10 mg of amlodipine in addition to his HCTZ this past Monday.  Patient wife at bedside reports the patient has continued to be hypertensive despite addition of amlodipine.  Patient recently admitted and discharged from this facility due to acute DVT of left lower extremity.  The patient was on Eliquis for 3 months, was recently taken off of Eliquis 2 weeks ago.  The patient denies any shortness of breath, fevers, nausea or vomiting, diarrhea, abdominal pain, lightheadedness or dizziness, weakness.   Chest Pain Associated symptoms: no abdominal pain, no dizziness, no fever, no nausea, no shortness of breath, no vomiting and no weakness        Home Medications Prior to Admission medications   Medication Sig Start Date End Date Taking? Authorizing Provider  acetaminophen (TYLENOL) 500 MG tablet Take 2 tablets (1,000 mg total) by mouth every 6 (six) hours as needed. 01/31/22 01/31/23  Drue Novel, PA  albuterol (PROVENTIL HFA;VENTOLIN HFA) 108 (90 Base) MCG/ACT inhaler Inhale 2 puffs into the lungs every 6 (six) hours as needed. Patient taking differently:  Inhale 2 puffs into the lungs every 6 (six) hours as needed for wheezing or shortness of breath. 05/13/18   Rancour, Annie Main, MD  amLODipine (NORVASC) 5 MG tablet Take 1 tablet by mouth daily. 01/17/22   [provider]  apixaban (ELIQUIS) 5 MG TABS tablet Take 1 tablet (5 mg total) by mouth 2 (two) times daily. 02/05/22   Nita Sells, MD  apixaban (ELIQUIS) 5 MG TABS tablet Take 2 tablets (10 mg total) by mouth 2 (two) times daily for 6 days. 02/01/22 02/07/22  Aline August, MD  Ascorbic Acid (VITAMIN C) 1000 MG tablet Take 1,000 mg by mouth daily.    [provider]  carvedilol (COREG) 12.5 MG tablet Take 1 tablet (12.5 mg total) by mouth 2 (two) times daily with a meal. 01/30/22   Nita Sells, MD  Cholecalciferol (VITAMIN D3) 50 MCG (2000 UT) TABS Take 2,000 Units by mouth daily.    [provider]  hydrochlorothiazide (HYDRODIURIL) 25 MG tablet Take 25 mg by mouth daily.    [provider]  hydrOXYzine (ATARAX) 10 MG tablet Take 1 tablet (10 mg total) by mouth 3 (three) times daily as needed for itching. 01/30/22   Nita Sells, MD  levothyroxine (SYNTHROID) 100 MCG tablet Take 100 mcg by mouth daily before breakfast. 12/06/18   [provider]  methocarbamol (ROBAXIN) 500 MG tablet Take 1 tablet (500 mg total) by mouth every 6 (six) hours as needed  for muscle spasms. 01/20/22   Drue Novel, PA  methocarbamol (ROBAXIN) 500 MG tablet Take 1 tablet (500 mg total) by mouth 4 (four) times daily. 01/31/22   Drue Novel, PA  Multiple Vitamins-Minerals (MULTIVITAMIN WITH MINERALS) tablet Take 1 tablet by mouth daily.    [provider]  pantoprazole (PROTONIX) 40 MG tablet Take 1 tablet (40 mg total) by mouth daily. 01/21/13   Chipper Herb, MD  polyethylene glycol (MIRALAX / GLYCOLAX) 17 g packet Take 17 g by mouth daily as needed for mild constipation. 01/30/22   Nita Sells, MD  SUMAtriptan (IMITREX) 50 MG tablet Take  1 tablet (50 mg total) by mouth as needed. Every 2 hrs as needed 06/17/19   Garvin Fila, MD      Allergies    Tapentadol; Paroxetine hcl; Statins; Adhesive [tape]; Ciprofloxacin; Mannitol; Reclast [zoledronic acid]; Water for injection [water, sterile]; Flagyl [metronidazole]; and Latex    Review of Systems   Review of Systems  Constitutional:  Negative for fever.  Respiratory:  Negative for shortness of breath.   Cardiovascular:  Positive for chest pain.  Gastrointestinal:  Negative for abdominal pain, diarrhea, nausea and vomiting.  Neurological:  Negative for dizziness, weakness and light-headedness.  All other systems reviewed and are negative.   Physical Exam Updated Vital Signs BP (!) 178/100   Pulse 80   Temp 98 F (36.7 C) (Oral)   Resp 14   Ht '5\' 4"'$  (1.626 m)   Wt 79.8 kg   SpO2 92%   BMI 30.21 kg/m  Physical Exam Vitals and nursing note reviewed.  Constitutional:      General: He is not in acute distress.    Appearance: Normal appearance. He is not ill-appearing, toxic-appearing or diaphoretic.  HENT:     Head: Normocephalic and atraumatic.     Nose: Nose normal. No congestion.     Mouth/Throat:     Mouth: Mucous membranes are moist.     Pharynx: Oropharynx is clear. No oropharyngeal exudate.  Eyes:     Extraocular Movements: Extraocular movements intact.     Conjunctiva/sclera: Conjunctivae normal.     Pupils: Pupils are equal, round, and reactive to light.  Cardiovascular:     Rate and Rhythm: Normal rate and regular rhythm.  Pulmonary:     Effort: Pulmonary effort is normal.     Breath sounds: Normal breath sounds. No wheezing.  Abdominal:     General: Abdomen is flat. Bowel sounds are normal.     Palpations: Abdomen is soft.     Tenderness: There is no abdominal tenderness.  Musculoskeletal:     Cervical back: Normal range of motion and neck supple. No tenderness.     Right lower leg: No edema.     Left lower leg: No edema.  Skin:    General:  Skin is warm and dry.     Capillary Refill: Capillary refill takes less than 2 seconds.  Neurological:     General: No focal deficit present.     Mental Status: He is alert and oriented to person, place, and time.     GCS: GCS eye subscore is 4. GCS verbal subscore is 5. GCS motor subscore is 6.     Cranial Nerves: Cranial nerves 2-12 are intact. No cranial nerve deficit.     Sensory: Sensation is intact. No sensory deficit.     Motor: Motor function is intact. No weakness.     Coordination: Coordination is intact. Heel to  Shin Test normal.     Gait: Gait is intact.     ED Results / Procedures / Treatments   Labs (all labs ordered are listed, but only abnormal results are displayed) Labs Reviewed  BASIC METABOLIC PANEL - Abnormal; Notable for the following components:      Result Value   Potassium 3.3 (*)    CO2 33 (*)    Glucose, Bld 114 (*)    Calcium 10.5 (*)    All other components within normal limits  CBC - Abnormal; Notable for the following components:   WBC 14.0 (*)    Hemoglobin 18.0 (*)    All other components within normal limits  TROPONIN I (HIGH SENSITIVITY)  TROPONIN I (HIGH SENSITIVITY)    EKG EKG Interpretation  Date/Time:  Saturday May 07 2022 14:28:38 EDT Ventricular Rate:  75 PR Interval:  204 QRS Duration: 94 QT Interval:  410 QTC Calculation: 457 R Axis:   -5 Text Interpretation: Normal sinus rhythm Incomplete right bundle branch block Possible Lateral infarct , age undetermined Inferior infarct , age undetermined Abnormal ECG When compared with ECG of 29-Jan-2021 16:07, QRS axis Confirmed by Dorie Rank [phone]) on 05/07/2022 5:59:01 PM  Radiology CT Angio Chest PE W and/or Wo Contrast  Result Date: 05/07/2022 CLINICAL DATA:  Chest pain EXAM: CT ANGIOGRAPHY CHEST WITH CONTRAST TECHNIQUE: Multidetector CT imaging of the chest was performed using the standard protocol during bolus administration of intravenous contrast. Multiplanar CT image  reconstructions and MIPs were obtained to evaluate the vascular anatomy. RADIATION DOSE REDUCTION: This exam was performed according to the departmental dose-optimization program which includes automated exposure control, adjustment of the mA and/or kV according to patient size and/or use of iterative reconstruction technique. CONTRAST:  147m OMNIPAQUE IOHEXOL 350 MG/ML SOLN COMPARISON:  Previous studies including chest radiograph done earlier today FINDINGS: Cardiovascular: There is homogeneous enhancement in thoracic aorta. There is no demonstrable intimal flap. Scattered coronary artery calcifications are seen. There are no intraluminal filling defects in pulmonary artery branches. Mediastinum/Nodes: No significant lymphadenopathy is seen. Lungs/Pleura: There is no focal pulmonary consolidation. There is no pleural effusion or pneumothorax. Upper Abdomen: There is fatty infiltration in liver. Stomach is distended, possibly due to recent meal. There is possible 2 mm calculus in the upper pole of right kidney. Musculoskeletal: No acute findings are seen. There is pain control lead in lower thoracic spinal canal. Review of the MIP images confirms the above findings. IMPRESSION: There is no evidence of pulmonary artery embolism. There is no evidence of thoracic aortic dissection. There is no focal pulmonary consolidation. Fatty liver. There is small right renal stone. Distention of stomach may be due to recent meal. Electronically Signed   By: PElmer PickerM.D.   On: 05/07/2022 16:28   DG Chest Portable 1 View  Result Date: 05/07/2022 CLINICAL DATA:  Chest pain EXAM: PORTABLE CHEST 1 VIEW COMPARISON:  01/25/2022 FINDINGS: Cardiac size is within normal limits. There are no signs of pulmonary edema or focal pulmonary consolidation. Thoracic aorta is tortuous and ectatic. Pain control leads are noted in thoracic spinal canal. There is gaseous distention of stomach. IMPRESSION: There are no signs of pulmonary  edema or focal pulmonary consolidation. There is gaseous distention of stomach. Electronically Signed   By: PElmer PickerM.D.   On: 05/07/2022 15:13    Procedures Procedures   Medications Ordered in ED Medications  labetalol (NORMODYNE) injection 5 mg (5 mg Intravenous Given 05/07/22 1533)  potassium chloride SA (KLOR-CON  M) CR tablet 40 mEq (40 mEq Oral Given 05/07/22 1614)  iohexol (OMNIPAQUE) 350 MG/ML injection 100 mL (100 mLs Intravenous Contrast Given 05/07/22 1554)  morphine (PF) 4 MG/ML injection 2 mg (2 mg Intravenous Given 05/07/22 1537)  labetalol (NORMODYNE) injection 5 mg (5 mg Intravenous Given 05/07/22 1715)    ED Course/ Medical Decision Making/ A&P                           Medical Decision Making Amount and/or Complexity of Data Reviewed Labs: ordered. Radiology: ordered.  Risk Prescription drug management.   65 male presents to the ED for evaluation.  Please see HPI for further details.  On examination, the patient is afebrile and nontachycardic.  The patient is hypertensive with a blood pressure of 162/104.  The patient lung sounds are clear bilaterally, he is not hypoxic on room air.  The patient abdomen is soft and compressible all 4 quadrants.  The patient does not have any pitting edema bilaterally to lower extremities.  Patient neurological examination shows no focal neurodeficits.  The patient is nontoxic in appearance, the patient is calm and at rest.  Patient worked up utilizing the following labs and imaging studies interpreted by me personally: - CBC with increased white blood cell count of 14, chart reviewed and it seems that this patient always has some degree of leukocytosis.  The patient had a leukocytosis to 18 3 months ago when he came in for a DVT - BMP with decreased potassium to 3.3 repleted with 40 mEq of potassium orally - Patient delta troponin 10 and 9 respectively - Patient chest x-ray shows gaseous distention of stomach however does not  show any signs of consolidation, effusion, mediastinal widening - CT angio chest shows no evidence of pulmonary emboli  Patient treated with 5 mg labetalol x2, 2 mg morphine for pain.  The patient reports after morphine was given his pain decreased from a 4 out of 10 to a 1 out of 10.  After 10 mg total of labetalol were given the patient's blood pressure has decreased however the patient is still hypertensive.  After discussion with my attending Dr. Tomi Bamberger, we have decided to have this patient increase his blood pressure medication to 10 mg of amlodipine daily.  The patient will be encouraged to follow-up with his PCP for further management of his blood pressure.  The patient and his wife at the bedside were given return precautions to include increased chest pain, shortness of breath and they voiced understanding of these instructions.  The patient and his wife at the bedside had all of their questions answered to their satisfaction prior to discharge.  The patient is stable at this time for discharge home.  Final Clinical Impression(s) / ED Diagnoses Final diagnoses:  Chest pain, unspecified type    Rx / DC Orders ED Discharge Orders     None         Lawana Chambers 05/07/22 1823    Margette Fast, MD 05/08/22 1811

## 2022-05-07 NOTE — ED Triage Notes (Signed)
Pt with mid CP last night and today.  Pt with elevated BP at home, last at 1330 171/104.

## 2022-06-21 ENCOUNTER — Ambulatory Visit: Payer: Medicare HMO | Admitting: Physician Assistant

## 2022-06-28 ENCOUNTER — Ambulatory Visit: Payer: Medicare HMO | Admitting: Physician Assistant

## 2022-11-07 ENCOUNTER — Encounter: Payer: Self-pay | Admitting: Orthopedic Surgery

## 2022-11-07 ENCOUNTER — Ambulatory Visit: Payer: Self-pay

## 2022-11-07 ENCOUNTER — Ambulatory Visit: Payer: Medicare HMO | Admitting: Orthopedic Surgery

## 2022-11-07 ENCOUNTER — Ambulatory Visit (INDEPENDENT_AMBULATORY_CARE_PROVIDER_SITE_OTHER): Payer: Medicare HMO

## 2022-11-07 DIAGNOSIS — M25511 Pain in right shoulder: Secondary | ICD-10-CM | POA: Diagnosis not present

## 2022-11-07 DIAGNOSIS — M19011 Primary osteoarthritis, right shoulder: Secondary | ICD-10-CM | POA: Diagnosis not present

## 2022-11-07 MED ORDER — BUPIVACAINE HCL 0.5 % IJ SOLN
9.0000 mL | INTRAMUSCULAR | Status: AC | PRN
  Administered 2022-11-07: 9 mL via INTRA_ARTICULAR

## 2022-11-07 MED ORDER — METHYLPREDNISOLONE ACETATE 40 MG/ML IJ SUSP
40.0000 mg | INTRAMUSCULAR | Status: AC | PRN
  Administered 2022-11-07: 40 mg via INTRA_ARTICULAR

## 2022-11-07 MED ORDER — LIDOCAINE HCL 1 % IJ SOLN
5.0000 mL | INTRAMUSCULAR | Status: AC | PRN
  Administered 2022-11-07: 5 mL

## 2022-11-07 NOTE — Progress Notes (Signed)
Office Visit Note   Patient: Joel Kim           Date of Birth: 1946-06-03           MRN: [id number] Visit Date: 11/07/2022 Requested by: [name], MD [address] [place],  [place] [address] PCP: [name], MD  Subjective: Chief Complaint  Patient presents with   Right Shoulder - Pain    HPI: Joel Kim is a 77 y.o. male who presents to the office reporting right shoulder pain.  He sleeps on the right-hand side and it gives him pain.  Describes decreased range of motion.  Pain started few months ago.  Denies a history of injury.  Has a history of left shoulder surgery several years ago.  He states that "years ago" he fell on his right shoulder and had significant ecchymosis.  He does have a history of back fusion as well as total knee arthroplasty complicated by deep vein thrombosis.  He is in pain management and takes opioids on a daily basis.  Also has a spinal stimulator which prevents him from getting an MRI scan..                ROS: All systems reviewed are negative as they relate to the chief complaint within the history of present illness.  Patient denies fevers or chills.  Assessment & Plan: Visit Diagnoses:  1. Right shoulder pain, unspecified chronicity   2. Arthritis of right shoulder region     Plan: Impression is right shoulder pain with some degree of arthritis present which appears to be mild.  Rotator cuff strength feels symmetric bilaterally except for slight decreased and subscap strength on the right compared to the left.  Plan at this time is ultrasound-guided right glenohumeral joint injection in 6-week return with decision for or against MRI scanning at that time.  Follow-Up Instructions: No follow-ups on file.   Orders:  Orders Placed This Encounter  Procedures   XR Shoulder Right   US Guided Needle Placement - No Linked Charges   No orders of the defined types were placed in this encounter.      Procedures: Large Joint Inj: R glenohumeral on 11/07/2022 8:37 PM Indications: diagnostic evaluation and pain Details: 22 G 1.5 in needle, ultrasound-guided posterior approach  Arthrogram: No  Medications: 9 mL bupivacaine 0.5 %; 40 mg methylPREDNISolone acetate 40 MG/ML; 5 mL lidocaine 1 % Outcome: tolerated well, no immediate complications Procedure, treatment alternatives, risks and benefits explained, specific risks discussed. Consent was given by the patient. Immediately prior to procedure a time out was called to verify the correct patient, procedure, equipment, support staff and site/side marked as required. Patient was prepped and draped in the usual sterile fashion.       Clinical Data: No additional findings.  Objective: Vital Signs: There were no vitals taken for this visit.  Physical Exam:  Constitutional: Patient appears well-developed HEENT:  Head: Normocephalic Eyes:EOM are normal Neck: Normal range of motion Cardiovascular: Normal rate Pulmonary/chest: Effort normal Neurologic: Patient is alert Skin: Skin is warm Psychiatric: Patient has normal mood and affect  Ortho Exam: Ortho exam demonstrates range of motion on the right of 80/95/170.  Rotator cuff strength is 5 out of 5 external rotation with 5- out of 5 subscap strength on the right.  O'Brien's testing equivocal on the right negative on the left.  No discrete AC joint tenderness is present.  No masses lymphadenopathy or skin  changes noted in that shoulder girdle region.  Specialty Comments:  No specialty comments available.  Imaging: No results found.   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   Hypokalemia 01/25/2022   Acute anemia Q000111Q   Acute metabolic encephalopathy [id number]   Osteoarthritis of left knee 01/19/2022   Atypical chest pain 08/15/2018   Hyperlipidemia 08/15/2018    Family history of heart disease 08/15/2018   Hypertension 05/31/2018   Hypothyroidism 05/31/2018   Acute pancreatitis 05/31/2018   Mild renal insufficiency 05/31/2018   Past Medical History:  Diagnosis Date   Asthma    Atypical nevus 01/26/2006   right side of body (slight)   Basal cell carcinoma 11/17/2015   left ear rim tx cx3 47f   [place]    Hypertension    Hypothyroidism    Insomnia    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 shoulder (exc)   Ne[place]romuscular disorder ([facility]    [place]   Pancreatitis    Pre-diabetes    Prostate CA ([facility]    Spondylosis of lumbar spine    [place]roke ([facility] 2021   TIA x 2 some memory loss.   Thyroid disease     No family history on file.  Past Surgical History:  Procedure Laterality Date   BACK SURGERY  2013   L4-L5 and a redo in 2014 after a fall   [facility] [place] 2008   ROTATOR CUFF REPAIR Left 2021   SPINAL CORD STIMULATOR IMPLANT  2020   TOTAL KNEE ARTHROPLASTY Right 2005   TOTAL KNEE ARTHROPLASTY Left 01/19/2022   Procedure: TOTAL KNEE ARTHROPLASTY;  Surgeon: CSydnee Cabal MD;  Location: [facility];  Service: Orthopedics;  Laterality: Left;  adductor canal 120   Social History   Occupational History   Not on file  Tobacco Use   Smoking status: Never   Smokeless tobacco: Never  Vaping Use   Vaping Use: Never used  Substance and Sexual Activity   Alcohol use: No   Drug use: No   Sexual activity: Not on file

## 2022-12-19 ENCOUNTER — Ambulatory Visit: Payer: Medicare HMO | Admitting: Orthopedic Surgery

## 2023-01-06 ENCOUNTER — Ambulatory Visit: Payer: Medicare HMO | Admitting: Surgical

## 2023-01-06 DIAGNOSIS — M25511 Pain in right shoulder: Secondary | ICD-10-CM

## 2023-01-08 ENCOUNTER — Encounter: Payer: Self-pay | Admitting: Surgical

## 2023-01-08 NOTE — Progress Notes (Signed)
Follow-up Office Visit Note   Patient: Joel Kim           Date of Birth: Feb 02, 1946           MRN: [id number] Visit Date: 01/06/2023 Requested by: Eartha Inch, MD [address] [place],  [place] [address] PCP: Eartha Inch, MD  Subjective: Chief Complaint  Patient presents with   Right Shoulder - Follow-up    HPI: Joel Kim is a 77 y.o. male who returns to the office for follow-up visit.    Plan at last visit was: Impression is right shoulder pain with some degree of arthritis present which appears to be mild. Rotator cuff strength feels symmetric bilaterally except for slight decreased and subscap strength on the right compared to the left. Plan at this time is ultrasound-guided right glenohumeral joint injection in 6-week return with decision for or against MRI scanning at that time.   Since then, patient notes he had 100% relief of his right shoulder pain from the glenohumeral injection that lasted for about 2 to 3 weeks.  He has now returned to how the pain was when he saw Dr. August Saucer previously.  Mostly localizes pain to the lateral and anterior aspects of the shoulder.  Cannot lay on his right side.  Salonpas patches are helping.  No new injury.  No neck pain, scapular pain, radicular pain, numbness/tingling.              ROS: All systems reviewed are negative as they relate to the chief complaint within the history of present illness.  Patient denies fevers or chills.  Assessment & Plan: Visit Diagnoses:  1. Right shoulder pain, unspecified chronicity     Plan: DMITRI Kim is a 77 y.o. male who returns to the office for follow-up visit for right shoulder pain.  Plan from last visit was noted above in HPI.  They now return with continued right shoulder pain though he had 100% relief of the pain from glenohumeral injection which lasted for 2 to 3 weeks.  Plan is to proceed with MRI arthrogram of the right shoulder for further evaluation of  rotator cuff tendinopathy versus bicep tendon pathology versus occult arthritis.  He does have a spinal cord stimulator which is MR conditional.  We scan the medical device card today and if he is not able to have an MRI scan due to this stimulator, we will proceed with CT arthrogram of the right shoulder.  Follow-up after imaging with Dr. August Saucer  Follow-Up Instructions: No follow-ups on file.   Orders:  Orders Placed This Encounter  Procedures   MR SHOULDER RIGHT W CONTRAST   Arthrogram   No orders of the defined types were placed in this encounter.     Procedures: No procedures performed   Clinical Data: No additional findings.  Objective: Vital Signs: There were no vitals taken for this visit.  Physical Exam:  Constitutional: Patient appears well-developed HEENT:  Head: Normocephalic Eyes:EOM are normal Neck: Normal range of motion Cardiovascular: Normal rate Pulmonary/chest: Effort normal Neurologic: Patient is alert Skin: Skin is warm Psychiatric: Patient has normal mood and affect  Ortho Exam: Ortho exam demonstrates right shoulder with fairly well-preserved passive and active range of motion.  He has good strength to supraspinatus, infraspinatus strength testing as well as good strength with subscapularis testing though this reproduces a moderate amount of pain.  He has tenderness over the bicipital groove moderate to severely.  Positive Alois Cliche  sign.  No tenderness over the [facility] joint or acromion.  Specialty Comments:  No specialty comments available.  Imaging: No results found.   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   Hypokalemia 01/25/2022   Acute anemia 01/25/2022   Acute metabolic encephalopathy 01/25/2022   Osteoarthritis of left knee 01/19/2022   Atypical chest pain 08/15/2018   Hyperlipidemia 08/15/2018   Family history of heart disease 08/15/2018    Hypertension 05/31/2018   Hypothyroidism 05/31/2018   Acute pancreatitis 05/31/2018   Mild renal insufficiency 05/31/2018   Past Medical History:  Diagnosis Date   Asthma    Atypical nevus 01/26/2006   right side of body (slight)   Basal cell carcinoma 11/17/2015   left ear rim tx cx3 6fu   Cancer ([facility])    Hypertension    Hypothyroidism    Insomnia    Intractable migraine without aura    Melanoma ([facility]) 10/10/2014   right ant. shoulder (exc)   Neuromuscular disorder ([facility])    feet   Pancreatitis    Pre-diabetes    Prostate CA ([facility])    Spondylosis of lumbar spine    Stroke ([facility]) 2021   TIA x 2 some memory loss.   Thyroid disease     No family history on file.  Past Surgical History:  Procedure Laterality Date   BACK SURGERY  2013   L4-L5 and a redo in 2014 after a fall   CHOLECYSTECTOMY  1992   FOOT SURGERY Left 1999   PROSTATECTOMY  2008   ROTATOR CUFF REPAIR Left 2021   SPINAL CORD STIMULATOR IMPLANT  2020   TOTAL KNEE ARTHROPLASTY Right 2005   TOTAL KNEE ARTHROPLASTY Left 01/19/2022   Procedure: TOTAL KNEE ARTHROPLASTY;  Surgeon: Eugenia Mcalpine, MD;  Location: [facility];  Service: Orthopedics;  Laterality: Left;  adductor canal 120   Social History   Occupational History   Not on file  Tobacco Use   Smoking status: Never   Smokeless tobacco: Never  Vaping Use   Vaping Use: Never used  Substance and Sexual Activity   Alcohol use: No   Drug use: No   Sexual activity: Not on file

## 2023-01-26 ENCOUNTER — Telehealth: Payer: Self-pay | Admitting: Orthopedic Surgery

## 2023-01-26 NOTE — Telephone Encounter (Signed)
This has been taken care- I sent a mychart message to pt

## 2023-01-26 NOTE — Telephone Encounter (Signed)
Pt's wife Chyrl Civatte called requesting a referral be sent to [facility] for CT Scan for right shoulder. Please call pt about this matter at [phone].

## 2023-02-08 ENCOUNTER — Ambulatory Visit (HOSPITAL_COMMUNITY)
Admission: RE | Admit: 2023-02-08 | Discharge: 2023-02-08 | Disposition: A | Discharge status: Home / Self Care | Payer: Medicare HMO | Source: Ambulatory Visit | Attending: Surgical | Admitting: Surgical

## 2023-02-08 DIAGNOSIS — M25511 Pain in right shoulder: Secondary | ICD-10-CM | POA: Insufficient documentation

## 2023-02-08 MED ORDER — IOHEXOL 350 MG/ML SOLN
75.0000 mL | Freq: Once | INTRAVENOUS | Status: AC | PRN
  Administered 2023-02-08: 75 mL via INTRAVENOUS

## 2023-02-17 ENCOUNTER — Ambulatory Visit: Payer: Medicare HMO | Admitting: Surgical

## 2023-02-17 DIAGNOSIS — M19011 Primary osteoarthritis, right shoulder: Secondary | ICD-10-CM

## 2023-02-19 ENCOUNTER — Encounter: Payer: Self-pay | Admitting: Surgical

## 2023-02-19 NOTE — Progress Notes (Signed)
Office Visit Note   Patient: Joel Kim           Date of Birth: [birth year]           MRN: [id number] Visit Date: 02/17/2023 Requested by: Eartha Inch, MD [address] [place],  [place] [address] PCP: Eartha Inch, MD  Subjective: Chief Complaint  Patient presents with   Right Shoulder - Follow-up    HPI: Joel Kim is a 77 y.o. male who presents to the office for CT review. Patient denies any changes in symptoms.  Continues to complain mainly of lateral shoulder pain.  Has history of prior surgery on the left shoulder but no history of surgery on the right shoulder.  He is here today to review right shoulder CT scan.  Had great relief from glenohumeral injection but only lasted for 2 to 3 weeks when this was done by Dr. August Saucer several months ago.  He is on chronic pain medication.  States that he has worst shoulder pain with reaching back behind him but bringing his arm forward does not really cause any significant pain              ROS: All systems reviewed are negative as they relate to the chief complaint within the history of present illness.  Patient denies fevers or chills.  Assessment & Plan: Visit Diagnoses:  1. Arthritis of right shoulder region     Plan: Joel Kim is a 77 y.o. male who presents to the office to review CT arthrogram of the right shoulder.  This demonstrates fairly significant primarily posterior glenohumeral osteoarthritis with joint space narrowing and subchondral cyst formation in the glenoid.  We discussed the options available to patient including but not limited to living with his symptoms versus repeat glenohumeral injection every 3 to 4 months as needed versus shoulder replacement.  He would like to continue living with his symptoms since the injection only lasted for about 3 weeks.  It is causing a fair amount of discomfort but it is not worth it for him to have surgery just yet.  He will follow-up with the office as  needed.  Follow-Up Instructions: No follow-ups on file.   Orders:  No orders of the defined types were placed in this encounter.  No orders of the defined types were placed in this encounter.     Procedures: No procedures performed   Clinical Data: No additional findings.  Objective: Vital Signs: There were no vitals taken for this visit.  Physical Exam:  Constitutional: Patient appears well-developed HEENT:  Head: Normocephalic Eyes:EOM are normal Neck: Normal range of motion Cardiovascular: Normal rate Pulmonary/chest: Effort normal Neurologic: Patient is alert Skin: Skin is warm Psychiatric: Patient has normal mood and affect  Ortho Exam: Ortho exam demonstrates right shoulder with no change in range of motion compared with previous exam.  No cellulitis or skin changes noted over the right shoulder.  He has good strength of supra, infra, subscap rated 5/5.  Does have pain with passive motion of the shoulder primarily at the end of range of motion.  Moderate tenderness over the bicipital groove.  No tenderness over the [facility] joint.  Specialty Comments:  No specialty comments available.  Imaging: No results found.   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   Hypokalemia 01/25/2022   Acute anemia 01/25/2022  Acute metabolic encephalopathy 01/25/2022   Osteoarthritis of left knee 01/19/2022   Atypical chest pain 08/15/2018   Hyperlipidemia 08/15/2018   Family history of heart disease 08/15/2018   Hypertension 05/31/2018   Hypothyroidism 05/31/2018   Acute pancreatitis 05/31/2018   Mild renal insufficiency 05/31/2018   Past Medical History:  Diagnosis Date   Asthma    Atypical nevus 01/26/2006   right side of body (slight)   Basal cell carcinoma 11/17/2015   left ear rim tx cx3 25fu   Cancer (HCC)    Hypertension    Hypothyroidism    Insomnia     Intractable migraine without aura    Melanoma (HCC) 10/10/2014   right ant. shoulder (exc)   Neuromuscular disorder (HCC)    feet   Pancreatitis    Pre-diabetes    Prostate CA (HCC)    Spondylosis of lumbar spine    Stroke (HCC) 2021   TIA x 2 some memory loss.   Thyroid disease     No family history on file.  Past Surgical History:  Procedure Laterality Date   BACK SURGERY  2013   L4-L5 and a redo in 2014 after a fall   CHOLECYSTECTOMY  1992   FOOT SURGERY Left 1999   PROSTATECTOMY  2008   ROTATOR CUFF REPAIR Left 2021   SPINAL CORD STIMULATOR IMPLANT  2020   TOTAL KNEE ARTHROPLASTY Right 2005   TOTAL KNEE ARTHROPLASTY Left 01/19/2022   Procedure: TOTAL KNEE ARTHROPLASTY;  Surgeon: Eugenia Mcalpine, MD;  Location: [facility];  Service: Orthopedics;  Laterality: Left;  adductor canal 120   Social History   Occupational History   Not on file  Tobacco Use   Smoking status: Never   Smokeless tobacco: Never  Vaping Use   Vaping Use: Never used  Substance and Sexual Activity   Alcohol use: No   Drug use: No   Sexual activity: Not on file

## 2023-03-06 ENCOUNTER — Other Ambulatory Visit (HOSPITAL_COMMUNITY): Payer: Self-pay

## 2023-03-06 ENCOUNTER — Telehealth: Payer: Self-pay

## 2023-03-06 ENCOUNTER — Ambulatory Visit: Payer: Medicare HMO | Admitting: Neurology

## 2023-03-06 ENCOUNTER — Encounter: Payer: Self-pay | Admitting: Neurology

## 2023-03-06 VITALS — BP 144/75 | HR 65 | Ht 63.0 in | Wt 161.2 lb

## 2023-03-06 DIAGNOSIS — R519 Headache, unspecified: Secondary | ICD-10-CM | POA: Diagnosis not present

## 2023-03-06 DIAGNOSIS — R413 Other amnesia: Secondary | ICD-10-CM

## 2023-03-06 DIAGNOSIS — G3184 Mild cognitive impairment, so stated: Secondary | ICD-10-CM

## 2023-03-06 DIAGNOSIS — Z8673 Personal history of transient ischemic attack (TIA), and cerebral infarction without residual deficits: Secondary | ICD-10-CM

## 2023-03-06 DIAGNOSIS — E0842 Diabetes mellitus due to underlying condition with diabetic polyneuropathy: Secondary | ICD-10-CM | POA: Diagnosis not present

## 2023-03-06 DIAGNOSIS — G43711 Chronic migraine without aura, intractable, with status migrainosus: Secondary | ICD-10-CM

## 2023-03-06 MED ORDER — NURTEC 75 MG PO TBDP: 1.0000 | ORAL_TABLET | Freq: Every day | ORAL | 3 refills | Status: DC

## 2023-03-06 MED ORDER — DIVALPROEX SODIUM ER 500 MG PO TB24
500.0000 mg | ORAL_TABLET | Freq: Every day | ORAL | 3 refills | Status: DC
Start: 2023-03-06 — End: 2023-12-01

## 2023-03-06 NOTE — Progress Notes (Signed)
[facility] [address] [place]. [place] [address] [phone]       OFFICE CONSULT NOTE  [name] Date of Birth:  03-May-1946 Medical Record Number:  [id number]   Referring MD:  Veverly Fells, PA-c  Reason for Referral: Headache  HPI: [name] is a 77 year old pleasant Caucasian male seen today for office consultation visit for headaches.  History is obtained from him and review of electronic medical records and I personally reviewed pertinent available imaging films in PACS.  He has past medical history of hypertension, hypothyroidism, insomnia, chronic migraine headaches with aura, pancreatitis, prediabetes, prostate cancer, TIA in 2021 and asthma.  Patient has had a chronic lifelong history of migraine headaches which were initially intermittent but since the last 5 to 7 years have become daily.  They have transformed and headaches vary in severity on the day to be basis.  The headaches are bifrontal and periorbital mostly mild to moderate in severity but present throughout the day at times they can be severe.  He has been taking Imitrex for years which works only partially.  I last saw him in 2020 when he was on nortriptyline for prophylaxis and I recommended he switch to Topamax but he is not sure why he s not taking it.  He also has chronic back pain for which he takes oxycodone and Robaxin.  He states his headaches have particularly worsened in the last few months.  He has a bad severe headache every 2 to 3 days or so.  He takes Imitrex 50 mg which gives him partial relief and occasionally has to take a second dose.  He does complain of light sensitivity and some nausea with his headaches.  On inquiry admits to occasionally feeling off balance as well as having some word finding difficulties.  He states 3 years ago he had episode of possible TIA.  He was on a hunting trip and noticed that he was suddenly off balance and had poor coordination along with a  headache.  It lasted a couple of days.  He did not seek medical help at that time.  He has a prior history of TIA 15 years ago and has been placed on aspirin but a year ago he developed DVT and was switched to Eliquis following knee surgery.  He had a recent cardiac cath and was found to have coronary calcification with only 25 to 40% stenosis and no stenting was done.  He is currently on Repatha injections and aspirin as well as Eliquis.  Complains of minor bruising but no bleeding.  He also complains of short-term memory difficulties which are mild and related to recent information.  Is still mostly independent in all activities of daily living.  Is still driving.  He has not tried medications like Depakote or more recent medication like Nurtec for his headache.  He has not had any recent brain imaging study done. ROS:   14 system review of systems is positive for headache, word finding difficulty, imbalance, bruising, muscle aches, tingling, numbness, gait difficulty all other systems negative  PMH:  Past Medical History:  Diagnosis Date   Asthma    Atypical nevus 01/26/2006   right side of body (slight)   Basal cell carcinoma 11/17/2015   left ear rim tx cx3 51fu   Cancer (HCC)    Hypertension    Hypothyroidism    Insomnia    Intractable migraine without aura    Melanoma (HCC) 10/10/2014   right ant.  shoulder (exc)   Neuromuscular disorder (HCC)    feet   Pancreatitis    Pre-diabetes    Prostate CA (HCC)    Spondylosis of lumbar spine    Stroke (HCC) 2021   TIA x 2 some memory loss.   Thyroid disease     Social History:  Social History   Socioeconomic History   Marital status: Married    Spouse name: Not on file   Number of children: Not on file   Years of education: Not on file   Highest education level: Not on file  Occupational History   Not on file  Tobacco Use   Smoking status: Never   Smokeless tobacco: Never  Vaping Use   Vaping Use: Never used  Substance and  Sexual Activity   Alcohol use: No   Drug use: No   Sexual activity: Not on file  Other Topics Concern   Not on file  Social History Narrative   Not on file   Social Determinants of Health   Financial Resource Strain: Not on file  Food Insecurity: Not on file  Transportation Needs: Not on file  Physical Activity: Not on file  Stress: Not on file  Social Connections: Not on file  Intimate Partner Violence: Not on file    Medications:   Current Outpatient Medications on File Prior to Visit  Medication Sig Dispense Refill   albuterol (PROVENTIL HFA;VENTOLIN HFA) 108 (90 Base) MCG/ACT inhaler Inhale 2 puffs into the lungs every 6 (six) hours as needed. (Patient taking differently: Inhale 2 puffs into the lungs every 6 (six) hours as needed for wheezing or shortness of breath.) 1 Inhaler 0   Ascorbic Acid (VITAMIN C) 1000 MG tablet Take 1,000 mg by mouth daily.     Aspirin 81 MG CAPS Take 1 capsule every day by oral route.     Cholecalciferol (VITAMIN D3) 50 MCG (2000 UT) TABS Take 2,000 Units by mouth daily.     hydrochlorothiazide (HYDRODIURIL) 25 MG tablet Take 25 mg by mouth daily.     levothyroxine (SYNTHROID) 100 MCG tablet Take 100 mcg by mouth daily before breakfast.     lisinopril (ZESTRIL) 5 MG tablet Take 5 mg by mouth daily.     metoprolol succinate (TOPROL-XL) 50 MG 24 hr tablet Take 1 tablet by mouth daily.     MOUNJARO 5 MG/0.5ML Pen Inject into the skin.     Multiple Vitamins-Minerals (MULTIVITAMIN WITH MINERALS) tablet Take 1 tablet by mouth daily.     mupirocin ointment (BACTROBAN) 2 % Apply to affected area 3 times daily     oxyCODONE ER (XTAMPZA ER) 13.5 MG C12A take 1 capsule by oral route  every 12 hours (DNF 01/11/23)     Oxycodone HCl 10 MG TABS Take 10 mg by mouth 4 (four) times daily as needed.     pantoprazole (PROTONIX) 40 MG tablet Take 1 tablet (40 mg total) by mouth daily. 30 tablet 3   polyethylene glycol (MIRALAX / GLYCOLAX) 17 g packet Take 17 g by  mouth daily as needed for mild constipation. 14 each 0   ranolazine (RANEXA) 500 MG 12 hr tablet Take 500 mg by mouth 2 (two) times daily.     REPATHA SURECLICK 140 MG/ML SOAJ Inject into the skin.     sildenafil (REVATIO) 20 MG tablet      SUMAtriptan (IMITREX) 50 MG tablet Take 1 tablet (50 mg total) by mouth as needed. Every 2 hrs as needed 10 tablet  0   traZODone (DESYREL) 50 MG tablet 75 mg.     valACYclovir (VALTREX) 1000 MG tablet      No current facility-administered medications on file prior to visit.    Allergies:   Allergies  Allergen Reactions   Tapentadol Rash and Other (See Comments)    Breaks patient out   Nucynta    Paroxetine Hcl Other (See Comments)    Irritability    Statins Other (See Comments)    Muscle aches   Adhesive [Tape]     Breaks patient out    Ciprofloxacin Hives   Mannitol Other (See Comments)    Unknown   Reclast [Zoledronic Acid] Other (See Comments)    Ended up in hospital/ high fever and    Water For Injection [Water, Sterile]    Flagyl [Metronidazole] Rash   Latex Rash    Irritated skin    Physical Exam General: well developed, well nourished pleasant elderly Caucasian male, seated, in no evident distress Head: head normocephalic and atraumatic.   Neck: supple with no carotid or supraclavicular bruits Cardiovascular: regular rate and rhythm, no murmurs Musculoskeletal: no deformity Skin:  no rash/petichiae Vascular:  Normal pulses all extremities  Neurologic Exam Mental Status: Awake and fully alert. Oriented to place and time. Recent and remote memory intact. Attention span, concentration and fund of knowledge appropriate. Mood and affect appropriate.  Cranial Nerves: Fundoscopic exam reveals sharp disc margins. Pupils equal, briskly reactive to light. Extraocular movements full without nystagmus. Visual fields full to confrontation. Hearing intact. Facial sensation intact. Face, tongue, palate moves normally and symmetrically.   Motor: Normal bulk and tone. Normal strength in all tested extremity muscles. Sensory.: intact to touch , pinprick , position and vibratory sensation.  Coordination: Rapid alternating movements normal in all extremities. Finger-to-nose and heel-to-shin performed accurately bilaterally. Gait and Station: Arises from chair without difficulty. Stance is normal. Gait demonstrates normal stride length and balance . Able to heel, toe and tandem walk without difficulty.  Reflexes: 1+ and symmetric. Toes downgoing.      ASSESSMENT: 77 year old Caucasian male with longstanding history of chronic daily headaches which likely represent transformed migraine headaches with analgesic rebound and tension headache components.  Remote history of TIA in 2012 and vascular risk factors of hypertension, hyperlipidemia, sleep apnea.  He also has mild cognitive impairment.     PLAN:I had a long the patient and his wife regarding his longstanding chronic daily headaches which appear to be chronic from migraine headaches with mixed tension headache and analgesic rebound.  And his remote history of TIA.  I recommend he avoid taking over-the-counter analgesics and minimize using narcotics for his chronic back pain.  Trial of Depakote ER 500 mg daily for headache prophylaxis as well as Nurtec 75 mg daily for his headache.  He may minimize using Imitrex to not more than once or twice a week.  I encouraged him to increase participation in regular activities for stress relaxation like exercise, meditation and yoga.  Also do neck stretching exercises for muscle tension. Continue aspirin for stroke prevention and maintain aggressive risk factor modification with strict control of hypertension with blood pressure goal below 130/90, lipids with LDL cholesterol goal below 70 mg percent and diabetes with hemoglobin A1c goal below 6.5%.  Check pending lipid profile, hemoglobin A1c and MRI scan of the brain and CT angiogram of the brain  and neck for stroke risk stratification.  Also check memory panel labs for his mild cognitive impairment.  Return for  follow-up in the future with my nurse practitioner in 3 months or call earlier if necessary.  Greater than 50% time during this 60-minute consultation visit was spent in counseling and coordination of care about his migraines and chronic daily headaches and history of TIA and mild cognitive impairment and answering questions Delia Heady, MD  Note: This document was prepared with digital dictation and possible smart phrase technology. Any transcriptional errors that result from this process are unintentional.

## 2023-03-06 NOTE — Telephone Encounter (Signed)
   The diagnosis code I see per chart notes is R51.9-I do not know that it will get approved if it is not listed as a chronic migraine DX code-Please advise.

## 2023-03-06 NOTE — Patient Instructions (Signed)
I had a long the patient and his wife regarding his longstanding chronic daily headaches which appear to be chronic from migraine headaches with mixed tension headache and energetic rebound. I recommend he avoid taking over-the-counter analgesics and minimize using narcotics for his chronic back pain.  Trial of Depakote ER 500 mg daily for headache prophylaxis as well as Nurtec 75 mg daily for his headache.  He may minimize using Imitrex to not more than once or twice a week.  I encouraged him to increase participation in regular activities for stress relaxation like exercise, meditation and yoga.  Also do neck stretching exercises for muscle tension. Continue aspirin for stroke prevention and maintain aggressive risk factor modification with strict control of hypertension with blood pressure goal below 130/90, lipids with LDL cholesterol goal below 70 mg percent and diabetes with hemoglobin A1c goal below 6.5%.  Check pending lipid profile, hemoglobin A1c and MRI scan of the brain and CT angiogram of the brain and neck for stroke risk stratification.  Also check memory panel labs for his mild cognitive impairment.  Return for follow-up in the future with my nurse practitioner in 3 months or call earlier if necessary.

## 2023-03-07 LAB — LIPID PANEL
Chol/HDL Ratio: 4 ratio (ref 0.0–5.0)
Cholesterol, Total: 177 mg/dL (ref 100–199)
HDL: 44 mg/dL (ref >39)
LDL Chol Calc (NIH): 88 mg/dL (ref 0–99)
Triglycerides: 272 mg/dL — ABNORMAL HIGH (ref 0–149)
VLDL Cholesterol Cal: 45 mg/dL — ABNORMAL HIGH (ref 5–40)

## 2023-03-07 LAB — DEMENTIA PANEL
Homocysteine: 15 umol/L (ref 0.0–19.2)
RPR Ser Ql: NONREACTIVE
TSH: 1.72 u[IU]/mL (ref 0.450–4.500)
Vitamin B-12: 641 pg/mL (ref 232–1245)

## 2023-03-07 LAB — HEMOGLOBIN A1C
Est. average glucose Bld gHb Est-mCnc: 114 mg/dL
Hgb A1c MFr Bld: 5.6 % (ref 4.8–5.6)

## 2023-03-09 ENCOUNTER — Telehealth: Payer: Self-pay | Admitting: Neurology

## 2023-03-09 DIAGNOSIS — G43711 Chronic migraine without aura, intractable, with status migrainosus: Secondary | ICD-10-CM | POA: Insufficient documentation

## 2023-03-09 NOTE — Telephone Encounter (Signed)
    Please advise-per chart notes it looks like PT will be taking both but wanted to confirm.  "Trial of Depakote ER 500 mg daily for headache prophylaxis as well as Nurtec 75 mg daily for his headache. He may minimize using Imitrex to not more than once or twice a week".

## 2023-03-09 NOTE — Progress Notes (Signed)
Kindly inform the patient that memory panel labs are normal.  Screening test for diabetes is satisfactory.  Cholesterol profile shows slightly elevated bad cholesterol and triglycerides.  Kindly see primary care physician for advice on how to manage

## 2023-03-09 NOTE — Telephone Encounter (Signed)
MRI brain Ethlyn Gallery: [id number] exp. 03/09/23-04/08/23  CTA head/neck [employer] auth: [id number] exp. 03/09/23-04/08/23 sent to GI [phone]

## 2023-03-13 ENCOUNTER — Other Ambulatory Visit (HOSPITAL_COMMUNITY): Payer: Self-pay

## 2023-03-13 MED ORDER — NURTEC 75 MG PO TBDP: ORAL_TABLET | ORAL | 5 refills | Status: DC

## 2023-03-13 NOTE — Telephone Encounter (Signed)
The patient will be on depakote daily as a preventative and the nurtec should be abortive therapy not daily. I will correct the script. To reflect 8 tab for 30 days and for abortive/rescue therapy not daily

## 2023-03-13 NOTE — Addendum Note (Signed)
Addended by: Judi Cong on: 03/13/2023 08:11 AM   Modules accepted: Orders

## 2023-03-13 NOTE — Telephone Encounter (Signed)
   Since the PT is starting Nurtec-they are concerned that PT will be taking Nurtec and Sumatriptan together and per chart notes it looks like the provider is planning to keep the PT on both-Please advise

## 2023-03-14 NOTE — Telephone Encounter (Signed)
Pharmacy Patient Advocate Encounter   Received notification from [facility] that prior authorization for Nurtec 75MG  dispersible tablets is required/requested.   PA submitted to [facility] via CoverMyMeds [employer] or [facility]) confirmation # [id number] Status is pending

## 2023-03-15 ENCOUNTER — Ambulatory Visit: Admission: RE | Admit: 2023-03-15 | Payer: PRIVATE HEALTH INSURANCE | Source: Ambulatory Visit

## 2023-03-15 ENCOUNTER — Ambulatory Visit
Admission: RE | Admit: 2023-03-15 | Discharge: 2023-03-15 | Disposition: A | Discharge status: Home / Self Care | Payer: PRIVATE HEALTH INSURANCE | Source: Ambulatory Visit | Attending: Neurology | Admitting: Neurology

## 2023-03-15 MED ORDER — IOPAMIDOL (ISOVUE-370) INJECTION 76%
75.0000 mL | Freq: Once | INTRAVENOUS | Status: AC | PRN
  Administered 2023-03-15: 75 mL via INTRAVENOUS

## 2023-03-15 NOTE — Telephone Encounter (Signed)
Pharmacy Patient Advocate Encounter  Received notification from [facility] that the request for prior authorization for Nurtec 75MG  dispersible tablets has been denied due to see below.     Please be advised we currently do not have a Pharmacist to review denials, therefore you will need to process appeals accordingly as needed. Thanks for your support at this time.   You may call [phone] or fax [phone], to appeal.  The denial letter has been placed into the chart under the media tab.

## 2023-03-16 NOTE — Telephone Encounter (Signed)
     So it looks like this is the reason they denied-Per chart search PT ha snot tried any of these preferred alternatives-Please advise

## 2023-03-16 NOTE — Telephone Encounter (Signed)
Appeal was completed and sent for the patient to [facility] appeals and listed as expedited. Received confirmation that it went through

## 2023-03-20 ENCOUNTER — Telehealth: Payer: Self-pay | Admitting: Neurology

## 2023-03-20 NOTE — Telephone Encounter (Signed)
Pt wife called. Stated she is following up on CT results? She is requesting a call back from nurse to discuss

## 2023-03-21 IMAGING — DX DG CHEST 2V
2 series · 2 of 2 positions shown · non-contrast
Comparison: 08/10/2018.

CLINICAL DATA: Fever, chills, weakness and non productive cough for
several days.

EXAM:
CHEST - 2 VIEW

[chest pa]
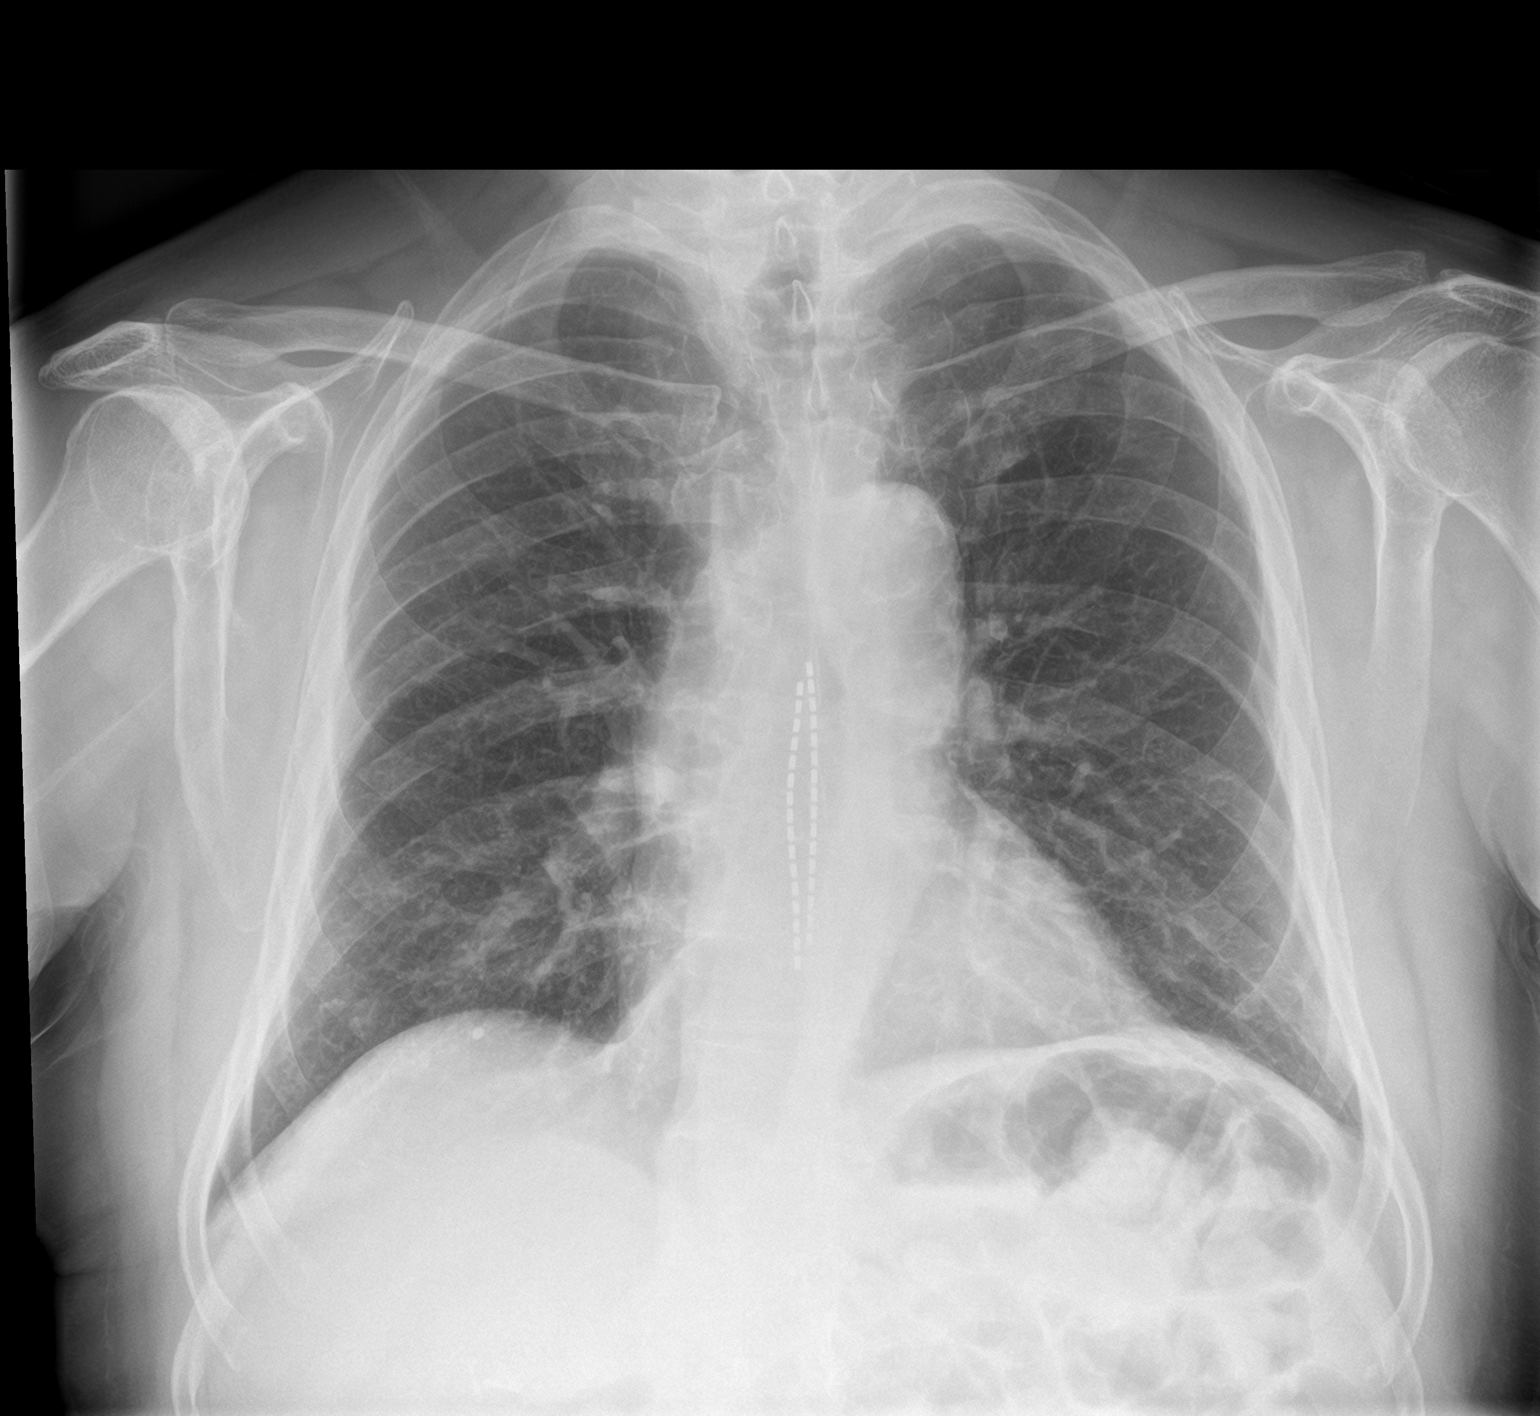

[chest lat]
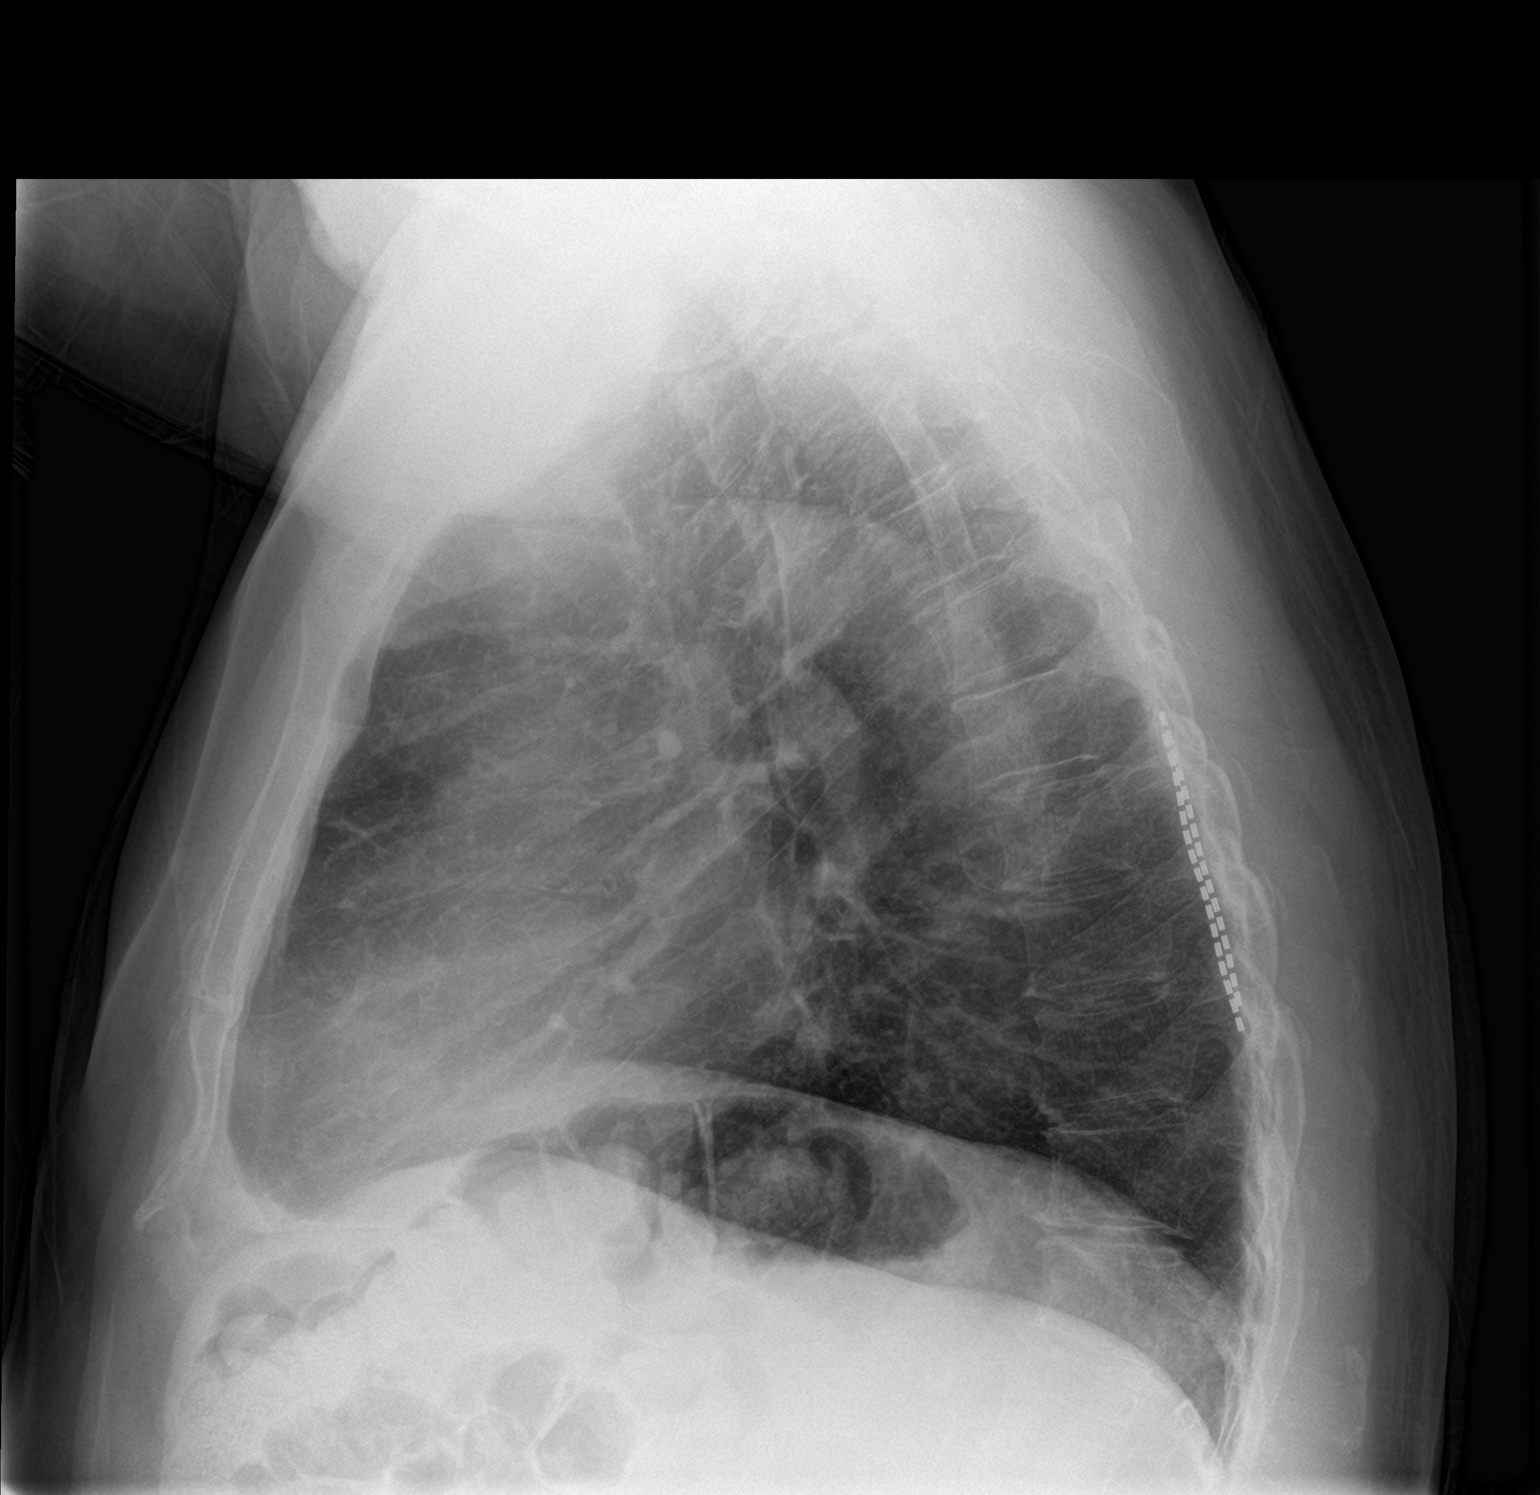

[2 of 2 positions shown; findings below may reference images not displayed]

FINDINGS: Cardiac silhouette normal in size. No mediastinal or hilar masses or
evidence of adenopathy.

Clear lungs.  No pleural effusion or pneumothorax.

Skeletal structures are intact. Spine stimulator leads project along
the posterior midthoracic spinal canal.
IMPRESSION: No active cardiopulmonary disease.

## 2023-03-22 ENCOUNTER — Other Ambulatory Visit (HOSPITAL_COMMUNITY): Payer: Self-pay

## 2023-03-22 ENCOUNTER — Encounter: Payer: Self-pay | Admitting: Neurology

## 2023-03-22 NOTE — Telephone Encounter (Signed)
I will reach out to the patient and make aware of patient assistance form that can be completed. This was found on [email or web address]. I have printed the form and will send to the patient to complete for pt assistance.

## 2023-03-22 NOTE — Telephone Encounter (Signed)
Pharmacy Patient Advocate Encounter  Prior Authorization for Nurtec ODT 75MG  Tablet has been APPROVED by [facility] from 09/26/2022 to 09/26/2023.   PA # Appeal Reference Number: [id number]  Copay is $234.85 for a 30DS-PT is in the coverage gap.

## 2023-03-23 NOTE — Telephone Encounter (Signed)
The notes from GI on the MRI order said that he has a coil device so he can't go to GI and has to go to the hospital. I sent the order over to [facility] to call him to schedule. Ethlyn Gallery: [id number] exp. 03/23/23-04/22/23 sent to [facility] [phone]

## 2023-03-24 NOTE — Progress Notes (Signed)
Kindly inform the patient that CT angiogram study of the blood vessels of the brain and the neck did not show any major blockages to worry about

## 2023-03-27 ENCOUNTER — Encounter: Payer: Self-pay | Admitting: Anesthesiology

## 2023-04-12 ENCOUNTER — Encounter (HOSPITAL_COMMUNITY): Payer: Self-pay

## 2023-04-12 ENCOUNTER — Ambulatory Visit (HOSPITAL_COMMUNITY): Admission: RE | Admit: 2023-04-12 | Payer: Medicare HMO | Source: Ambulatory Visit

## 2023-04-18 ENCOUNTER — Ambulatory Visit: Payer: Medicare HMO | Admitting: Neurology

## 2023-04-19 ENCOUNTER — Ambulatory Visit (HOSPITAL_COMMUNITY)
Admission: RE | Admit: 2023-04-19 | Discharge: 2023-04-19 | Disposition: A | Discharge status: Home / Self Care | Payer: Medicare HMO | Source: Ambulatory Visit | Attending: Neurology | Admitting: Neurology

## 2023-04-19 DIAGNOSIS — Z8673 Personal history of transient ischemic attack (TIA), and cerebral infarction without residual deficits: Secondary | ICD-10-CM | POA: Insufficient documentation

## 2023-04-19 MED ORDER — GADOBUTROL 1 MMOL/ML IV SOLN
7.0000 mL | Freq: Once | INTRAVENOUS | Status: AC | PRN
  Administered 2023-04-19: 7 mL via INTRAVENOUS

## 2023-04-23 ENCOUNTER — Emergency Department (HOSPITAL_COMMUNITY): Payer: Medicare HMO

## 2023-04-23 ENCOUNTER — Other Ambulatory Visit: Payer: Self-pay

## 2023-04-23 ENCOUNTER — Inpatient Hospital Stay (HOSPITAL_COMMUNITY)
Admission: EM | Admit: 2023-04-23 | Discharge: 2023-04-25 | DRG: 871 | Disposition: A | Discharge status: Home Health | Payer: Medicare HMO | Attending: Internal Medicine | Admitting: Internal Medicine

## 2023-04-23 ENCOUNTER — Encounter (HOSPITAL_COMMUNITY): Payer: Self-pay | Admitting: Internal Medicine

## 2023-04-23 DIAGNOSIS — E876 Hypokalemia: Secondary | ICD-10-CM | POA: Diagnosis present

## 2023-04-23 DIAGNOSIS — W57XXXA Bitten or stung by nonvenomous insect and other nonvenomous arthropods, initial encounter: Secondary | ICD-10-CM | POA: Diagnosis present

## 2023-04-23 DIAGNOSIS — F1120 Opioid dependence, uncomplicated: Secondary | ICD-10-CM | POA: Diagnosis present

## 2023-04-23 DIAGNOSIS — R6520 Severe sepsis without septic shock: Secondary | ICD-10-CM | POA: Diagnosis present

## 2023-04-23 DIAGNOSIS — E869 Volume depletion, unspecified: Secondary | ICD-10-CM | POA: Diagnosis present

## 2023-04-23 DIAGNOSIS — I10 Essential (primary) hypertension: Secondary | ICD-10-CM | POA: Diagnosis present

## 2023-04-23 DIAGNOSIS — R4189 Other symptoms and signs involving cognitive functions and awareness: Secondary | ICD-10-CM | POA: Diagnosis present

## 2023-04-23 DIAGNOSIS — J9811 Atelectasis: Secondary | ICD-10-CM | POA: Diagnosis present

## 2023-04-23 DIAGNOSIS — Z888 Allergy status to other drugs, medicaments and biological substances status: Secondary | ICD-10-CM

## 2023-04-23 DIAGNOSIS — Z79890 Hormone replacement therapy (postmenopausal): Secondary | ICD-10-CM

## 2023-04-23 DIAGNOSIS — Z8616 Personal history of COVID-19: Secondary | ICD-10-CM

## 2023-04-23 DIAGNOSIS — E039 Hypothyroidism, unspecified: Secondary | ICD-10-CM | POA: Diagnosis present

## 2023-04-23 DIAGNOSIS — G9341 Metabolic encephalopathy: Secondary | ICD-10-CM | POA: Diagnosis present

## 2023-04-23 DIAGNOSIS — Z85828 Personal history of other malignant neoplasm of skin: Secondary | ICD-10-CM | POA: Diagnosis not present

## 2023-04-23 DIAGNOSIS — G8929 Other chronic pain: Secondary | ICD-10-CM | POA: Diagnosis present

## 2023-04-23 DIAGNOSIS — Z7985 Long-term (current) use of injectable non-insulin antidiabetic drugs: Secondary | ICD-10-CM

## 2023-04-23 DIAGNOSIS — G4733 Obstructive sleep apnea (adult) (pediatric): Secondary | ICD-10-CM | POA: Diagnosis present

## 2023-04-23 DIAGNOSIS — Z96653 Presence of artificial knee joint, bilateral: Secondary | ICD-10-CM | POA: Diagnosis present

## 2023-04-23 DIAGNOSIS — Z9682 Presence of neurostimulator: Secondary | ICD-10-CM

## 2023-04-23 DIAGNOSIS — Z1152 Encounter for screening for COVID-19: Secondary | ICD-10-CM | POA: Diagnosis not present

## 2023-04-23 DIAGNOSIS — Z9079 Acquired absence of other genital organ(s): Secondary | ICD-10-CM

## 2023-04-23 DIAGNOSIS — J45909 Unspecified asthma, uncomplicated: Secondary | ICD-10-CM | POA: Diagnosis present

## 2023-04-23 DIAGNOSIS — E782 Mixed hyperlipidemia: Secondary | ICD-10-CM | POA: Diagnosis present

## 2023-04-23 DIAGNOSIS — Z9181 History of falling: Secondary | ICD-10-CM

## 2023-04-23 DIAGNOSIS — Z8673 Personal history of transient ischemic attack (TIA), and cerebral infarction without residual deficits: Secondary | ICD-10-CM

## 2023-04-23 DIAGNOSIS — M549 Dorsalgia, unspecified: Secondary | ICD-10-CM | POA: Diagnosis present

## 2023-04-23 DIAGNOSIS — A419 Sepsis, unspecified organism: Secondary | ICD-10-CM | POA: Diagnosis present

## 2023-04-23 DIAGNOSIS — R519 Headache, unspecified: Secondary | ICD-10-CM | POA: Diagnosis present

## 2023-04-23 DIAGNOSIS — E871 Hypo-osmolality and hyponatremia: Secondary | ICD-10-CM | POA: Diagnosis present

## 2023-04-23 DIAGNOSIS — R8281 Pyuria: Secondary | ICD-10-CM | POA: Diagnosis present

## 2023-04-23 DIAGNOSIS — Z79899 Other long term (current) drug therapy: Secondary | ICD-10-CM

## 2023-04-23 DIAGNOSIS — R531 Weakness: Secondary | ICD-10-CM | POA: Diagnosis present

## 2023-04-23 DIAGNOSIS — Z9049 Acquired absence of other specified parts of digestive tract: Secondary | ICD-10-CM

## 2023-04-23 DIAGNOSIS — Z85820 Personal history of malignant melanoma of skin: Secondary | ICD-10-CM

## 2023-04-23 DIAGNOSIS — Z86718 Personal history of other venous thrombosis and embolism: Secondary | ICD-10-CM

## 2023-04-23 DIAGNOSIS — D6959 Other secondary thrombocytopenia: Secondary | ICD-10-CM | POA: Diagnosis not present

## 2023-04-23 DIAGNOSIS — I2510 Atherosclerotic heart disease of native coronary artery without angina pectoris: Secondary | ICD-10-CM | POA: Diagnosis present

## 2023-04-23 DIAGNOSIS — Z91040 Latex allergy status: Secondary | ICD-10-CM

## 2023-04-23 DIAGNOSIS — Z8546 Personal history of malignant neoplasm of prostate: Secondary | ICD-10-CM | POA: Diagnosis not present

## 2023-04-23 DIAGNOSIS — E119 Type 2 diabetes mellitus without complications: Secondary | ICD-10-CM | POA: Diagnosis present

## 2023-04-23 DIAGNOSIS — Z881 Allergy status to other antibiotic agents status: Secondary | ICD-10-CM

## 2023-04-23 DIAGNOSIS — Z7982 Long term (current) use of aspirin: Secondary | ICD-10-CM

## 2023-04-23 DIAGNOSIS — E785 Hyperlipidemia, unspecified: Secondary | ICD-10-CM | POA: Diagnosis present

## 2023-04-23 LAB — APTT: aPTT: 30 seconds (ref 24–36)

## 2023-04-23 LAB — CBC
HCT: 40 % (ref 39.0–52.0)
Hemoglobin: 14.4 g/dL (ref 13.0–17.0)
MCH: 31.8 pg (ref 26.0–34.0)
MCHC: 36 g/dL (ref 30.0–36.0)
MCV: 88.3 fL (ref 80.0–100.0)
Platelets: 154 10*3/uL (ref 150–400)
RBC: 4.53 MIL/uL (ref 4.22–5.81)
RDW: 13.2 % (ref 11.5–15.5)
WBC: 7.4 10*3/uL (ref 4.0–10.5)
nRBC: 0 % (ref 0.0–0.2)

## 2023-04-23 LAB — URINALYSIS, W/ REFLEX TO CULTURE (INFECTION SUSPECTED)
Bilirubin Urine: NEGATIVE
Glucose, UA: NEGATIVE mg/dL
Ketones, ur: NEGATIVE mg/dL
Leukocytes,Ua: NEGATIVE
Nitrite: NEGATIVE
Protein, ur: >300 mg/dL — AB
Specific Gravity, Urine: 1.025 (ref 1.005–1.030)
pH: 6.5 (ref 5.0–8.0)

## 2023-04-23 LAB — COMPREHENSIVE METABOLIC PANEL
ALT: 25 U/L (ref 0–44)
AST: 37 U/L (ref 15–41)
Albumin: 3.5 g/dL (ref 3.5–5.0)
Alkaline Phosphatase: 81 U/L (ref 38–126)
Anion gap: 6 (ref 5–15)
BUN: 19 mg/dL (ref 8–23)
CO2: 28 mmol/L (ref 22–32)
Calcium: 8.2 mg/dL — ABNORMAL LOW (ref 8.9–10.3)
Chloride: 96 mmol/L — ABNORMAL LOW (ref 98–111)
Creatinine, Ser: 1.13 mg/dL (ref 0.61–1.24)
GFR, Estimated: >60 mL/min (ref >60)
Glucose, Bld: 139 mg/dL — ABNORMAL HIGH (ref 70–99)
Potassium: 3.2 mmol/L — ABNORMAL LOW (ref 3.5–5.1)
Sodium: 130 mmol/L — ABNORMAL LOW (ref 135–145)
Total Bilirubin: 1.8 mg/dL — ABNORMAL HIGH (ref 0.3–1.2)
Total Protein: 6.6 g/dL (ref 6.5–8.1)

## 2023-04-23 LAB — RESP PANEL BY RT-PCR (RSV, FLU A&B, COVID)  RVPGX2
Influenza A by PCR: NEGATIVE
Influenza B by PCR: NEGATIVE
Resp Syncytial Virus by PCR: NEGATIVE
SARS Coronavirus 2 by RT PCR: NEGATIVE

## 2023-04-23 LAB — RESPIRATORY PANEL BY PCR

## 2023-04-23 LAB — VITAMIN B12: Vitamin B-12: 318 pg/mL (ref 180–914)

## 2023-04-23 LAB — CULTURE, BLOOD (ROUTINE X 2)
Special Requests: ADEQUATE
Special Requests: ADEQUATE

## 2023-04-23 LAB — TROPONIN I (HIGH SENSITIVITY)
Troponin I (High Sensitivity): 14 ng/L (ref <18)
Troponin I (High Sensitivity): 16 ng/L (ref <18)

## 2023-04-23 LAB — CK: Total CK: 45 U/L — ABNORMAL LOW (ref 49–397)

## 2023-04-23 LAB — PROTIME-INR
INR: 1.1 (ref 0.8–1.2)
Prothrombin Time: 13.9 seconds (ref 11.4–15.2)

## 2023-04-23 LAB — CBG MONITORING, ED: Glucose-Capillary: 151 mg/dL — ABNORMAL HIGH (ref 70–99)

## 2023-04-23 LAB — LACTIC ACID, PLASMA: Lactic Acid, Venous: 1.7 mmol/L (ref 0.5–1.9)

## 2023-04-23 LAB — PROCALCITONIN: Procalcitonin: 0.36 ng/mL

## 2023-04-23 LAB — BRAIN NATRIURETIC PEPTIDE: B Natriuretic Peptide: 139 pg/mL — ABNORMAL HIGH (ref 0.0–100.0)

## 2023-04-23 LAB — FOLATE: Folate: 14.2 ng/mL (ref >5.9)

## 2023-04-23 MED ORDER — LACTATED RINGERS IV BOLUS (SEPSIS)
1000.0000 mL | Freq: Once | INTRAVENOUS | Status: AC
  Administered 2023-04-23: 1000 mL via INTRAVENOUS

## 2023-04-23 MED ORDER — VANCOMYCIN HCL 1250 MG/250ML IV SOLN
1250.0000 mg | INTRAVENOUS | Status: DC
  Administered 2023-04-24: 1250 mg via INTRAVENOUS
  Filled 2023-04-23: qty 250

## 2023-04-23 MED ORDER — OXYCODONE HCL ER 15 MG PO T12A
15.0000 mg | EXTENDED_RELEASE_TABLET | Freq: Two times a day (BID) | ORAL | Status: DC
  Administered 2023-04-23 – 2023-04-25 (×4): 15 mg via ORAL
  Filled 2023-04-23 (×4): qty 1

## 2023-04-23 MED ORDER — HEPARIN SODIUM (PORCINE) 5000 UNIT/ML IJ SOLN
5000.0000 [IU] | Freq: Three times a day (TID) | INTRAMUSCULAR | Status: DC
  Administered 2023-04-23 – 2023-04-25 (×6): 5000 [IU] via SUBCUTANEOUS
  Filled 2023-04-23 (×6): qty 1

## 2023-04-23 MED ORDER — LACTATED RINGERS IV BOLUS (SEPSIS): 250.0000 mL | Freq: Once | INTRAVENOUS | Status: DC

## 2023-04-23 MED ORDER — VANCOMYCIN HCL 1750 MG/350ML IV SOLN
1750.0000 mg | Freq: Once | INTRAVENOUS | Status: AC
  Administered 2023-04-23: 1750 mg via INTRAVENOUS
  Filled 2023-04-23: qty 350

## 2023-04-23 MED ORDER — ALBUTEROL SULFATE (2.5 MG/3ML) 0.083% IN NEBU: 3.0000 mL | INHALATION_SOLUTION | Freq: Four times a day (QID) | RESPIRATORY_TRACT | Status: DC | PRN

## 2023-04-23 MED ORDER — METOPROLOL SUCCINATE ER 50 MG PO TB24
50.0000 mg | ORAL_TABLET | Freq: Every evening | ORAL | Status: DC
  Administered 2023-04-23 – 2023-04-24 (×2): 50 mg via ORAL
  Filled 2023-04-23 (×3): qty 1

## 2023-04-23 MED ORDER — LACTATED RINGERS IV BOLUS (SEPSIS): 1000.0000 mL | Freq: Once | INTRAVENOUS | Status: DC

## 2023-04-23 MED ORDER — SODIUM CHLORIDE 0.9 % IV SOLN
2.0000 g | Freq: Once | INTRAVENOUS | Status: AC
  Administered 2023-04-23: 2 g via INTRAVENOUS
  Filled 2023-04-23: qty 12.5

## 2023-04-23 MED ORDER — LACTATED RINGERS IV SOLN: INTRAVENOUS | Status: DC

## 2023-04-23 MED ORDER — VANCOMYCIN HCL IN DEXTROSE 1-5 GM/200ML-% IV SOLN: 1000.0000 mg | Freq: Once | INTRAVENOUS | Status: DC

## 2023-04-23 MED ORDER — PANTOPRAZOLE SODIUM 40 MG PO TBEC
40.0000 mg | DELAYED_RELEASE_TABLET | Freq: Every day | ORAL | Status: DC
  Administered 2023-04-23 – 2023-04-25 (×3): 40 mg via ORAL
  Filled 2023-04-23 (×3): qty 1

## 2023-04-23 MED ORDER — LEVOTHYROXINE SODIUM 100 MCG PO TABS
100.0000 ug | ORAL_TABLET | Freq: Every day | ORAL | Status: DC
  Administered 2023-04-24 – 2023-04-25 (×2): 100 ug via ORAL
  Filled 2023-04-23 (×2): qty 1

## 2023-04-23 MED ORDER — ONDANSETRON HCL 4 MG/2ML IJ SOLN: 4.0000 mg | Freq: Four times a day (QID) | INTRAMUSCULAR | Status: DC | PRN

## 2023-04-23 MED ORDER — ACETAMINOPHEN 325 MG PO TABS
650.0000 mg | ORAL_TABLET | Freq: Four times a day (QID) | ORAL | Status: DC | PRN
  Administered 2023-04-23 – 2023-04-24 (×3): 650 mg via ORAL
  Filled 2023-04-23 (×4): qty 2

## 2023-04-23 MED ORDER — IOHEXOL 300 MG/ML  SOLN
75.0000 mL | Freq: Once | INTRAMUSCULAR | Status: AC | PRN
  Administered 2023-04-23: 75 mL via INTRAVENOUS

## 2023-04-23 MED ORDER — RANOLAZINE ER 500 MG PO TB12
500.0000 mg | ORAL_TABLET | Freq: Two times a day (BID) | ORAL | Status: DC
  Administered 2023-04-23 – 2023-04-25 (×4): 500 mg via ORAL
  Filled 2023-04-23 (×4): qty 1

## 2023-04-23 MED ORDER — ACETAMINOPHEN 650 MG RE SUPP: 650.0000 mg | Freq: Four times a day (QID) | RECTAL | Status: DC | PRN

## 2023-04-23 MED ORDER — SODIUM CHLORIDE 0.9 % IV SOLN
2.0000 g | Freq: Two times a day (BID) | INTRAVENOUS | Status: DC
  Administered 2023-04-24 (×3): 2 g via INTRAVENOUS
  Filled 2023-04-23 (×3): qty 12.5

## 2023-04-23 MED ORDER — ONDANSETRON HCL 4 MG PO TABS: 4.0000 mg | ORAL_TABLET | Freq: Four times a day (QID) | ORAL | Status: DC | PRN

## 2023-04-23 MED ORDER — ASPIRIN 81 MG PO TBEC
81.0000 mg | DELAYED_RELEASE_TABLET | Freq: Every evening | ORAL | Status: DC
  Administered 2023-04-23 – 2023-04-24 (×2): 81 mg via ORAL
  Filled 2023-04-23 (×2): qty 1

## 2023-04-23 MED ORDER — TRAZODONE HCL 50 MG PO TABS
75.0000 mg | ORAL_TABLET | Freq: Every day | ORAL | Status: DC
  Administered 2023-04-23 – 2023-04-24 (×2): 75 mg via ORAL
  Filled 2023-04-23 (×2): qty 2

## 2023-04-23 MED ORDER — OXYCODONE HCL 5 MG PO TABS
10.0000 mg | ORAL_TABLET | Freq: Four times a day (QID) | ORAL | Status: DC | PRN
  Administered 2023-04-24 – 2023-04-25 (×4): 10 mg via ORAL
  Filled 2023-04-23 (×4): qty 2

## 2023-04-23 MED ORDER — ACETAMINOPHEN 325 MG PO TABS
650.0000 mg | ORAL_TABLET | Freq: Once | ORAL | Status: AC
  Administered 2023-04-23: 650 mg via ORAL
  Filled 2023-04-23: qty 2

## 2023-04-23 MED ORDER — POTASSIUM CHLORIDE IN NACL 20-0.9 MEQ/L-% IV SOLN: INTRAVENOUS | Status: DC

## 2023-04-23 NOTE — Progress Notes (Signed)
Pharmacy Antibiotic Note  Joel Kim is a 77 y.o. male admitted on 04/23/2023 with  unknown source of infection .  Pharmacy has been consulted for vancomycin and cefepime dosing.  Plan: Vancomycin 1750 mg IV x 1 dose. Vancomycin 1250 mg IV every 24 hours. Cefepime 2000 mg IV every 12 hours. Monitor labs, c/s, and vanco levels as indicated  Height: 5\' 7"  (170.2 cm) Weight: 74.7 kg (164 lb 9.6 oz) IBW/kg (Calculated) : 66.1  Temp (24hrs), Avg:101.8 F (38.8 C), Min:101.8 F (38.8 C), Max:101.8 F (38.8 C)  Recent Labs  Lab 04/23/23 1050 04/23/23 1052 04/23/23 1136  WBC  --  7.4  --   CREATININE 1.13  --   --   LATICACIDVEN  --   --  1.7    Estimated Creatinine Clearance: 52 mL/min (by C-G formula based on SCr of 1.13 mg/dL).    Allergies  Allergen Reactions   Tapentadol Rash and Other (See Comments)    Breaks patient out   Nucynta    Paroxetine Hcl Other (See Comments)    Irritability    Statins Other (See Comments)    Muscle aches   Adhesive [Tape]     Breaks patient out    Ciprofloxacin Hives   Mannitol Other (See Comments)    Unknown   Reclast [Zoledronic Acid] Other (See Comments)    Ended up in hospital/ high fever and    Water For Injection [Water, Sterile]    Flagyl [Metronidazole] Rash   Latex Rash    Irritated skin    Antimicrobials this admission: Vanco 7/28 >> Cefepime 7/28 >>  Microbiology results: 7/28 BCx: pending   Thank you for allowing pharmacy to be a part of this patient's care.  Judeth Cornfield, PharmD Clinical Pharmacist 04/23/2023 12:13 PM

## 2023-04-23 NOTE — ED Notes (Signed)
Nurse called lab to get second blood cultures.

## 2023-04-23 NOTE — ED Provider Notes (Signed)
[place] EMERGENCY DEPARTMENT AT [facility] Provider Note   CSN: [id number] Arrival date & time: 04/23/23  1023     History  Chief Complaint  Patient presents with   Altered Mental Status    Joel Kim is a 77 y.o. male.  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  Patient is alert and oriented, patient's wife at the bedside provides majority of history.  She states that since the patient had COVID last fall he has had worsening headaches.  He has a longstanding history of migraines and is followed with neurology for same, however after COVID his headache began to worsen and become more frequent. He states he has been following with neurology for these headaches, and had an MRI for same on 7/24 that was normal. Patients wife states that he tripped and fell in the yard on 7/25, hit his head but did not loose consciousness. He was able to get up off the ground and walk around afterwards without issue.  States that the next day he noted he was feeling generally unwell and stayed in bed most of the day which is unusual for him.  Patient's wife states he has continued to decline since then and this morning was unable to even lift himself to get out of bed.  He normally walks without assistance.  EMS was called to the scene and noted the patient was febrile.  He had also urinated on himself and the urine had a foul odor, EMS suspected UTI. Patient denies any symptoms currently, specifically denies headache, neck pain or stiffness, chest pain, shortness of breath, nausea, vomiting, diarrhea, dysuria, or abdominal pain.  The history is provided by the patient. No language interpreter was used.  Altered Mental Status Associated symptoms: weakness        Home Medications Prior to Admission medications   Medication Sig Start Date End Date Taking? Authorizing  Provider  albuterol (PROVENTIL HFA;VENTOLIN HFA) 108 (90 Base) MCG/ACT inhaler Inhale 2 puffs into the lungs every 6 (six) hours as needed. Patient taking differently: Inhale 2 puffs into the lungs every 6 (six) hours as needed for wheezing or shortness of breath. 05/13/18   Rancour, Jeannett Senior, MD  Ascorbic Acid (VITAMIN C) 1000 MG tablet Take 1,000 mg by mouth daily.    [provider]  Aspirin 81 MG CAPS Take 1 capsule every day by oral route.    [provider]  Cholecalciferol (VITAMIN D3) 50 MCG (2000 UT) TABS Take 2,000 Units by mouth daily.    [provider]  divalproex (DEPAKOTE ER) 500 MG 24 hr tablet Take 1 tablet (500 mg total) by mouth daily. 03/06/23   Micki Riley, MD  hydrochlorothiazide (HYDRODIURIL) 25 MG tablet Take 25 mg by mouth daily.    [provider]  levothyroxine (SYNTHROID) 100 MCG tablet Take 100 mcg by mouth daily before breakfast. 12/06/18   [provider]  lisinopril (ZESTRIL) 5 MG tablet Take 5 mg by mouth daily.    [provider]  metoprolol succinate (TOPROL-XL) 50 MG 24 hr tablet Take 1 tablet by mouth daily. 12/19/22   [provider]  MOUNJARO 5 MG/0.5ML Pen Inject into the skin.    [provider]  Multiple Vitamins-Minerals (MULTIVITAMIN WITH MINERALS) tablet Take 1 tablet by mouth daily.    [provider]  mupirocin ointment (BACTROBAN) 2 % Apply to affected area 3 times daily 02/07/22  [provider]  oxyCODONE ER [facility] ER) 13.5 MG C12A take 1 capsule by oral route  every 12 hours (DNF 01/11/23) 01/05/23   [provider]  Oxycodone HCl 10 MG TABS Take 10 mg by mouth 4 (four) times daily as needed.    [provider]  pantoprazole (PROTONIX) 40 MG tablet Take 1 tablet (40 mg total) by mouth daily. 01/21/13   Ernestina Penna, MD  polyethylene glycol (MIRALAX / GLYCOLAX) 17 g packet Take 17 g by mouth daily as needed for mild constipation. 01/30/22    Rhetta Mura, MD  ranolazine (RANEXA) 500 MG 12 hr tablet Take 500 mg by mouth 2 (two) times daily.    [provider]  REPATHA SURECLICK 140 MG/ML SOAJ Inject into the skin.    [provider]  Rimegepant Sulfate (NURTEC) 75 MG TBDP Take 1 tab at onset of migraine. (Do not exceed more than 1 tab in 24 hrs) 03/13/23   Micki Riley, MD  sildenafil (REVATIO) 20 MG tablet  10/03/22   [provider]  SUMAtriptan (IMITREX) 50 MG tablet Take 1 tablet (50 mg total) by mouth as needed. Every 2 hrs as needed 06/17/19   Micki Riley, MD  traZODone (DESYREL) 50 MG tablet 75 mg.    [provider]  valACYclovir (VALTREX) 1000 MG tablet     [provider]      Allergies    Tapentadol; Paroxetine hcl; Statins; Adhesive [tape]; Ciprofloxacin; Mannitol; Reclast [zoledronic acid]; Water for injection [water, sterile]; Flagyl [metronidazole]; and Latex    Review of Systems   Review of Systems  Neurological:  Positive for weakness.  All other systems reviewed and are negative.   Physical Exam Updated Vital Signs BP (!) 167/85   Pulse 75   Temp (!) 101.8 F (38.8 C) (Axillary)   Resp (!) 25   Ht 5\' 7"  (1.702 m)   Wt 74.7 kg   SpO2 96%   BMI 25.78 kg/m  Physical Exam Vitals and nursing note reviewed.  Constitutional:      General: He is not in acute distress.    Appearance: Normal appearance. He is normal weight. He is not ill-appearing, toxic-appearing or diaphoretic.  HENT:     Head: Normocephalic and atraumatic.  Eyes:     Extraocular Movements: Extraocular movements intact.     Pupils: Pupils are equal, round, and reactive to light.  Neck:     Comments: No meningismus Cardiovascular:     Rate and Rhythm: Normal rate and regular rhythm.     Heart sounds: Normal heart sounds.  Pulmonary:     Effort: Pulmonary effort is normal. No respiratory distress.     Breath sounds: Rhonchi present.  Abdominal:     General: Abdomen is flat.      Palpations: Abdomen is soft.     Tenderness: There is no abdominal tenderness.  Musculoskeletal:        General: Normal range of motion.     Cervical back: Normal range of motion and neck supple.     Right lower leg: No edema.     Left lower leg: No edema.  Skin:    General: Skin is warm and dry.  Neurological:     General: No focal deficit present.     Mental Status: He is alert and oriented to person, place, and time. Mental status is at baseline.  Psychiatric:        Mood and Affect: Mood normal.  Behavior: Behavior normal.     ED Results / Procedures / Treatments   Labs (all labs ordered are listed, but only abnormal results are displayed) Labs Reviewed  COMPREHENSIVE METABOLIC PANEL - Abnormal; Notable for the following components:      Result Value   Sodium 130 (*)    Potassium 3.2 (*)    Chloride 96 (*)    Glucose, Bld 139 (*)    Calcium 8.2 (*)    Total Bilirubin 1.8 (*)    All other components within normal limits  CBG MONITORING, ED - Abnormal; Notable for the following components:   Glucose-Capillary 151 (*)    All other components within normal limits  SARS CORONAVIRUS 2 BY RT PCR  RESP PANEL BY RT-PCR (RSV, FLU A&B, COVID)  RVPGX2  CULTURE, BLOOD (ROUTINE X 2)  CULTURE, BLOOD (ROUTINE X 2)  CBC  LACTIC ACID, PLASMA  LACTIC ACID, PLASMA  PROTIME-INR  APTT  URINALYSIS, W/ REFLEX TO CULTURE (INFECTION SUSPECTED)  BRAIN NATRIURETIC PEPTIDE  TROPONIN I (HIGH SENSITIVITY)    EKG None  Radiology No results found.  Procedures .Critical Care  Performed by: Silva Bandy, PA-C Authorized by: Silva Bandy, PA-C   Critical care provider statement:    Critical care time (minutes):  30   Critical care was necessary to treat or prevent imminent or life-threatening deterioration of the following conditions:  Sepsis   Critical care was time spent personally by me on the following activities:  Development of treatment plan with patient or  surrogate, evaluation of patient's response to treatment, discussions with primary provider, examination of patient, obtaining history from patient or surrogate, interpretation of cardiac output measurements, ordering and review of laboratory studies, ordering and review of radiographic studies, pulse oximetry, re-evaluation of patient's condition and review of old charts   Care discussed with: admitting provider       Medications Ordered in ED Medications  lactated ringers infusion (has no administration in time range)  ceFEPIme (MAXIPIME) 2 g in sodium chloride 0.9 % 100 mL IVPB (2 g Intravenous New Bag/Given 04/23/23 1145)  acetaminophen (TYLENOL) tablet 650 mg (has no administration in time range)  lactated ringers bolus 1,000 mL (has no administration in time range)    And  lactated ringers bolus 1,000 mL (has no administration in time range)  vancomycin (VANCOREADY) IVPB 1750 mg/350 mL (has no administration in time range)    ED Course/ Medical Decision Making/ A&P                             Medical Decision Making Amount and/or Complexity of Data Reviewed Labs: ordered. Radiology: ordered. ECG/medicine tests: ordered.  Risk OTC drugs. Prescription drug management. Decision regarding hospitalization.   This patient is a 77 y.o. male who presents to the ED for concern of weakness, this involves an extensive number of treatment options, and is a complaint that carries with it a high risk of complications and morbidity. The emergent differential diagnosis prior to evaluation includes, but is not limited to,   sepsis, brain lesion, intracranial bleeding, CVA, spinal cord injury, ACS, arrhythmia, syncope, orthostatic hypotension,  hypoglycemia, hypoxia, electrolyte disturbance, endocrine disorder, anemia, environmental exposure, polypharmacy   This is not an exhaustive differential.   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 Pertinent results include: Followed by neurology for  headaches, had normal MRI on 7/24.  Physical Exam: Physical exam performed. The pertinent findings include: Mild unilateral weakness centimeters, alert and oriented and neurologically intact without focal deficits.  Lab Tests: I ordered, and personally interpreted labs.  The pertinent results include: No leukocytosis. Na 130, K 3.2. Lactic WNL. UA infectious, blood cultures and urine cultures pending   Imaging Studies: I ordered imaging studies including CXR, CT head, CT chest. I independently visualized and interpreted imaging which showed   CXR:  1. Borderline cardiomegaly without failure. 2. No acute cardiopulmonary disease.  CT head:  1. No acute intracranial findings. 2. Mild chronic microvascular ischemic change and cerebral volume loss.  CT chest: 1. No acute intrathoracic findings. 2. Mild dependent bibasilar subsegmental atelectasis. Lungs otherwise clear. 3. Enlarged main pulmonary artery, which can be seen in the setting of pulmonary arterial hypertension. 4. Aortic and coronary artery atherosclerosis 5. Hepatic steatosis with slightly nodular hepatic surface contour and recanalization of the umbilical vein, suggestive of cirrhosis. Correlate with liver function tests.   I agree with the radiologist interpretation.   Cardiac Monitoring:  The patient was maintained on a cardiac monitor.  My attending physician Dr. Estell Harpin viewed and interpreted the cardiac monitored which showed an underlying rhythm of: no STEMI. I agree with this interpretation.   Medications: I ordered medication including vancomycin, cefepime, and fluids for sepsis, Tylenol for fever. Reevaluation of the patient after these medicines showed that the patient improved. I have reviewed the patients home medicines and have made  adjustments as needed.   Disposition: After consideration of the diagnostic results and the patients response to treatment, I feel that patient will require admission for urosepsis and weakness. His chief complaint was noted as AMS, however patient is alert and oriented. He has no neck stiffness and currently denies any headaches. Low suspicion for CNS infection as etiology of symptoms.  I have discussed findings and plan with patient and his wife present at bedside who are understanding and amenable with plan.  Discussed patient with hospitalist who accepts patient for admission.   This is a shared visit with supervising physician Dr. Estell Harpin who has independently evaluated patient & provided guidance in evaluation/management/disposition, in agreement with care    Final Clinical Impression(s) / ED Diagnoses Final diagnoses:  Weakness  Sepsis, due to unspecified organism, unspecified whether acute organ dysfunction present [facility])    Rx / DC Orders ED Discharge Orders     None         Vear Clock 04/23/23 1448    Bethann Berkshire, MD 04/26/23 [phone]

## 2023-04-23 NOTE — ED Triage Notes (Signed)
Pt arrived [facility] from home. Pt had a CT with contrast done on Weds. Pt had a CT for recurrent headaches. After CT was completed pt began to become altered and his headaches were more severe. Pt fell on Thurs by tripping on something, no head injury from fall. Pt was normal on Friday was able to go in garden. Sat pt began more headaches, more altered. Pt has 18 left AC and 500cc NS give on truck. Pt has urinated on hisself and family thinks he has a UTI. Rhonchi noted to lungs by [facility].

## 2023-04-23 NOTE — H&P (Addendum)
History and Physical    Patient: Joel Kim ZOX:[id number] DOB: September 07, 1946 DOA: 04/23/2023 DOS: the patient was seen and examined on 04/23/2023 PCP: Eartha Inch, MD  Patient coming from: Home  Chief Complaint:  Chief Complaint  Patient presents with   Altered Mental Status   HPI: Joel Kim is a 77 year old male with a history of coronary artery disease, diabetes mellitus type 2, hypertension, hyperlipidemia, hypothyroidism, DVT status post anticoagulation, TIA, OSA, prostate cancer presenting with generalized weakness and altered mental status.  The patient and spouse at the bedside supplements the history.  The patient had a mechanical fall while walking that his dog on 04/20/2023.  He did not lose consciousness.  He states that he fell onto his buttocks.  The patient spouse noticed on the morning of 04/22/2023 that the patient had some generalized weakness.  Apparently he was somnolent and slept most of the day.  However when he woke up in the morning of 04/23/2023, the patient was somnolent and not talkative.  In addition he was not able to get out of bed on his own.  At baseline, the patient is able to ambulate without any assistance.  He is able to provide his own ADLs.  Due to his altered mental status and somnolence and weakness EMS was activated.  The patient denies any chest pain, nausea, vomiting, diarrhea, abdominal pain.  He has had nonproductive cough with some shortness of breath over the past 2 days.  He denies any hemoptysis, hematochezia, melena.  He denies any dysuria or hematuria Notably, the patient has been struggling with chronic headaches, primarily frontal for the last 8 months.  He has been following neurology.  MRI of the brain with and without contrast was ordered on 04/19/2023.  He was negative for any acute findings.  The patient's spouse states that the patient has had chronic daily headaches for the past 8 months.  Occasionally they have been worsened every 3  to 4 days.  The patient denies any visual disturbance or neck pain or focal extremity weakness.  [place]  They tend to some callus and donkeys.  there is no other exotic pets.  There is not been any recent travels.  However, the patient has been bit by ticks in the recent past.  In the ED, the patient was febrile up to 101.8 F.  He was tachypneic at 25.  WBC 7.4, hemoglobin 14.4, platelets 154,000.  Sodium 130, potassium 3.2, bicarbonate 28, serum creatinine 1.13.  Lactic acid 1.7.  CT of the chest showed enlarged pulmonary artery.  There is bibasilar atelectasis.  There is no consolidation.  There is a nodular hepatic surface.  UA showed 11-20 WBC.  Lactic acid 1.7.  Troponin 14>> 16.  EKG shows sinus rhythm with nonspecific T wave changes.  The patient was started on IV fluids, vancomycin, cefepime.  He was admitted for further evaluation and treatment.  Review of Systems: As mentioned in the history of present illness. All other systems reviewed and are negative. Past Medical History:  Diagnosis Date   Asthma    Atypical nevus 01/26/2006   right side of body (slight)   Basal cell carcinoma 11/17/2015   left ear rim tx cx3 [id number]   Cancer (HCC)    Hypertension    Hypothyroidism    Insomnia    Intractable migraine without aura    Melanoma (HCC) 10/10/2014   right ant. shoulder (exc)   Neuromuscular  disorder (HCC)    feet   Pancreatitis    Pre-diabetes    Prostate CA (HCC)    Spondylosis of lumbar spine    Stroke (HCC) 2021   TIA x 2 some memory loss.   Thyroid disease    Past Surgical History:  Procedure Laterality Date   BACK SURGERY  2013   L4-L5 and a redo in 2014 after a fall   CHOLECYSTECTOMY  1992   FOOT SURGERY Left 1999   PROSTATECTOMY  2008   ROTATOR CUFF REPAIR Left 2021   SPINAL CORD STIMULATOR IMPLANT  2020   TOTAL KNEE ARTHROPLASTY Right 2005   TOTAL KNEE ARTHROPLASTY Left 01/19/2022   Procedure: TOTAL KNEE ARTHROPLASTY;   Surgeon: Eugenia Mcalpine, MD;  Location: [facility];  Service: Orthopedics;  Laterality: Left;  adductor canal 120   Social History:  reports that he has never smoked. He has never used smokeless tobacco. He reports that he does not drink alcohol and does not use drugs.  Allergies  Allergen Reactions   Tapentadol Rash and Other (See Comments)    Breaks patient out   Nucynta    Paroxetine Hcl Other (See Comments)    Irritability    Statins Other (See Comments)    Muscle aches   Adhesive [Tape]     Breaks patient out    Ciprofloxacin Hives   Mannitol Other (See Comments)    Unknown   Reclast [Zoledronic Acid] Other (See Comments)    Ended up in hospital/ high fever and    Water For Injection [Water, Sterile]    Flagyl [Metronidazole] Rash   Latex Rash    Irritated skin    No family history on file.  Prior to Admission medications   Medication Sig Start Date End Date Taking? Authorizing Provider  albuterol (PROVENTIL HFA;VENTOLIN HFA) 108 (90 Base) MCG/ACT inhaler Inhale 2 puffs into the lungs every 6 (six) hours as needed. Patient taking differently: Inhale 2 puffs into the lungs every 6 (six) hours as needed for wheezing or shortness of breath. 05/13/18   Rancour, Jeannett Senior, MD  Ascorbic Acid (VITAMIN C) 1000 MG tablet Take 1,000 mg by mouth daily.    [provider]  Aspirin 81 MG CAPS Take 1 capsule every day by oral route.    [provider]  Cholecalciferol (VITAMIN D3) 50 MCG (2000 UT) TABS Take 2,000 Units by mouth daily.    [provider]  divalproex (DEPAKOTE ER) 500 MG 24 hr tablet Take 1 tablet (500 mg total) by mouth daily. 03/06/23   Micki Riley, MD  hydrochlorothiazide (HYDRODIURIL) 25 MG tablet Take 25 mg by mouth daily.    [provider]  levothyroxine (SYNTHROID) 100 MCG tablet Take 100 mcg by mouth daily before breakfast. 12/06/18   [provider]  lisinopril (ZESTRIL) 5 MG tablet Take 5 mg by mouth daily.    [provider]  metoprolol succinate (TOPROL-XL) 50 MG 24 hr tablet Take 1 tablet by mouth daily. 12/19/22   [provider]  MOUNJARO 5 MG/0.5ML Pen Inject into the skin.    [provider]  Multiple Vitamins-Minerals (MULTIVITAMIN WITH MINERALS) tablet Take 1 tablet by mouth daily.    [provider]  mupirocin ointment (BACTROBAN) 2 % Apply to affected area 3 times daily 02/07/22   [provider]  oxyCODONE ER [facility] ER) 13.5 MG C12A take 1 capsule by oral route  every 12 hours (DNF 01/11/23) 01/05/23   [provider]  Oxycodone HCl 10 MG TABS Take 10 mg by mouth 4 (four) times daily as needed.    [provider]  pantoprazole (PROTONIX) 40 MG tablet Take 1 tablet (40 mg total) by mouth daily. 01/21/13   Ernestina Penna, MD  polyethylene glycol (MIRALAX / GLYCOLAX) 17 g packet Take 17 g by mouth daily as needed for mild constipation. 01/30/22   Rhetta Mura, MD  ranolazine (RANEXA) 500 MG 12 hr tablet Take 500 mg by mouth 2 (two) times daily.    [provider]  REPATHA SURECLICK 140 MG/ML SOAJ Inject into the skin.    [provider]  Rimegepant Sulfate (NURTEC) 75 MG TBDP Take 1 tab at onset of migraine. (Do not exceed more than 1 tab in 24 hrs) 03/13/23   Micki Riley, MD  sildenafil (REVATIO) 20 MG tablet  10/03/22   [provider]  SUMAtriptan (IMITREX) 50 MG tablet Take 1 tablet (50 mg total) by mouth as needed. Every 2 hrs as needed 06/17/19   Micki Riley, MD  traZODone (DESYREL) 50 MG tablet 75 mg.    [provider]  valACYclovir (VALTREX) 1000 MG tablet     [provider]    Physical Exam: Vitals:   04/23/23 1049 04/23/23 1200 04/23/23 1300 04/23/23 1330  BP: (!) 167/85 (!) 164/87 (!) 146/75 (!) 149/85  Pulse: 75 73 75 74  Resp: (!) 25 14 19 20   Temp: (!) 101.8 F (38.8 C) (!) 101.7 F (38.7 C) (!) 100.8 F (38.2 C) (!) 100.4 F (38 C)  TempSrc: Axillary     SpO2:  96% 96% 96% 96%  Weight:      Height:       GENERAL:  A&O x 2, NAD, well developed, cooperative, follows commands HEENT: [place]/AT, No thrush, No icterus, No oral ulcers Neck:  No neck mass, No meningismus, soft, supple CV: RRR, no S3, no S4, no rub, no JVD Lungs: Bibasal rales but no wheezing.  Good air movement Abd: soft/NT +BS, nondistended Ext: No edema, no lymphangitis, no cyanosis, no rashes Neuro:  CN II-XII intact, strength 4/5 in RUE, RLE, strength 4/5 LUE, LLE; sensation intact bilateral; no dysmetria; babinski equivocal  Data Reviewed: Data reviewed above in the history  Assessment and Plan: Sepsis -Present on admission -Presented with fever and tachypnea -Suspect urinary source versus bacteremia -Check procalcitonin -Lactic acid peaked 1.7 -UA 11-20 WBC -Continue vancomycin and cefepime pending culture data -Chest x-ray negative for infiltrates -CT chest negative for consolidations  Pyuria -Continue vancomycin and cefepime pending culture data  Acute metabolic encephalopathy -Secondary to infectious process -Check tickborne serologies -UA 11-20 WBC -04/19/2023 MRI brain with and without contrast negative for any acute findings -wife notes that patient has some cognitive impairment at baseline and has declined cognitively in past 6-8 months  Impaired glucose tolerance -03/06/2023 hemoglobin A1c 5.6 -Patient is on Mounjaro  Essential hypertension -Continue metoprolol succinate -Holding lisinopril and hydrochlorothiazide  Chronic back pain/opioid dependence -PDMP reviewed -Patient receives Xtampza ER 13.5 mg, #60, last refill 04/12/2023 -Patient receives oxycodone 10 mg, #120, last refill 04/12/2023  Hypothyroidism -Continue levothyroxine  Mixed hyperlipidemia -Patient is on Repatha  Coronary artery disease -No chest pain presently -Continue metoprolol and Ranexa -Continue aspirin  Hyponatremia -due to volume depletion -start  IVNS  Hypokalemia -replete -check mag         Advance Care Planning: FULL  Consults: none  Family Communication: spouse updated 7/28  Severity of Illness: The appropriate patient  status for this patient is INPATIENT. Inpatient status is judged to be reasonable and necessary in order to provide the required intensity of service to ensure the patient's safety. The patient's presenting symptoms, physical exam findings, and initial radiographic and laboratory data in the context of their chronic comorbidities is felt to place them at high risk for further clinical deterioration. Furthermore, it is not anticipated that the patient will be medically stable for discharge from the hospital within 2 midnights of admission.   * I certify that at the point of admission it is my clinical judgment that the patient will require inpatient hospital care spanning beyond 2 midnights from the point of admission due to high intensity of service, high risk for further deterioration and high frequency of surveillance required.*  Author: Catarina Hartshorn, MD 04/23/2023 2:59 PM  For on call review [email or web address].

## 2023-04-23 NOTE — Hospital Course (Signed)
77 year old male with a history of coronary artery disease, diabetes mellitus type 2, hypertension, hyperlipidemia, hypothyroidism, DVT status post anticoagulation, TIA, OSA, prostate cancer presenting with generalized weakness and altered mental status.  The patient and spouse at the bedside supplements the history.  The patient had a mechanical fall while walking that his dog on 04/20/2023.  He did not lose consciousness.  He states that he fell onto his buttocks.  The patient spouse noticed on the morning of 04/22/2023 that the patient had some generalized weakness.  Apparently he was somnolent and slept most of the day.  However when he woke up in the morning of 04/23/2023, the patient was somnolent and not talkative.  In addition he was not able to get out of bed on his own.  At baseline, the patient is able to ambulate without any assistance.  He is able to provide his own ADLs.  Due to his altered mental status and somnolence and weakness EMS was activated.  The patient denies any chest pain, nausea, vomiting, diarrhea, abdominal pain.  He has had nonproductive cough with some shortness of breath over the past 2 days.  He denies any hemoptysis, hematochezia, melena.  He denies any dysuria or hematuria Notably, the patient has been struggling with chronic headaches, primarily frontal for the last 8 months.  He has been following neurology.  MRI of the brain with and without contrast was ordered on 04/19/2023.  He was negative for any acute findings.  The patient's spouse states that the patient has had chronic daily headaches for the past 8 months.  Occasionally they have been worsened every 3 to 4 days.  The patient denies any visual disturbance or neck pain or focal extremity weakness.  The patient [place]  They tend to some callus and donkeys.  there is no other exotic pets.  There is not been any recent travels.  However, the patient has been bit by ticks in the recent past.  In the  ED, the patient was febrile up to 101.8 F.  He was tachypneic at 25.  WBC 7.4, hemoglobin 14.4, platelets 154,000.  Sodium 130, potassium 3.2, bicarbonate 28, serum creatinine 1.13.  Lactic acid 1.7.  CT of the chest showed enlarged pulmonary artery.  There is bibasilar atelectasis.  There is no consolidation.  There is a nodular hepatic surface.  UA showed 11-20 WBC.  Lactic acid 1.7.  Troponin 14>> 16.  EKG shows sinus rhythm with nonspecific T wave changes.  The patient was started on IV fluids, vancomycin, cefepime.  He was admitted for further evaluation and treatment.

## 2023-04-23 NOTE — ED Notes (Signed)
ED TO INPATIENT HANDOFF REPORT  ED Nurse Name and Phone #: Elnita Maxwell Name/Age/Gender Joel Kim 77 y.o. male Room/Bed: APA14/APA14  Code Status   Code Status: Prior  Home/SNF/Other Home Patient oriented to: self, place, time, and situation Is this baseline? Yes   Triage Complete: Triage complete  Chief Complaint Sepsis due to undetermined organism [facility]) [A41.9]  Triage Note Pt arrived [facility] from home. Pt had a CT with contrast done on Weds. Pt had a CT for recurrent headaches. After CT was completed pt began to become altered and his headaches were more severe. Pt fell on Thurs by tripping on something, no head injury from fall. Pt was normal on Friday was able to go in garden. Sat pt began more headaches, more altered. Pt has 18 left AC and 500cc NS give on truck. Pt has urinated on hisself and family thinks he has a UTI. Rhonchi noted to lungs by [facility].    Allergies Allergies  Allergen Reactions   Tapentadol Rash and Other (See Comments)    Breaks patient out   Nucynta    Paroxetine Hcl Other (See Comments)    Irritability    Statins Other (See Comments)    Muscle aches   Adhesive [Tape]     Breaks patient out    Ciprofloxacin Hives   Mannitol Other (See Comments)    Unknown   Reclast [Zoledronic Acid] Other (See Comments)    Ended up in hospital/ high fever and    Water For Injection [Water, Sterile]    Flagyl [Metronidazole] Rash   Latex Rash    Irritated skin    Level of Care/Admitting Diagnosis ED Disposition     ED Disposition  Admit   Condition  --   Comment  Hospital Area: [facility] [[id number]]  Level of Care: Telemetry [5]  Covid Evaluation: Confirmed COVID Positive  Diagnosis: Sepsis due to undetermined organism [facility]) [[id number]]  Admitting Physician: TAT, DAVID [email or web address]-ming.Orion  Attending Physician: TAT, DAVID Shoi-ming.Orion  Certification:: I certify this patient will need inpatient services for at least 2 midnights  Estimated Length of Stay: 2           B Medical/Surgery History Past Medical History:  Diagnosis Date   Asthma    Atypical nevus 01/26/2006   right side of body (slight)   Basal cell carcinoma 11/17/2015   left ear rim tx cx3 63fu   Cancer (HCC)    Hypertension    Hypothyroidism    Insomnia    Intractable migraine without aura    Melanoma (HCC) 10/10/2014   right ant. shoulder (exc)   Neuromuscular disorder (HCC)    feet   Pancreatitis    Pre-diabetes    Prostate CA (HCC)    Spondylosis of lumbar spine    Stroke (HCC) 2021   TIA x 2 some memory loss.   Thyroid disease    Past Surgical History:  Procedure Laterality Date   BACK SURGERY  2013   L4-L5 and a redo in 2014 after a fall   CHOLECYSTECTOMY  1992   FOOT SURGERY Left 1999   PROSTATECTOMY  2008   ROTATOR CUFF REPAIR Left 2021   SPINAL CORD STIMULATOR IMPLANT  2020   TOTAL KNEE ARTHROPLASTY Right 2005   TOTAL KNEE ARTHROPLASTY Left 01/19/2022   Procedure: TOTAL KNEE ARTHROPLASTY;  Surgeon: Eugenia Mcalpine, MD;  Location: [facility];  Service: Orthopedics;  Laterality: Left;  adductor canal 120     A IV Location/Drains/Wounds Patient  Lines/Drains/Airways Status     Active Line/Drains/Airways     Name Placement date Placement time Site Days   Peripheral IV 04/23/23 18 G Left Antecubital 04/23/23  1055  Antecubital  less than 1   Peripheral IV 04/23/23 22 G Posterior;Right Wrist 04/23/23  1240  Wrist  less than 1   Urethral Catheter Serita Kyle W NT+3 Temperature probe 16 Fr. 04/23/23  1200  Temperature probe  less than 1            Intake/Output Last 24 hours  Intake/Output Summary (Last 24 hours) at 04/23/2023 1435 Last data filed at 04/23/2023 1304 Gross per 24 hour  Intake 100 ml  Output --  Net 100 ml    Labs/Imaging Results for orders placed or performed during the hospital encounter of 04/23/23 (from the past 48 hour(s))  Comprehensive metabolic panel     Status: Abnormal   Collection Time: 04/23/23 10:50 AM  Result Value  Ref Range   Sodium 130 (L) 135 - 145 mmol/L   Potassium 3.2 (L) 3.5 - 5.1 mmol/L   Chloride 96 (L) 98 - 111 mmol/L   CO2 28 22 - 32 mmol/L   Glucose, Bld 139 (H) 70 - 99 mg/dL    Comment: Glucose reference range applies only to samples taken after fasting for at least 8 hours.   BUN 19 8 - 23 mg/dL   Creatinine, Ser 8.29 0.61 - 1.24 mg/dL   Calcium 8.2 (L) 8.9 - 10.3 mg/dL   Total Protein 6.6 6.5 - 8.1 g/dL   Albumin 3.5 3.5 - 5.0 g/dL   AST 37 15 - 41 U/L   ALT 25 0 - 44 U/L   Alkaline Phosphatase 81 38 - 126 U/L   Total Bilirubin 1.8 (H) 0.3 - 1.2 mg/dL   GFR, Estimated >56 >21 mL/min    Comment: (NOTE) Calculated using the CKD-EPI Creatinine Equation (2021)    Anion gap 6 5 - 15    Comment: Performed at [facility], [address], [place], [place] [address]  CBC     Status: None   Collection Time: 04/23/23 10:52 AM  Result Value Ref Range   WBC 7.4 4.0 - 10.5 K/uL   RBC 4.53 4.22 - 5.81 MIL/uL   Hemoglobin 14.4 13.0 - 17.0 g/dL   HCT 78.4 69.6 - 29.5 %   MCV 88.3 80.0 - 100.0 fL   MCH 31.8 26.0 - 34.0 pg   MCHC 36.0 30.0 - 36.0 g/dL   RDW 28.4 13.2 - 44.0 %   Platelets 154 150 - 400 K/uL   nRBC 0.0 0.0 - 0.2 %    Comment: Performed at [facility], [address] [place], [place] [address]  CBG monitoring, ED     Status: Abnormal   Collection Time: 04/23/23 10:58 AM  Result Value Ref Range   Glucose-Capillary 151 (H) 70 - 99 mg/dL    Comment: Glucose reference range applies only to samples taken after fasting for at least 8 hours.  Lactic acid, plasma     Status: None   Collection Time: 04/23/23 11:36 AM  Result Value Ref Range   Lactic Acid, Venous 1.7 0.5 - 1.9 mmol/L    Comment: Performed at [facility], [address], [place], [place] [address]  Protime-INR     Status: None   Collection Time: 04/23/23 11:36 AM  Result Value Ref Range   Prothrombin Time 13.9 11.4 - 15.2 seconds   INR 1.1 0.8 - 1.2  Comment: (NOTE) INR goal varies based on device and  disease states. Performed at [facility], [address], [place], [place] [address]   APTT     Status: None   Collection Time: 04/23/23 11:36 AM  Result Value Ref Range   aPTT 30 24 - 36 seconds    Comment: Performed at [facility], [address], [place], [place] [address]  Troponin I (High Sensitivity)     Status: None   Collection Time: 04/23/23 11:36 AM  Result Value Ref Range   Troponin I (High Sensitivity) 14 <18 ng/L    Comment: (NOTE) Elevated high sensitivity troponin I (hsTnI) values and significant  changes across serial measurements may suggest ACS but many other  chronic and acute conditions are known to elevate hsTnI results.  Refer to the "Links" section for chest pain algorithms and additional  guidance. Performed at [facility], [address], [place], [place] [address]   Brain natriuretic peptide     Status: Abnormal   Collection Time: 04/23/23 11:46 AM  Result Value Ref Range   B Natriuretic Peptide 139.0 (H) 0.0 - 100.0 pg/mL    Comment: Performed at [facility], [address], [place], [place] [address]  Resp panel by RT-PCR (RSV, Flu A&B, Covid) Anterior Nasal Swab     Status: None   Collection Time: 04/23/23 12:00 PM   Specimen: Anterior Nasal Swab  Result Value Ref Range   SARS Coronavirus 2 by RT PCR NEGATIVE NEGATIVE    Comment: (NOTE) SARS-CoV-2 target nucleic acids are NOT DETECTED.  The SARS-CoV-2 RNA is generally detectable in upper respiratory specimens during the acute phase of infection. The lowest concentration of SARS-CoV-2 viral copies this assay can detect is 138 copies/mL. A negative result does not preclude SARS-Cov-2 infection and should not be used as the sole basis for treatment or other patient management decisions. A negative result may occur with  improper specimen collection/handling, submission of specimen other than nasopharyngeal swab, presence of viral mutation(s) within the areas targeted by this assay, and inadequate  number of viral copies(<138 copies/mL). A negative result must be combined with clinical observations, patient history, and epidemiological information. The expected result is Negative.  Fact Sheet for Patients:  BloggerCourse.com  Fact Sheet for Healthcare Providers:  SeriousBroker.it  This test is no t yet approved or cleared by the [place] FDA and  has been authorized for detection and/or diagnosis of SARS-CoV-2 by FDA under an Emergency Use Authorization (EUA). This EUA will remain  in effect (meaning this test can be used) for the duration of the COVID-19 declaration under Section 564(b)(1) of the Act, 21 U.S.C.section 360bbb-3(b)(1), unless the authorization is terminated  or revoked sooner.       Influenza A by PCR NEGATIVE NEGATIVE   Influenza B by PCR NEGATIVE NEGATIVE    Comment: (NOTE) The Xpert Xpress SARS-CoV-2/FLU/RSV plus assay is intended as an aid in the diagnosis of influenza from Nasopharyngeal swab specimens and should not be used as a sole basis for treatment. Nasal washings and aspirates are unacceptable for Xpert Xpress SARS-CoV-2/FLU/RSV testing.  Fact Sheet for Patients: BloggerCourse.com  Fact Sheet for Healthcare Providers: SeriousBroker.it  This test is not yet approved or cleared by the [place] FDA and has been authorized for detection and/or diagnosis of SARS-CoV-2 by FDA under an Emergency Use Authorization (EUA). This EUA will remain in effect (meaning this test can be used) for the duration of the COVID-19 declaration under Section 564(b)(1) of the  Act, 21 U.S.C. section 360bbb-3(b)(1), unless the authorization is terminated or revoked.     Resp Syncytial Virus by PCR NEGATIVE NEGATIVE    Comment: (NOTE) Fact Sheet for Patients: BloggerCourse.com  Fact Sheet for Healthcare  Providers: SeriousBroker.it  This test is not yet approved or cleared by the [place] FDA and has been authorized for detection and/or diagnosis of SARS-CoV-2 by FDA under an Emergency Use Authorization (EUA). This EUA will remain in effect (meaning this test can be used) for the duration of the COVID-19 declaration under Section 564(b)(1) of the Act, 21 U.S.C. section 360bbb-3(b)(1), unless the authorization is terminated or revoked.  Performed at [facility], [address], [place], [place] [address]   Urinalysis, w/ Reflex to Culture (Infection Suspected) -Urine, Clean Catch     Status: Abnormal   Collection Time: 04/23/23 12:00 PM  Result Value Ref Range   Specimen Source URINE, CLEAN CATCH    Color, Urine YELLOW YELLOW   APPearance CLEAR CLEAR   Specific Gravity, Urine 1.025 1.005 - 1.030   pH 6.5 5.0 - 8.0   Glucose, UA NEGATIVE NEGATIVE mg/dL   Hgb urine dipstick LARGE (A) NEGATIVE   Bilirubin Urine NEGATIVE NEGATIVE   Ketones, ur NEGATIVE NEGATIVE mg/dL   Protein, ur >604 (A) NEGATIVE mg/dL   Nitrite NEGATIVE NEGATIVE   Leukocytes,Ua NEGATIVE NEGATIVE   Squamous Epithelial / HPF 0-5 0 - 5 /HPF   WBC, UA 11-20 0 - 5 WBC/hpf    Comment: Reflex urine culture not performed if WBC <=10, OR if Squamous epithelial cells >5. If Squamous epithelial cells >5, suggest recollection.   RBC / HPF 6-10 0 - 5 RBC/hpf   Bacteria, UA FEW (A) NONE SEEN    Comment: Performed at [facility], [address] [place], [place] [address]  Troponin I (High Sensitivity)     Status: None   Collection Time: 04/23/23  1:15 PM  Result Value Ref Range   Troponin I (High Sensitivity) 16 <18 ng/L    Comment: (NOTE) Elevated high sensitivity troponin I (hsTnI) values and significant  changes across serial measurements may suggest ACS but many other  chronic and acute conditions are known to elevate hsTnI results.  Refer to the "Links" section for chest pain algorithms  and additional  guidance. Performed at [facility], [address], [place], [place] [address]    CT Chest W Contrast  Result Date: 04/23/2023 CLINICAL DATA:  Pneumonia, complication suspected, xray done EXAM: CT CHEST WITH CONTRAST TECHNIQUE: Multidetector CT imaging of the chest was performed during intravenous contrast administration. RADIATION DOSE REDUCTION: This exam was performed according to the departmental dose-optimization program which includes automated exposure control, adjustment of the mA and/or kV according to patient size and/or use of iterative reconstruction technique. CONTRAST:  75mL OMNIPAQUE IOHEXOL 300 MG/ML  SOLN COMPARISON:  05/07/2022 FINDINGS: Cardiovascular: Heart size is upper limits of normal. No pericardial effusion. Thoracic aorta is nonaneurysmal. Atherosclerotic calcifications of the aorta and coronary arteries. Main pulmonary artery measures 3.5 cm in diameter. Mediastinum/Nodes: No enlarged mediastinal, hilar, or axillary lymph nodes. Thyroid gland, trachea, and esophagus demonstrate no significant findings. Lungs/Pleura: Mild dependent bibasilar subsegmental atelectasis. Lungs are otherwise clear. No pleural effusion or pneumothorax. Upper Abdomen: Decreased attenuation of the hepatic parenchyma. Slightly nodular hepatic surface contour. Recanalization of the umbilical vein. No acute abnormality. Musculoskeletal: Midthoracic spinal stimulator in place. Degenerative changes of both shoulders. No acute bony or chest wall abnormality. IMPRESSION: 1. No acute intrathoracic findings. 2. Mild  dependent bibasilar subsegmental atelectasis. Lungs otherwise clear. 3. Enlarged main pulmonary artery, which can be seen in the setting of pulmonary arterial hypertension. 4. Aortic and coronary artery atherosclerosis (ICD10-I70.0). 5. Hepatic steatosis with slightly nodular hepatic surface contour and recanalization of the umbilical vein, suggestive of cirrhosis. Correlate with liver  function tests. Electronically Signed   By: Duanne Guess D.O.   On: 04/23/2023 13:13   CT Head Wo Contrast  Result Date: 04/23/2023 CLINICAL DATA:  Head trauma, minor (Age >= 65y) EXAM: CT HEAD WITHOUT CONTRAST TECHNIQUE: Contiguous axial images were obtained from the base of the skull through the vertex without intravenous contrast. RADIATION DOSE REDUCTION: This exam was performed according to the departmental dose-optimization program which includes automated exposure control, adjustment of the mA and/or kV according to patient size and/or use of iterative reconstruction technique. COMPARISON:  03/15/2023 FINDINGS: Brain: No evidence of acute infarction, hemorrhage, hydrocephalus, extra-axial collection or mass lesion/mass effect. Mild low-density changes within the periventricular and subcortical white matter most compatible with chronic microvascular ischemic change. Mild diffuse cerebral volume loss. Vascular: Atherosclerotic calcifications involving the large vessels of the skull base. No unexpected hyperdense vessel. Skull: Normal. Negative for fracture or focal lesion. Sinuses/Orbits: Hypoplastic right maxillary sinus. No acute findings. Other: Negative for scalp hematoma. IMPRESSION: 1. No acute intracranial findings. 2. Mild chronic microvascular ischemic change and cerebral volume loss. Electronically Signed   By: Duanne Guess D.O.   On: 04/23/2023 13:06   DG Chest Port 1 View  Result Date: 04/23/2023 CLINICAL DATA:  Altered mental status. EXAM: PORTABLE CHEST 1 VIEW COMPARISON:  One-view chest x-ray 05/07/2022 FINDINGS: The heart is upper limits of normal. Exaggerated by low lung volumes. Atherosclerotic calcifications are present at the aortic arch. No edema or effusion is present. No focal airspace disease is present. Spinal cord stimulator is stable in position. IMPRESSION: 1. Borderline cardiomegaly without failure. 2. No acute cardiopulmonary disease. Electronically Signed   By:  Marin Roberts M.D.   On: 04/23/2023 11:51    Pending Labs Unresulted Labs (From admission, onward)     Start     Ordered   04/24/23 0500  Creatinine, serum  Every Mon-Wed-Fri (0500),   R      04/23/23 1211   04/23/23 1103  Blood Culture (routine x 2)  (Septic presentation on arrival (screening labs, nursing and treatment orders for obvious sepsis))  BLOOD CULTURE X 2,   STAT      04/23/23 1107            Vitals/Pain Today's Vitals   04/23/23 1049 04/23/23 1200 04/23/23 1300 04/23/23 1330  BP: (!) 167/85 (!) 164/87 (!) 146/75 (!) 149/85  Pulse: 75 73 75 74  Resp: (!) 25 14 19 20   Temp: (!) 101.8 F (38.8 C) (!) 101.7 F (38.7 C) (!) 100.8 F (38.2 C) (!) 100.4 F (38 C)  TempSrc: Axillary     SpO2: 96% 96% 96% 96%  Weight:      Height:      PainSc:        Isolation Precautions No active isolations  Medications Medications  lactated ringers infusion ( Intravenous New Bag/Given 04/23/23 1356)  vancomycin (VANCOREADY) IVPB 1750 mg/350 mL (1,750 mg Intravenous New Bag/Given 04/23/23 1304)  ceFEPIme (MAXIPIME) 2 g in sodium chloride 0.9 % 100 mL IVPB (has no administration in time range)  vancomycin (VANCOREADY) IVPB 1250 mg/250 mL (has no administration in time range)  ceFEPIme (MAXIPIME) 2 g in sodium chloride 0.9 %  100 mL IVPB (0 g Intravenous Stopped 04/23/23 1304)  acetaminophen (TYLENOL) tablet 650 mg (650 mg Oral Given 04/23/23 1216)  lactated ringers bolus 1,000 mL (0 mLs Intravenous Stopped 04/23/23 1305)    And  lactated ringers bolus 1,000 mL (0 mLs Intravenous Stopped 04/23/23 1356)  iohexol (OMNIPAQUE) 300 MG/ML solution 75 mL (75 mLs Intravenous Contrast Given 04/23/23 1249)    Mobility walks with person assist     Focused Assessments See additional note   R Recommendations: See Admitting Provider Note  Report given to:   Additional Notes: Pt is alert and oriented x4, takes pill whole with no difficulties, pt has a temp foley that was placed in  ED, pt has not gotten up while in the ER but looks like he may be able to get up with 1 assist, denies pain, tylenol given due to temp, wife at bedside in the ER

## 2023-04-23 NOTE — Progress Notes (Signed)
Kindly inform the patient that MRI scan of the brain shows mild changes of hardening of the arteries.  No definite stroke or any acute abnormalities.  No significant change compared with previous MRI from 07/13/2019

## 2023-04-23 NOTE — Plan of Care (Signed)
  Problem: Education: Goal: Knowledge of General Education information will improve Description Including pain rating scale, medication(s)/side effects and non-pharmacologic comfort measures Outcome: Progressing   Problem: Health Behavior/Discharge Planning: Goal: Ability to manage health-related needs will improve Outcome: Progressing   

## 2023-04-23 NOTE — Progress Notes (Signed)
Elink following for sepsis protocol. 

## 2023-04-24 ENCOUNTER — Telehealth: Payer: Self-pay | Admitting: Anesthesiology

## 2023-04-24 DIAGNOSIS — I10 Essential (primary) hypertension: Secondary | ICD-10-CM | POA: Diagnosis not present

## 2023-04-24 DIAGNOSIS — G9341 Metabolic encephalopathy: Secondary | ICD-10-CM | POA: Diagnosis not present

## 2023-04-24 DIAGNOSIS — A419 Sepsis, unspecified organism: Secondary | ICD-10-CM | POA: Diagnosis not present

## 2023-04-24 MED ORDER — POTASSIUM CHLORIDE 10 MEQ/100ML IV SOLN
10.0000 meq | INTRAVENOUS | Status: AC
  Administered 2023-04-24 (×4): 10 meq via INTRAVENOUS
  Filled 2023-04-24 (×4): qty 100

## 2023-04-24 MED ORDER — CHLORHEXIDINE GLUCONATE CLOTH 2 % EX PADS
6.0000 | MEDICATED_PAD | Freq: Every day | CUTANEOUS | Status: DC
  Administered 2023-04-25: 6 via TOPICAL

## 2023-04-24 MED ORDER — POTASSIUM CHLORIDE IN NACL 20-0.9 MEQ/L-% IV SOLN: INTRAVENOUS | Status: AC

## 2023-04-24 MED ORDER — POTASSIUM CHLORIDE CRYS ER 20 MEQ PO TBCR
20.0000 meq | EXTENDED_RELEASE_TABLET | Freq: Once | ORAL | Status: AC
  Administered 2023-04-24: 20 meq via ORAL
  Filled 2023-04-24: qty 1

## 2023-04-24 MED ORDER — DOXYCYCLINE HYCLATE 100 MG PO TABS
100.0000 mg | ORAL_TABLET | Freq: Two times a day (BID) | ORAL | Status: DC
  Administered 2023-04-24 – 2023-04-25 (×3): 100 mg via ORAL
  Filled 2023-04-24 (×3): qty 1

## 2023-04-24 NOTE — TOC Progression Note (Signed)
Transition of Care [facility]) - Progression Note    Patient Details  Name: Joel Kim MRN: [id number] Date of Birth: 1946/03/10  Transition of [facility]) CM/SW Contact  Elliot Gault, LCSW Phone Number: 04/24/2023, 3:36 PM  Clinical Narrative:     TOC following. Pt agreeable to [facility] referral. CMS provider options reviewed. Referred as requested. Cory at [place] accepted. Anticipating dc tomorrow. Will follow.  Expected Discharge Plan: Home w Home Health Services Barriers to Discharge: Continued Medical Work up  Expected Discharge Plan and Services In-house Referral: Clinical Social Work     Living arrangements for the past 2 months: Single Family Home                           HH Arranged: PT HH Agency: [facility] Date [facility] Agency Contacted: 04/24/23   Representative spoke with at [facility] Agency: Kandee Keen   Social Determinants of Health (SDOH) Interventions SDOH Screenings   Food Insecurity: No Food Insecurity (04/23/2023)  Housing: Low Risk  (04/23/2023)  Transportation Needs: No Transportation Needs (04/23/2023)  Utilities: Not At Risk (04/23/2023)  Financial Resource Strain: Patient Declined (12/04/2022)   Received from [facility], [facility]  Physical Activity: Insufficiently Active (12/04/2022)   Received from [facility], [facility]  Social Connections: Moderately Integrated (12/04/2022)   Received from [facility], [facility]  Stress: No Stress Concern Present (12/21/2022)   Received from [facility], [facility]  Tobacco Use: Low Risk  (04/23/2023)    Readmission Risk Interventions    04/24/2023   12:51 PM  Readmission Risk Prevention Plan  Post Dischage Appt Not Complete  Medication Screening Complete  Transportation Screening Complete

## 2023-04-24 NOTE — Plan of Care (Signed)
  Problem: Acute Rehab PT Goals(only PT should resolve) Goal: Pt Will Go Supine/Side To Sit Outcome: Progressing Flowsheets (Taken 04/24/2023 1219) Pt will go Supine/Side to Sit:  with modified independence  with supervision Goal: Patient Will Transfer Sit To/From Stand Outcome: Progressing Flowsheets (Taken 04/24/2023 1219) Patient will transfer sit to/from stand:  with modified independence  with supervision Goal: Pt Will Transfer Bed To Chair/Chair To Bed Outcome: Progressing Flowsheets (Taken 04/24/2023 1219) Pt will Transfer Bed to Chair/Chair to Bed:  with modified independence  with supervision Goal: Pt Will Ambulate Outcome: Progressing Flowsheets (Taken 04/24/2023 1219) Pt will Ambulate:  100 feet  with supervision  with modified independence  with rolling walker   12:19 PM, 04/24/23 Ocie Bob, MPT Physical Therapist with [facility] [phone] office [phone] mobile phone

## 2023-04-24 NOTE — TOC Initial Note (Signed)
Transition of Care [facility]) - Initial/Assessment Note    Patient Details  Name: Joel Kim MRN: [id number] Date of Birth: 1946-01-05  Transition of [facility] CM/SW Contact:    Leitha Bleak, RN Phone Number: 04/24/2023, 12:51 PM  Clinical Narrative:      Patient admitted with sepsis. PT is recommending HHPT. Patient is agreeable. He asked that [facility] call his wife for preferences. CM spoke with his wife, she has no preferences. TOC following to set up . DC planning for 2 days.                Barriers to Discharge: Continued Medical Work up   Patient Goals and CMS Choice Patient states their goals for this hospitalization and ongoing recovery are:: to go home CMS Medicare.gov Compare Post Acute Care list provided to:: Patient Choice offered to / list presented to : Spouse      Expected Discharge Plan and Services       Living arrangements for the past 2 months: Single Family Home           Prior Living Arrangements/Services Living arrangements for the past 2 months: Single Family Home Lives with:: Spouse Patient language and need for interpreter reviewed:: Yes Do you feel safe going back to the place where you live?: Yes      Need for Family Participation in Patient Care: Yes (Comment) Care giver support system in place?: Yes (comment)   Criminal Activity/Legal Involvement Pertinent to Current Situation/Hospitalization: No - Comment as needed  Activities of Daily Living Home Assistive Devices/Equipment: Cane (specify quad or straight) ADL Screening (condition at time of admission) Patient's cognitive ability adequate to safely complete daily activities?: Yes Is the patient deaf or have difficulty hearing?: No Does the patient have difficulty seeing, even when wearing glasses/contacts?: No Does the patient have difficulty concentrating, remembering, or making decisions?: No Patient able to express need for assistance with ADLs?: Yes Does the patient have difficulty  dressing or bathing?: No Independently performs ADLs?: Yes (appropriate for developmental age) Does the patient have difficulty walking or climbing stairs?: No Weakness of Legs: None Weakness of Arms/Hands: None  Permission Sought/Granted                  Emotional Assessment     Affect (typically observed): Accepting Orientation: : Oriented to Self, Oriented to Place, Oriented to Situation, Oriented to  Time Alcohol / Substance Use: Not Applicable Psych Involvement: No (comment)  Admission diagnosis:  Weakness [R53.1] Sepsis due to undetermined organism (HCC) [A41.9] Sepsis, due to unspecified organism, unspecified whether acute organ dysfunction present [facility]) [A41.9] 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   Hypokalemia 01/25/2022   Acute anemia 01/25/2022   Acute metabolic encephalopathy 01/25/2022   Osteoarthritis of left knee 01/19/2022   Atypical chest pain 08/15/2018   Hyperlipidemia 08/15/2018   Family history of heart disease 08/15/2018   Hypertension 05/31/2018   Hypothyroidism 05/31/2018   Acute pancreatitis 05/31/2018   Mild renal insufficiency 05/31/2018   PCP:  Eartha Inch, MD Pharmacy:   [facility] [place], [place] - [address] [place] [address] Phone: [phone] Fax: [phone]     Social Determinants of Health (SDOH) Social History: SDOH Screenings   Food  Insecurity: No Food Insecurity (04/23/2023)  Housing: Low Risk  (04/23/2023)  Transportation Needs: No Transportation Needs (04/23/2023)  Utilities: Not At Risk (04/23/2023)  Financial Resource Strain: Patient Declined (12/04/2022)   Received [facility]  Physical Activity: Insufficiently Active  (12/04/2022)   Received from [facility], [facility]  Social Connections: Moderately Integrated (12/04/2022)   Received from [facility], [facility]  Stress: No Stress Concern Present (12/21/2022)   Received from [facility], [facility]  Tobacco Use: Low Risk  (04/23/2023)   SDOH Interventions:    Readmission Risk Interventions    04/24/2023   12:51 PM  Readmission Risk Prevention Plan  Post Dischage Appt Not Complete  Medication Screening Complete  Transportation Screening Complete

## 2023-04-24 NOTE — Evaluation (Signed)
Physical Therapy Evaluation Patient Details Name: Joel Kim MRN: [id number] DOB: [birth year] Today's Date: 04/24/2023  History of Present Illness  Joel Kim is a 77 year old male with a history of coronary artery disease, diabetes mellitus type 2, hypertension, hyperlipidemia, hypothyroidism, DVT status post anticoagulation, TIA, OSA, prostate cancer presenting with generalized weakness and altered mental status.  The patient and spouse at the bedside supplements the history.  The patient had a mechanical fall while walking that his dog on 04/20/2023.  He did not lose consciousness.  He states that he fell onto his buttocks.  The patient spouse noticed on the morning of 04/22/2023 that the patient had some generalized weakness.  Apparently he was somnolent and slept most of the day.  However when he woke up in the morning of 04/23/2023, the patient was somnolent and not talkative.  In addition he was not able to get out of bed on his own.  At baseline, the patient is able to ambulate without any assistance.  He is able to provide his own ADLs.  Due to his altered mental status and somnolence and weakness EMS was activated.  The patient denies any chest pain, nausea, vomiting, diarrhea, abdominal pain.  He has had nonproductive cough with some shortness of breath over the past 2 days.  He denies any hemoptysis, hematochezia, melena.  He denies any dysuria or hematuria  Notably, the patient has been struggling with chronic headaches, primarily frontal for the last 8 months.  He has been following neurology.  MRI of the brain with and without contrast was ordered on 04/19/2023.  He was negative for any acute findings.  The patient's spouse states that the patient has had chronic daily headaches for the past 8 months.  Occasionally they have been worsened every 3 to 4 days.  The patient denies any visual disturbance or neck pain or focal extremity weakness.     [place]  They  tend to some callus and donkeys.  there is no other exotic pets.  There is not been any recent travels.  However, the patient has been bit by ticks in the recent past.   Clinical Impression  Patient has difficulty maintaining standing balance without an AD, required use of RW for safety and demonstrated fair/good return for transferring to/from chair, commode, ambulating in hallway without loss of balance using RW and limited mostly due to fatigue.  Patient tolerated sitting up in chair with family members present after therapy.  Patient will benefit from continued skilled physical therapy in hospital and recommended venue below to increase strength, balance, endurance for safe ADLs and gait.        If plan is discharge home, recommend the following: A little help with walking and/or transfers;A little help with bathing/dressing/bathroom;Help with stairs or ramp for entrance;Assistance with cooking/housework   Can travel by private vehicle        Equipment Recommendations None recommended by PT  Recommendations for Other Services       Functional Status Assessment Patient has had a recent decline in their functional status and demonstrates the ability to make significant improvements in function in a reasonable and predictable amount of time.     Precautions / Restrictions Precautions Precautions: Fall Restrictions Weight Bearing Restrictions: No      Mobility  Bed Mobility Overal bed mobility: Needs Assistance Bed Mobility: Supine to Sit     Supine to sit: Min assist     General  bed mobility comments: increased time, labored movement    Transfers Overall transfer level: Needs assistance Equipment used: Rolling walker (2 wheels) Transfers: Sit to/from Stand, Bed to chair/wheelchair/BSC Sit to Stand: Min assist, Min guard   Step pivot transfers: Min assist, Min guard       General transfer comment: increased time with labored movement, fair/good return for  transferring to/from commode in bathroom    Ambulation/Gait Ambulation/Gait assistance: Min guard, Min assist Gait Distance (Feet): 65 Feet Assistive device: Rolling walker (2 wheels) Gait Pattern/deviations: Decreased step length - right, Decreased step length - left, Decreased stride length Gait velocity: decreased     General Gait Details: very unsteady on feet without AD, required the use of RW demonstrating slow slightly labored cadence without loss of balance  Stairs            Wheelchair Mobility     Tilt Bed    Modified Rankin (Stroke Patients Only)       Balance Overall balance assessment: Needs assistance Sitting-balance support: Feet supported, No upper extremity supported Sitting balance-Leahy Scale: Fair Sitting balance - Comments: fair/good seated at EOB   Standing balance support: During functional activity, No upper extremity supported Standing balance-Leahy Scale: Poor Standing balance comment: fair/good using RW                             Pertinent Vitals/Pain Pain Assessment Pain Assessment: No/denies pain    Home Living Family/patient expects to be discharged to:: Private residence Living Arrangements: Spouse/significant other Available Help at Discharge: Family;Available 24 hours/day Type of Home: House Home Access: Stairs to enter Entrance Stairs-Rails: Right Entrance Stairs-Number of Steps: 3-4   Home Layout: Multi-level;Able to live on main level with bedroom/bathroom Home Equipment: Rolling Walker (2 wheels)      Prior Function Prior Level of Function : Independent/Modified Independent;History of Falls (last six months)             Mobility Comments: Community ambulator using walking stick, household ambulator without AD ADLs Comments: Independent     Hand Dominance   Dominant Hand: Right    Extremity/Trunk Assessment   Upper Extremity Assessment Upper Extremity Assessment: Overall WFL for tasks  assessed    Lower Extremity Assessment Lower Extremity Assessment: Generalized weakness    Cervical / Trunk Assessment Cervical / Trunk Assessment: Kyphotic  Communication   Communication: No difficulties  Cognition Arousal/Alertness: Awake/alert Behavior During Therapy: WFL for tasks assessed/performed Overall Cognitive Status: Within Functional Limits for tasks assessed                                          General Comments      Exercises     Assessment/Plan    PT Assessment Patient needs continued PT services  PT Problem List Decreased strength;Decreased activity tolerance;Decreased balance;Decreased mobility       PT Treatment Interventions DME instruction;Gait training;Stair training;Functional mobility training;Therapeutic activities;Therapeutic exercise;Patient/family education;Balance training    PT Goals (Current goals can be found in the Care Plan section)  Acute Rehab PT Goals Patient Stated Goal: return home with family to assist PT Goal Formulation: With patient/family Time For Goal Achievement: 04/28/23 Potential to Achieve Goals: Good    Frequency Min 3X/week     Co-evaluation               AM-PAC  PT "6 Clicks" Mobility  Outcome Measure Help needed turning from your back to your side while in a flat bed without using bedrails?: A Little Help needed moving from lying on your back to sitting on the side of a flat bed without using bedrails?: A Little Help needed moving to and from a bed to a chair (including a wheelchair)?: A Little Help needed standing up from a chair using your arms (e.g., wheelchair or bedside chair)?: A Little Help needed to walk in hospital room?: A Little Help needed climbing 3-5 steps with a railing? : A Lot 6 Click Score: 17    End of Session   Activity Tolerance: Patient tolerated treatment well;Patient limited by fatigue Patient left: in chair;with call bell/phone within reach;with  family/visitor present Nurse Communication: Mobility status PT Visit Diagnosis: Unsteadiness on feet (R26.81);Other abnormalities of gait and mobility (R26.89);Muscle weakness (generalized) (M62.81)    Time: 8119-1478 PT Time Calculation (min) (ACUTE ONLY): 34 min   Charges:   PT Evaluation $PT Eval Moderate Complexity: 1 Mod PT Treatments $Therapeutic Activity: 23-37 mins PT General Charges $$ ACUTE PT VISIT: 1 Visit         12:17 PM, 04/24/23 Ocie Bob, MPT Physical Therapist with [facility] [phone] office [phone] mobile phone

## 2023-04-24 NOTE — Telephone Encounter (Signed)
-----   Message from Delia Heady sent at 04/23/2023  4:32 PM EDT ----- Joneen Roach inform the patient that MRI scan of the brain shows mild changes of hardening of the arteries.  No definite stroke or any acute abnormalities.  No significant change compared with previous MRI from 07/13/2019

## 2023-04-24 NOTE — Progress Notes (Addendum)
PROGRESS NOTE  Joel Kim ZOX:[id number] DOB: October 04, 1945 DOA: 04/23/2023 PCP: Eartha Inch, MD  Brief History:  77 year old male with a history of coronary artery disease, diabetes mellitus type 2, hypertension, hyperlipidemia, hypothyroidism, DVT status post anticoagulation, TIA, OSA, prostate cancer presenting with generalized weakness and altered mental status.  The patient and spouse at the bedside supplements the history.  The patient had a mechanical fall while walking that his dog on 04/20/2023.  He did not lose consciousness.  He states that he fell onto his buttocks.  The patient spouse noticed on the morning of 04/22/2023 that the patient had some generalized weakness.  Apparently he was somnolent and slept most of the day.  However when he woke up in the morning of 04/23/2023, the patient was somnolent and not talkative.  In addition he was not able to get out of bed on his own.  At baseline, the patient is able to ambulate without any assistance.  He is able to provide his own ADLs.  Due to his altered mental status and somnolence and weakness EMS was activated.  The patient denies any chest pain, nausea, vomiting, diarrhea, abdominal pain.  He has had nonproductive cough with some shortness of breath over the past 2 days.  He denies any hemoptysis, hematochezia, melena.  He denies any dysuria or hematuria Notably, the patient has been struggling with chronic headaches, primarily frontal for the last 8 months.  He has been following neurology.  MRI of the brain with and without contrast was ordered on 04/19/2023.  He was negative for any acute findings.  The patient's spouse states that the patient has had chronic daily headaches for the past 8 months.  Occasionally they have been worsened every 3 to 4 days.  The patient denies any visual disturbance or neck pain or focal extremity weakness.  [place]  They tend to some callus and donkeys.  there  is no other exotic pets.  There is not been any recent travels.  However, the patient has been bit by ticks in the recent past.  In the ED, the patient was febrile up to 101.8 F.  He was tachypneic at 25.  WBC 7.4, hemoglobin 14.4, platelets 154,000.  Sodium 130, potassium 3.2, bicarbonate 28, serum creatinine 1.13.  Lactic acid 1.7.  CT of the chest showed enlarged pulmonary artery.  There is bibasilar atelectasis.  There is no consolidation.  There is a nodular hepatic surface.  UA showed 11-20 WBC.  Lactic acid 1.7.  Troponin 14>> 16.  EKG shows sinus rhythm with nonspecific T wave changes.  The patient was started on IV fluids, vancomycin, cefepime.  He was admitted for further evaluation and treatment.   Assessment/Plan: Sepsis -Present on admission -Presented with fever and tachypnea -Suspect urinary source versus bacteremia -Check procalcitonin 0.36 -Lactic acid peaked 1.7 -UA 11-20 WBC -Continue vancomycin and cefepime pending culture data -Chest x-ray negative for infiltrates -CT chest negative for consolidations -COVID-19 PCR negative -Viral respiratory panel negative -Patient still having fevers but trending down. -Added empiric doxycycline to cover for tickborne illness   Pyuria -Continue vancomycin and cefepime pending culture data   Acute metabolic encephalopathy -Secondary to infectious process -Check tickborne serologies -UA 11-20 WBC -04/19/2023 MRI brain with and without contrast negative for any acute findings -wife notes that patient has some cognitive impairment at baseline and has declined cognitively in past 6-8 months  Impaired glucose tolerance -03/06/2023 hemoglobin A1c 5.6 -Patient is on Mounjaro   Essential hypertension -Continue metoprolol succinate -Holding lisinopril and hydrochlorothiazide   Chronic back pain/opioid dependence -PDMP reviewed -Patient receives Xtampza ER 13.5 mg, #60, last refill 04/12/2023 -Patient receives oxycodone 10 mg,  #120, last refill 04/12/2023   Hypothyroidism -Continue levothyroxine   Mixed hyperlipidemia -Patient is on Repatha   Coronary artery disease -No chest pain presently -Continue metoprolol and Ranexa -Continue aspirin   Hyponatremia -due to volume depletion -Continue IVNS   Hypokalemia -replete -check mag--1.9           Family Communication:   Spouse updated at bedside 04/24/2023  Consultants:  none  Code Status:  FULL  DVT Prophylaxis:  [place] Heparin    Procedures: As Listed in Progress Note Above  Antibiotics: Vancomycin 7/28>> Cefepime 7/28>>     Subjective: Patient denies fevers, chills, headache, chest pain, dyspnea, nausea, vomiting, diarrhea, abdominal pain, dysuria, hematuria, hematochezia, and melena.   Objective: Vitals:   04/23/23 2256 04/24/23 0200 04/24/23 0546 04/24/23 1007  BP: 131/61  (!) 155/72   Pulse: 61  73   Resp: 17  18   Temp: (!) 101.5 F (38.6 C) 100.3 F (37.9 C) (!) 100.7 F (38.2 C) 100.1 F (37.8 C)  TempSrc: Oral  Oral   SpO2: 98%  99%   Weight:      Height:        Intake/Output Summary (Last 24 hours) at 04/24/2023 1225 Last data filed at 04/24/2023 0500 Gross per 24 hour  Intake 3485.82 ml  Output 2300 ml  Net 1185.82 ml   Weight change:  Exam:  General:  Pt is alert, follows commands appropriately, not in acute distress HEENT: No icterus, No thrush, No neck mass, [place]/AT; no meningismus Cardiovascular: RRR, S1/S2, no rubs, no gallops Respiratory: CTA bilaterally, no wheezing, no crackles, no rhonchi Abdomen: Soft/+BS, non tender, non distended, no guarding Extremities: Trace lower extremity edema, No lymphangitis, No petechiae, No rashes, no synovitis   Data Reviewed: I have personally reviewed following labs and imaging studies Basic Metabolic Panel: Recent Labs  Lab 04/23/23 1050 04/24/23 0453  NA 130* 137  K 3.2* 3.1*  CL 96* 101  CO2 28 27  GLUCOSE 139* 99  BUN 19 17  CREATININE 1.13 1.01   CALCIUM 8.2* 8.5*  MG  --  1.9   Liver Function Tests: Recent Labs  Lab 04/23/23 1050 04/24/23 0453  AST 37 54*  ALT 25 29  ALKPHOS 81 84  BILITOT 1.8* 1.9*  PROT 6.6 6.7  ALBUMIN 3.5 3.5   No results for input(s): "LIPASE", "AMYLASE" in the last 168 hours. No results for input(s): "AMMONIA" in the last 168 hours. Coagulation Profile: Recent Labs  Lab 04/23/23 1136  INR 1.1   CBC: Recent Labs  Lab 04/23/23 1052 04/24/23 0453  WBC 7.4 7.2  HGB 14.4 15.1  HCT 40.0 43.3  MCV 88.3 91.2  PLT 154 129*   Cardiac Enzymes: Recent Labs  Lab 04/23/23 1533  CKTOTAL 45*   BNP: Invalid input(s): "POCBNP" CBG: Recent Labs  Lab 04/23/23 1058  GLUCAP 151*   HbA1C: No results for input(s): "HGBA1C" in the last 72 hours. Urine analysis:    Component Value Date/Time   COLORURINE YELLOW 04/23/2023 1200   APPEARANCEUR CLEAR 04/23/2023 1200   LABSPEC 1.025 04/23/2023 1200   PHURINE 6.5 04/23/2023 1200   GLUCOSEU NEGATIVE 04/23/2023 1200   HGBUR LARGE (A) 04/23/2023 1200   BILIRUBINUR NEGATIVE 04/23/2023  1200   KETONESUR NEGATIVE 04/23/2023 1200   PROTEINUR >300 (A) 04/23/2023 1200   UROBILINOGEN 0.2 11/06/2010 1951   NITRITE NEGATIVE 04/23/2023 1200   LEUKOCYTESUR NEGATIVE 04/23/2023 1200   Sepsis Labs: @LABRCNTIP (procalcitonin:4,lacticidven:4) ) Recent Results (from the past 240 hour(s))  Blood Culture (routine x 2)     Status: None (Preliminary result)   Collection Time: 04/23/23 11:36 AM   Specimen: Right Antecubital; Blood  Result Value Ref Range Status   Specimen Description   Final    RIGHT ANTECUBITAL BOTTLES DRAWN AEROBIC AND ANAEROBIC   Special Requests Blood Culture adequate volume  Final   Culture   Final    NO GROWTH < 24 HOURS Performed at [facility], [address] [place], [place] [address]    Report Status PENDING  Incomplete  Resp panel by RT-PCR (RSV, Flu A&B, Covid) Anterior Nasal Swab     Status: None   Collection Time: 04/23/23  12:00 PM   Specimen: Anterior Nasal Swab  Result Value Ref Range Status   SARS Coronavirus 2 by RT PCR NEGATIVE NEGATIVE Final    Comment: (NOTE) SARS-CoV-2 target nucleic acids are NOT DETECTED.  The SARS-CoV-2 RNA is generally detectable in upper respiratory specimens during the acute phase of infection. The lowest concentration of SARS-CoV-2 viral copies this assay can detect is 138 copies/mL. A negative result does not preclude SARS-Cov-2 infection and should not be used as the sole basis for treatment or other patient management decisions. A negative result may occur with  improper specimen collection/handling, submission of specimen other than nasopharyngeal swab, presence of viral mutation(s) within the areas targeted by this assay, and inadequate number of viral copies(<138 copies/mL). A negative result must be combined with clinical observations, patient history, and epidemiological information. The expected result is Negative.  Fact Sheet for Patients:  BloggerCourse.com  Fact Sheet for Healthcare Providers:  SeriousBroker.it  This test is no t yet approved or cleared by the [place] FDA and  has been authorized for detection and/or diagnosis of SARS-CoV-2 by FDA under an Emergency Use Authorization (EUA). This EUA will remain  in effect (meaning this test can be used) for the duration of the COVID-19 declaration under Section 564(b)(1) of the Act, 21 U.S.C.section 360bbb-3(b)(1), unless the authorization is terminated  or revoked sooner.       Influenza A by PCR NEGATIVE NEGATIVE Final   Influenza B by PCR NEGATIVE NEGATIVE Final    Comment: (NOTE) The Xpert Xpress SARS-CoV-2/FLU/RSV plus assay is intended as an aid in the diagnosis of influenza from Nasopharyngeal swab specimens and should not be used as a sole basis for treatment. Nasal washings and aspirates are unacceptable for Xpert Xpress  SARS-CoV-2/FLU/RSV testing.  Fact Sheet for Patients: BloggerCourse.com  Fact Sheet for Healthcare Providers: SeriousBroker.it  This test is not yet approved or cleared by the [place] FDA and has been authorized for detection and/or diagnosis of SARS-CoV-2 by FDA under an Emergency Use Authorization (EUA). This EUA will remain in effect (meaning this test can be used) for the duration of the COVID-19 declaration under Section 564(b)(1) of the Act, 21 U.S.C. section 360bbb-3(b)(1), unless the authorization is terminated or revoked.     Resp Syncytial Virus by PCR NEGATIVE NEGATIVE Final    Comment: (NOTE) Fact Sheet for Patients: BloggerCourse.com  Fact Sheet for Healthcare Providers: SeriousBroker.it  This test is not yet approved or cleared by the [place] FDA and has been authorized for detection and/or diagnosis of SARS-CoV-2 by  FDA under an Emergency Use Authorization (EUA). This EUA will remain in effect (meaning this test can be used) for the duration of the COVID-19 declaration under Section 564(b)(1) of the Act, 21 U.S.C. section 360bbb-3(b)(1), unless the authorization is terminated or revoked.  Performed at [facility], [address] [place], [place] [address]   Blood Culture (routine x 2)     Status: None (Preliminary result)   Collection Time: 04/23/23  1:15 PM   Specimen: Right Antecubital; Blood  Result Value Ref Range Status   Specimen Description   Final    RIGHT ANTECUBITAL BOTTLES DRAWN AEROBIC AND ANAEROBIC   Special Requests Blood Culture adequate volume  Final   Culture   Final    NO GROWTH < 24 HOURS Performed at [facility], [address] [place], [place] [address]    Report Status PENDING  Incomplete  Respiratory (~20 pathogens) panel by PCR     Status: None   Collection Time: 04/23/23  3:57 PM   Specimen: Nasopharyngeal Swab;  Respiratory  Result Value Ref Range Status   Adenovirus NOT DETECTED NOT DETECTED Final   Coronavirus 229E NOT DETECTED NOT DETECTED Final    Comment: (NOTE) The Coronavirus on the Respiratory Panel, DOES NOT test for the novel  Coronavirus (2019 nCoV)    Coronavirus HKU1 NOT DETECTED NOT DETECTED Final   Coronavirus NL63 NOT DETECTED NOT DETECTED Final   Coronavirus OC43 NOT DETECTED NOT DETECTED Final   Metapneumovirus NOT DETECTED NOT DETECTED Final   Rhinovirus / Enterovirus NOT DETECTED NOT DETECTED Final   Influenza A NOT DETECTED NOT DETECTED Final   Influenza B NOT DETECTED NOT DETECTED Final   Parainfluenza Virus 1 NOT DETECTED NOT DETECTED Final   Parainfluenza Virus 2 NOT DETECTED NOT DETECTED Final   Parainfluenza Virus 3 NOT DETECTED NOT DETECTED Final   Parainfluenza Virus 4 NOT DETECTED NOT DETECTED Final   Respiratory Syncytial Virus NOT DETECTED NOT DETECTED Final   Bordetella pertussis NOT DETECTED NOT DETECTED Final   Bordetella Parapertussis NOT DETECTED NOT DETECTED Final   Chlamydophila pneumoniae NOT DETECTED NOT DETECTED Final   Mycoplasma pneumoniae NOT DETECTED NOT DETECTED Final    Comment: Performed at [facility], [address] [place], [place] [address]     Scheduled Meds:  aspirin EC  81 mg Oral QPM   Chlorhexidine Gluconate Cloth  6 each Topical Q0600   heparin  5,000 Units Subcutaneous Q8H   levothyroxine  100 mcg Oral QAC breakfast   metoprolol succinate  50 mg Oral QPM   oxyCODONE  15 mg Oral Q12H   pantoprazole  40 mg Oral Daily   ranolazine  500 mg Oral BID   traZODone  75 mg Oral QHS   Continuous Infusions:  0.9 % NaCl with KCl 20 mEq / L 100 mL/hr at 04/23/23 2146   ceFEPime (MAXIPIME) IV 2 g (04/24/23 0019)   vancomycin      Procedures/Studies: CT Chest W Contrast  Result Date: 04/23/2023 CLINICAL DATA:  Pneumonia, complication suspected, xray done EXAM: CT CHEST WITH CONTRAST TECHNIQUE: Multidetector CT imaging of the  chest was performed during intravenous contrast administration. RADIATION DOSE REDUCTION: This exam was performed according to the departmental dose-optimization program which includes automated exposure control, adjustment of the mA and/or kV according to patient size and/or use of iterative reconstruction technique. CONTRAST:  75mL OMNIPAQUE IOHEXOL 300 MG/ML  SOLN COMPARISON:  05/07/2022 FINDINGS: Cardiovascular: Heart size is upper limits of normal. No pericardial  effusion. Thoracic aorta is nonaneurysmal. Atherosclerotic calcifications of the aorta and coronary arteries. Main pulmonary artery measures 3.5 cm in diameter. Mediastinum/Nodes: No enlarged mediastinal, hilar, or axillary lymph nodes. Thyroid gland, trachea, and esophagus demonstrate no significant findings. Lungs/Pleura: Mild dependent bibasilar subsegmental atelectasis. Lungs are otherwise clear. No pleural effusion or pneumothorax. Upper Abdomen: Decreased attenuation of the hepatic parenchyma. Slightly nodular hepatic surface contour. Recanalization of the umbilical vein. No acute abnormality. Musculoskeletal: Midthoracic spinal stimulator in place. Degenerative changes of both shoulders. No acute bony or chest wall abnormality. IMPRESSION: 1. No acute intrathoracic findings. 2. Mild dependent bibasilar subsegmental atelectasis. Lungs otherwise clear. 3. Enlarged main pulmonary artery, which can be seen in the setting of pulmonary arterial hypertension. 4. Aortic and coronary artery atherosclerosis (ICD10-I70.0). 5. Hepatic steatosis with slightly nodular hepatic surface contour and recanalization of the umbilical vein, suggestive of cirrhosis. Correlate with liver function tests. Electronically Signed   By: Duanne Guess D.O.   On: 04/23/2023 13:13   CT Head Wo Contrast  Result Date: 04/23/2023 CLINICAL DATA:  Head trauma, minor (Age >= 65y) EXAM: CT HEAD WITHOUT CONTRAST TECHNIQUE: Contiguous axial images were obtained from the base of  the skull through the vertex without intravenous contrast. RADIATION DOSE REDUCTION: This exam was performed according to the departmental dose-optimization program which includes automated exposure control, adjustment of the mA and/or kV according to patient size and/or use of iterative reconstruction technique. COMPARISON:  03/15/2023 FINDINGS: Brain: No evidence of acute infarction, hemorrhage, hydrocephalus, extra-axial collection or mass lesion/mass effect. Mild low-density changes within the periventricular and subcortical white matter most compatible with chronic microvascular ischemic change. Mild diffuse cerebral volume loss. Vascular: Atherosclerotic calcifications involving the large vessels of the skull base. No unexpected hyperdense vessel. Skull: Normal. Negative for fracture or focal lesion. Sinuses/Orbits: Hypoplastic right maxillary sinus. No acute findings. Other: Negative for scalp hematoma. IMPRESSION: 1. No acute intracranial findings. 2. Mild chronic microvascular ischemic change and cerebral volume loss. Electronically Signed   By: Duanne Guess D.O.   On: 04/23/2023 13:06   DG Chest Port 1 View  Result Date: 04/23/2023 CLINICAL DATA:  Altered mental status. EXAM: PORTABLE CHEST 1 VIEW COMPARISON:  One-view chest x-ray 05/07/2022 FINDINGS: The heart is upper limits of normal. Exaggerated by low lung volumes. Atherosclerotic calcifications are present at the aortic arch. No edema or effusion is present. No focal airspace disease is present. Spinal cord stimulator is stable in position. IMPRESSION: 1. Borderline cardiomegaly without failure. 2. No acute cardiopulmonary disease. Electronically Signed   By: Marin Roberts M.D.   On: 04/23/2023 11:51   MR BRAIN W WO CONTRAST  Result Date: 04/19/2023 CLINICAL DATA:  Provided history: History of transient ischemic attack and stroke. Memory loss. Additional history provided by the scanning technologist: The patient reports worsening  headaches. History of prostate cancer and diabetes. EXAM: MRI HEAD WITHOUT AND WITH CONTRAST TECHNIQUE: Multiplanar, multiecho pulse sequences of the brain and surrounding structures were obtained without and with intravenous contrast. CONTRAST:  7mL GADAVIST GADOBUTROL 1 MMOL/ML IV SOLN COMPARISON:  Head CT and CT angiogram head/neck 03/15/2023. Brain MRI 07/13/2019. FINDINGS: Brain: Mild-to-moderate generalized cerebral atrophy. Multifocal T2 FLAIR hyperintense signal abnormality within the cerebral white matter, nonspecific but compatible with mild chronic small vessel ischemic disease. Cavum septum pellucidum and cavum vergae (common anatomic variant). There is no acute infarct. No evidence of an intracranial mass. No chronic intracranial blood products. No extra-axial fluid collection. No midline shift. No pathologic intracranial enhancement identified. Vascular: Maintained  flow voids within the proximal large arterial vessels. Small developmental venous anomaly within the posterior left frontal lobe (anatomic variant). Skull and upper cervical spine: No suspicious marrow lesion. Sinuses/Orbits: No mass or acute finding within the imaged orbits. Prior bilateral ocular lens replacement. No significant inflammatory paranasal sinus disease. Hypoplastic right frontal sinus. IMPRESSION: 1. No evidence of an acute intracranial abnormality. 2. Mild chronic small vessel ischemic changes within the cerebral white matter, similar to the prior brain MRI of 07/13/2019. 3. Mild-to-moderate generalized cerebral atrophy. Electronically Signed   By: Jackey Loge D.O.   On: 04/19/2023 19:20    Catarina Hartshorn, DO  [facility]  If 7PM-7AM, please contact night-coverage [email or web address] Password [email or web address] 04/24/2023, 12:25 PM   LOS: 1 day

## 2023-04-24 NOTE — Progress Notes (Signed)
Notified Dr. Thomes Dinning of K 3.1.

## 2023-04-25 DIAGNOSIS — A419 Sepsis, unspecified organism: Secondary | ICD-10-CM | POA: Diagnosis not present

## 2023-04-25 DIAGNOSIS — G9341 Metabolic encephalopathy: Secondary | ICD-10-CM | POA: Diagnosis not present

## 2023-04-25 DIAGNOSIS — E782 Mixed hyperlipidemia: Secondary | ICD-10-CM

## 2023-04-25 LAB — COMPREHENSIVE METABOLIC PANEL WITH GFR
ALT: 33 U/L (ref 0–44)
AST: 54 U/L — ABNORMAL HIGH (ref 15–41)
Albumin: 2.9 g/dL — ABNORMAL LOW (ref 3.5–5.0)
Alkaline Phosphatase: 71 U/L (ref 38–126)
Anion gap: 5 (ref 5–15)
BUN: 16 mg/dL (ref 8–23)
CO2: 21 mmol/L — ABNORMAL LOW (ref 22–32)
Calcium: 7.8 mg/dL — ABNORMAL LOW (ref 8.9–10.3)
Chloride: 108 mmol/L (ref 98–111)
Creatinine, Ser: 0.75 mg/dL (ref 0.61–1.24)
GFR, Estimated: >60 mL/min (ref >60)
Glucose, Bld: 135 mg/dL — ABNORMAL HIGH (ref 70–99)
Potassium: 3.4 mmol/L — ABNORMAL LOW (ref 3.5–5.1)
Sodium: 134 mmol/L — ABNORMAL LOW (ref 135–145)
Total Bilirubin: 1.7 mg/dL — ABNORMAL HIGH (ref 0.3–1.2)
Total Protein: 5.6 g/dL — ABNORMAL LOW (ref 6.5–8.1)

## 2023-04-25 LAB — MAGNESIUM: Magnesium: 1.7 mg/dL (ref 1.7–2.4)

## 2023-04-25 LAB — CBC
HCT: 36.1 % — ABNORMAL LOW (ref 39.0–52.0)
Hemoglobin: 12.3 g/dL — ABNORMAL LOW (ref 13.0–17.0)
MCH: 31.1 pg (ref 26.0–34.0)
MCHC: 34.1 g/dL (ref 30.0–36.0)
MCV: 91.2 fL (ref 80.0–100.0)
Platelets: 96 10*3/uL — ABNORMAL LOW (ref 150–400)
RBC: 3.96 MIL/uL — ABNORMAL LOW (ref 4.22–5.81)
RDW: 13.5 % (ref 11.5–15.5)
WBC: 3.1 10*3/uL — ABNORMAL LOW (ref 4.0–10.5)
nRBC: 0 % (ref 0.0–0.2)

## 2023-04-25 LAB — TSH: TSH: 1.254 u[IU]/mL (ref 0.350–4.500)

## 2023-04-25 LAB — AMMONIA: Ammonia: 30 umol/L (ref 9–35)

## 2023-04-25 LAB — LYME DISEASE SEROLOGY W/REFLEX: Lyme Total Antibody EIA: NEGATIVE

## 2023-04-25 LAB — T4, FREE: Free T4: 1.3 ng/dL — ABNORMAL HIGH (ref 0.61–1.12)

## 2023-04-25 MED ORDER — POTASSIUM CHLORIDE CRYS ER 20 MEQ PO TBCR
20.0000 meq | EXTENDED_RELEASE_TABLET | Freq: Once | ORAL | Status: AC
  Administered 2023-04-25: 20 meq via ORAL
  Filled 2023-04-25: qty 1

## 2023-04-25 MED ORDER — SODIUM CHLORIDE 0.9 % IV SOLN
1.0000 g | INTRAVENOUS | Status: DC
  Administered 2023-04-25: 1 g via INTRAVENOUS
  Filled 2023-04-25: qty 10

## 2023-04-25 MED ORDER — MAGNESIUM SULFATE 2 GM/50ML IV SOLN
2.0000 g | Freq: Once | INTRAVENOUS | Status: AC
  Administered 2023-04-25: 2 g via INTRAVENOUS
  Filled 2023-04-25: qty 50

## 2023-04-25 MED ORDER — DOXYCYCLINE HYCLATE 100 MG PO TABS: 100.0000 mg | ORAL_TABLET | Freq: Two times a day (BID) | ORAL | 0 refills | Status: DC

## 2023-04-25 NOTE — Progress Notes (Signed)
Mobility Specialist Progress Note:    04/25/23 1025  Mobility  Activity Ambulated with assistance in hallway  Level of Assistance Minimal assist, patient does 75% or more  Assistive Device Front wheel walker  Distance Ambulated (ft) 240 ft  Range of Motion/Exercises Active;All extremities  Activity Response Tolerated well  Mobility Referral Yes  $Mobility charge 1 Mobility  Mobility Specialist Start Time (ACUTE ONLY) 1025  Mobility Specialist Stop Time (ACUTE ONLY) 1035  Mobility Specialist Time Calculation (min) (ACUTE ONLY) 10 min   Pt was received in bed, family in room, agreeable to mobility session. MinA to stand with RW, CGA while ambulating hallway. Tolerated well, asx throughout. Returned pt to room, assisted to chair. All needs met.   Lawerance Bach Mobility Specialist Please contact via Special educational needs teacher or  Rehab office at [phone]

## 2023-04-25 NOTE — Discharge Summary (Signed)
Physician Discharge Summary   Patient: Joel Kim MRN: [id number] DOB: 1946-01-03  Admit date:     04/23/2023  Discharge date: 04/25/23  Discharge Physician: Onalee Hua Keirsten Matuska   PCP: Eartha Inch, MD   Recommendations at discharge:   Please follow up with primary care provider within 1-2 weeks  Please repeat BMP and CBC in one week  Please follow up on/tickborne serologies     Hospital Course: 77 year old male with a history of coronary artery disease, diabetes mellitus type 2, hypertension, hyperlipidemia, hypothyroidism, DVT status post anticoagulation, TIA, OSA, prostate cancer presenting with generalized weakness and altered mental status.  The patient and spouse at the bedside supplements the history.  The patient had a mechanical fall while walking that his dog on 04/20/2023.  He did not lose consciousness.  He states that he fell onto his buttocks.  The patient spouse noticed on the morning of 04/22/2023 that the patient had some generalized weakness.  Apparently he was somnolent and slept most of the day.  However when he woke up in the morning of 04/23/2023, the patient was somnolent and not talkative.  In addition he was not able to get out of bed on his own.  At baseline, the patient is able to ambulate without any assistance.  He is able to provide his own ADLs.  Due to his altered mental status and somnolence and weakness EMS was activated.  The patient denies any chest pain, nausea, vomiting, diarrhea, abdominal pain.  He has had nonproductive cough with some shortness of breath over the past 2 days.  He denies any hemoptysis, hematochezia, melena.  He denies any dysuria or hematuria Notably, the patient has been struggling with chronic headaches, primarily frontal for the last 8 months.  He has been following neurology.  MRI of the brain with and without contrast was ordered on 04/19/2023.  He was negative for any acute findings.  The patient's spouse states that the patient has had  chronic daily headaches for the past 8 months.  Occasionally they have been worsened every 3 to 4 days.  The patient denies any visual disturbance or neck pain or focal extremity weakness.  [place]  They tend to some callus and donkeys.  there is no other exotic pets.  There is not been any recent travels.  However, the patient has been bit by ticks in the recent past.  In the ED, the patient was febrile up to 101.8 F.  He was tachypneic at 25.  WBC 7.4, hemoglobin 14.4, platelets 154,000.  Sodium 130, potassium 3.2, bicarbonate 28, serum creatinine 1.13.  Lactic acid 1.7.  CT of the chest showed enlarged pulmonary artery.  There is bibasilar atelectasis.  There is no consolidation.  There is a nodular hepatic surface.  UA showed 11-20 WBC.  Lactic acid 1.7.  Troponin 14>> 16.  EKG shows sinus rhythm with nonspecific T wave changes.  The patient was started on IV fluids, vancomycin, cefepime.  He was admitted for further evaluation and treatment.  Assessment and Plan: Sepsis -Present on admission -Presented with fever and tachypnea -Suspect urinary source versus bacteremia initlally -further hx shows pt has had numerous tick bites and [place] -Check procalcitonin 0.36 -Lactic acid peaked 1.7 -UA 11-20 WBC -Continue vancomycin and cefepime pending culture data -Chest x-ray negative for infiltrates -CT chest negative for consolidations -COVID-19 PCR negative -Viral respiratory panel negative -04/24/2023 patient still having fevers but trending down. -  04/25/23--afebrile x 24 hours, hemodynamically stable -Added empiric doxycycline to cover for tickborne illness -With cultures being negative, discontinue IV antibiotics and continue doxycycline -04/25/2023 patient remains afebrile the last 24 hours -Discharge home with doxycycline for 6 additional days -RMSF, Ehrlichia, lyme serologies pending at time of dc -04/25/23 VSS at 1730--T97.2  HR  82--RR16--166/84--99%RA   Pyuria -Continue vancomycin and cefepime pending culture data -Urine culture negative>> discontinue vancomycin and cefepime   Acute metabolic encephalopathy -Secondary to infectious process -Check tickborne serologies--pending at time of dc -UA 11-20 WBC -B12--318 -folate--14.2 -TSH--1.254 -ammonia= 30 -04/19/2023 MRI brain with and without contrast negative for any acute findings -wife notes that patient has some cognitive impairment at baseline and has declined cognitively in past 6-8 months -Overall improved--near baseline per the patient's spouse at the bedside on 7/30   Impaired glucose tolerance -03/06/2023 hemoglobin A1c 5.6 -Patient is on Mounjaro   Essential hypertension -Continue metoprolol succinate -Holding lisinopril and hydrochlorothiazide>>restart after d/c   Chronic back pain/opioid dependence -PDMP reviewed -Patient receives [facility] ER 13.5 mg, #60, last refill 04/12/2023 -Patient receives oxycodone 10 mg, #120, last refill 04/12/2023   Hypothyroidism -Continue levothyroxine   Mixed hyperlipidemia -Patient is on Repatha   Coronary artery disease -No chest pain presently -Continue metoprolol and Ranexa -Continue aspirin   Hyponatremia -due to volume depletion -Continue IVNS -improving   Hypokalemia -repleted -check mag--1.9  Thrombocytopenia -due to acute medical illness -CBC one week after dc -no signs of bleeding         Consultants: none Procedures performed: none  Disposition: Home Diet recommendation:  Cardiac diet DISCHARGE MEDICATION: Allergies as of 04/25/2023       Reactions   Tapentadol Rash, Other (See Comments)   Breaks patient out  Nucynta    Paroxetine Hcl Other (See Comments)   Irritability    Statins Other (See Comments)   Muscle aches   Adhesive [tape]    Breaks patient out    Ciprofloxacin Hives   Mannitol Other (See Comments)   Unknown   Reclast [zoledronic Acid] Other (See  Comments)   Ended up in hospital/ high fever and    Water For Injection [water, Sterile]    Flagyl [metronidazole] Rash   Latex Rash   Irritated skin        Medication List     TAKE these medications    albuterol 108 (90 Base) MCG/ACT inhaler Commonly known as: VENTOLIN HFA Inhale 2 puffs into the lungs every 6 (six) hours as needed. What changed: reasons to take this   Aspirin 81 MG Caps Take 1 capsule by mouth every evening.   divalproex 500 MG 24 hr tablet Commonly known as: Depakote ER Take 1 tablet (500 mg total) by mouth daily.   doxycycline 100 MG tablet Commonly known as: VIBRA-TABS Take 1 tablet (100 mg total) by mouth every 12 (twelve) hours.   hydrochlorothiazide 25 MG tablet Commonly known as: HYDRODIURIL Take 25 mg by mouth daily.   levothyroxine 100 MCG tablet Commonly known as: SYNTHROID Take 100 mcg by mouth daily before breakfast.   lisinopril 5 MG tablet Commonly known as: ZESTRIL Take 5 mg by mouth daily.   metoprolol succinate 50 MG 24 hr tablet Commonly known as: TOPROL-XL Take 1 tablet by mouth every evening.   Mounjaro 5 MG/0.5ML Pen Generic drug: tirzepatide Inject 5 mg into the skin once a week.   multivitamin with minerals tablet Take 1 tablet by mouth daily.   mupirocin ointment 2 % Commonly known  as: BACTROBAN Apply 1 Application topically 3 (three) times daily.   Oxycodone HCl 10 MG Tabs Take 10 mg by mouth 4 (four) times daily as needed.   pantoprazole 40 MG tablet Commonly known as: PROTONIX Take 1 tablet (40 mg total) by mouth daily.   polyethylene glycol 17 g packet Commonly known as: MIRALAX / GLYCOLAX Take 17 g by mouth daily as needed for mild constipation.   ranolazine 500 MG 12 hr tablet Commonly known as: RANEXA Take 500 mg by mouth 2 (two) times daily.   Repatha SureClick 140 MG/ML Soaj Generic drug: Evolocumab Inject into the skin.   SUMAtriptan 50 MG tablet Commonly known as: IMITREX Take 1 tablet  (50 mg total) by mouth as needed. Every 2 hrs as needed   traZODone 150 MG tablet Commonly known as: DESYREL Take 75 mg by mouth at bedtime.   vitamin C 1000 MG tablet Take 1,000 mg by mouth daily.   Vitamin D3 50 MCG (2000 UT) Tabs Take 2,000 Units by mouth daily.   Xtampza ER 13.5 MG C12a Generic drug: oxyCODONE ER Take 13.5 mg by mouth every 12 (twelve) hours.        Discharge Exam: Filed Weights   04/23/23 1047  Weight: 74.7 kg   HEENT:  [place]/AT, No thrush, no icterus; no meningismus CV:  RRR, no rub, no S3, no S4 Lung:  CTA, no wheeze, no rhonchi Abd:  soft/+BS, NT Ext:  No edema, no lymphangitis, no synovitis, no rash   Condition at discharge: stable  The results of significant diagnostics from this hospitalization (including imaging, microbiology, ancillary and laboratory) are listed below for reference.   Imaging Studies: CT Chest W Contrast  Result Date: 04/23/2023 CLINICAL DATA:  Pneumonia, complication suspected, xray done EXAM: CT CHEST WITH CONTRAST TECHNIQUE: Multidetector CT imaging of the chest was performed during intravenous contrast administration. RADIATION DOSE REDUCTION: This exam was performed according to the departmental dose-optimization program which includes automated exposure control, adjustment of the mA and/or kV according to patient size and/or use of iterative reconstruction technique. CONTRAST:  75mL OMNIPAQUE IOHEXOL 300 MG/ML  SOLN COMPARISON:  05/07/2022 FINDINGS: Cardiovascular: Heart size is upper limits of normal. No pericardial effusion. Thoracic aorta is nonaneurysmal. Atherosclerotic calcifications of the aorta and coronary arteries. Main pulmonary artery measures 3.5 cm in diameter. Mediastinum/Nodes: No enlarged mediastinal, hilar, or axillary lymph nodes. Thyroid gland, trachea, and esophagus demonstrate no significant findings. Lungs/Pleura: Mild dependent bibasilar subsegmental atelectasis. Lungs are otherwise clear. No pleural  effusion or pneumothorax. Upper Abdomen: Decreased attenuation of the hepatic parenchyma. Slightly nodular hepatic surface contour. Recanalization of the umbilical vein. No acute abnormality. Musculoskeletal: Midthoracic spinal stimulator in place. Degenerative changes of both shoulders. No acute bony or chest wall abnormality. IMPRESSION: 1. No acute intrathoracic findings. 2. Mild dependent bibasilar subsegmental atelectasis. Lungs otherwise clear. 3. Enlarged main pulmonary artery, which can be seen in the setting of pulmonary arterial hypertension. 4. Aortic and coronary artery atherosclerosis (ICD10-I70.0). 5. Hepatic steatosis with slightly nodular hepatic surface contour and recanalization of the umbilical vein, suggestive of cirrhosis. Correlate with liver function tests. Electronically Signed   By: Duanne Guess D.O.   On: 04/23/2023 13:13   CT Head Wo Contrast  Result Date: 04/23/2023 CLINICAL DATA:  Head trauma, minor (Age >= 65y) EXAM: CT HEAD WITHOUT CONTRAST TECHNIQUE: Contiguous axial images were obtained from the base of the skull through the vertex without intravenous contrast. RADIATION DOSE REDUCTION: This exam was performed according to the departmental  dose-optimization program which includes automated exposure control, adjustment of the mA and/or kV according to patient size and/or use of iterative reconstruction technique. COMPARISON:  03/15/2023 FINDINGS: Brain: No evidence of acute infarction, hemorrhage, hydrocephalus, extra-axial collection or mass lesion/mass effect. Mild low-density changes within the periventricular and subcortical white matter most compatible with chronic microvascular ischemic change. Mild diffuse cerebral volume loss. Vascular: Atherosclerotic calcifications involving the large vessels of the skull base. No unexpected hyperdense vessel. Skull: Normal. Negative for fracture or focal lesion. Sinuses/Orbits: Hypoplastic right maxillary sinus. No acute findings.  Other: Negative for scalp hematoma. IMPRESSION: 1. No acute intracranial findings. 2. Mild chronic microvascular ischemic change and cerebral volume loss. Electronically Signed   By: Duanne Guess D.O.   On: 04/23/2023 13:06   DG Chest Port 1 View  Result Date: 04/23/2023 CLINICAL DATA:  Altered mental status. EXAM: PORTABLE CHEST 1 VIEW COMPARISON:  One-view chest x-ray 05/07/2022 FINDINGS: The heart is upper limits of normal. Exaggerated by low lung volumes. Atherosclerotic calcifications are present at the aortic arch. No edema or effusion is present. No focal airspace disease is present. Spinal cord stimulator is stable in position. IMPRESSION: 1. Borderline cardiomegaly without failure. 2. No acute cardiopulmonary disease. Electronically Signed   By: Marin Roberts M.D.   On: 04/23/2023 11:51   MR BRAIN W WO CONTRAST  Result Date: 04/19/2023 CLINICAL DATA:  Provided history: History of transient ischemic attack and stroke. Memory loss. Additional history provided by the scanning technologist: The patient reports worsening headaches. History of prostate cancer and diabetes. EXAM: MRI HEAD WITHOUT AND WITH CONTRAST TECHNIQUE: Multiplanar, multiecho pulse sequences of the brain and surrounding structures were obtained without and with intravenous contrast. CONTRAST:  7mL GADAVIST GADOBUTROL 1 MMOL/ML IV SOLN COMPARISON:  Head CT and CT angiogram head/neck 03/15/2023. Brain MRI 07/13/2019. FINDINGS: Brain: Mild-to-moderate generalized cerebral atrophy. Multifocal T2 FLAIR hyperintense signal abnormality within the cerebral white matter, nonspecific but compatible with mild chronic small vessel ischemic disease. Cavum septum pellucidum and cavum vergae (common anatomic variant). There is no acute infarct. No evidence of an intracranial mass. No chronic intracranial blood products. No extra-axial fluid collection. No midline shift. No pathologic intracranial enhancement identified. Vascular:  Maintained flow voids within the proximal large arterial vessels. Small developmental venous anomaly within the posterior left frontal lobe (anatomic variant). Skull and upper cervical spine: No suspicious marrow lesion. Sinuses/Orbits: No mass or acute finding within the imaged orbits. Prior bilateral ocular lens replacement. No significant inflammatory paranasal sinus disease. Hypoplastic right frontal sinus. IMPRESSION: 1. No evidence of an acute intracranial abnormality. 2. Mild chronic small vessel ischemic changes within the cerebral white matter, similar to the prior brain MRI of 07/13/2019. 3. Mild-to-moderate generalized cerebral atrophy. Electronically Signed   By: Jackey Loge D.O.   On: 04/19/2023 19:20    Microbiology: Results for orders placed or performed during the hospital encounter of 04/23/23  Blood Culture (routine x 2)     Status: None (Preliminary result)   Collection Time: 04/23/23 11:36 AM   Specimen: Right Antecubital; Blood  Result Value Ref Range Status   Specimen Description   Final    RIGHT ANTECUBITAL BOTTLES DRAWN AEROBIC AND ANAEROBIC   Special Requests Blood Culture adequate volume  Final   Culture   Final    NO GROWTH 2 DAYS Performed at [facility], [address] [place], [place] [address]    Report Status PENDING  Incomplete  Resp panel by RT-PCR (RSV, Flu A&B, Covid) Anterior Nasal  Swab     Status: None   Collection Time: 04/23/23 12:00 PM   Specimen: Anterior Nasal Swab  Result Value Ref Range Status   SARS Coronavirus 2 by RT PCR NEGATIVE NEGATIVE Final    Comment: (NOTE) SARS-CoV-2 target nucleic acids are NOT DETECTED.  The SARS-CoV-2 RNA is generally detectable in upper respiratory specimens during the acute phase of infection. The lowest concentration of SARS-CoV-2 viral copies this assay can detect is 138 copies/mL. A negative result does not preclude SARS-Cov-2 infection and should not be used as the sole basis for treatment or other patient  management decisions. A negative result may occur with  improper specimen collection/handling, submission of specimen other than nasopharyngeal swab, presence of viral mutation(s) within the areas targeted by this assay, and inadequate number of viral copies(<138 copies/mL). A negative result must be combined with clinical observations, patient history, and epidemiological information. The expected result is Negative.  Fact Sheet for Patients:  BloggerCourse.com  Fact Sheet for Healthcare Providers:  SeriousBroker.it  This test is no t yet approved or cleared by the [place] FDA and  has been authorized for detection and/or diagnosis of SARS-CoV-2 by FDA under an Emergency Use Authorization (EUA). This EUA will remain  in effect (meaning this test can be used) for the duration of the COVID-19 declaration under Section 564(b)(1) of the Act, 21 U.S.C.section 360bbb-3(b)(1), unless the authorization is terminated  or revoked sooner.       Influenza A by PCR NEGATIVE NEGATIVE Final   Influenza B by PCR NEGATIVE NEGATIVE Final    Comment: (NOTE) The Xpert Xpress SARS-CoV-2/FLU/RSV plus assay is intended as an aid in the diagnosis of influenza from Nasopharyngeal swab specimens and should not be used as a sole basis for treatment. Nasal washings and aspirates are unacceptable for Xpert Xpress SARS-CoV-2/FLU/RSV testing.  Fact Sheet for Patients: BloggerCourse.com  Fact Sheet for Healthcare Providers: SeriousBroker.it  This test is not yet approved or cleared by the [place] FDA and has been authorized for detection and/or diagnosis of SARS-CoV-2 by FDA under an Emergency Use Authorization (EUA). This EUA will remain in effect (meaning this test can be used) for the duration of the COVID-19 declaration under Section 564(b)(1) of the Act, 21 U.S.C. section 360bbb-3(b)(1),  unless the authorization is terminated or revoked.     Resp Syncytial Virus by PCR NEGATIVE NEGATIVE Final    Comment: (NOTE) Fact Sheet for Patients: BloggerCourse.com  Fact Sheet for Healthcare Providers: SeriousBroker.it  This test is not yet approved or cleared by the [place] FDA and has been authorized for detection and/or diagnosis of SARS-CoV-2 by FDA under an Emergency Use Authorization (EUA). This EUA will remain in effect (meaning this test can be used) for the duration of the COVID-19 declaration under Section 564(b)(1) of the Act, 21 U.S.C. section 360bbb-3(b)(1), unless the authorization is terminated or revoked.  Performed at [facility], [address] [place], [place] [address]   Blood Culture (routine x 2)     Status: None (Preliminary result)   Collection Time: 04/23/23  1:15 PM   Specimen: Right Antecubital; Blood  Result Value Ref Range Status   Specimen Description   Final    RIGHT ANTECUBITAL BOTTLES DRAWN AEROBIC AND ANAEROBIC   Special Requests Blood Culture adequate volume  Final   Culture   Final    NO GROWTH 2 DAYS Performed at [facility], [address] [place], [place] [address]    Report Status PENDING  Incomplete  Urine Culture (for pregnant, neutropenic or urologic patients or patients with an indwelling urinary catheter)     Status: None   Collection Time: 04/23/23  3:14 PM   Specimen: Urine, Catheterized  Result Value Ref Range Status   Specimen Description   Final    URINE, CATHETERIZED Performed at [facility], [address] [place], [place] [address]    Special Requests   Final    NONE Performed at [facility], [address], [place], [place] [address]    Culture   Final    NO GROWTH Performed at [facility], [address], [place], [place] [address]    Report Status 04/24/2023 FINAL  Final  Respiratory (~20 pathogens) panel by PCR     Status: None   Collection  Time: 04/23/23  3:57 PM   Specimen: Nasopharyngeal Swab; Respiratory  Result Value Ref Range Status   Adenovirus NOT DETECTED NOT DETECTED Final   Coronavirus 229E NOT DETECTED NOT DETECTED Final    Comment: (NOTE) The Coronavirus on the Respiratory Panel, DOES NOT test for the novel  Coronavirus (2019 nCoV)    Coronavirus HKU1 NOT DETECTED NOT DETECTED Final   Coronavirus NL63 NOT DETECTED NOT DETECTED Final   Coronavirus OC43 NOT DETECTED NOT DETECTED Final   Metapneumovirus NOT DETECTED NOT DETECTED Final   Rhinovirus / Enterovirus NOT DETECTED NOT DETECTED Final   Influenza A NOT DETECTED NOT DETECTED Final   Influenza B NOT DETECTED NOT DETECTED Final   Parainfluenza Virus 1 NOT DETECTED NOT DETECTED Final   Parainfluenza Virus 2 NOT DETECTED NOT DETECTED Final   Parainfluenza Virus 3 NOT DETECTED NOT DETECTED Final   Parainfluenza Virus 4 NOT DETECTED NOT DETECTED Final   Respiratory Syncytial Virus NOT DETECTED NOT DETECTED Final   Bordetella pertussis NOT DETECTED NOT DETECTED Final   Bordetella Parapertussis NOT DETECTED NOT DETECTED Final   Chlamydophila pneumoniae NOT DETECTED NOT DETECTED Final   Mycoplasma pneumoniae NOT DETECTED NOT DETECTED Final    Comment: Performed at [facility], [address], [place], [place] [address]    Labs: CBC: Recent Labs  Lab 04/23/23 1052 04/24/23 0453 04/25/23 0840  WBC 7.4 7.2 3.1*  HGB 14.4 15.1 12.3*  HCT 40.0 43.3 36.1*  MCV 88.3 91.2 91.2  PLT 154 129* 96*   Basic Metabolic Panel: Recent Labs  Lab 04/23/23 1050 04/24/23 0453 04/25/23 0840  NA 130* 137 134*  K 3.2* 3.1* 3.4*  CL 96* 101 108  CO2 28 27 21*  GLUCOSE 139* 99 135*  BUN 19 17 16   CREATININE 1.13 1.01 0.75  CALCIUM 8.2* 8.5* 7.8*  MG  --  1.9 1.7   Liver Function Tests: Recent Labs  Lab 04/23/23 1050 04/24/23 0453 04/25/23 0840  AST 37 54* 54*  ALT 25 29 33  ALKPHOS 81 84 71  BILITOT 1.8* 1.9* 1.7*  PROT 6.6 6.7 5.6*  ALBUMIN 3.5  3.5 2.9*   CBG: Recent Labs  Lab 04/23/23 1058  GLUCAP 151*    Discharge time spent: greater than 30 minutes.  Signed: Catarina Hartshorn, MD [facility] 04/25/2023

## 2023-07-04 ENCOUNTER — Ambulatory Visit: Payer: Medicare HMO | Admitting: Adult Health

## 2023-08-23 ENCOUNTER — Other Ambulatory Visit: Payer: Self-pay

## 2023-08-23 ENCOUNTER — Ambulatory Visit: Payer: Medicare HMO | Attending: Physician Assistant

## 2023-08-23 DIAGNOSIS — Z9181 History of falling: Secondary | ICD-10-CM | POA: Diagnosis present

## 2023-08-23 DIAGNOSIS — M6281 Muscle weakness (generalized): Secondary | ICD-10-CM | POA: Insufficient documentation

## 2023-08-23 NOTE — Therapy (Signed)
OUTPATIENT PHYSICAL THERAPY NEURO EVALUATION   Patient Name: Joel Kim MRN: [id number] DOB:06/11/1946, 77 y.o., male Today's Date: 08/23/2023  REFERRING PROVIDER: Julien Girt, PA-C   END OF SESSION:  PT End of Session - 08/23/23 1303     Visit Number 1    Number of Visits 6    Date for PT Re-Evaluation 10/27/23    PT Start Time 1303    PT Stop Time 1337    PT Time Calculation (min) 34 min    Activity Tolerance Patient tolerated treatment well    Behavior During Therapy [facility] for tasks assessed/performed             Past Medical History:  Diagnosis Date   Asthma    Atypical nevus 01/26/2006   right side of body (slight)   Basal cell carcinoma 11/17/2015   left ear rim tx cx3 71fu   Cancer (HCC)    Hypertension    Hypothyroidism    Insomnia    Intractable migraine without aura    Melanoma (HCC) 10/10/2014   right ant. shoulder (exc)   Neuromuscular disorder (HCC)    feet   Pancreatitis    Pre-diabetes    Prostate CA (HCC)    Spondylosis of lumbar spine    Stroke (HCC) 2021   TIA x 2 some memory loss.   Thyroid disease    Past Surgical History:  Procedure Laterality Date   BACK SURGERY  2013   L4-L5 and a redo in 2014 after a fall   CHOLECYSTECTOMY  1992   FOOT SURGERY Left 1999   PROSTATECTOMY  2008   ROTATOR CUFF REPAIR Left 2021   SPINAL CORD STIMULATOR IMPLANT  2020   TOTAL KNEE ARTHROPLASTY Right 2005   TOTAL KNEE ARTHROPLASTY Left 01/19/2022   Procedure: TOTAL KNEE ARTHROPLASTY;  Surgeon: Eugenia Mcalpine, MD;  Location: [facility];  Service: Orthopedics;  Laterality: Left;  adductor canal 120   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   Hypokalemia 01/25/2022   Acute anemia 01/25/2022   Acute metabolic  encephalopathy 01/25/2022   Osteoarthritis of left knee 01/19/2022   Atypical chest pain 08/15/2018   Hyperlipidemia 08/15/2018   Family history of heart disease 08/15/2018   Hypertension 05/31/2018   Hypothyroidism 05/31/2018   Acute pancreatitis 05/31/2018   Mild renal insufficiency 05/31/2018    ONSET DATE: years ago  REFERRING DIAG: Unspecified abnormalities of gait and mobility; Other abnormalities of gait and mobility   THERAPY DIAG:  History of falling  Muscle weakness (generalized)  Rationale for Evaluation and Treatment: Rehabilitation  SUBJECTIVE:  SUBJECTIVE STATEMENT: Patient reports that he has fallen twice in the past month when he was trying to go under his fence, but his foot caught his fence which caused him to fall. He feels that his back is his main problem, but is not having trouble walking around. He feels that his legs will get weak, but it is never both legs at the same time.  Pt accompanied by: self  PERTINENT HISTORY: Hypertension, osteoarthritis, history of a CVA, asthma, history of cancer, and chronic low back pain   PAIN:  Are you having pain? Yes: NPRS scale: 6/10 Pain location: back and both legs  Pain description: aching and intermittent weakness  PRECAUTIONS: Fall  RED FLAGS: None   WEIGHT BEARING RESTRICTIONS: No  FALLS: Has patient fallen in last 6 months? Yes. Number of falls 2  LIVING ENVIRONMENT: Lives with: lives with their family Lives in: House/apartment Stairs: Yes: Internal: 13 steps; on left going up; he avoids these steps if possible, varies between reciprocal and step to pattern Has following equipment at home: Quad cane small base  PLOF: Independent  PATIENT GOALS: improved safety  OBJECTIVE:  Note: Objective measures were completed  at Evaluation unless otherwise noted.  COGNITION: Overall cognitive status: Within functional limits for tasks assessed   SENSATION: Patient reports intermittent tingling in his legs when he lays down at night.   COORDINATION: WFL   EDEMA:  No edema observed  MUSCLE TONE: WFL  POSTURE: forward head, decreased lumbar lordosis, and flexed trunk   LOWER EXTREMITY ROM: WFL for activities assessed  LOWER EXTREMITY MMT:    MMT Right Eval Left Eval  Hip flexion 4/5 4-/5  Hip extension    Hip abduction    Hip adduction    Hip internal rotation    Hip external rotation    Knee flexion 4/5 4/5  Knee extension 4+/5 4+/5  Ankle dorsiflexion 4/5 4/5  Ankle plantarflexion    Ankle inversion    Ankle eversion    (Blank rows = not tested)  TRANSFERS: Assistive device utilized: Counselling psychologist  Sit to stand: Complete Independence Stand to sit: Complete Independence  GAIT: Gait pattern: step through pattern, decreased stride length, poor foot clearance- Right, and poor foot clearance- Left Assistive device utilized: Quad cane small base and None Level of assistance: SBA Comments: close supervision was required when ambulating with his cane due to inconsistent usage and kicking his cane with his right foot  FUNCTIONAL TESTS:  5 times sit to stand: 10.28 seconds without UE support Timed up and go (TUG): 12.20 seconds without an assistive device  TODAY'S TREATMENT:                                                                                                                              DATE:     PATIENT EDUCATION: Education details: plan of care, prognosis, safety, healing, and goals for therapy Person educated: Patient and Spouse Education  method: Explanation Education comprehension: verbalized understanding  HOME EXERCISE PROGRAM:   GOALS: Goals reviewed with patient? Yes  LONG TERM GOALS: Target date: 09/20/23  Patient will be independent with his HEP.   Baseline:  Goal status: INITIAL  2.  Patient will be able to demonstrate proper cane utilization while walking to reduce his risk of falling.  Baseline:  Goal status: INITIAL  3.  Patient will be able to safely ambulate with minimal to no significant gait deviations.  Baseline:  Goal status: INITIAL  ASSESSMENT:  CLINICAL IMPRESSION: Patient is a 77 y.o. male who was seen today for physical therapy evaluation and treatment for gait deviations and a history of falling. He presented with an elevated fall risk as evidenced by his gait mechanics and his history of falling. Recommend that he continue with skilled physical therapy to address his remaining impairments to maximize his safety and functional mobility.     OBJECTIVE IMPAIRMENTS: Abnormal gait, decreased balance, and decreased strength.   ACTIVITY LIMITATIONS: stairs and locomotion level  PARTICIPATION LIMITATIONS: yard work  PERSONAL FACTORS: Age, Time since onset of injury/illness/exacerbation, and 3+ comorbidities: Hypertension, osteoarthritis, history of a CVA, asthma, history of cancer, and chronic low back pain   are also affecting patient's functional outcome.   REHAB POTENTIAL: Good  CLINICAL DECISION MAKING: Stable/uncomplicated  EVALUATION COMPLEXITY: Low  PLAN:  PT FREQUENCY: 1-2x/week  PT DURATION: 4 weeks  PLANNED INTERVENTIONS: 97164- PT Re-evaluation, 97110-Therapeutic exercises, 97530- Therapeutic activity, 97112- Neuromuscular re-education, 97535- Self Care, 16109- Manual therapy, [phone]- Gait training, Patient/Family education, Balance training, and Stair training  PLAN FOR NEXT SESSION: Nustep, gait training, lower extremity strengthening, and balance interventions   Granville Lewis, PT 08/23/2023, 3:27 PM

## 2023-08-29 ENCOUNTER — Ambulatory Visit: Payer: Medicare HMO

## 2023-08-31 ENCOUNTER — Encounter: Payer: PRIVATE HEALTH INSURANCE | Admitting: *Deleted

## 2023-09-05 ENCOUNTER — Other Ambulatory Visit (HOSPITAL_COMMUNITY): Payer: Self-pay | Admitting: Orthopedic Surgery

## 2023-09-05 ENCOUNTER — Ambulatory Visit: Payer: Medicare HMO | Attending: Physician Assistant

## 2023-09-05 DIAGNOSIS — Z9181 History of falling: Secondary | ICD-10-CM | POA: Insufficient documentation

## 2023-09-05 DIAGNOSIS — T8484XD Pain due to internal orthopedic prosthetic devices, implants and grafts, subsequent encounter: Secondary | ICD-10-CM

## 2023-09-05 DIAGNOSIS — M6281 Muscle weakness (generalized): Secondary | ICD-10-CM | POA: Insufficient documentation

## 2023-09-05 NOTE — Therapy (Signed)
OUTPATIENT PHYSICAL THERAPY NEURO TREATMENT   Patient Name: Joel Kim MRN: [id number] DOB:1945/10/04, 77 y.o., male Today's Date: 09/05/2023  REFERRING PROVIDER: Julien Girt, PA-C   END OF SESSION:  PT End of Session - 09/05/23 1310     Visit Number 2    Number of Visits 6    Date for PT Re-Evaluation 10/27/23    PT Start Time 1300    PT Stop Time 1345    PT Time Calculation (min) 45 min    Activity Tolerance Patient tolerated treatment well    Behavior During Therapy [facility] for tasks assessed/performed             Past Medical History:  Diagnosis Date   Asthma    Atypical nevus 01/26/2006   right side of body (slight)   Basal cell carcinoma 11/17/2015   left ear rim tx cx3 30fu   Cancer (HCC)    Hypertension    Hypothyroidism    Insomnia    Intractable migraine without aura    Melanoma (HCC) 10/10/2014   right ant. shoulder (exc)   Neuromuscular disorder (HCC)    feet   Pancreatitis    Pre-diabetes    Prostate CA (HCC)    Spondylosis of lumbar spine    Stroke (HCC) 2021   TIA x 2 some memory loss.   Thyroid disease    Past Surgical History:  Procedure Laterality Date   BACK SURGERY  2013   L4-L5 and a redo in 2014 after a fall   CHOLECYSTECTOMY  1992   FOOT SURGERY Left 1999   PROSTATECTOMY  2008   ROTATOR CUFF REPAIR Left 2021   SPINAL CORD STIMULATOR IMPLANT  2020   TOTAL KNEE ARTHROPLASTY Right 2005   TOTAL KNEE ARTHROPLASTY Left 01/19/2022   Procedure: TOTAL KNEE ARTHROPLASTY;  Surgeon: Eugenia Mcalpine, MD;  Location: [facility];  Service: Orthopedics;  Laterality: Left;  adductor canal 120   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   Hypokalemia 01/25/2022   Acute anemia 01/25/2022   Acute metabolic  encephalopathy 01/25/2022   Osteoarthritis of left knee 01/19/2022   Atypical chest pain 08/15/2018   Hyperlipidemia 08/15/2018   Family history of heart disease 08/15/2018   Hypertension 05/31/2018   Hypothyroidism 05/31/2018   Acute pancreatitis 05/31/2018   Mild renal insufficiency 05/31/2018    ONSET DATE: years ago  REFERRING DIAG: Unspecified abnormalities of gait and mobility; Other abnormalities of gait and mobility   THERAPY DIAG:  History of falling  Muscle weakness (generalized)  Rationale for Evaluation and Treatment: Rehabilitation  SUBJECTIVE:  SUBJECTIVE STATEMENT: Pt reports left knee pain today.  Pt's wife reports that pt saw orthopedic today and MD stated that distal portion of left knee replacement is coming loose and surgery will be required sooner than later.  Pt accompanied by: self  PERTINENT HISTORY: Hypertension, osteoarthritis, history of a CVA, asthma, history of cancer, and chronic low back pain   PAIN:  Are you having pain? Yes: NPRS scale: 5/10 Pain location: left knee  Pain description: aching and intermittent weakness  PRECAUTIONS: Fall  RED FLAGS: None   WEIGHT BEARING RESTRICTIONS: No  FALLS: Has patient fallen in last 6 months? Yes. Number of falls 2  LIVING ENVIRONMENT: Lives with: lives with their family Lives in: House/apartment Stairs: Yes: Internal: 13 steps; on left going up; he avoids these steps if possible, varies between reciprocal and step to pattern Has following equipment at home: Quad cane small base  PLOF: Independent  PATIENT GOALS: improved safety  OBJECTIVE:  Note: Objective measures were completed at Evaluation unless otherwise noted.  COGNITION: Overall cognitive status: Within functional limits for tasks  assessed   SENSATION: Patient reports intermittent tingling in his legs when he lays down at night.   COORDINATION: WFL   EDEMA:  No edema observed  MUSCLE TONE: WFL  POSTURE: forward head, decreased lumbar lordosis, and flexed trunk   LOWER EXTREMITY ROM: WFL for activities assessed  LOWER EXTREMITY MMT:    MMT Right Eval Left Eval  Hip flexion 4/5 4-/5  Hip extension    Hip abduction    Hip adduction    Hip internal rotation    Hip external rotation    Knee flexion 4/5 4/5  Knee extension 4+/5 4+/5  Ankle dorsiflexion 4/5 4/5  Ankle plantarflexion    Ankle inversion    Ankle eversion    (Blank rows = not tested)  TRANSFERS: Assistive device utilized: Counselling psychologist  Sit to stand: Complete Independence Stand to sit: Complete Independence  GAIT: Gait pattern: step through pattern, decreased stride length, poor foot clearance- Right, and poor foot clearance- Left Assistive device utilized: Quad cane small base and None Level of assistance: SBA Comments: close supervision was required when ambulating with his cane due to inconsistent usage and kicking his cane with his right foot  FUNCTIONAL TESTS:  5 times sit to stand: 10.28 seconds without UE support Timed up and go (TUG): 12.20 seconds without an assistive device  TODAY'S TREATMENT:                                                                                                                              DATE:                  09/05/23                   EXERCISE LOG  Exercise Repetitions and Resistance Comments  Nustep  Lvl 2 x 15 mins   Heel/Toe  Raises 25 reps each   Standing Marches Discontinued d/t knee popping   NBOS 3 mins   LAQs 2# x 25 reps bil   Seated Marches 2# x 20 reps bil   Seated Hip Abduction Red x 3 mins   Seated Hip Adduction 3 mins    Blank cell = exercise not performed today   PATIENT EDUCATION: Education details: plan of care, prognosis, safety, healing, and goals for  therapy Person educated: Patient and Spouse Education method: Explanation Education comprehension: verbalized understanding  HOME EXERCISE PROGRAM:   GOALS: Goals reviewed with patient? Yes  LONG TERM GOALS: Target date: 09/20/23  Patient will be independent with his HEP.  Baseline:  Goal status: INITIAL  2.  Patient will be able to demonstrate proper cane utilization while walking to reduce his risk of falling.  Baseline:  Goal status: INITIAL  3.  Patient will be able to safely ambulate with minimal to no significant gait deviations.  Baseline:  Goal status: INITIAL  ASSESSMENT:  CLINICAL IMPRESSION:  Pt arrives for today's treatment session reporting 5/10 left knee pain.  Pt reports seeing the ortho earlier today and reports that the distal portion of his knee replacement is mis-fitting at this time.  Surgeon is planning a bone scan and CT scan prior to further surgery.  According to surgeon there are no contraindications to therapy at this time.  Pt introduced to standing and seated exercises today to increase strength and function.  Standing marching discontinued due to discomfort and popping of left knee.  Pt denied any change in pain at completion of today's treatment session.   OBJECTIVE IMPAIRMENTS: Abnormal gait, decreased balance, and decreased strength.   ACTIVITY LIMITATIONS: stairs and locomotion level  PARTICIPATION LIMITATIONS: yard work  PERSONAL FACTORS: Age, Time since onset of injury/illness/exacerbation, and 3+ comorbidities: Hypertension, osteoarthritis, history of a CVA, asthma, history of cancer, and chronic low back pain   are also affecting patient's functional outcome.   REHAB POTENTIAL: Good  CLINICAL DECISION MAKING: Stable/uncomplicated  EVALUATION COMPLEXITY: Low  PLAN:  PT FREQUENCY: 1-2x/week  PT DURATION: 4 weeks  PLANNED INTERVENTIONS: 97164- PT Re-evaluation, 97110-Therapeutic exercises, 97530- Therapeutic activity, 97112-  Neuromuscular re-education, 97535- Self Care, 78295- Manual therapy, [phone]- Gait training, Patient/Family education, Balance training, and Stair training  PLAN FOR NEXT SESSION: Nustep, gait training, lower extremity strengthening, and balance interventions   Newman Pies, PTA 09/05/2023, 1:56 PM

## 2023-09-07 ENCOUNTER — Ambulatory Visit: Payer: Medicare HMO

## 2023-09-07 DIAGNOSIS — M6281 Muscle weakness (generalized): Secondary | ICD-10-CM

## 2023-09-07 DIAGNOSIS — Z9181 History of falling: Secondary | ICD-10-CM | POA: Diagnosis not present

## 2023-09-07 NOTE — Therapy (Signed)
OUTPATIENT PHYSICAL THERAPY NEURO TREATMENT   Patient Name: Joel Kim MRN: [id number] DOB:03/22/1946, 77 y.o., male Today's Date: 09/07/2023  REFERRING PROVIDER: Julien Girt, PA-C   END OF SESSION:  PT End of Session - 09/07/23 1303     Visit Number 3    Number of Visits 6    Date for PT Re-Evaluation 10/27/23    PT Start Time 1300    PT Stop Time 1348    PT Time Calculation (min) 48 min    Activity Tolerance Patient tolerated treatment well    Behavior During Therapy [facility] for tasks assessed/performed             Past Medical History:  Diagnosis Date   Asthma    Atypical nevus 01/26/2006   right side of body (slight)   Basal cell carcinoma 11/17/2015   left ear rim tx cx3 81fu   Cancer (HCC)    Hypertension    Hypothyroidism    Insomnia    Intractable migraine without aura    Melanoma (HCC) 10/10/2014   right ant. shoulder (exc)   Neuromuscular disorder (HCC)    feet   Pancreatitis    Pre-diabetes    Prostate CA (HCC)    Spondylosis of lumbar spine    Stroke (HCC) 2021   TIA x 2 some memory loss.   Thyroid disease    Past Surgical History:  Procedure Laterality Date   BACK SURGERY  2013   L4-L5 and a redo in 2014 after a fall   CHOLECYSTECTOMY  1992   FOOT SURGERY Left 1999   PROSTATECTOMY  2008   ROTATOR CUFF REPAIR Left 2021   SPINAL CORD STIMULATOR IMPLANT  2020   TOTAL KNEE ARTHROPLASTY Right 2005   TOTAL KNEE ARTHROPLASTY Left 01/19/2022   Procedure: TOTAL KNEE ARTHROPLASTY;  Surgeon: Eugenia Mcalpine, MD;  Location: [facility];  Service: Orthopedics;  Laterality: Left;  adductor canal 120   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   Hypokalemia 01/25/2022   Acute anemia 01/25/2022   Acute metabolic  encephalopathy 01/25/2022   Osteoarthritis of left knee 01/19/2022   Atypical chest pain 08/15/2018   Hyperlipidemia 08/15/2018   Family history of heart disease 08/15/2018   Hypertension 05/31/2018   Hypothyroidism 05/31/2018   Acute pancreatitis 05/31/2018   Mild renal insufficiency 05/31/2018    ONSET DATE: years ago  REFERRING DIAG: Unspecified abnormalities of gait and mobility; Other abnormalities of gait and mobility   THERAPY DIAG:  History of falling  Muscle weakness (generalized)  Rationale for Evaluation and Treatment: Rehabilitation  SUBJECTIVE:  SUBJECTIVE STATEMENT: Pt report 4/10 left knee pain today.   Pt accompanied by: self  PERTINENT HISTORY: Hypertension, osteoarthritis, history of a CVA, asthma, history of cancer, and chronic low back pain   PAIN:  Are you having pain? Yes: NPRS scale: 4/10 Pain location: left knee  Pain description: aching and intermittent weakness  PRECAUTIONS: Fall  RED FLAGS: None   WEIGHT BEARING RESTRICTIONS: No  FALLS: Has patient fallen in last 6 months? Yes. Number of falls 2  LIVING ENVIRONMENT: Lives with: lives with their family Lives in: House/apartment Stairs: Yes: Internal: 13 steps; on left going up; he avoids these steps if possible, varies between reciprocal and step to pattern Has following equipment at home: Quad cane small base  PLOF: Independent  PATIENT GOALS: improved safety  OBJECTIVE:  Note: Objective measures were completed at Evaluation unless otherwise noted.  COGNITION: Overall cognitive status: Within functional limits for tasks assessed   SENSATION: Patient reports intermittent tingling in his legs when he lays down at night.   COORDINATION: WFL   EDEMA:  No edema observed  MUSCLE TONE:  WFL  POSTURE: forward head, decreased lumbar lordosis, and flexed trunk   LOWER EXTREMITY ROM: WFL for activities assessed  LOWER EXTREMITY MMT:    MMT Right Eval Left Eval  Hip flexion 4/5 4-/5  Hip extension    Hip abduction    Hip adduction    Hip internal rotation    Hip external rotation    Knee flexion 4/5 4/5  Knee extension 4+/5 4+/5  Ankle dorsiflexion 4/5 4/5  Ankle plantarflexion    Ankle inversion    Ankle eversion    (Blank rows = not tested)  TRANSFERS: Assistive device utilized: Counselling psychologist  Sit to stand: Complete Independence Stand to sit: Complete Independence  GAIT: Gait pattern: step through pattern, decreased stride length, poor foot clearance- Right, and poor foot clearance- Left Assistive device utilized: Quad cane small base and None Level of assistance: SBA Comments: close supervision was required when ambulating with his cane due to inconsistent usage and kicking his cane with his right foot  FUNCTIONAL TESTS:  5 times sit to stand: 10.28 seconds without UE support Timed up and go (TUG): 12.20 seconds without an assistive device  TODAY'S TREATMENT:                                                                                                                              DATE:                  09/07/23                   EXERCISE LOG  Exercise Repetitions and Resistance Comments  Nustep  Lvl 3 x 15 mins   Heel/Toe Raises 25 reps each   Side-Stepping Airex x 3 mins   Tandem Gait Airex x 3 mins   LAQs 2# x 25 reps bil  Seated Marches 2# x 25 reps bil   Seated Hip Abduction Red x 3 mins   Seated Hip Adduction 3 mins    Blank cell = exercise not performed today   PATIENT EDUCATION: Education details: plan of care, prognosis, safety, healing, and goals for therapy Person educated: Patient and Spouse Education method: Explanation Education comprehension: verbalized understanding  HOME EXERCISE PROGRAM:   GOALS: Goals  reviewed with patient? Yes  LONG TERM GOALS: Target date: 09/20/23  Patient will be independent with his HEP.  Baseline:  Goal status: INITIAL  2.  Patient will be able to demonstrate proper cane utilization while walking to reduce his risk of falling.  Baseline:  Goal status: INITIAL  3.  Patient will be able to safely ambulate with minimal to no significant gait deviations.  Baseline:  Goal status: INITIAL  ASSESSMENT:  CLINICAL IMPRESSION:  Pt arrives for today's treatment session reporting 4/10 left knee pain.  Pt able to tolerate increased time or reps with previously performed exercises.  Pt introduced to side-stepping and tandem gait on Airex balance beam today.  Pt requiring intermittent BUE support for safety and support.  Pt reports having a bone scan on Tuesday for his left knee.  Pt denied any change in pain at completion of today's treatment session.  OBJECTIVE IMPAIRMENTS: Abnormal gait, decreased balance, and decreased strength.   ACTIVITY LIMITATIONS: stairs and locomotion level  PARTICIPATION LIMITATIONS: yard work  PERSONAL FACTORS: Age, Time since onset of injury/illness/exacerbation, and 3+ comorbidities: Hypertension, osteoarthritis, history of a CVA, asthma, history of cancer, and chronic low back pain   are also affecting patient's functional outcome.   REHAB POTENTIAL: Good  CLINICAL DECISION MAKING: Stable/uncomplicated  EVALUATION COMPLEXITY: Low  PLAN:  PT FREQUENCY: 1-2x/week  PT DURATION: 4 weeks  PLANNED INTERVENTIONS: 97164- PT Re-evaluation, 97110-Therapeutic exercises, 97530- Therapeutic activity, 97112- Neuromuscular re-education, 97535- Self Care, 19147- Manual therapy, [phone]- Gait training, Patient/Family education, Balance training, and Stair training  PLAN FOR NEXT SESSION: Nustep, gait training, lower extremity strengthening, and balance interventions   Newman Pies, PTA 09/07/2023, 2:36 PM

## 2023-09-11 ENCOUNTER — Ambulatory Visit: Payer: Medicare HMO

## 2023-09-11 DIAGNOSIS — Z9181 History of falling: Secondary | ICD-10-CM

## 2023-09-11 DIAGNOSIS — M6281 Muscle weakness (generalized): Secondary | ICD-10-CM

## 2023-09-11 NOTE — Therapy (Signed)
OUTPATIENT PHYSICAL THERAPY NEURO TREATMENT   Patient Name: Joel Kim MRN: [id number] DOB:08/07/1946, 77 y.o., male Today's Date: 09/11/2023  REFERRING PROVIDER: Julien Girt, PA-C   END OF SESSION:  PT End of Session - 09/11/23 1351     Visit Number 4    Number of Visits 6    Date for PT Re-Evaluation 10/27/23    PT Start Time 1345    Activity Tolerance Patient tolerated treatment well    Behavior During Therapy [facility] for tasks assessed/performed             Past Medical History:  Diagnosis Date   Asthma    Atypical nevus 01/26/2006   right side of body (slight)   Basal cell carcinoma 11/17/2015   left ear rim tx cx3 50fu   Cancer (HCC)    Hypertension    Hypothyroidism    Insomnia    Intractable migraine without aura    Melanoma (HCC) 10/10/2014   right ant. shoulder (exc)   Neuromuscular disorder (HCC)    feet   Pancreatitis    Pre-diabetes    Prostate CA (HCC)    Spondylosis of lumbar spine    Stroke (HCC) 2021   TIA x 2 some memory loss.   Thyroid disease    Past Surgical History:  Procedure Laterality Date   BACK SURGERY  2013   L4-L5 and a redo in 2014 after a fall   CHOLECYSTECTOMY  1992   FOOT SURGERY Left 1999   PROSTATECTOMY  2008   ROTATOR CUFF REPAIR Left 2021   SPINAL CORD STIMULATOR IMPLANT  2020   TOTAL KNEE ARTHROPLASTY Right 2005   TOTAL KNEE ARTHROPLASTY Left 01/19/2022   Procedure: TOTAL KNEE ARTHROPLASTY;  Surgeon: Eugenia Mcalpine, MD;  Location: [facility];  Service: Orthopedics;  Laterality: Left;  adductor canal 120   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   Hypokalemia 01/25/2022   Acute anemia 01/25/2022   Acute metabolic encephalopathy 01/25/2022   Osteoarthritis of left knee 01/19/2022    Atypical chest pain 08/15/2018   Hyperlipidemia 08/15/2018   Family history of heart disease 08/15/2018   Hypertension 05/31/2018   Hypothyroidism 05/31/2018   Acute pancreatitis 05/31/2018   Mild renal insufficiency 05/31/2018    ONSET DATE: years ago  REFERRING DIAG: Unspecified abnormalities of gait and mobility; Other abnormalities of gait and mobility   THERAPY DIAG:  History of falling  Muscle weakness (generalized)  Rationale for Evaluation and Treatment: Rehabilitation  SUBJECTIVE:  SUBJECTIVE STATEMENT: Pt report 5/10 left knee pain today.  Pt has CT scan and bone scan scheduled for this week.  Pt accompanied by: self  PERTINENT HISTORY: Hypertension, osteoarthritis, history of a CVA, asthma, history of cancer, and chronic low back pain   PAIN:  Are you having pain? Yes: NPRS scale: 5/10 Pain location: left knee  Pain description: aching and intermittent weakness  PRECAUTIONS: Fall  RED FLAGS: None   WEIGHT BEARING RESTRICTIONS: No  FALLS: Has patient fallen in last 6 months? Yes. Number of falls 2  LIVING ENVIRONMENT: Lives with: lives with their family Lives in: House/apartment Stairs: Yes: Internal: 13 steps; on left going up; he avoids these steps if possible, varies between reciprocal and step to pattern Has following equipment at home: Quad cane small base  PLOF: Independent  PATIENT GOALS: improved safety  OBJECTIVE:  Note: Objective measures were completed at Evaluation unless otherwise noted.  COGNITION: Overall cognitive status: Within functional limits for tasks assessed   SENSATION: Patient reports intermittent tingling in his legs when he lays down at night.   COORDINATION: WFL   EDEMA:  No edema observed  MUSCLE TONE: WFL  POSTURE: forward  head, decreased lumbar lordosis, and flexed trunk   LOWER EXTREMITY ROM: WFL for activities assessed  LOWER EXTREMITY MMT:    MMT Right Eval Left Eval  Hip flexion 4/5 4-/5  Hip extension    Hip abduction    Hip adduction    Hip internal rotation    Hip external rotation    Knee flexion 4/5 4/5  Knee extension 4+/5 4+/5  Ankle dorsiflexion 4/5 4/5  Ankle plantarflexion    Ankle inversion    Ankle eversion    (Blank rows = not tested)  TRANSFERS: Assistive device utilized: Counselling psychologist  Sit to stand: Complete Independence Stand to sit: Complete Independence  GAIT: Gait pattern: step through pattern, decreased stride length, poor foot clearance- Right, and poor foot clearance- Left Assistive device utilized: Quad cane small base and None Level of assistance: SBA Comments: close supervision was required when ambulating with his cane due to inconsistent usage and kicking his cane with his right foot  FUNCTIONAL TESTS:  5 times sit to stand: 10.28 seconds without UE support Timed up and go (TUG): 12.20 seconds without an assistive device  TODAY'S TREATMENT:                                                                                                                              DATE:                  09/11/23                   EXERCISE LOG  Exercise Repetitions and Resistance Comments  Nustep  Lvl 6 x 15 mins   Rockerboard 5 mins   Heel/Toe Raises 25 reps each   Side-Stepping Airex x 3.5 mins  Tandem Gait Airex x 3.5 mins   LAQs 2# x 30 reps bil   Seated Marches 2# x 30 reps bil   Seated Hip Abduction Red x 3.5 mins   Seated Hip Adduction 3.5 mins    Blank cell = exercise not performed today   PATIENT EDUCATION: Education details: plan of care, prognosis, safety, healing, and goals for therapy Person educated: Patient and Spouse Education method: Explanation Education comprehension: verbalized understanding  HOME EXERCISE PROGRAM:   GOALS: Goals  reviewed with patient? Yes  LONG TERM GOALS: Target date: 09/20/23  Patient will be independent with his HEP.  Baseline:  Goal status: IN PROGRESS  2.  Patient will be able to demonstrate proper cane utilization while walking to reduce his risk of falling.  Baseline:  Goal status: IN PROGRESS  3.  Patient will be able to safely ambulate with minimal to no significant gait deviations.  Baseline:  Goal status: IN PROGRESS  ASSESSMENT:  CLINICAL IMPRESSION:  Pt arrives for today's treatment session reporting 5/10 left knee pain.  Pt has CT scan and bone scan scheduled for later this week.  Pt able to tolerate increased time or reps with several exercises today without reporting pain or discomfort.  Pt encouraged to continue performance of HEP as previously instructed.  Pt denied any change in pain at completion of today's treatment session.  OBJECTIVE IMPAIRMENTS: Abnormal gait, decreased balance, and decreased strength.   ACTIVITY LIMITATIONS: stairs and locomotion level  PARTICIPATION LIMITATIONS: yard work  PERSONAL FACTORS: Age, Time since onset of injury/illness/exacerbation, and 3+ comorbidities: Hypertension, osteoarthritis, history of a CVA, asthma, history of cancer, and chronic low back pain   are also affecting patient's functional outcome.   REHAB POTENTIAL: Good  CLINICAL DECISION MAKING: Stable/uncomplicated  EVALUATION COMPLEXITY: Low  PLAN:  PT FREQUENCY: 1-2x/week  PT DURATION: 4 weeks  PLANNED INTERVENTIONS: 97164- PT Re-evaluation, 97110-Therapeutic exercises, 97530- Therapeutic activity, 97112- Neuromuscular re-education, 97535- Self Care, 16109- Manual therapy, [phone]- Gait training, Patient/Family education, Balance training, and Stair training  PLAN FOR NEXT SESSION: Nustep, gait training, lower extremity strengthening, and balance interventions   Newman Pies, PTA 09/11/2023, 2:42 PM

## 2023-09-12 ENCOUNTER — Encounter (HOSPITAL_COMMUNITY)
Admission: RE | Admit: 2023-09-12 | Discharge: 2023-09-12 | Disposition: A | Discharge status: Home / Self Care | Payer: Medicare HMO | Source: Ambulatory Visit | Attending: Orthopedic Surgery | Admitting: Orthopedic Surgery

## 2023-09-12 DIAGNOSIS — T8484XD Pain due to internal orthopedic prosthetic devices, implants and grafts, subsequent encounter: Secondary | ICD-10-CM | POA: Diagnosis present

## 2023-09-12 MED ORDER — TECHNETIUM TC 99M MEDRONATE IV KIT
20.0000 | PACK | Freq: Once | INTRAVENOUS | Status: AC | PRN
  Administered 2023-09-12: 20 via INTRAVENOUS

## 2023-09-13 ENCOUNTER — Ambulatory Visit: Payer: Medicare HMO

## 2023-09-13 DIAGNOSIS — M6281 Muscle weakness (generalized): Secondary | ICD-10-CM

## 2023-09-13 DIAGNOSIS — Z9181 History of falling: Secondary | ICD-10-CM

## 2023-09-13 NOTE — Therapy (Signed)
OUTPATIENT PHYSICAL THERAPY NEURO TREATMENT   Patient Name: Joel Kim MRN: [id number] DOB:[birth year], 77 y.o., male Today's Date: 09/13/2023  REFERRING PROVIDER: Julien Girt, PA-C   END OF SESSION:  PT End of Session - 09/13/23 1316     Visit Number 5    Number of Visits 6    Date for PT Re-Evaluation 10/27/23    PT Start Time 1308   Patient arrived late to his appointment.   PT Stop Time 1344    PT Time Calculation (min) 36 min    Activity Tolerance Patient tolerated treatment well    Behavior During Therapy [facility] for tasks assessed/performed              Past Medical History:  Diagnosis Date   Asthma    Atypical nevus 01/26/2006   right side of body (slight)   Basal cell carcinoma 11/17/2015   left ear rim tx cx3 52fu   Cancer (HCC)    Hypertension    Hypothyroidism    Insomnia    Intractable migraine without aura    Melanoma (HCC) 10/10/2014   right ant. shoulder (exc)   Neuromuscular disorder (HCC)    feet   Pancreatitis    Pre-diabetes    Prostate CA (HCC)    Spondylosis of lumbar spine    Stroke (HCC) 2021   TIA x 2 some memory loss.   Thyroid disease    Past Surgical History:  Procedure Laterality Date   BACK SURGERY  2013   L4-L5 and a redo in 2014 after a fall   CHOLECYSTECTOMY  1992   FOOT SURGERY Left 1999   PROSTATECTOMY  2008   ROTATOR CUFF REPAIR Left 2021   SPINAL CORD STIMULATOR IMPLANT  2020   TOTAL KNEE ARTHROPLASTY Right 2005   TOTAL KNEE ARTHROPLASTY Left 01/19/2022   Procedure: TOTAL KNEE ARTHROPLASTY;  Surgeon: Eugenia Mcalpine, MD;  Location: [facility];  Service: Orthopedics;  Laterality: Left;  adductor canal 120   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   Hypokalemia 01/25/2022   Acute  anemia 01/25/2022   Acute metabolic encephalopathy 01/25/2022   Osteoarthritis of left knee 01/19/2022   Atypical chest pain 08/15/2018   Hyperlipidemia 08/15/2018   Family history of heart disease 08/15/2018   Hypertension 05/31/2018   Hypothyroidism 05/31/2018   Acute pancreatitis 05/31/2018   Mild renal insufficiency 05/31/2018    ONSET DATE: years ago  REFERRING DIAG: Unspecified abnormalities of gait and mobility; Other abnormalities of gait and mobility   THERAPY DIAG:  History of falling  Muscle weakness (generalized)  Rationale for Evaluation and Treatment: Rehabilitation  SUBJECTIVE:  SUBJECTIVE STATEMENT: Patient reports that his right knee is hurting some today. He would like to be discharged today.   Pt accompanied by: self  PERTINENT HISTORY: Hypertension, osteoarthritis, history of a CVA, asthma, history of cancer, and chronic low back pain   PAIN:  Are you having pain? Yes: NPRS scale: 5/10 Pain location: left knee  Pain description: aching and intermittent weakness  PRECAUTIONS: Fall  RED FLAGS: None   WEIGHT BEARING RESTRICTIONS: No  FALLS: Has patient fallen in last 6 months? Yes. Number of falls 2  LIVING ENVIRONMENT: Lives with: lives with their family Lives in: House/apartment Stairs: Yes: Internal: 13 steps; on left going up; he avoids these steps if possible, varies between reciprocal and step to pattern Has following equipment at home: Quad cane small base  PLOF: Independent  PATIENT GOALS: improved safety  OBJECTIVE:  Note: Objective measures were completed at Evaluation unless otherwise noted.  COGNITION: Overall cognitive status: Within functional limits for tasks assessed   SENSATION: Patient reports intermittent tingling in his legs when he  lays down at night.   COORDINATION: WFL   EDEMA:  No edema observed  MUSCLE TONE: WFL  POSTURE: forward head, decreased lumbar lordosis, and flexed trunk   LOWER EXTREMITY ROM: WFL for activities assessed  LOWER EXTREMITY MMT:    MMT Right Eval Left Eval  Hip flexion 4/5 4-/5  Hip extension    Hip abduction    Hip adduction    Hip internal rotation    Hip external rotation    Knee flexion 4/5 4/5  Knee extension 4+/5 4+/5  Ankle dorsiflexion 4/5 4/5  Ankle plantarflexion    Ankle inversion    Ankle eversion    (Blank rows = not tested)  TRANSFERS: Assistive device utilized: Counselling psychologist  Sit to stand: Complete Independence Stand to sit: Complete Independence  GAIT: Gait pattern: step through pattern, decreased stride length, poor foot clearance- Right, and poor foot clearance- Left Assistive device utilized: Quad cane small base and None Level of assistance: SBA Comments: close supervision was required when ambulating with his cane due to inconsistent usage and kicking his cane with his right foot  FUNCTIONAL TESTS:  5 times sit to stand: 10.28 seconds without UE support Timed up and go (TUG): 12.20 seconds without an assistive device  TODAY'S TREATMENT:                                                                                                                              DATE:                                    09/13/23  EXERCISE LOG  Exercise Repetitions and Resistance Comments  Nustep L5 x 17 minutes   Rocker board  5 minutes   Side stepping on foam  3 minutes BUE support   Tandem walking  on foam  3 minutes  BUE support  LAQ 3# x 3 minutes     Blank cell = exercise not performed today                  09/11/23                   EXERCISE LOG  Exercise Repetitions and Resistance Comments  Nustep  Lvl 6 x 15 mins   Rockerboard 5 mins   Heel/Toe Raises 25 reps each   Side-Stepping Airex x 3.5 mins   Tandem Gait Airex x 3.5 mins   LAQs  2# x 30 reps bil   Seated Marches 2# x 30 reps bil   Seated Hip Abduction Red x 3.5 mins   Seated Hip Adduction 3.5 mins    Blank cell = exercise not performed today   PATIENT EDUCATION: Education details: plan of care, prognosis, safety, healing, and goals for therapy Person educated: Patient and Spouse Education method: Explanation Education comprehension: verbalized understanding  HOME EXERCISE PROGRAM:   GOALS: Goals reviewed with patient? Yes  LONG TERM GOALS: Target date: 09/20/23  Patient will be independent with his HEP.  Baseline:  Goal status: MET  2.  Patient will be able to demonstrate proper cane utilization while walking to reduce his risk of falling.  Baseline:  Goal status: MET  3.  Patient will be able to safely ambulate with minimal to no significant gait deviations.  Baseline:  Goal status: MET  ASSESSMENT:  CLINICAL IMPRESSION:  Patient has made good progress with skilled physical therapy as evidenced by his improved functional mobility and progress toward his goals. He was able to safely ambulate without the need for an assistive device. His familiar exercise were reviewed and he experienced no limitation with these interventions. He reported feeling comfortable being discharged at this time with his HEP.   OBJECTIVE IMPAIRMENTS: Abnormal gait, decreased balance, and decreased strength.   ACTIVITY LIMITATIONS: stairs and locomotion level  PARTICIPATION LIMITATIONS: yard work  PERSONAL FACTORS: Age, Time since onset of injury/illness/exacerbation, and 3+ comorbidities: Hypertension, osteoarthritis, history of a CVA, asthma, history of cancer, and chronic low back pain   are also affecting patient's functional outcome.   REHAB POTENTIAL: Good  CLINICAL DECISION MAKING: Stable/uncomplicated  EVALUATION COMPLEXITY: Low  PLAN:  PT FREQUENCY: 1-2x/week  PT DURATION: 4 weeks  PLANNED INTERVENTIONS: 97164- PT Re-evaluation, 97110-Therapeutic  exercises, 97530- Therapeutic activity, 97112- Neuromuscular re-education, 97535- Self Care, 16109- Manual therapy, [phone]- Gait training, Patient/Family education, Balance training, and Stair training  PLAN FOR NEXT SESSION: Nustep, gait training, lower extremity strengthening, and balance interventions   Granville Lewis, PT 09/13/2023, 3:43 PM

## 2023-10-06 ENCOUNTER — Telehealth (HOSPITAL_COMMUNITY): Payer: Self-pay | Admitting: *Deleted

## 2023-10-06 NOTE — Telephone Encounter (Signed)
 Received fax from Dr Samson Frederic requesting IVC filter placement by Dr Randie Heinz for upcoming left TKA.  Will notify Lawanna Kobus and scan fax into media tab.

## 2023-10-09 ENCOUNTER — Other Ambulatory Visit: Payer: Self-pay

## 2023-10-09 DIAGNOSIS — Z86718 Personal history of other venous thrombosis and embolism: Secondary | ICD-10-CM

## 2023-11-25 ENCOUNTER — Ambulatory Visit: Payer: Self-pay | Admitting: Student

## 2023-12-05 NOTE — Progress Notes (Signed)
 Anesthesia Review:  PCP: Kirstin shepperson LOV 11/24/23  Cardiologist : Gladis Riffle Manam LOV 10/05/23   PPM/ ICD: Device Orders: Rep Notified:  Chest x-ray : 04/23/23- 1 view  CT Chest- 04/23/23  EKG : 04/24/23  Echo : 2019  Stress test: 2019  Cardiac Cath :   Activity level:  Sleep Study/ CPAP : Fasting Blood Sugar :      / Checks Blood Sugar -- times a day:     Pre Diabetes  Mounjaro- last dose on   Blood Thinner/ Instructions /Last Dose: ASA / Instructions/ Last Dose :    12/11/23- IVC Filter Insertion- ER Randie Heinz

## 2023-12-05 NOTE — Patient Instructions (Signed)
 SURGICAL WAITING ROOM VISITATION  Patients having surgery or a procedure may have no more than 2 support people in the waiting area - these visitors may rotate.    Children under the age of 67 must have an adult with them who is not the patient.  Due to an increase in RSV and influenza rates and associated hospitalizations, children ages 31 and under may not visit patients in [facility] hospitals.  Visitors with respiratory illnesses are discouraged from visiting and should remain at home.  If the patient needs to stay at the hospital during part of their recovery, the visitor guidelines for inpatient rooms apply. Pre-op nurse will coordinate an appropriate time for 1 support person to accompany patient in pre-op.  This support person may not rotate.    Please refer to the [facility] website for the visitor guidelines for Inpatients (after your surgery is over and you are in a regular room).       Your procedure is scheduled on:  12/20/2023    Report to [facility] Main Entrance    Report to admitting at  [phone]   Call this number if you have problems the morning of surgery [phone]   Do not eat food :After Midnight.   After Midnight you may have the following liquids until __ 0745____ AM  DAY OF SURGERY  Water Non-Citrus Juices (without pulp, NO RED-Apple, White grape, White cranberry) Black Coffee (NO MILK/CREAM OR CREAMERS, sugar ok)  Clear Tea (NO MILK/CREAM OR CREAMERS, sugar ok) regular and decaf                             Plain Jell-O (NO RED)                                           Fruit ices (not with fruit pulp, NO RED)                                     Popsicles (NO RED)                                                               Sports drinks like Gatorade (NO RED)                    The day of surgery:  Drink ONE (1) Pre-Surgery Clear Ensure or G2 at 0745 AM   ( have completed by ) the morning of surgery. Drink in one sitting. Do not sip.   This drink was given to you during your hospital  pre-op appointment visit. Nothing else to drink after completing the  Pre-Surgery Clear Ensure or G2.          If you have questions, please contact your surgeon's office.       Oral Hygiene is also important to reduce your risk of infection.  Remember - BRUSH YOUR TEETH THE MORNING OF SURGERY WITH YOUR REGULAR TOOTHPASTE  DENTURES WILL BE REMOVED PRIOR TO SURGERY PLEASE DO NOT APPLY "Poly grip" OR ADHESIVES!!!   Do NOT smoke after Midnight   Stop all vitamins and herbal supplements 7 days before surgery.   Take these medicines the morning of surgery with A SIP OF WATER:  inhalers as usual and bring, synthroid, protonix, ranexa             Mounjaro- Last dose on   DO NOT TAKE ANY ORAL DIABETIC MEDICATIONS DAY OF YOUR SURGERY  Bring CPAP mask and tubing day of surgery.                              You may not have any metal on your body including hair pins, jewelry, and body piercing             Do not wear make-up, lotions, powders, perfumes/cologne, or deodorant  Do not wear nail polish including gel and S&S, artificial/acrylic nails, or any other type of covering on natural nails including finger and toenails. If you have artificial nails, gel coating, etc. that needs to be removed by a nail salon please have this removed prior to surgery or surgery may need to be canceled/ delayed if the surgeon/ anesthesia feels like they are unable to be safely monitored.   Do not shave  48 hours prior to surgery.               Men may shave face and neck.   Do not bring valuables to the hospital. [facility] IS NOT             RESPONSIBLE   FOR VALUABLES.   Contacts, glasses, dentures or bridgework may not be worn into surgery.   Bring small overnight bag day of surgery.   DO NOT BRING YOUR HOME MEDICATIONS TO THE HOSPITAL. PHARMACY WILL DISPENSE MEDICATIONS LISTED ON YOUR MEDICATION LIST TO YOU  DURING YOUR ADMISSION IN THE HOSPITAL!    Patients discharged on the day of surgery will not be allowed to drive home.  Someone NEEDS to stay with you for the first 24 hours after anesthesia.   Special Instructions: Bring a copy of your healthcare power of attorney and living will documents the day of surgery if you haven't scanned them before.              Please read over the following fact sheets you were given: IF YOU HAVE QUESTIONS ABOUT YOUR PRE-OP INSTRUCTIONS PLEASE CALL [phone]   If you received a COVID test during your pre-op visit  it is requested that you wear a mask when out in public, stay away from anyone that may not be feeling well and notify your surgeon if you develop symptoms. If you test positive for Covid or have been in contact with anyone that has tested positive in the last 10 days please notify you surgeon.      Pre-operative 5 CHG Bath Instructions   You can play a key role in reducing the risk of infection after surgery. Your skin needs to be as free of germs as possible. You can reduce the number of germs on your skin by washing with CHG (chlorhexidine gluconate) soap before surgery. CHG is an antiseptic soap that kills germs and continues to kill germs even after washing.   DO NOT use if you have an allergy  to chlorhexidine/CHG or antibacterial soaps. If your skin becomes reddened or irritated, stop using the CHG and notify one of our RNs at [phone].   Please shower with the CHG soap starting 4 days before surgery using the following schedule:     Please keep in mind the following:  DO NOT shave, including legs and underarms, starting the day of your first shower.   You may shave your face at any point before/day of surgery.  Place clean sheets on your bed the day you start using CHG soap. Use a clean washcloth (not used since being washed) for each shower. DO NOT sleep with pets once you start using the CHG.   CHG Shower Instructions:  If you  choose to wash your hair and private area, wash first with your normal shampoo/soap.  After you use shampoo/soap, rinse your hair and body thoroughly to remove shampoo/soap residue.  Turn the water OFF and apply about 3 tablespoons (45 ml) of CHG soap to a CLEAN washcloth.  Apply CHG soap ONLY FROM YOUR NECK DOWN TO YOUR TOES (washing for 3-5 minutes)  DO NOT use CHG soap on face, private areas, open wounds, or sores.  Pay special attention to the area where your surgery is being performed.  If you are having back surgery, having someone wash your back for you may be helpful. Wait 2 minutes after CHG soap is applied, then you may rinse off the CHG soap.  Pat dry with a clean towel  Put on clean clothes/pajamas   If you choose to wear lotion, please use ONLY the CHG-compatible lotions on the back of this paper.     Additional instructions for the day of surgery: DO NOT APPLY any lotions, deodorants, cologne, or perfumes.   Put on clean/comfortable clothes.  Brush your teeth.  Ask your nurse before applying any prescription medications to the skin.      CHG Compatible Lotions   Aveeno Moisturizing lotion  Cetaphil Moisturizing Cream  Cetaphil Moisturizing Lotion  Clairol Herbal Essence Moisturizing Lotion, Dry Skin  Clairol Herbal Essence Moisturizing Lotion, Extra Dry Skin  Clairol Herbal Essence Moisturizing Lotion, Normal Skin  Curel Age Defying Therapeutic Moisturizing Lotion with Alpha Hydroxy  Curel Extreme Care Body Lotion  Curel Soothing Hands Moisturizing Hand Lotion  Curel Therapeutic Moisturizing Cream, Fragrance-Free  Curel Therapeutic Moisturizing Lotion, Fragrance-Free  Curel Therapeutic Moisturizing Lotion, Original Formula  Eucerin Daily Replenishing Lotion  Eucerin Dry Skin Therapy Plus Alpha Hydroxy Crme  Eucerin Dry Skin Therapy Plus Alpha Hydroxy Lotion  Eucerin Original Crme  Eucerin Original Lotion  Eucerin Plus Crme Eucerin Plus Lotion  Eucerin  TriLipid Replenishing Lotion  Keri Anti-Bacterial Hand Lotion  Keri Deep Conditioning Original Lotion Dry Skin Formula Softly Scented  Keri Deep Conditioning Original Lotion, Fragrance Free Sensitive Skin Formula  Keri Lotion Fast Absorbing Fragrance Free Sensitive Skin Formula  Keri Lotion Fast Absorbing Softly Scented Dry Skin Formula  Keri Original Lotion  Keri Skin Renewal Lotion Keri Silky Smooth Lotion  Keri Silky Smooth Sensitive Skin Lotion  Nivea Body Creamy Conditioning Oil  Nivea Body Extra Enriched Teacher, adult education Moisturizing Lotion Nivea Crme  Nivea Skin Firming Lotion  NutraDerm 30 Skin Lotion  NutraDerm Skin Lotion  NutraDerm Therapeutic Skin Cream  NutraDerm Therapeutic Skin Lotion  ProShield Protective Hand Cream  Provon moisturizing lotion

## 2023-12-07 ENCOUNTER — Encounter (HOSPITAL_COMMUNITY)
Admission: RE | Admit: 2023-12-07 | Discharge: 2023-12-07 | Disposition: A | Discharge status: Home / Self Care | Payer: Medicare HMO | Source: Ambulatory Visit | Attending: Orthopedic Surgery | Admitting: Orthopedic Surgery

## 2023-12-07 ENCOUNTER — Encounter (HOSPITAL_COMMUNITY): Payer: Self-pay

## 2023-12-07 ENCOUNTER — Other Ambulatory Visit: Payer: Self-pay

## 2023-12-07 VITALS — BP 180/96 | HR 69 | Temp 98.5°F | Resp 16 | Ht 65.0 in

## 2023-12-07 DIAGNOSIS — Z01818 Encounter for other preprocedural examination: Secondary | ICD-10-CM | POA: Diagnosis present

## 2023-12-07 HISTORY — DX: Other specified postprocedural states: Z98.890

## 2023-12-07 HISTORY — DX: Cardiac murmur, unspecified: R01.1

## 2023-12-07 HISTORY — DX: Personal history of urinary calculi: Z87.442

## 2023-12-07 HISTORY — DX: Fibromyalgia: M79.7

## 2023-12-07 HISTORY — DX: Anemia, unspecified: D64.9

## 2023-12-07 HISTORY — DX: Sleep apnea, unspecified: G47.30

## 2023-12-07 HISTORY — DX: Nausea with vomiting, unspecified: R11.2

## 2023-12-07 HISTORY — DX: Gastro-esophageal reflux disease without esophagitis: K21.9

## 2023-12-07 LAB — CBC
HCT: 39.7 % (ref 39.0–52.0)
Hemoglobin: 13.4 g/dL (ref 13.0–17.0)
MCH: 31.7 pg (ref 26.0–34.0)
MCHC: 33.8 g/dL (ref 30.0–36.0)
MCV: 93.9 fL (ref 80.0–100.0)
Platelets: 199 10*3/uL (ref 150–400)
RBC: 4.23 MIL/uL (ref 4.22–5.81)
RDW: 13.2 % (ref 11.5–15.5)
WBC: 8.3 10*3/uL (ref 4.0–10.5)
nRBC: 0 % (ref 0.0–0.2)

## 2023-12-07 LAB — BASIC METABOLIC PANEL
Anion gap: 7 (ref 5–15)
BUN: 14 mg/dL (ref 8–23)
CO2: 29 mmol/L (ref 22–32)
Calcium: 9.3 mg/dL (ref 8.9–10.3)
Chloride: 103 mmol/L (ref 98–111)
Creatinine, Ser: 1.08 mg/dL (ref 0.61–1.24)
GFR, Estimated: >60 mL/min (ref >60)
Glucose, Bld: 88 mg/dL (ref 70–99)
Potassium: 4.1 mmol/L (ref 3.5–5.1)
Sodium: 139 mmol/L (ref 135–145)

## 2023-12-07 LAB — SURGICAL PCR SCREEN
MRSA, PCR: NEGATIVE
Staphylococcus aureus: NEGATIVE

## 2023-12-08 ENCOUNTER — Telehealth: Payer: Self-pay

## 2023-12-08 ENCOUNTER — Encounter (HOSPITAL_COMMUNITY): Payer: Self-pay | Admitting: Physician Assistant

## 2023-12-08 NOTE — Telephone Encounter (Signed)
 Patient's spouse called to report that patient has been at [facility] ED today with hypertensive crisis.  She requests cancellation of 3/17 IVF Filter Placement with Dr. Randie Heinz.  Dr. Randie Heinz made aware.

## 2023-12-11 ENCOUNTER — Encounter (HOSPITAL_COMMUNITY): Admission: RE | Payer: Self-pay | Source: Home / Self Care

## 2023-12-11 ENCOUNTER — Ambulatory Visit (HOSPITAL_COMMUNITY)
Admission: RE | Admit: 2023-12-11 | Payer: PRIVATE HEALTH INSURANCE | Source: Home / Self Care | Admitting: Vascular Surgery

## 2023-12-11 SURGERY — IVC FILTER INSERTION
Anesthesia: LOCAL

## 2023-12-18 ENCOUNTER — Encounter (HOSPITAL_COMMUNITY): Payer: Self-pay

## 2023-12-18 ENCOUNTER — Ambulatory Visit (HOSPITAL_COMMUNITY): Admit: 2023-12-18 | Payer: PRIVATE HEALTH INSURANCE | Admitting: Vascular Surgery

## 2023-12-18 SURGERY — IVC FILTER INSERTION
Anesthesia: LOCAL

## 2023-12-20 ENCOUNTER — Encounter (HOSPITAL_COMMUNITY): Admission: RE | Payer: Self-pay | Source: Ambulatory Visit

## 2023-12-20 ENCOUNTER — Inpatient Hospital Stay (HOSPITAL_COMMUNITY)
Admission: RE | Admit: 2023-12-20 | Payer: PRIVATE HEALTH INSURANCE | Source: Ambulatory Visit | Admitting: Orthopedic Surgery

## 2023-12-20 SURGERY — TOTAL KNEE REVISION
Anesthesia: Spinal | Site: Knee | Laterality: Left

## 2023-12-25 ENCOUNTER — Ambulatory Visit: Payer: PRIVATE HEALTH INSURANCE | Admitting: Physical Therapy

## 2023-12-29 ENCOUNTER — Emergency Department (HOSPITAL_COMMUNITY)
Admission: EM | Admit: 2023-12-29 | Discharge: 2023-12-29 | Disposition: A | Discharge status: Home / Self Care | Attending: Emergency Medicine | Admitting: Emergency Medicine

## 2023-12-29 ENCOUNTER — Encounter (HOSPITAL_COMMUNITY): Payer: Self-pay

## 2023-12-29 ENCOUNTER — Emergency Department (HOSPITAL_COMMUNITY)

## 2023-12-29 ENCOUNTER — Other Ambulatory Visit: Payer: Self-pay

## 2023-12-29 DIAGNOSIS — N390 Urinary tract infection, site not specified: Secondary | ICD-10-CM | POA: Diagnosis present

## 2023-12-29 DIAGNOSIS — Z7982 Long term (current) use of aspirin: Secondary | ICD-10-CM | POA: Insufficient documentation

## 2023-12-29 DIAGNOSIS — R5383 Other fatigue: Secondary | ICD-10-CM | POA: Insufficient documentation

## 2023-12-29 DIAGNOSIS — R059 Cough, unspecified: Secondary | ICD-10-CM | POA: Insufficient documentation

## 2023-12-29 DIAGNOSIS — E860 Dehydration: Secondary | ICD-10-CM | POA: Diagnosis not present

## 2023-12-29 DIAGNOSIS — Z91040 Latex allergy status: Secondary | ICD-10-CM | POA: Insufficient documentation

## 2023-12-29 LAB — CBC WITH DIFFERENTIAL/PLATELET
Abs Immature Granulocytes: 0.01 10*3/uL (ref 0.00–0.07)
Basophils Absolute: 0.1 10*3/uL (ref 0.0–0.1)
Basophils Relative: 1 %
Eosinophils Absolute: 0.4 10*3/uL (ref 0.0–0.5)
Eosinophils Relative: 5 %
HCT: 40.7 % (ref 39.0–52.0)
Hemoglobin: 14.2 g/dL (ref 13.0–17.0)
Immature Granulocytes: 0 %
Lymphocytes Relative: 28 %
Lymphs Abs: 2.6 10*3/uL (ref 0.7–4.0)
MCH: 31.6 pg (ref 26.0–34.0)
MCHC: 34.9 g/dL (ref 30.0–36.0)
MCV: 90.4 fL (ref 80.0–100.0)
Monocytes Absolute: 0.9 10*3/uL (ref 0.1–1.0)
Monocytes Relative: 10 %
Neutro Abs: 5.3 10*3/uL (ref 1.7–7.7)
Neutrophils Relative %: 56 %
Platelets: 230 10*3/uL (ref 150–400)
RBC: 4.5 MIL/uL (ref 4.22–5.81)
RDW: 12.5 % (ref 11.5–15.5)
WBC: 9.3 10*3/uL (ref 4.0–10.5)
nRBC: 0 % (ref 0.0–0.2)

## 2023-12-29 LAB — URINALYSIS, W/ REFLEX TO CULTURE (INFECTION SUSPECTED)
Bacteria, UA: NONE SEEN
Bilirubin Urine: NEGATIVE
Glucose, UA: NEGATIVE mg/dL
Hgb urine dipstick: NEGATIVE
Ketones, ur: NEGATIVE mg/dL
Leukocytes,Ua: NEGATIVE
Nitrite: NEGATIVE
Protein, ur: 30 mg/dL — AB
Specific Gravity, Urine: 1.01 (ref 1.005–1.030)
pH: 6 (ref 5.0–8.0)

## 2023-12-29 LAB — COMPREHENSIVE METABOLIC PANEL WITH GFR
ALT: 19 U/L (ref 0–44)
AST: 24 U/L (ref 15–41)
Albumin: 3.9 g/dL (ref 3.5–5.0)
Alkaline Phosphatase: 94 U/L (ref 38–126)
Anion gap: 10 (ref 5–15)
BUN: 21 mg/dL (ref 8–23)
CO2: 28 mmol/L (ref 22–32)
Calcium: 9 mg/dL (ref 8.9–10.3)
Chloride: 93 mmol/L — ABNORMAL LOW (ref 98–111)
Creatinine, Ser: 1.34 mg/dL — ABNORMAL HIGH (ref 0.61–1.24)
GFR, Estimated: 55 mL/min — ABNORMAL LOW (ref >60)
Glucose, Bld: 110 mg/dL — ABNORMAL HIGH (ref 70–99)
Potassium: 3.5 mmol/L (ref 3.5–5.1)
Sodium: 131 mmol/L — ABNORMAL LOW (ref 135–145)
Total Bilirubin: 1.3 mg/dL — ABNORMAL HIGH (ref 0.0–1.2)
Total Protein: 7.1 g/dL (ref 6.5–8.1)

## 2023-12-29 LAB — I-STAT CG4 LACTIC ACID, ED: Lactic Acid, Venous: 1.3 mmol/L (ref 0.5–1.9)

## 2023-12-29 MED ORDER — SODIUM CHLORIDE 0.9 % IV BOLUS
500.0000 mL | Freq: Once | INTRAVENOUS | Status: AC
  Administered 2023-12-29: 500 mL via INTRAVENOUS

## 2023-12-29 NOTE — ED Triage Notes (Signed)
 Diagnosed with UTI yesterday and placed on Bactrim. Pt is having decreased urinary output. Pt is on lasix. Wife reports pt has been acting confused and sleeping more than usual.

## 2023-12-29 NOTE — ED Provider Notes (Signed)
 [place] EMERGENCY DEPARTMENT AT [facility] Provider Note   CSN: [id number] Arrival date & time: 12/29/23  4696     History  Chief Complaint  Patient presents with   Fatigue    Joel Kim is a 78 y.o. male.  Patient is here with his wife.  She provides most of patient's history.  Patient has a past medical history of urinary tract infections.  Patient was seen by his primary care physician and started on Bactrim for UTI.  Patient's wife states patient has had decreased oral intake today.  She reports patient has seemed more fatigued.  She reports that patient had COVID and flu testing yesterday.  She reports patient has had a slight cough.  Patient denies any shortness of breath he denies any chest pain he denies any abdominal pain.  Pt has not had fever today.   The history is provided by the patient. No language interpreter was used.       Home Medications Prior to Admission medications   Medication Sig Start Date End Date Taking? Authorizing Provider  albuterol (PROVENTIL HFA;VENTOLIN HFA) 108 (90 Base) MCG/ACT inhaler Inhale 2 puffs into the lungs every 6 (six) hours as needed. Patient taking differently: Inhale 2 puffs into the lungs every 6 (six) hours as needed for wheezing or shortness of breath. 05/13/18   Rancour, Jeannett Senior, MD  Ascorbic Acid (VITAMIN C GUMMIE PO) Take 1 tablet by mouth in the morning.    [provider]  aspirin EC 81 MG tablet Take 81 mg by mouth at bedtime.    [provider]  cetirizine (ZYRTEC) 10 MG tablet Take 10 mg by mouth at bedtime.    [provider]  clotrimazole-betamethasone (LOTRISONE) cream Apply 1 Application topically 2 (two) times daily as needed (skin irritation.). 08/30/23   [provider]  furosemide (LASIX) 20 MG tablet Take 20 mg by mouth in the morning.    [provider]  levothyroxine (SYNTHROID) 100 MCG tablet Take 100 mcg by mouth daily before breakfast. 12/06/18    [provider]  lisinopril (ZESTRIL) 5 MG tablet Take 5 mg by mouth in the morning. Increased to 10 mg daily per wife phone call on 12/07/23 per PCP    [provider]  Multiple Vitamin (MULTIVITAMIN WITH MINERALS) TABS tablet Take 1 tablet by mouth in the morning.    [provider]  mupirocin ointment (BACTROBAN) 2 % Apply 1 Application topically 3 (three) times daily. 02/07/22   [provider]  Oxycodone HCl 10 MG TABS Take 10 mg by mouth every 6 (six) hours as needed (breakthrough pain.).    [provider]  pantoprazole (PROTONIX) 40 MG tablet Take 1 tablet (40 mg total) by mouth daily. 01/21/13   Ernestina Penna, MD  polyethylene glycol (MIRALAX / GLYCOLAX) 17 g packet Take 17 g by mouth daily as needed for mild constipation. 01/30/22   Rhetta Mura, MD  potassium chloride (KLOR-CON) 10 MEQ tablet Take 10 mEq by mouth in the morning.    [provider]  ranolazine (RANEXA) 500 MG 12 hr tablet Take 500 mg by mouth 2 (two) times daily.    [provider]  REPATHA SURECLICK 140 MG/ML SOAJ Inject 140 mg into the skin every 14 (fourteen) days. Wednesday    [provider]  tirzepatide Greggory Keen) 2.5 MG/0.5ML Pen Inject 2.5 mg into the skin every Friday.    [provider]  traZODone (DESYREL) 150 MG tablet Take  75 mg by mouth at bedtime.    [provider]  Vitamin D-Vitamin K (D3 + K2 PO) Take 1 tablet by mouth in the morning.    [provider]  XTAMPZA ER 9 MG C12A Take 9 mg by mouth 2 (two) times daily.    [provider]      Allergies    Tapentadol; Paroxetine hcl; Statins; Adhesive [tape]; Ciprofloxacin; Mannitol; Reclast [zoledronic acid]; Water for injection [water, sterile]; Flagyl [metronidazole]; and Latex    Review of Systems   Review of Systems  All other systems reviewed and are negative.   Physical Exam Updated Vital Signs BP (!) 149/90 (BP Location: Left Arm)    Pulse 79   Temp 98.7 F (37.1 C) (Oral)   Resp 17   Ht 5\' 5"  (1.651 m)   Wt 78 kg   SpO2 100%   BMI 28.62 kg/m  Physical Exam Vitals and nursing note reviewed.  Constitutional:      Appearance: He is well-developed.  HENT:     Head: Normocephalic.     Mouth/Throat:     Mouth: Mucous membranes are moist.  Eyes:     Pupils: Pupils are equal, round, and reactive to light.  Cardiovascular:     Rate and Rhythm: Normal rate.  Pulmonary:     Effort: Pulmonary effort is normal.  Abdominal:     General: Abdomen is flat. Bowel sounds are normal. There is no distension.  Musculoskeletal:        General: Normal range of motion.     Cervical back: Normal range of motion.  Skin:    General: Skin is warm.  Neurological:     General: No focal deficit present.     Mental Status: He is alert and oriented to person, place, and time.  Psychiatric:        Mood and Affect: Mood normal.     ED Results / Procedures / Treatments   Labs (all labs ordered are listed, but only abnormal results are displayed) Labs Reviewed  COMPREHENSIVE METABOLIC PANEL WITH GFR - Abnormal; Notable for the following components:      Result Value   Sodium 131 (*)    Chloride 93 (*)    Glucose, Bld 110 (*)    Creatinine, Ser 1.34 (*)    Total Bilirubin 1.3 (*)    GFR, Estimated 55 (*)    All other components within normal limits  URINALYSIS, W/ REFLEX TO CULTURE (INFECTION SUSPECTED) - Abnormal; Notable for the following components:   Protein, ur 30 (*)    All other components within normal limits  CBC WITH DIFFERENTIAL/PLATELET  I-STAT CG4 LACTIC ACID, ED  I-STAT CG4 LACTIC ACID, ED    EKG None  Radiology DG Chest Port 1 View Result Date: 12/29/2023 CLINICAL DATA:  Suspected UTI EXAM: PORTABLE CHEST 1 VIEW COMPARISON:  Chest x-ray 04/23/2023 FINDINGS: Thoracic spinal cord stimulator device again noted. The heart size and mediastinal contours are within normal limits. Both lungs are clear. The  visualized skeletal structures are unremarkable. IMPRESSION: No active disease. Electronically Signed   By: Darliss Cheney M.D.   On: 12/29/2023 20:27    Procedures Procedures    Medications Ordered in ED Medications  sodium chloride 0.9 % bolus 500 mL (500 mLs Intravenous New Bag/Given 12/29/23 2155)    ED Course/ Medical Decision Making/ A&P  Medical Decision Making Pt complains of weakness.  Pt diagnosed 2 days ago with a uti.  Pt is on bactrim.   Amount and/or Complexity of Data Reviewed External Data Reviewed: notes.    Details: Primary care notes reviewed.   Labs: ordered. Decision-making details documented in ED Course.    Details: Labs ordered reviewed and interpreted UA is negative except for 30 of protein.  Sodium is 131 creatinine is 1.34 Radiology: ordered.    Details: Chest  xray  no acute findings.   Risk Risk Details: Patient given oral fluids and IV fluids.  I discussed with patient and his wife the results.  I have advised to continue home Bactrim.  Patient does not have any sign of urosepsis at this time.  I have advised recheck with primary care physician on Monday.  Return to the emergency department if fever chills or worsening illness.           Final Clinical Impression(s) / ED Diagnoses Final diagnoses:  Dehydration  Other fatigue    Rx / DC Orders ED Discharge Orders     None      An After Visit Summary was printed and given to the patient.    Osie Cheeks 12/29/23 2322    Wynetta Fines, MD 12/31/23 0001

## 2023-12-29 NOTE — Discharge Instructions (Signed)
 Finish antibiotic.  Return if any problems.

## 2024-01-17 ENCOUNTER — Telehealth: Payer: Self-pay

## 2024-01-17 ENCOUNTER — Telehealth (HOSPITAL_COMMUNITY): Payer: Self-pay | Admitting: *Deleted

## 2024-01-17 NOTE — Telephone Encounter (Signed)
 Received fax from [facility] requesting IVC filter placement. Previously cancelled with [facility] 12/18/2023.  Will notify Mylinda Asa and scan fax into media.

## 2024-01-17 NOTE — Telephone Encounter (Signed)
 Received fax from [facility] stating "OK to proceed with IVC filter and knee replacement without further cardiac workup as long as no anginal symptoms."  Document to be scanned to chart.

## 2024-01-22 ENCOUNTER — Telehealth: Payer: Self-pay

## 2024-01-22 NOTE — Telephone Encounter (Signed)
 Received cardiac clearance from [facility].  This nurse faxed clearance notes to patient's PCP.

## 2024-01-22 NOTE — Telephone Encounter (Signed)
 Letter sent to inform patient of cardiology clearance and the need to see orthopedic.

## 2024-03-01 ENCOUNTER — Other Ambulatory Visit: Payer: Self-pay

## 2024-03-01 DIAGNOSIS — Z86718 Personal history of other venous thrombosis and embolism: Secondary | ICD-10-CM

## 2024-03-08 ENCOUNTER — Ambulatory Visit: Payer: Self-pay | Admitting: Student

## 2024-03-08 NOTE — Progress Notes (Signed)
 Sent message, via epic in basket, requesting orders in epic from [facility].

## 2024-03-14 NOTE — Patient Instructions (Signed)
 SURGICAL WAITING ROOM VISITATION  Patients having surgery or a procedure may have no more than 2 support people in the waiting area - these visitors may rotate.    Children under the age of 66 must have an adult with them who is not the patient.  Visitors with respiratory illnesses are discouraged from visiting and should remain at home.  If the patient needs to stay at the hospital during part of their recovery, the visitor guidelines for inpatient rooms apply. Pre-op nurse will coordinate an appropriate time for 1 support person to accompany patient in pre-op.  This support person may not rotate.    Please refer to the [facility] website for the visitor guidelines for Inpatients (after your surgery is over and you are in a regular room).    Your procedure is scheduled on: 03/18/24   Report to [facility] Main Entrance    Report to admitting at 9:00 AM   Call this number if you have problems the morning of surgery [phone]   Do not eat food :After Midnight.   After Midnight you may have the following liquids until 8:30 AM DAY OF SURGERY  Water Non-Citrus Juices (without pulp, NO RED-Apple, White grape, White cranberry) Black Coffee (NO MILK/CREAM OR CREAMERS, sugar ok)  Clear Tea (NO MILK/CREAM OR CREAMERS, sugar ok) regular and decaf                             Plain Jell-O (NO RED)                                           Fruit ices (not with fruit pulp, NO RED)                                     Popsicles (NO RED)                                                               Sports drinks like Gatorade (NO RED)                 The day of surgery:  Drink ONE (1) Pre-Surgery G2 at 8:30 AM the morning of surgery. Drink in one sitting. Do not sip.  This drink was given to you during your hospital  pre-op appointment visit. Nothing else to drink after completing the  Pre-Surgery G2.          If you have questions, please contact your surgeon's  office.   FOLLOW BOWEL PREP AND ANY ADDITIONAL PRE OP INSTRUCTIONS YOU RECEIVED FROM YOUR SURGEON'S OFFICE!!!     Oral Hygiene is also important to reduce your risk of infection.                                    Remember - BRUSH YOUR TEETH THE MORNING OF SURGERY WITH YOUR REGULAR TOOTHPASTE  DENTURES WILL BE REMOVED PRIOR TO SURGERY PLEASE DO NOT APPLY Poly grip OR ADHESIVES!!!  Stop all vitamins and herbal supplements 7 days before surgery.   Take these medicines the morning of surgery with A SIP OF WATER: Inhalers, Amlodipine , Zyrtec, Levothyroxine , Oxycodone , Pantoprazole    DO NOT TAKE ANY ORAL DIABETIC MEDICATIONS DAY OF YOUR SURGERY  How to Manage Your Diabetes Before and After Surgery  Why is it important to control my blood sugar before and after surgery? Improving blood sugar levels before and after surgery helps healing and can limit problems. A way of improving blood sugar control is eating a healthy diet by:  Eating less sugar and carbohydrates  Increasing activity/exercise  Talking with your doctor about reaching your blood sugar goals High blood sugars (greater than 180 mg/dL) can raise your risk of infections and slow your recovery, so you will need to focus on controlling your diabetes during the weeks before surgery. Make sure that the doctor who takes care of your diabetes knows about your planned surgery including the date and location.  How do I manage my blood sugar before surgery? Check your blood sugar at least 4 times a day, starting 2 days before surgery, to make sure that the level is not too high or low. Check your blood sugar the morning of your surgery when you wake up and every 2 hours until you get to the Short Stay unit. If your blood sugar is less than 70 mg/dL, you will need to treat for low blood sugar: Do not take insulin. Treat a low blood sugar (less than 70 mg/dL) with  cup of clear juice (cranberry or apple), 4 glucose tablets, OR glucose  gel. Recheck blood sugar in 15 minutes after treatment (to make sure it is greater than 70 mg/dL). If your blood sugar is not greater than 70 mg/dL on recheck, call [phone] for further instructions. Report your blood sugar to the short stay nurse when you get to Short Stay.  If you are admitted to the hospital after surgery: Your blood sugar will be checked by the staff and you will probably be given insulin after surgery (instead of oral diabetes medicines) to make sure you have good blood sugar levels. The goal for blood sugar control after surgery is 80-180 mg/dL.   WHAT DO I DO ABOUT MY DIABETES MEDICATION?  Do not take oral diabetes medicines (pills) the morning of surgery.  Do not take Mounjaro 03/13/24.  DO NOT TAKE THE FOLLOWING 7 DAYS PRIOR TO SURGERY: Ozempic, Wegovy, Rybelsus (Semaglutide), Byetta (exenatide), Bydureon (exenatide ER), Victoza, Saxenda (liraglutide), or Trulicity (dulaglutide) Mounjaro (Tirzepatide) Adlyxin (Lixisenatide), Polyethylene Glycol Loxenatide.  Reviewed and Endorsed by [facility] Patient Education Committee, August 2015                              You may not have any metal on your body including jewelry, and body piercing             Do not wear lotions, powders, cologne, or deodorant              Men may shave face and neck.   Do not bring valuables to the hospital. [place] IS NOT             RESPONSIBLE   FOR VALUABLES.   Contacts, glasses, dentures or bridgework may not be worn into surgery.   Bring small overnight bag day of surgery.   DO NOT BRING YOUR HOME MEDICATIONS TO THE HOSPITAL. PHARMACY WILL DISPENSE MEDICATIONS  LISTED ON YOUR MEDICATION LIST TO YOU DURING YOUR ADMISSION IN THE HOSPITAL!              Please read over the following fact sheets you were given: IF YOU HAVE QUESTIONS ABOUT YOUR PRE-OP INSTRUCTIONS PLEASE CALL [name]    If you received a COVID test during your pre-op visit  it is requested  that you wear a mask when out in public, stay away from anyone that may not be feeling well and notify your surgeon if you develop symptoms. If you test positive for Covid or have been in contact with anyone that has tested positive in the last 10 days please notify you surgeon.      Pre-operative 5 CHG Bath Instructions   You can play a key role in reducing the risk of infection after surgery. Your skin needs to be as free of germs as possible. You can reduce the number of germs on your skin by washing with CHG (chlorhexidine  gluconate) soap before surgery. CHG is an antiseptic soap that kills germs and continues to kill germs even after washing.   DO NOT use if you have an allergy to chlorhexidine /CHG or antibacterial soaps. If your skin becomes reddened or irritated, stop using the CHG and notify one of our RNs at [phone].   Please shower with the CHG soap starting 4 days before surgery using the following schedule:     Please keep in mind the following:  DO NOT shave, including legs and underarms, starting the day of your first shower.   You may shave your face at any point before/day of surgery.  Place clean sheets on your bed the day you start using CHG soap. Use a clean washcloth (not used since being washed) for each shower. DO NOT sleep with pets once you start using the CHG.   CHG Shower Instructions:  If you choose to wash your hair and private area, wash first with your normal shampoo/soap.  After you use shampoo/soap, rinse your hair and body thoroughly to remove shampoo/soap residue.  Turn the water OFF and apply about 3 tablespoons (45 ml) of CHG soap to a CLEAN washcloth.  Apply CHG soap ONLY FROM YOUR NECK DOWN TO YOUR TOES (washing for 3-5 minutes)  DO NOT use CHG soap on face, private areas, open wounds, or sores.  Pay special attention to the area where your surgery is being performed.  If you are having back surgery, having someone wash your back for you may be  helpful. Wait 2 minutes after CHG soap is applied, then you may rinse off the CHG soap.  Pat dry with a clean towel  Put on clean clothes/pajamas   If you choose to wear lotion, please use ONLY the CHG-compatible lotions on the back of this paper.     Additional instructions for the day of surgery: DO NOT APPLY any lotions, deodorants, cologne, or perfumes.   Put on clean/comfortable clothes.  Brush your teeth.  Ask your nurse before applying any prescription medications to the skin.      CHG Compatible Lotions   Aveeno Moisturizing lotion  Cetaphil Moisturizing Cream  Cetaphil Moisturizing Lotion  Clairol Herbal Essence Moisturizing Lotion, Dry Skin  Clairol Herbal Essence Moisturizing Lotion, Extra Dry Skin  Clairol Herbal Essence Moisturizing Lotion, Normal Skin  Curel Age Defying Therapeutic Moisturizing Lotion with Alpha Hydroxy  Curel Extreme Care Body Lotion  Curel Soothing Hands Moisturizing Hand Lotion  Curel Therapeutic Moisturizing Cream, Fragrance-Free  Curel Therapeutic Moisturizing Lotion, Fragrance-Free  Curel Therapeutic Moisturizing Lotion, Original Formula  Eucerin Daily Replenishing Lotion  Eucerin Dry Skin Therapy Plus Alpha Hydroxy Crme  Eucerin Dry Skin Therapy Plus Alpha Hydroxy Lotion  Eucerin Original Crme  Eucerin Original Lotion  Eucerin Plus Crme Eucerin Plus Lotion  Eucerin TriLipid Replenishing Lotion  Keri Anti-Bacterial Hand Lotion  Keri Deep Conditioning Original Lotion Dry Skin Formula Softly Scented  Keri Deep Conditioning Original Lotion, Fragrance Free Sensitive Skin Formula  Keri Lotion Fast Absorbing Fragrance Free Sensitive Skin Formula  Keri Lotion Fast Absorbing Softly Scented Dry Skin Formula  Keri Original Lotion  Keri Skin Renewal Lotion Keri Silky Smooth Lotion  Keri Silky Smooth Sensitive Skin Lotion  Nivea Body Creamy Conditioning Oil  Nivea Body Extra Enriched Teacher, adult education  Moisturizing Lotion Nivea Crme  Nivea Skin Firming Lotion  NutraDerm 30 Skin Lotion  NutraDerm Skin Lotion  NutraDerm Therapeutic Skin Cream  NutraDerm Therapeutic Skin Lotion  ProShield Protective Hand Cream  Provon moisturizing lotion  WHAT IS A BLOOD TRANSFUSION? Blood Transfusion Information  A transfusion is the replacement of blood or some of its parts. Blood is made up of multiple cells which provide different functions. Red blood cells carry oxygen  and are used for blood loss replacement. White blood cells fight against infection. Platelets control bleeding. Plasma helps clot blood. Other blood products are available for specialized needs, such as hemophilia or other clotting disorders. BEFORE THE TRANSFUSION  Who gives blood for transfusions?  Healthy volunteers who are fully evaluated to make sure their blood is safe. This is blood bank blood. Transfusion therapy is the safest it has ever been in the practice of medicine. Before blood is taken from a donor, a complete history is taken to make sure that person has no history of diseases nor engages in risky social behavior (examples are intravenous drug use or sexual activity with multiple partners). The donor's travel history is screened to minimize risk of transmitting infections, such as malaria. The donated blood is tested for signs of infectious diseases, such as HIV and hepatitis. The blood is then tested to be sure it is compatible with you in order to minimize the chance of a transfusion reaction. If you or a relative donates blood, this is often done in anticipation of surgery and is not appropriate for emergency situations. It takes many days to process the donated blood. RISKS AND COMPLICATIONS Although transfusion therapy is very safe and saves many lives, the main dangers of transfusion include:  Getting an infectious disease. Developing a transfusion reaction. This is an allergic reaction to something in the blood you  were given. Every precaution is taken to prevent this. The decision to have a blood transfusion has been considered carefully by your caregiver before blood is given. Blood is not given unless the benefits outweigh the risks. AFTER THE TRANSFUSION Right after receiving a blood transfusion, you will usually feel much better and more energetic. This is especially true if your red blood cells have gotten low (anemic). The transfusion raises the level of the red blood cells which carry oxygen , and this usually causes an energy increase. The nurse administering the transfusion will monitor you carefully for complications. HOME CARE INSTRUCTIONS  No special instructions are needed after a transfusion. You may find your energy is better. Speak with your caregiver about any limitations on activity for underlying diseases you may have. SEEK MEDICAL CARE IF:  Your condition is  not improving after your transfusion. You develop redness or irritation at the intravenous (IV) site. SEEK IMMEDIATE MEDICAL CARE IF:  Any of the following symptoms occur over the next 12 hours: Shaking chills. You have a temperature by mouth above 102 F (38.9 C), not controlled by medicine. Chest, back, or muscle pain. People around you feel you are not acting correctly or are confused. Shortness of breath or difficulty breathing. Dizziness and fainting. You get a rash or develop hives. You have a decrease in urine output. Your urine turns a dark color or changes to pink, red, or brown. Any of the following symptoms occur over the next 10 days: You have a temperature by mouth above 102 F (38.9 C), not controlled by medicine. Shortness of breath. Weakness after normal activity. The white part of the eye turns yellow (jaundice). You have a decrease in the amount of urine or are urinating less often. Your urine turns a dark color or changes to pink, red, or brown. Document Released: 09/09/2000 Document Revised: 12/05/2011  Document Reviewed: 04/28/2008 [employer] Patient Information 2014 [place], [place].  _______________________________________________________________________  Incentive Spirometer  An incentive spirometer is a tool that can help keep your lungs clear and active. This tool measures how well you are filling your lungs with each breath. Taking long deep breaths may help reverse or decrease the chance of developing breathing (pulmonary) problems (especially infection) following: A long period of time when you are unable to move or be active. BEFORE THE PROCEDURE  If the spirometer includes an indicator to show your best effort, your nurse or respiratory therapist will set it to a desired goal. If possible, sit up straight or lean slightly forward. Try not to slouch. Hold the incentive spirometer in an upright position. INSTRUCTIONS FOR USE  Sit on the edge of your bed if possible, or sit up as far as you can in bed or on a chair. Hold the incentive spirometer in an upright position. Breathe out normally. Place the mouthpiece in your mouth and seal your lips tightly around it. Breathe in slowly and as deeply as possible, raising the piston or the ball toward the top of the column. Hold your breath for 3-5 seconds or for as long as possible. Allow the piston or ball to fall to the bottom of the column. Remove the mouthpiece from your mouth and breathe out normally. Rest for a few seconds and repeat Steps 1 through 7 at least 10 times every 1-2 hours when you are awake. Take your time and take a few normal breaths between deep breaths. The spirometer may include an indicator to show your best effort. Use the indicator as a goal to work toward during each repetition. After each set of 10 deep breaths, practice coughing to be sure your lungs are clear. If you have an incision (the cut made at the time of surgery), support your incision when coughing by placing a pillow or rolled up towels firmly against  it. Once you are able to get out of bed, walk around indoors and cough well. You may stop using the incentive spirometer when instructed by your caregiver.  RISKS AND COMPLICATIONS Take your time so you do not get dizzy or light-headed. If you are in pain, you may need to take or ask for pain medication before doing incentive spirometry. It is harder to take a deep breath if you are having pain. AFTER USE Rest and breathe slowly and easily. It can be helpful to keep track  of a log of your progress. Your caregiver can provide you with a simple table to help with this. If you are using the spirometer at home, follow these instructions: SEEK MEDICAL CARE IF:  You are having difficultly using the spirometer. You have trouble using the spirometer as often as instructed. Your pain medication is not giving enough relief while using the spirometer. You develop fever of 100.5 F (38.1 C) or higher. SEEK IMMEDIATE MEDICAL CARE IF:  You cough up bloody sputum that had not been present before. You develop fever of 102 F (38.9 C) or greater. You develop worsening pain at or near the incision site. MAKE SURE YOU:  Understand these instructions. Will watch your condition. Will get help right away if you are not doing well or get worse. Document Released: 01/23/2007 Document Revised: 12/05/2011 Document Reviewed: 03/26/2007 [facility] Patient Information 2014 [place], [place].   ________________________________________________________________________

## 2024-03-14 NOTE — Progress Notes (Signed)
 COVID Vaccine Completed: yes  Date of COVID positive in last 90 days:  PCP - Maryln Sober, MD Cardiologist - Charlynn Coombe, MD Pulmonary- Donnice Gale, MD  Cardiac clearance in media tab dated 01/17/24 Medical clearance in media tab dated 03/01/24  Chest x-ray - 12/29/23 Epic EKG - 12/07/23 Epic Stress Test - 01/05/24 [facility] ECHO - 02/02/23 [facility] Cardiac Cath - 12/21/22 [facility] Pacemaker/ICD device last checked: Spinal Cord Stimulator:  Bowel Prep -   Sleep Study -  CPAP -   Fasting Blood Sugar - preDM Checks Blood Sugar _____ times a day  Last dose of GLP1 agonist-  Monjaro GLP1 instructions:  Do not take after  03/10/22   Last dose of SGLT-2 inhibitors-  N/A SGLT-2 instructions:  Do not take after     Blood Thinner Instructions:  Last dose:   Time: Aspirin  Instructions: ASA 81 Last Dose:  Activity level:  Can go up a flight of stairs and perform activities of daily living without stopping and without symptoms of chest pain or shortness of breath.  Able to exercise without symptoms  Unable to go up a flight of stairs without symptoms of     Anesthesia review: heart murmur, HTN, DVT, CKD, OSA mod nonobstructive CAD  Patient denies shortness of breath, fever, cough and chest pain at PAT appointment  Patient verbalized understanding of instructions that were given to them at the PAT appointment. Patient was also instructed that they will need to review over the PAT instructions again at home before surgery.

## 2024-03-15 ENCOUNTER — Other Ambulatory Visit: Payer: Self-pay

## 2024-03-15 ENCOUNTER — Encounter (HOSPITAL_COMMUNITY)
Admission: RE | Admit: 2024-03-15 | Discharge: 2024-03-15 | Disposition: A | Discharge status: Home / Self Care | Payer: PRIVATE HEALTH INSURANCE | Source: Ambulatory Visit | Attending: Orthopedic Surgery | Admitting: Orthopedic Surgery

## 2024-03-15 ENCOUNTER — Encounter (HOSPITAL_COMMUNITY): Payer: Self-pay

## 2024-03-15 ENCOUNTER — Encounter (HOSPITAL_COMMUNITY): Payer: Self-pay | Admitting: Medical

## 2024-03-15 VITALS — BP 131/78 | HR 70 | Temp 98.4°F | Resp 16 | Ht 65.0 in | Wt 170.0 lb

## 2024-03-15 DIAGNOSIS — Z01818 Encounter for other preprocedural examination: Secondary | ICD-10-CM | POA: Diagnosis present

## 2024-03-15 DIAGNOSIS — E119 Type 2 diabetes mellitus without complications: Secondary | ICD-10-CM | POA: Diagnosis not present

## 2024-03-15 HISTORY — DX: Atherosclerotic heart disease of native coronary artery without angina pectoris: I25.10

## 2024-03-15 HISTORY — DX: Unspecified osteoarthritis, unspecified site: M19.90

## 2024-03-15 HISTORY — DX: Pneumonia, unspecified organism: J18.9

## 2024-03-15 LAB — TYPE AND SCREEN
ABO/RH(D): A NEG
Antibody Screen: NEGATIVE

## 2024-03-15 LAB — SURGICAL PCR SCREEN
MRSA, PCR: NEGATIVE
Staphylococcus aureus: NEGATIVE

## 2024-03-15 LAB — GLUCOSE, CAPILLARY: Glucose-Capillary: 154 mg/dL — ABNORMAL HIGH (ref 70–99)

## 2024-03-16 IMAGING — DX DG CHEST 1V
1 series · 1 of 1 positions shown · non-contrast
Comparison: 01/29/2021

CLINICAL DATA: post op weakness

EXAM:
CHEST  1 VIEW

[chest ap]
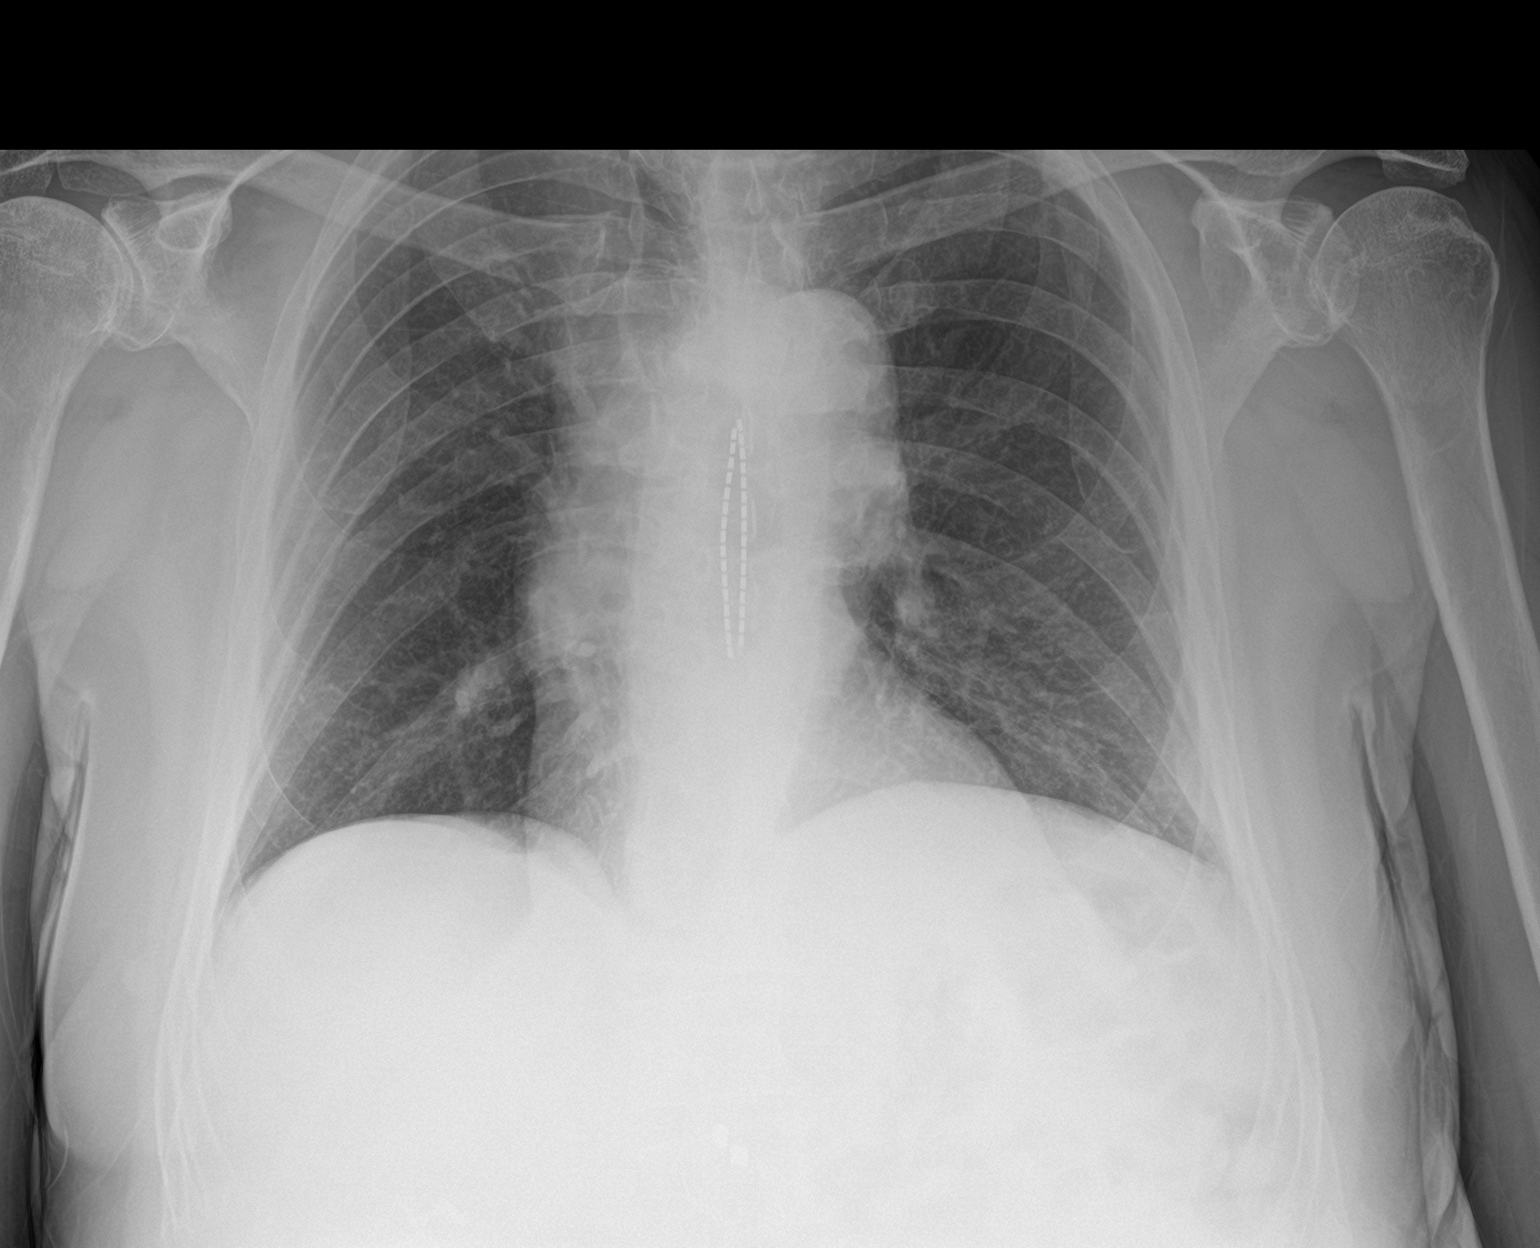

[1 of 1 positions shown; findings below may reference images not displayed]

FINDINGS: Cardiomediastinal silhouette and pulmonary vasculature are within
normal limits.

Lungs are clear. Neurostimulator leads again noted in the
midthoracic spine.
IMPRESSION: No acute cardiopulmonary process.

## 2024-03-16 IMAGING — US US EXTREM LOW VENOUS*L*
1 series · 13 of 24 positions shown · non-contrast
Comparison: None Available.

CLINICAL DATA: Left lower extremity pain and edema. History of left
knee surgery on 01/19/2022. Evaluate for DVT.



[Series 1: us venous img lower uni left (dvt) · portal-venous · 13 of 46 slices shown]
[im 1/46]
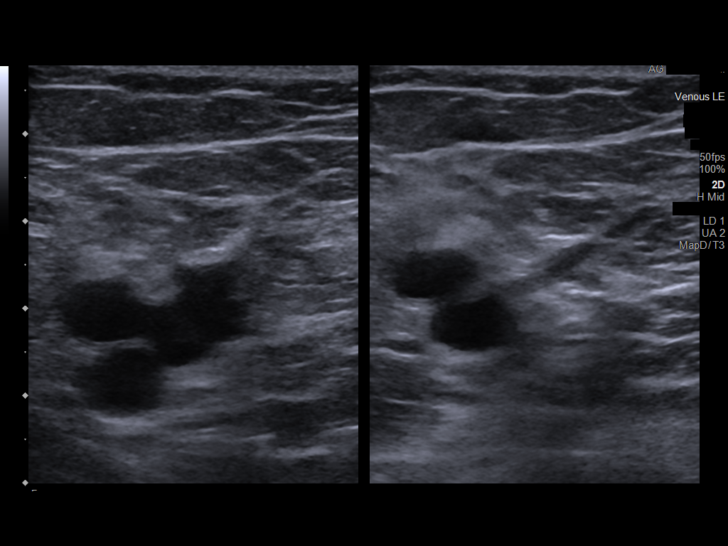
[im 4/46]
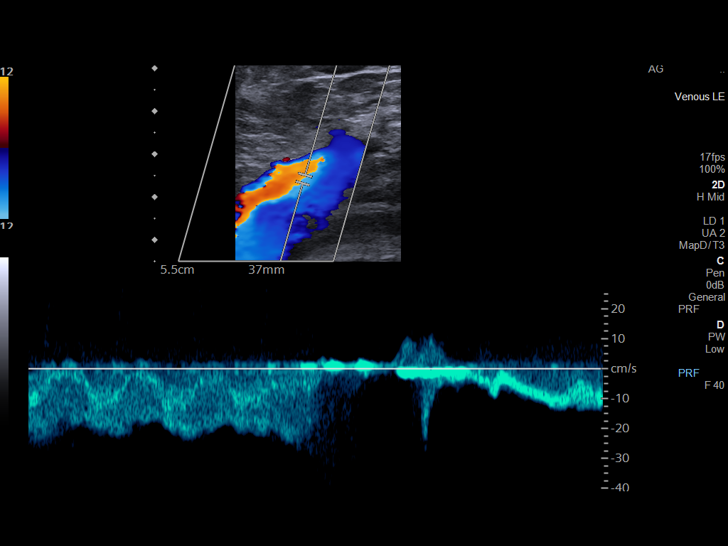
[im 8/46]
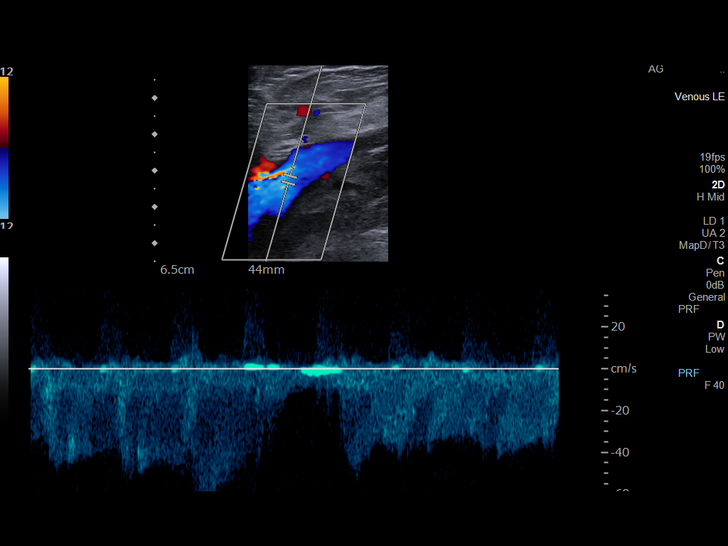
[im 12/46]
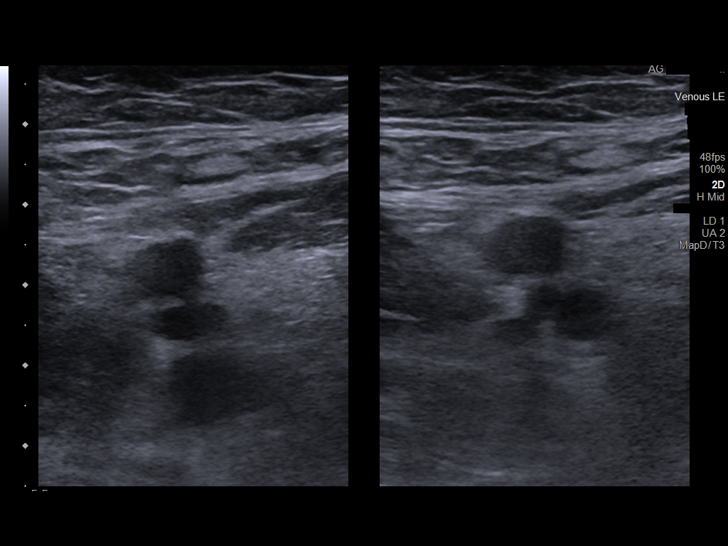
[im 16/46]
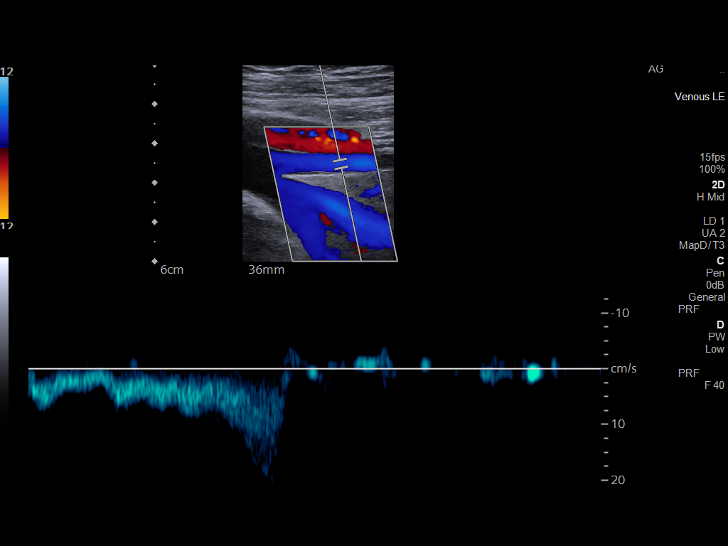
[im 20/46]
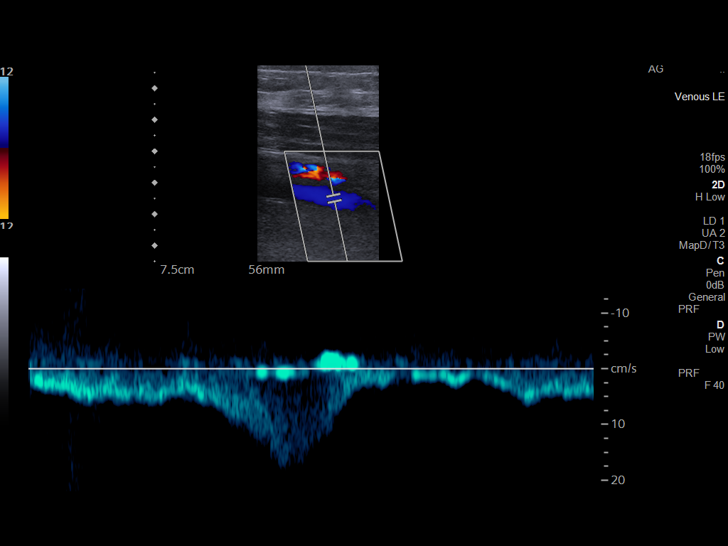
[im 24/46]
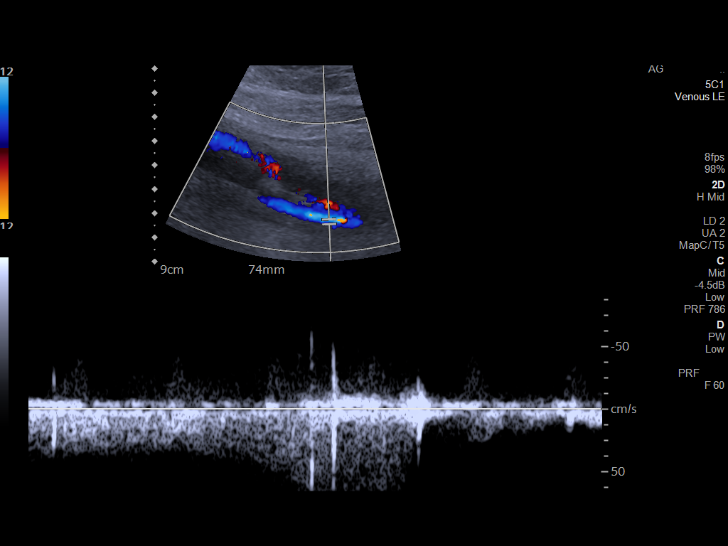
[im 26/46]
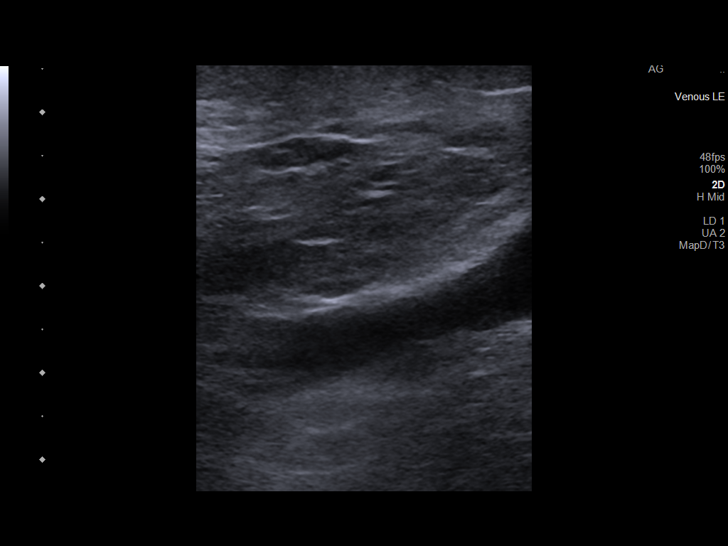
[im 30/46]
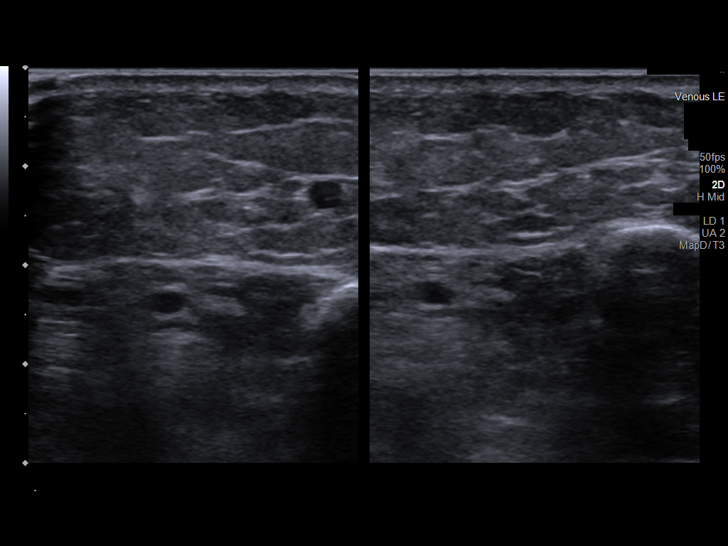
[im 34/46]
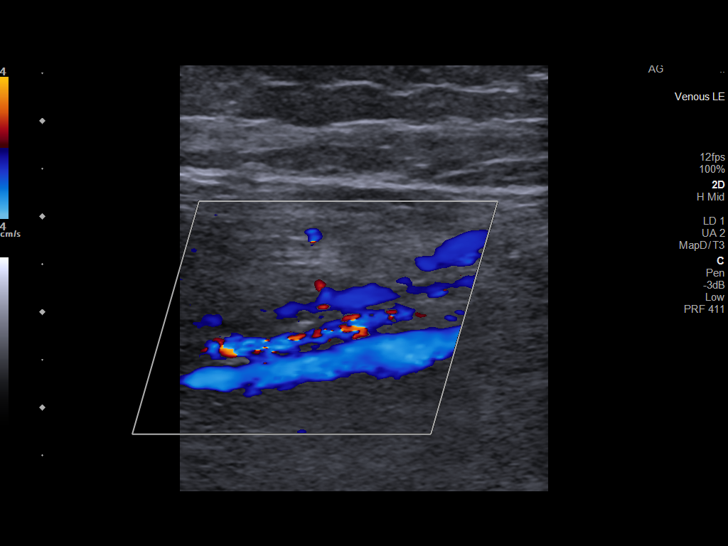
[im 38/46]
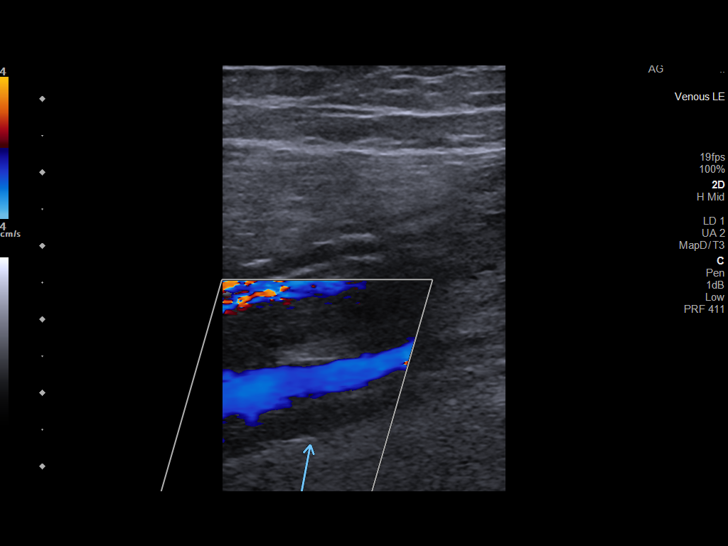
[im 42/46]
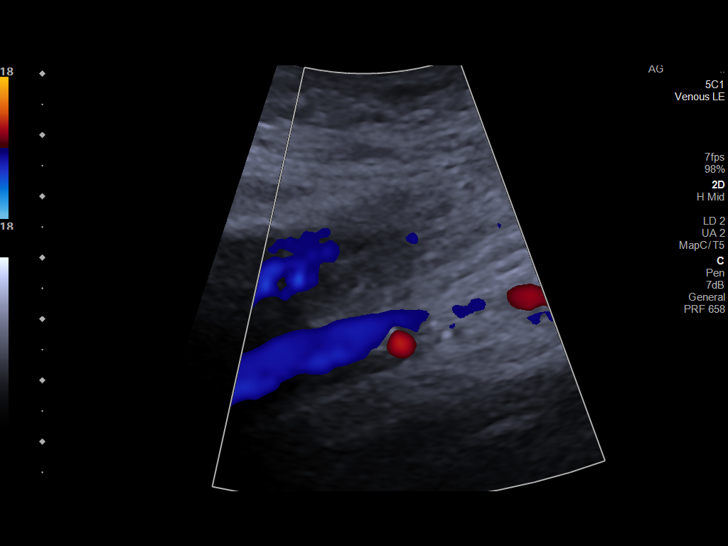
[im 46/46]
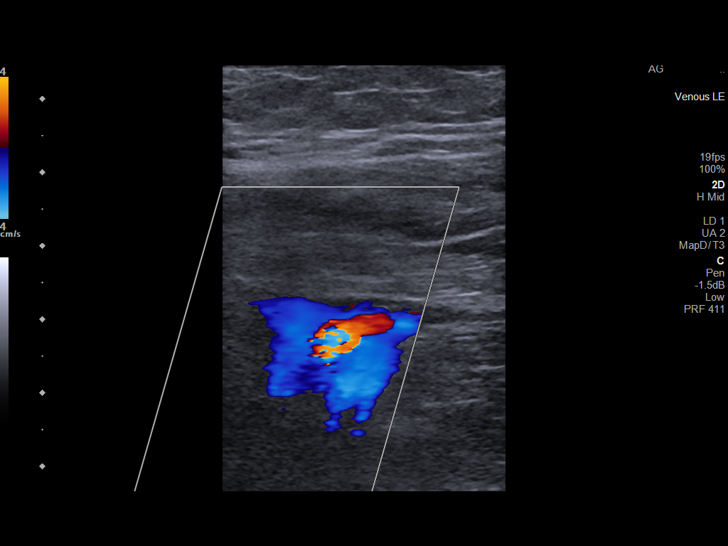

[13 of 24 positions shown; findings below may reference images not displayed]

FINDINGS: Contralateral Common Femoral Vein: Respiratory phasicity is normal
and symmetric with the symptomatic side. No evidence of thrombus.
Normal compressibility.

Common Femoral Vein: No evidence of thrombus. Normal
compressibility, respiratory phasicity and response to augmentation.

Saphenofemoral Junction: No evidence of thrombus. Normal
compressibility and flow on color Doppler imaging.

Profunda Femoral Vein: No evidence of thrombus. Normal
compressibility and flow on color Doppler imaging.

Femoral Vein: No evidence of thrombus. Normal compressibility,
respiratory phasicity and response to augmentation.

Popliteal Vein: No evidence of thrombus. Normal compressibility,
respiratory phasicity and response to augmentation.

Calf Veins: There is hypoechoic thrombus involving one of the paired
divisions of the left peroneal vein (representative images 39
through 44). The anterior and posterior tibial veins appear patent
where imaged.

Superficial Great Saphenous Vein: No evidence of thrombus. Normal
compressibility.

Other Findings:  None.
IMPRESSION: Examination is positive for occlusive DVT involving one of the
paired divisions of the left peroneal vein. There is no extension of
this distal tibial DVT to the more proximal venous system of the
left lower extremity.

## 2024-03-18 ENCOUNTER — Ambulatory Visit (HOSPITAL_COMMUNITY)
Admission: RE | Admit: 2024-03-18 | Discharge: 2024-03-18 | Disposition: A | Discharge status: Home / Self Care | Payer: PRIVATE HEALTH INSURANCE | Attending: Vascular Surgery | Admitting: Vascular Surgery

## 2024-03-18 ENCOUNTER — Encounter (HOSPITAL_COMMUNITY)
Admission: RE | Disposition: A | Discharge status: Home / Self Care | Payer: Self-pay | Source: Home / Self Care | Attending: Vascular Surgery

## 2024-03-18 ENCOUNTER — Other Ambulatory Visit: Payer: Self-pay

## 2024-03-18 DIAGNOSIS — Z86718 Personal history of other venous thrombosis and embolism: Secondary | ICD-10-CM

## 2024-03-18 DIAGNOSIS — Z01818 Encounter for other preprocedural examination: Secondary | ICD-10-CM | POA: Diagnosis present

## 2024-03-18 HISTORY — PX: IVC FILTER INSERTION: CATH118245

## 2024-03-18 LAB — POCT I-STAT, CHEM 8
BUN: 10 mg/dL (ref 8–23)
Calcium, Ion: 1.16 mmol/L (ref 1.15–1.40)
Chloride: 86 mmol/L — ABNORMAL LOW (ref 98–111)
Creatinine, Ser: 1 mg/dL (ref 0.61–1.24)
Glucose, Bld: 128 mg/dL — ABNORMAL HIGH (ref 70–99)
HCT: 39 % (ref 39.0–52.0)
Hemoglobin: 13.3 g/dL (ref 13.0–17.0)
Potassium: 3.3 mmol/L — ABNORMAL LOW (ref 3.5–5.1)
Sodium: 128 mmol/L — ABNORMAL LOW (ref 135–145)
TCO2: 29 mmol/L (ref 22–32)

## 2024-03-18 SURGERY — IVC FILTER INSERTION
Anesthesia: LOCAL

## 2024-03-18 MED ORDER — HEPARIN (PORCINE) IN NACL 1000-0.9 UT/500ML-% IV SOLN
INTRAVENOUS | Status: DC | PRN
  Administered 2024-03-18: 500 mL

## 2024-03-18 MED ORDER — LIDOCAINE HCL (PF) 1 % IJ SOLN
INTRAMUSCULAR | Status: DC | PRN
Start: 2024-03-18 — End: 2024-03-18
  Administered 2024-03-18: 10 mL

## 2024-03-18 MED ORDER — ONDANSETRON HCL 4 MG/2ML IJ SOLN: 4.0000 mg | Freq: Four times a day (QID) | INTRAMUSCULAR | Status: DC | PRN

## 2024-03-18 MED ORDER — FENTANYL CITRATE (PF) 100 MCG/2ML IJ SOLN
INTRAMUSCULAR | Status: AC
  Filled 2024-03-18: qty 2

## 2024-03-18 MED ORDER — SODIUM CHLORIDE 0.9% FLUSH: 3.0000 mL | INTRAVENOUS | Status: DC | PRN

## 2024-03-18 MED ORDER — LABETALOL HCL 5 MG/ML IV SOLN: 10.0000 mg | INTRAVENOUS | Status: DC | PRN

## 2024-03-18 MED ORDER — HYDRALAZINE HCL 20 MG/ML IJ SOLN: 5.0000 mg | INTRAMUSCULAR | Status: DC | PRN

## 2024-03-18 MED ORDER — SODIUM CHLORIDE 0.9 % IV SOLN: INTRAVENOUS | Status: DC

## 2024-03-18 MED ORDER — ACETAMINOPHEN 325 MG PO TABS: 650.0000 mg | ORAL_TABLET | ORAL | Status: DC | PRN

## 2024-03-18 MED ORDER — MIDAZOLAM HCL 2 MG/2ML IJ SOLN
INTRAMUSCULAR | Status: DC | PRN
  Administered 2024-03-18: 1 mg via INTRAVENOUS

## 2024-03-18 MED ORDER — IODIXANOL 320 MG/ML IV SOLN
INTRAVENOUS | Status: DC | PRN
  Administered 2024-03-18: 10 mL

## 2024-03-18 MED ORDER — MIDAZOLAM HCL 2 MG/2ML IJ SOLN
INTRAMUSCULAR | Status: AC
  Filled 2024-03-18: qty 2

## 2024-03-18 MED ORDER — FENTANYL CITRATE (PF) 100 MCG/2ML IJ SOLN
INTRAMUSCULAR | Status: DC | PRN
  Administered 2024-03-18: 50 ug via INTRAVENOUS

## 2024-03-18 MED ORDER — OXYCODONE HCL 5 MG PO TABS: 5.0000 mg | ORAL_TABLET | ORAL | Status: DC | PRN

## 2024-03-18 MED ORDER — LIDOCAINE HCL (PF) 1 % IJ SOLN
INTRAMUSCULAR | Status: AC
  Filled 2024-03-18: qty 30

## 2024-03-18 MED ORDER — SODIUM CHLORIDE 0.9% FLUSH: 3.0000 mL | Freq: Two times a day (BID) | INTRAVENOUS | Status: DC

## 2024-03-18 MED ORDER — SODIUM CHLORIDE 0.9 % IV SOLN: 250.0000 mL | INTRAVENOUS | Status: DC | PRN

## 2024-03-18 SURGICAL SUPPLY — 5 items
FILTER VC CELECT-FEMORAL (Filter) IMPLANT
KIT MICROPUNCTURE NIT STIFF (SHEATH) IMPLANT
SHEATH PROBE COVER 6X72 (BAG) IMPLANT
TRAY PV CATH (CUSTOM PROCEDURE TRAY) ×2 IMPLANT
WIRE BENTSON .035X145CM (WIRE) IMPLANT

## 2024-03-18 NOTE — H&P (Signed)
 H+P    History of Present Illness: This is a 78 y.o. male plan for left total knee arthroplasty.  He has a history of left lower extremity DVT at the time of a previous knee surgery.  He has no other history of DVT and no personal or family history of known clotting disorders.  He is not currently on anticoagulation.  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 shoulder (exc)   Neuromuscular disorder (HCC)    feet   Pancreatitis    Pneumonia    PONV (postoperative nausea and vomiting)    Pre-diabetes    Prostate CA (HCC)    Sleep apnea    hx of no longer has per   Spondylosis of lumbar spine    Stroke (HCC) 2021   TIA x 2 some memory loss.   Thyroid disease     Past Surgical History:  Procedure Laterality Date   BACK SURGERY  2013   L4-L5 and a redo in 2014 after a fall   CHOLECYSTECTOMY  1992   FOOT SURGERY Left 1999   MOHS SURGERY     PROSTATECTOMY  2008   ROTATOR CUFF REPAIR Left 2021   SPINAL CORD STIMULATOR IMPLANT  2020   TOTAL KNEE ARTHROPLASTY Right 2005   TOTAL KNEE ARTHROPLASTY Left 01/19/2022   Procedure: TOTAL KNEE ARTHROPLASTY;  Surgeon: Gerome Charleston, MD;  Location: [facility];  Service: Orthopedics;  Laterality: Left;  adductor canal 120    Allergies  Allergen Reactions   Reclast [Zoledronic Acid] Other (See Comments)    Ended up in hospital/ high fever and    Tapentadol Rash and Other (See Comments)    Breaks patient out   Nucynta    Paroxetine Hcl Other (See Comments)     Irritability    Statins Other (See Comments)    Muscle aches   Adhesive [Tape]     Breaks patient out    Ciprofloxacin Hives   Mannitol Other (See Comments)    Unknown   Water For Injection [Water, Sterile]     unknown   Flagyl [Metronidazole] Rash   Latex Rash    Irritated skin    Prior to Admission medications   Medication Sig Start Date End Date Taking? Authorizing Provider  albuterol  (PROVENTIL  HFA;VENTOLIN  HFA) 108 (90 Base) MCG/ACT inhaler Inhale 2 puffs into the lungs every 6 (six) hours as needed. Patient taking differently: Inhale 2 puffs into the lungs every 6 (six) hours as needed for wheezing or shortness of breath. 05/13/18  Yes Rancour, Garnette, MD  amLODipine  (NORVASC ) 5 MG tablet Take 5 mg by mouth daily. 02/26/24  Yes [provider]  Ascorbic Acid (VITAMIN C GUMMIE PO) Take 1 tablet by mouth in the morning.   Yes [provider]  aspirin  EC 81 MG tablet Take 81 mg by mouth at bedtime.   Yes [provider]  cetirizine (ZYRTEC) 10 MG tablet Take 10 mg by mouth at bedtime.   Yes [provider]  chlorthalidone (HYGROTON) 25 MG tablet Take 25 mg by mouth daily.   Yes [provider]  clotrimazole-betamethasone (LOTRISONE) cream Apply 1 Application topically 2 (two) times daily as needed (skin irritation.). 08/30/23  Yes [provider]  levothyroxine  (SYNTHROID ) 100 MCG tablet Take 100 mcg by mouth daily before breakfast. 12/06/18  Yes [provider]  lisinopril (ZESTRIL) 10 MG tablet Take 10 mg by mouth in the morning.   Yes [provider]  montelukast (SINGULAIR) 10 MG tablet Take 10 mg by mouth at bedtime.   Yes [provider]  Multiple Vitamin (MULTIVITAMIN WITH MINERALS) TABS tablet Take 1 tablet by mouth in the morning.   Yes [provider]  mupirocin ointment (BACTROBAN) 2 % Apply 1 Application topically 3 (three) times daily as needed (WOUND CARE). 02/07/22  Yes [provider]  Oxycodone  HCl 10 MG TABS Take 10 mg by mouth every 6 (six) hours as needed (breakthrough pain.).   Yes [provider]  pantoprazole  (PROTONIX ) 40 MG tablet Take 1 tablet (40 mg total) by mouth daily. 01/21/13  Yes Georgina Nancyann ORN, MD  polyethylene glycol (MIRALAX  / GLYCOLAX ) 17 g packet Take 17 g by mouth daily as needed for mild constipation. 01/30/22  Yes Samtani, Jai-Gurmukh, MD  ranolazine  (RANEXA ) 500 MG 12 hr tablet Take 500 mg by mouth 2 (two) times daily.   Yes [provider]  REPATHA SURECLICK 140 MG/ML SOAJ Inject 140 mg into the skin every 14 (fourteen) days. Wednesday   Yes [provider]  traZODone  (DESYREL ) 150 MG tablet Take 150 mg by mouth at bedtime.   Yes [provider]  Vitamin D-Vitamin K (D3 + K2 PO) Take 1 tablet by mouth in the morning.   Yes [provider]  tirzepatide CLOYDE) 2.5 MG/0.5ML Pen Inject 2.5 mg into the skin every Friday.    [provider]  XTAMPZA  ER 9 MG C12A Take 9 mg by mouth 2 (two) times daily.    [provider]    Social History   Socioeconomic History   Marital status: Married    Spouse name: Not on file   Number of children: Not on file   Years of education: Not on file   Highest education level: Not on file  Occupational History   Not on file  Tobacco Use   Smoking status: Never   Smokeless tobacco: Never  Vaping Use   Vaping status: Never Used  Substance and Sexual Activity   Alcohol use: No   Drug use: No   Sexual activity: Not on file  Other Topics Concern   Not on file  Social History Narrative   Not on file   Social Drivers of Health   Financial Resource Strain: Low Risk  (11/23/2023)   Received from [facility]   Overall Financial Resource Strain (CARDIA)    Difficulty of Paying Living Expenses: Not hard at all  Food Insecurity: No Food Insecurity (11/23/2023)   Received from [facility]   Hunger Vital Sign    Within the past 12  months, you worried that your food would run out before you got the money to buy more.: Never true    Within the past 12 months, the food you bought just didn't last and you didn't have money to get more.: Never true  Transportation Needs: No Transportation Needs (11/23/2023)  Received from [facility]   PRAPARE - Transportation    Lack of Transportation (Medical): No    Lack of Transportation (Non-Medical): No  Physical Activity: Insufficiently Active (11/23/2023)   Received from [facility]   Exercise Vital Sign    On average, how many days per week do you engage in moderate to strenuous exercise (like a brisk walk)?: 5 days    On average, how many minutes do you engage in exercise at this level?: 20 min  Stress: No Stress Concern Present (11/23/2023)   Received from [facility] - Occupational Stress Questionnaire    Feeling of Stress : Only a little  Social Connections: Somewhat Isolated (11/23/2023)   Received from [facility]   Social Network    How would you rate your social network (family, work, friends)?: Restricted participation with some degree of social isolation  Intimate Partner Violence: Not At Risk (12/08/2023)   Received from [facility]   HITS    Over the last 12 months how often did your partner physically hurt you?: Never    Over the last 12 months how often did your partner insult you or talk down to you?: Never    Over the last 12 months how often did your partner threaten you with physical harm?: Never    Over the last 12 months how often did your partner scream or curse at you?: Never     No family history on file.  ROS: Joint pain  Physical Examination  Vitals:   03/18/24 0710  BP: (!) 142/86  Pulse: 60  Resp: 18  Temp: 97.8 F (36.6 C)  SpO2: 97%   Body mass index is 27.46 kg/m.  Awake alert and oriented Nonlabored respirations Bilateral lower extremities warm and well-perfused  CBC     Component Value Date/Time   WBC 9.3 12/29/2023 1945   RBC 4.50 12/29/2023 1945   HGB 14.2 12/29/2023 1945   HCT 40.7 12/29/2023 1945   PLT 230 12/29/2023 1945   MCV 90.4 12/29/2023 1945   MCH 31.6 12/29/2023 1945   MCHC 34.9 12/29/2023 1945   RDW 12.5 12/29/2023 1945   LYMPHSABS 2.6 12/29/2023 1945   MONOABS 0.9 12/29/2023 1945   EOSABS 0.4 12/29/2023 1945   BASOSABS 0.1 12/29/2023 1945    BMET    Component Value Date/Time   NA 131 (L) 12/29/2023 1945   K 3.5 12/29/2023 1945   CL 93 (L) 12/29/2023 1945   CO2 28 12/29/2023 1945   GLUCOSE 110 (H) 12/29/2023 1945   BUN 21 12/29/2023 1945   CREATININE 1.34 (H) 12/29/2023 1945   CALCIUM 9.0 12/29/2023 1945   GFRNONAA 55 (L) 12/29/2023 1945   GFRAA >60 08/10/2018 1539    COAGS: Lab Results  Component Value Date   INR 1.1 04/23/2023   INR 0.98 11/06/2010     Non-Invasive Vascular Imaging:   EXAM: LEFT LOWER EXTREMITY VENOUS DOPPLER ULTRASOUND   TECHNIQUE: Gray-scale sonography with graded compression, as well as color Doppler and duplex ultrasound were performed to evaluate the lower extremity deep venous systems from the level of the common femoral vein and including the common femoral, femoral, profunda femoral, popliteal and calf veins including the posterior tibial, peroneal and gastrocnemius veins when visible. The superficial great saphenous vein was also interrogated. Spectral Doppler was utilized to evaluate flow at rest and with distal augmentation maneuvers in the common femoral, femoral and popliteal veins.   COMPARISON:  None Available.  FINDINGS: Contralateral Common Femoral Vein: Respiratory phasicity is normal and symmetric with the symptomatic side. No evidence of thrombus. Normal compressibility.   Common Femoral Vein: No evidence of thrombus. Normal compressibility, respiratory phasicity and response to augmentation.   Saphenofemoral Junction: No evidence of thrombus.  Normal compressibility and flow on color Doppler imaging.   Profunda Femoral Vein: No evidence of thrombus. Normal compressibility and flow on color Doppler imaging.   Femoral Vein: No evidence of thrombus. Normal compressibility, respiratory phasicity and response to augmentation.   Popliteal Vein: No evidence of thrombus. Normal compressibility, respiratory phasicity and response to augmentation.   Calf Veins: There is hypoechoic thrombus involving one of the paired divisions of the left peroneal vein (representative images 39 through 44). The anterior and posterior tibial veins appear patent where imaged.   Superficial Great Saphenous Vein: No evidence of thrombus. Normal compressibility.   Other Findings:  None.   IMPRESSION: Examination is positive for occlusive DVT involving one of the paired divisions of the left peroneal vein. There is no extension of this distal tibial DVT to the more proximal venous system of the left lower extremity.  ASSESSMENT/PLAN: This is a 78 y.o. male with history of left lower extremity DVT at the time of previous knee surgery now planned for left total knee arthroplasty.  Will plan for IVC filter today and I discussed the risk benefits alternatives he agrees to proceed.  We also discussed possible removal in 3 to 4 months however patient possibly would require right knee surgery in the future and we would defer filter removal until this is determined.  I will also discussed with his wife post procedure.  Xochilth Standish C. Sheree, MD [facility] Office: [phone] Pager: [phone]

## 2024-03-18 NOTE — Progress Notes (Signed)
 Notified Delon Cheney of patient's  low K 3.3, Na 128, Cl 86 and that patient had coffee w/ cream at 0600. Delon stated labs were fine but she would need to check on coffee w/ cream.

## 2024-03-18 NOTE — Op Note (Signed)
    Patient name: Joel Kim MRN: [id number] DOB: 11/25/1945 Sex: male  03/18/2024 Pre-operative Diagnosis: History of DVT with need for left knee replacement Post-operative diagnosis:  Same Surgeon:  Penne BROCKS. Sheree, MD Procedure Performed: 1.  Percutaneous ultrasound-guided cannulation right common femoral vein 2.  IVC venogram 3.  Placement of infrarenal Cook Celect IVC filter 4.  Moderate sedation with fentanyl  and Versed  for 9 minutes    Indications: 78 year old male has a history of a DVT in his left lower extremity after his left knee surgery.  He is now planned for left total knee arthroplasty and we are planning for IVC filter which can be removed after he has no further surgical interventions planned.  Findings: The IVC measured approximately 20 mm across and the cava did have a slant the filter was placed with the clock in the midline just below the renal veins.   Procedure:  The patient was identified in the holding area and taken to room 8.  The patient was then placed supine on the table and prepped and draped in the usual sterile fashion.  A time out was called.  Ultrasound was used to evaluate the right common femoral vein.  This was obtained compressible.  The area was anesthetized 1% lidocaine  cannulated with direct ultrasound visualization using micropuncture needle, wire and sheath.  Ultrasound images saved the permanent record.  Concomitantly we administered fentanyl  and Versed  as moderate sedation as vital signs were monitored throughout the case.  We placed a Bentson wire followed by the filter introducer sheath and perform central venogram in 2 locations identified the renal veins.  These were marked on the screen.  The filter was then deployed and a still shot was performed.  Introducer sheath was removed and pressure held hemostasis was obtained.  Patient tolerated the procedure well immediate complication.   Contrast: 10 cc   Paris Hohn C. Sheree, MD [facility] [place] Office: [phone] Pager: [phone]

## 2024-03-19 ENCOUNTER — Encounter (HOSPITAL_COMMUNITY): Payer: Self-pay | Admitting: Vascular Surgery

## 2024-03-19 NOTE — Anesthesia Preprocedure Evaluation (Addendum)
 Anesthesia Evaluation  Patient identified by MRN, date of birth, ID band Patient awake    Reviewed: Allergy & Precautions, H&P , NPO status , Patient's Chart, lab work & pertinent test results  History of Anesthesia Complications (+) PONV and history of anesthetic complications  Airway Mallampati: II   Neck ROM: full    Dental   Pulmonary asthma , sleep apnea    breath sounds clear to auscultation       Cardiovascular hypertension, + CAD   Rhythm:regular Rate:Normal     Neuro/Psych  Headaches  Neuromuscular disease CVA    GI/Hepatic ,GERD  ,,  Endo/Other  diabetes, Type 2Hypothyroidism    Renal/GU      Musculoskeletal  (+) Arthritis ,  Fibromyalgia -  Abdominal   Peds  Hematology   Anesthesia Other Findings   Reproductive/Obstetrics                              Anesthesia Physical Anesthesia Plan  ASA: 3  Anesthesia Plan: General   Post-op Pain Management: Regional block*   Induction: Intravenous  PONV Risk Score and Plan: 3 and Ondansetron , Dexamethasone  and Treatment may vary due to age or medical condition  Airway Management Planned: Oral ETT  Additional Equipment:   Intra-op Plan:   Post-operative Plan: Extubation in OR  Informed Consent: I have reviewed the patients History and Physical, chart, labs and discussed the procedure including the risks, benefits and alternatives for the proposed anesthesia with the patient or authorized representative who has indicated his/her understanding and acceptance.     Dental advisory given  Plan Discussed with: CRNA, Anesthesiologist and Surgeon  Anesthesia Plan Comments: (See PAT note 03/19/2024)        Anesthesia Quick Evaluation

## 2024-03-19 NOTE — Progress Notes (Signed)
 Anesthesia Chart Review   Case: [id number] Date/Time: 03/27/24 1117   Procedure: REVISION, TOTAL ARTHROPLASTY, KNEE (Left: Knee)   Anesthesia type: Spinal   Diagnosis: Failed total left knee replacement, initial encounter (HCC) [T84.093A]   Pre-op diagnosis: Failed left total knee arthroplasty   [facility] RO[facility] / [facility]   Surgeons: Fidel Rogue, MD       DISCUSSION:78 y.o. never smoker with h/o PONV, HTN, hypothyroidism, asthma, TIA, sleep apnea, DM II (A1C 5.4), CKD Stage III, mild to moderate CAD on Cath 12/21/2022, prostate cancer, failed total left knee replacement scheduled for above procedure 03/27/2024 with Dr. Rogue Fidel.   Pt with spinal cord stimulator in place. Currently taking Xtampza  ER 9 mg BID, oxycodone  10 mg every 6 hours as needed.  Pt with h/o left lower extremity DVT with previous knee surgery.  He underwent IVC filter placement 03/18/2024.   Sodium 128 day of IVC filter placement.  Will recheck DOS.   Pt with mild OSA.  Per most recent pulmonology notes 03/06/2024 pt defers CPAP at this time.   Knee surgery previously cancelled due to elevated BP.  Pt now taking lisinopril 10 mg, chlorthalidone 25 mg, and amlodipine  5 mg.      Pt last seen by cardiology 12/21/2023. BP at this visit 130/74. Stress test ordered at this visit.   Stress Test 01/05/2024 with no ischemia.   Echo 02/02/2023 (Care Everywhere) LVEF 55-60%, GLS -20.9%  Grade I diastolic dysfunction  Trace AI  Moderate aortic valve sclerosis  Mild MAC  VS: There were no vitals taken for this visit.  PROVIDERS: Medicine, [facility] (Inactive)  Cardiologist - Lamar Casey, MD  Pulmonary- Gregory Blaze, MD LABS: Labs reviewed: Acceptable for surgery. (all labs ordered are listed, but only abnormal results are displayed)  Labs Reviewed - No data to display   IMAGES:   EKG:   CV: Stress Test 01/05/2024 (Care Everywhere) Impression:   No evidence of inducible ischemia.   No evidence of prior myocardial infarction.  Normal left ventricular function.   Echo 02/02/2023 (Care Everywhere) LVEF 55-60%, GLS -20.9%  Grade I diastolic dysfunction  Trace AI  Moderate aortic valve sclerosis  Mild MAC   Cardiac Cath 12/21/2022 FINDINGS:   Coronary Angiography  1. Left Main - Normal  2. Left anterior descending artery -proximal LAD is calcified.  There is  20 to 30% mid LAD stenosis.  There are 2 diagonal arteries which are both  widely patent  3. Left Circumflex -there is calcification in the proximal  circumflex.there is a 20 to 30% stenosis in the mid circumflex.  The rest  of the circumflex is widely patent.  There are 2 obtuse marginal arteries  which are widely patent.  4. Right Coronary Artery -Additional comments on angiography: There is 30  to 40% stenosis in the proximal and mid right coronary artery.  The PDA  and PLV branches are normal   Hemodynamics  1. Aortic Pressure -96/56 mmHg  2. Left Ventricular -97/9 mmHg   CONCLUSIONS:  Mild to moderate nonobstructive coronary artery disease  Normal end-diastolic pressure  He will follow-up with an office with Dr. Casey in 6 weeks or so   Myocardial Perfusion 09/05/2018 The left ventricular ejection fraction is normal (55-65%). Nuclear stress EF: 58%. There was no ST segment deviation noted during stress. The study is normal.   1. EF 58%, normal wall motion.  2. No evidence for ischemia or infarction by perfusion images.  Normal study.     Echo 08/21/2018 - Left ventricle: The cavity size was normal. There was moderate    concentric hypertrophy. Systolic function was normal. The    estimated ejection fraction was in the range of 60% to 65%. Wall    motion was normal; there were no regional wall motion    abnormalities. Doppler parameters are consistent with abnormal    left ventricular relaxation (grade 1 diastolic dysfunction).    Doppler parameters are consistent with high ventricular  filling    pressure.  - Aortic valve: Transvalvular velocity was within the normal range.    There was no stenosis. There was no regurgitation.  - Mitral valve: Transvalvular velocity was within the normal range.    There was no evidence for stenosis. There was trivial    regurgitation.  - Left atrium: The atrium was moderately dilated.  - Right ventricle: The cavity size was normal. Wall thickness was    normal. Systolic function was normal.  - Tricuspid valve: There was trivial regurgitation.  - Pulmonary arteries: Systolic pressure was within the normal    range. PA peak pressure: 22 mm Hg (S).  - Pericardium, extracardiac: A trivial pericardial effusion was    identified.  Past Medical History:  Diagnosis Date   Anemia    Arthritis    Asthma    Atypical nevus 01/26/2006   right side of body (slight)   Basal cell carcinoma 11/17/2015   left ear rim tx cx3 65fu   Cancer (HCC)    Coronary artery disease    Diabetes mellitus without complication (HCC)    Fibromyalgia    GERD (gastroesophageal reflux disease)    Heart murmur    History of kidney stones    Hypertension    Hypothyroidism    Insomnia    Intractable migraine without aura    Melanoma (HCC) 10/10/2014   right ant. shoulder (exc)   Neuromuscular disorder (HCC)    feet   Pancreatitis    Pneumonia    PONV (postoperative nausea and vomiting)    Pre-diabetes    Prostate CA (HCC)    Sleep apnea    hx of no longer has per   Spondylosis of lumbar spine    Stroke (HCC) 2021   TIA x 2 some memory loss.   Thyroid disease     Past Surgical History:  Procedure Laterality Date   BACK SURGERY  2013   L4-L5 and a redo in 2014 after a fall   CHOLECYSTECTOMY  1992   FOOT SURGERY Left 1999   IVC FILTER INSERTION N/A 03/18/2024   Procedure: IVC FILTER INSERTION;  Surgeon: Sheree Penne Bruckner, MD;  Location: [facility] INVASIVE CV LAB;  Service: Cardiovascular;  Laterality: N/A;   MOHS SURGERY     PROSTATECTOMY  2008    ROTATOR CUFF REPAIR Left 2021   SPINAL CORD STIMULATOR IMPLANT  2020   TOTAL KNEE ARTHROPLASTY Right 2005   TOTAL KNEE ARTHROPLASTY Left 01/19/2022   Procedure: TOTAL KNEE ARTHROPLASTY;  Surgeon: Gerome Charleston, MD;  Location: [facility];  Service: Orthopedics;  Laterality: Left;  adductor canal 120    MEDICATIONS: No current facility-administered medications for this encounter.    albuterol  (PROVENTIL  HFA;VENTOLIN  HFA) 108 (90 Base) MCG/ACT inhaler   amLODipine  (NORVASC ) 5 MG tablet   Ascorbic Acid (VITAMIN C GUMMIE PO)   aspirin  EC 81 MG tablet   cetirizine (ZYRTEC) 10 MG tablet   chlorthalidone (HYGROTON) 25 MG tablet   clotrimazole-betamethasone (LOTRISONE)  cream   levothyroxine  (SYNTHROID ) 100 MCG tablet   lisinopril (ZESTRIL) 10 MG tablet   montelukast (SINGULAIR) 10 MG tablet   Multiple Vitamin (MULTIVITAMIN WITH MINERALS) TABS tablet   mupirocin ointment (BACTROBAN) 2 %   Oxycodone  HCl 10 MG TABS   pantoprazole  (PROTONIX ) 40 MG tablet   polyethylene glycol (MIRALAX  / GLYCOLAX ) 17 g packet   ranolazine  (RANEXA ) 500 MG 12 hr tablet   REPATHA SURECLICK 140 MG/ML SOAJ   traZODone  (DESYREL ) 150 MG tablet   Vitamin D-Vitamin K (D3 + K2 PO)   tirzepatide (MOUNJARO) 2.5 MG/0.5ML Pen   XTAMPZA  ER 9 MG [address] [place], PA-C [facility]-Surgical Testing [phone]

## 2024-03-20 ENCOUNTER — Encounter (HOSPITAL_COMMUNITY): Payer: Self-pay | Admitting: Physician Assistant

## 2024-03-26 ENCOUNTER — Ambulatory Visit: Payer: Self-pay | Admitting: Student

## 2024-03-27 ENCOUNTER — Inpatient Hospital Stay (HOSPITAL_COMMUNITY)
Admission: RE | Admit: 2024-03-27 | Discharge: 2024-03-28 | DRG: 467 | Disposition: A | Discharge status: Home / Self Care | Payer: PRIVATE HEALTH INSURANCE | Attending: Orthopedic Surgery | Admitting: Orthopedic Surgery

## 2024-03-27 ENCOUNTER — Inpatient Hospital Stay (HOSPITAL_COMMUNITY): Payer: Self-pay | Admitting: Physician Assistant

## 2024-03-27 ENCOUNTER — Encounter (HOSPITAL_COMMUNITY): Payer: Self-pay | Admitting: Orthopedic Surgery

## 2024-03-27 ENCOUNTER — Ambulatory Visit: Payer: Self-pay | Admitting: Student

## 2024-03-27 ENCOUNTER — Other Ambulatory Visit: Payer: Self-pay

## 2024-03-27 ENCOUNTER — Inpatient Hospital Stay (HOSPITAL_COMMUNITY)

## 2024-03-27 ENCOUNTER — Encounter (HOSPITAL_COMMUNITY)
Admission: RE | Disposition: A | Discharge status: Home / Self Care | Payer: Self-pay | Source: Home / Self Care | Attending: Orthopedic Surgery

## 2024-03-27 DIAGNOSIS — Z79890 Hormone replacement therapy (postmenopausal): Secondary | ICD-10-CM

## 2024-03-27 DIAGNOSIS — Z7982 Long term (current) use of aspirin: Secondary | ICD-10-CM

## 2024-03-27 DIAGNOSIS — T84093A Other mechanical complication of internal left knee prosthesis, initial encounter: Secondary | ICD-10-CM

## 2024-03-27 DIAGNOSIS — Z881 Allergy status to other antibiotic agents status: Secondary | ICD-10-CM

## 2024-03-27 DIAGNOSIS — I2510 Atherosclerotic heart disease of native coronary artery without angina pectoris: Secondary | ICD-10-CM

## 2024-03-27 DIAGNOSIS — I10 Essential (primary) hypertension: Secondary | ICD-10-CM

## 2024-03-27 DIAGNOSIS — Z85828 Personal history of other malignant neoplasm of skin: Secondary | ICD-10-CM | POA: Diagnosis not present

## 2024-03-27 DIAGNOSIS — M549 Dorsalgia, unspecified: Secondary | ICD-10-CM | POA: Diagnosis present

## 2024-03-27 DIAGNOSIS — Z95828 Presence of other vascular implants and grafts: Secondary | ICD-10-CM

## 2024-03-27 DIAGNOSIS — Z85820 Personal history of malignant melanoma of skin: Secondary | ICD-10-CM

## 2024-03-27 DIAGNOSIS — N183 Chronic kidney disease, stage 3 unspecified: Secondary | ICD-10-CM | POA: Diagnosis present

## 2024-03-27 DIAGNOSIS — E039 Hypothyroidism, unspecified: Secondary | ICD-10-CM | POA: Diagnosis present

## 2024-03-27 DIAGNOSIS — G4733 Obstructive sleep apnea (adult) (pediatric): Secondary | ICD-10-CM | POA: Diagnosis present

## 2024-03-27 DIAGNOSIS — M797 Fibromyalgia: Secondary | ICD-10-CM | POA: Diagnosis present

## 2024-03-27 DIAGNOSIS — Z8546 Personal history of malignant neoplasm of prostate: Secondary | ICD-10-CM

## 2024-03-27 DIAGNOSIS — I358 Other nonrheumatic aortic valve disorders: Secondary | ICD-10-CM | POA: Diagnosis present

## 2024-03-27 DIAGNOSIS — G8929 Other chronic pain: Secondary | ICD-10-CM | POA: Diagnosis present

## 2024-03-27 DIAGNOSIS — Z79899 Other long term (current) drug therapy: Secondary | ICD-10-CM

## 2024-03-27 DIAGNOSIS — Z96651 Presence of right artificial knee joint: Secondary | ICD-10-CM | POA: Diagnosis present

## 2024-03-27 DIAGNOSIS — K219 Gastro-esophageal reflux disease without esophagitis: Secondary | ICD-10-CM | POA: Diagnosis present

## 2024-03-27 DIAGNOSIS — E785 Hyperlipidemia, unspecified: Secondary | ICD-10-CM | POA: Diagnosis present

## 2024-03-27 DIAGNOSIS — E1140 Type 2 diabetes mellitus with diabetic neuropathy, unspecified: Secondary | ICD-10-CM | POA: Diagnosis present

## 2024-03-27 DIAGNOSIS — Z91040 Latex allergy status: Secondary | ICD-10-CM

## 2024-03-27 DIAGNOSIS — E119 Type 2 diabetes mellitus without complications: Secondary | ICD-10-CM

## 2024-03-27 DIAGNOSIS — Z7901 Long term (current) use of anticoagulants: Secondary | ICD-10-CM

## 2024-03-27 DIAGNOSIS — Z86718 Personal history of other venous thrombosis and embolism: Secondary | ICD-10-CM | POA: Diagnosis not present

## 2024-03-27 DIAGNOSIS — Y792 Prosthetic and other implants, materials and accessory orthopedic devices associated with adverse incidents: Secondary | ICD-10-CM | POA: Diagnosis present

## 2024-03-27 DIAGNOSIS — Z8673 Personal history of transient ischemic attack (TIA), and cerebral infarction without residual deficits: Secondary | ICD-10-CM | POA: Diagnosis not present

## 2024-03-27 DIAGNOSIS — T84033A Mechanical loosening of internal left knee prosthetic joint, initial encounter: Principal | ICD-10-CM | POA: Diagnosis present

## 2024-03-27 DIAGNOSIS — D62 Acute posthemorrhagic anemia: Secondary | ICD-10-CM | POA: Diagnosis not present

## 2024-03-27 DIAGNOSIS — Z8249 Family history of ischemic heart disease and other diseases of the circulatory system: Secondary | ICD-10-CM | POA: Diagnosis not present

## 2024-03-27 DIAGNOSIS — J45909 Unspecified asthma, uncomplicated: Secondary | ICD-10-CM | POA: Diagnosis present

## 2024-03-27 DIAGNOSIS — E876 Hypokalemia: Principal | ICD-10-CM

## 2024-03-27 DIAGNOSIS — Z79891 Long term (current) use of opiate analgesic: Secondary | ICD-10-CM

## 2024-03-27 DIAGNOSIS — Z888 Allergy status to other drugs, medicaments and biological substances status: Secondary | ICD-10-CM

## 2024-03-27 DIAGNOSIS — Z7985 Long-term (current) use of injectable non-insulin antidiabetic drugs: Secondary | ICD-10-CM

## 2024-03-27 DIAGNOSIS — Z91048 Other nonmedicinal substance allergy status: Secondary | ICD-10-CM

## 2024-03-27 DIAGNOSIS — I129 Hypertensive chronic kidney disease with stage 1 through stage 4 chronic kidney disease, or unspecified chronic kidney disease: Secondary | ICD-10-CM | POA: Diagnosis present

## 2024-03-27 DIAGNOSIS — Z9682 Presence of neurostimulator: Secondary | ICD-10-CM

## 2024-03-27 DIAGNOSIS — Z96652 Presence of left artificial knee joint: Secondary | ICD-10-CM

## 2024-03-27 HISTORY — PX: REIMPLANTATION OF TOTAL KNEE: SHX6052

## 2024-03-27 LAB — BASIC METABOLIC PANEL WITH GFR
Anion gap: 12 (ref 5–15)
BUN: 17 mg/dL (ref 8–23)
CO2: 31 mmol/L (ref 22–32)
Calcium: 9.7 mg/dL (ref 8.9–10.3)
Chloride: 90 mmol/L — ABNORMAL LOW (ref 98–111)
Creatinine, Ser: 1.06 mg/dL (ref 0.61–1.24)
GFR, Estimated: >60 mL/min (ref >60)
Glucose, Bld: 127 mg/dL — ABNORMAL HIGH (ref 70–99)
Potassium: 3.5 mmol/L (ref 3.5–5.1)
Sodium: 133 mmol/L — ABNORMAL LOW (ref 135–145)

## 2024-03-27 LAB — GLUCOSE, CAPILLARY
Glucose-Capillary: 130 mg/dL — ABNORMAL HIGH (ref 70–99)
Glucose-Capillary: 167 mg/dL — ABNORMAL HIGH (ref 70–99)
Glucose-Capillary: 163 mg/dL — ABNORMAL HIGH (ref 70–99)

## 2024-03-27 SURGERY — REVISION, TOTAL ARTHROPLASTY, KNEE
Anesthesia: General | Site: Knee | Laterality: Left

## 2024-03-27 MED ORDER — LACTATED RINGERS IV SOLN: INTRAVENOUS | Status: DC

## 2024-03-27 MED ORDER — SODIUM CHLORIDE 0.9 % IV SOLN: INTRAVENOUS | Status: DC

## 2024-03-27 MED ORDER — PROPOFOL 10 MG/ML IV BOLUS
INTRAVENOUS | Status: DC | PRN
  Administered 2024-03-27: 120 mg via INTRAVENOUS

## 2024-03-27 MED ORDER — ACETAMINOPHEN 325 MG PO TABS: 325.0000 mg | ORAL_TABLET | Freq: Four times a day (QID) | ORAL | Status: DC | PRN

## 2024-03-27 MED ORDER — CEFAZOLIN SODIUM-DEXTROSE 2-4 GM/100ML-% IV SOLN
2.0000 g | Freq: Four times a day (QID) | INTRAVENOUS | Status: AC
  Administered 2024-03-27 – 2024-03-28 (×2): 2 g via INTRAVENOUS
  Filled 2024-03-27 (×2): qty 100

## 2024-03-27 MED ORDER — DEXAMETHASONE SODIUM PHOSPHATE 10 MG/ML IJ SOLN
INTRAMUSCULAR | Status: DC | PRN
  Administered 2024-03-27: 8 mg via INTRAVENOUS

## 2024-03-27 MED ORDER — ISOPROPYL ALCOHOL 70 % SOLN
Status: AC
  Filled 2024-03-27: qty 480

## 2024-03-27 MED ORDER — PHENOL 1.4 % MT LIQD: 1.0000 | OROMUCOSAL | Status: DC | PRN

## 2024-03-27 MED ORDER — ADULT MULTIVITAMIN W/MINERALS CH
1.0000 | ORAL_TABLET | Freq: Every day | ORAL | Status: DC
  Administered 2024-03-28: 1 via ORAL
  Filled 2024-03-27: qty 1

## 2024-03-27 MED ORDER — TRAZODONE HCL 50 MG PO TABS
150.0000 mg | ORAL_TABLET | Freq: Every day | ORAL | Status: DC
  Administered 2024-03-27: 150 mg via ORAL
  Filled 2024-03-27: qty 1

## 2024-03-27 MED ORDER — FENTANYL CITRATE PF 50 MCG/ML IJ SOSY
25.0000 ug | PREFILLED_SYRINGE | INTRAMUSCULAR | Status: DC | PRN
  Administered 2024-03-27: 25 ug via INTRAVENOUS

## 2024-03-27 MED ORDER — ONDANSETRON HCL 4 MG/2ML IJ SOLN
INTRAMUSCULAR | Status: AC
  Filled 2024-03-27: qty 2

## 2024-03-27 MED ORDER — ALUM & MAG HYDROXIDE-SIMETH 200-200-20 MG/5ML PO SUSP: 30.0000 mL | ORAL | Status: DC | PRN

## 2024-03-27 MED ORDER — POLYETHYLENE GLYCOL 3350 17 G PO PACK: 17.0000 g | PACK | Freq: Every day | ORAL | Status: DC | PRN

## 2024-03-27 MED ORDER — DOCUSATE SODIUM 100 MG PO CAPS
100.0000 mg | ORAL_CAPSULE | Freq: Two times a day (BID) | ORAL | Status: DC
  Administered 2024-03-27 – 2024-03-28 (×2): 100 mg via ORAL
  Filled 2024-03-27 (×2): qty 1

## 2024-03-27 MED ORDER — ONDANSETRON HCL 4 MG/2ML IJ SOLN: 4.0000 mg | Freq: Four times a day (QID) | INTRAMUSCULAR | Status: DC | PRN

## 2024-03-27 MED ORDER — POVIDONE-IODINE 10 % EX SWAB: 2.0000 | Freq: Once | CUTANEOUS | Status: DC

## 2024-03-27 MED ORDER — PROPOFOL 10 MG/ML IV BOLUS
INTRAVENOUS | Status: AC
  Filled 2024-03-27: qty 20

## 2024-03-27 MED ORDER — CEFAZOLIN SODIUM-DEXTROSE 2-4 GM/100ML-% IV SOLN
2.0000 g | INTRAVENOUS | Status: AC
  Administered 2024-03-27: 2 g via INTRAVENOUS
  Filled 2024-03-27: qty 100

## 2024-03-27 MED ORDER — ROPIVACAINE HCL 5 MG/ML IJ SOLN
INTRAMUSCULAR | Status: DC | PRN
  Administered 2024-03-27: 25 mL via PERINEURAL

## 2024-03-27 MED ORDER — 0.9 % SODIUM CHLORIDE (POUR BTL) OPTIME
TOPICAL | Status: DC | PRN
  Administered 2024-03-27: 1000 mL

## 2024-03-27 MED ORDER — LIDOCAINE HCL (PF) 2 % IJ SOLN
INTRAMUSCULAR | Status: AC
  Filled 2024-03-27: qty 5

## 2024-03-27 MED ORDER — ROCURONIUM BROMIDE 10 MG/ML (PF) SYRINGE
PREFILLED_SYRINGE | INTRAVENOUS | Status: AC
  Filled 2024-03-27: qty 10

## 2024-03-27 MED ORDER — APIXABAN 2.5 MG PO TABS
2.5000 mg | ORAL_TABLET | Freq: Two times a day (BID) | ORAL | Status: DC
  Administered 2024-03-28: 2.5 mg via ORAL
  Filled 2024-03-27: qty 1

## 2024-03-27 MED ORDER — INSULIN ASPART 100 UNIT/ML IJ SOLN: 0.0000 [IU] | Freq: Every day | INTRAMUSCULAR | Status: DC

## 2024-03-27 MED ORDER — KETOROLAC TROMETHAMINE 30 MG/ML IJ SOLN
INTRAMUSCULAR | Status: AC
  Filled 2024-03-27: qty 1

## 2024-03-27 MED ORDER — PHENYLEPHRINE 80 MCG/ML (10ML) SYRINGE FOR IV PUSH (FOR BLOOD PRESSURE SUPPORT)
PREFILLED_SYRINGE | INTRAVENOUS | Status: AC
  Filled 2024-03-27: qty 10

## 2024-03-27 MED ORDER — LEVOTHYROXINE SODIUM 100 MCG PO TABS
100.0000 ug | ORAL_TABLET | Freq: Every day | ORAL | Status: DC
  Administered 2024-03-28: 100 ug via ORAL
  Filled 2024-03-27: qty 1

## 2024-03-27 MED ORDER — PROPOFOL 1000 MG/100ML IV EMUL
INTRAVENOUS | Status: AC
  Filled 2024-03-27: qty 100

## 2024-03-27 MED ORDER — METHOCARBAMOL 500 MG PO TABS
500.0000 mg | ORAL_TABLET | Freq: Four times a day (QID) | ORAL | Status: DC | PRN
  Administered 2024-03-28: 500 mg via ORAL
  Filled 2024-03-27: qty 1

## 2024-03-27 MED ORDER — ONDANSETRON HCL 4 MG/2ML IJ SOLN
INTRAMUSCULAR | Status: DC | PRN
  Administered 2024-03-27: 4 mg via INTRAVENOUS

## 2024-03-27 MED ORDER — OXYCODONE HCL 5 MG PO TABS
5.0000 mg | ORAL_TABLET | ORAL | Status: DC | PRN
  Administered 2024-03-28: 10 mg via ORAL
  Filled 2024-03-27: qty 2

## 2024-03-27 MED ORDER — ACETAMINOPHEN 500 MG PO TABS
1000.0000 mg | ORAL_TABLET | Freq: Four times a day (QID) | ORAL | Status: AC
  Administered 2024-03-27 – 2024-03-28 (×4): 1000 mg via ORAL
  Filled 2024-03-27 (×4): qty 2

## 2024-03-27 MED ORDER — INSULIN ASPART 100 UNIT/ML IJ SOLN
0.0000 [IU] | Freq: Three times a day (TID) | INTRAMUSCULAR | Status: DC
  Administered 2024-03-28: 2 [IU] via SUBCUTANEOUS
  Administered 2024-03-28: 1 [IU] via SUBCUTANEOUS

## 2024-03-27 MED ORDER — LORATADINE 10 MG PO TABS
10.0000 mg | ORAL_TABLET | Freq: Every day | ORAL | Status: DC
  Administered 2024-03-27: 10 mg via ORAL
  Filled 2024-03-27 (×2): qty 1

## 2024-03-27 MED ORDER — FENTANYL CITRATE PF 50 MCG/ML IJ SOSY
PREFILLED_SYRINGE | INTRAMUSCULAR | Status: AC
  Filled 2024-03-27: qty 1

## 2024-03-27 MED ORDER — KETAMINE HCL 50 MG/5ML IJ SOSY
PREFILLED_SYRINGE | INTRAMUSCULAR | Status: AC
  Filled 2024-03-27: qty 5

## 2024-03-27 MED ORDER — KETAMINE HCL 50 MG/5ML IJ SOSY
PREFILLED_SYRINGE | INTRAMUSCULAR | Status: DC | PRN
  Administered 2024-03-27 (×2): 20 mg via INTRAVENOUS

## 2024-03-27 MED ORDER — SENNA 8.6 MG PO TABS
1.0000 | ORAL_TABLET | Freq: Two times a day (BID) | ORAL | Status: DC
  Administered 2024-03-27 – 2024-03-28 (×2): 8.6 mg via ORAL
  Filled 2024-03-27 (×2): qty 1

## 2024-03-27 MED ORDER — GLYCOPYRROLATE 0.2 MG/ML IJ SOLN
INTRAMUSCULAR | Status: AC
  Filled 2024-03-27: qty 1

## 2024-03-27 MED ORDER — EPHEDRINE 5 MG/ML INJ
INTRAVENOUS | Status: AC
  Filled 2024-03-27: qty 5

## 2024-03-27 MED ORDER — ALBUTEROL SULFATE (2.5 MG/3ML) 0.083% IN NEBU: 3.0000 mL | INHALATION_SOLUTION | Freq: Four times a day (QID) | RESPIRATORY_TRACT | Status: DC | PRN

## 2024-03-27 MED ORDER — ORAL CARE MOUTH RINSE: 15.0000 mL | Freq: Once | OROMUCOSAL | Status: AC

## 2024-03-27 MED ORDER — METOCLOPRAMIDE HCL 5 MG/ML IJ SOLN: 5.0000 mg | Freq: Three times a day (TID) | INTRAMUSCULAR | Status: DC | PRN

## 2024-03-27 MED ORDER — CHLORHEXIDINE GLUCONATE 0.12 % MT SOLN
15.0000 mL | Freq: Once | OROMUCOSAL | Status: AC
  Administered 2024-03-27: 15 mL via OROMUCOSAL

## 2024-03-27 MED ORDER — ISOPROPYL ALCOHOL 70 % SOLN
Status: DC | PRN
  Administered 2024-03-27: 1 via TOPICAL

## 2024-03-27 MED ORDER — ALBUMIN HUMAN 5 % IV SOLN: INTRAVENOUS | Status: DC | PRN

## 2024-03-27 MED ORDER — OXYCODONE HCL 5 MG PO TABS: 5.0000 mg | ORAL_TABLET | Freq: Once | ORAL | Status: DC | PRN

## 2024-03-27 MED ORDER — BUPIVACAINE-EPINEPHRINE (PF) 0.25% -1:200000 IJ SOLN
INTRAMUSCULAR | Status: AC
  Filled 2024-03-27: qty 30

## 2024-03-27 MED ORDER — FENTANYL CITRATE PF 50 MCG/ML IJ SOSY
100.0000 ug | PREFILLED_SYRINGE | INTRAMUSCULAR | Status: DC
  Administered 2024-03-27: 50 ug via INTRAVENOUS
  Filled 2024-03-27: qty 2

## 2024-03-27 MED ORDER — PANTOPRAZOLE SODIUM 40 MG PO TBEC
40.0000 mg | DELAYED_RELEASE_TABLET | Freq: Every day | ORAL | Status: DC
  Administered 2024-03-28: 40 mg via ORAL
  Filled 2024-03-27: qty 1

## 2024-03-27 MED ORDER — FENTANYL CITRATE (PF) 100 MCG/2ML IJ SOLN
INTRAMUSCULAR | Status: AC
  Filled 2024-03-27: qty 2

## 2024-03-27 MED ORDER — DIPHENHYDRAMINE HCL 12.5 MG/5ML PO ELIX: 12.5000 mg | ORAL_SOLUTION | ORAL | Status: DC | PRN

## 2024-03-27 MED ORDER — MONTELUKAST SODIUM 10 MG PO TABS
10.0000 mg | ORAL_TABLET | Freq: Every day | ORAL | Status: DC
  Administered 2024-03-27: 10 mg via ORAL
  Filled 2024-03-27: qty 1

## 2024-03-27 MED ORDER — OXYCODONE HCL 5 MG PO TABS
10.0000 mg | ORAL_TABLET | ORAL | Status: DC | PRN
  Administered 2024-03-27 – 2024-03-28 (×3): 15 mg via ORAL
  Filled 2024-03-27 (×3): qty 3

## 2024-03-27 MED ORDER — MENTHOL 3 MG MT LOZG
1.0000 | LOZENGE | OROMUCOSAL | Status: DC | PRN
  Administered 2024-03-27: 3 mg via ORAL
  Filled 2024-03-27: qty 9

## 2024-03-27 MED ORDER — HYDROMORPHONE HCL 1 MG/ML IJ SOLN
0.5000 mg | INTRAMUSCULAR | Status: DC | PRN
  Administered 2024-03-27 – 2024-03-28 (×2): 1 mg via INTRAVENOUS
  Filled 2024-03-27 (×2): qty 1

## 2024-03-27 MED ORDER — MIDAZOLAM HCL 2 MG/2ML IJ SOLN
2.0000 mg | INTRAMUSCULAR | Status: DC
  Administered 2024-03-27: 2 mg via INTRAVENOUS
  Filled 2024-03-27: qty 2

## 2024-03-27 MED ORDER — BISACODYL 10 MG RE SUPP: 10.0000 mg | Freq: Every day | RECTAL | Status: DC | PRN

## 2024-03-27 MED ORDER — HYDROMORPHONE HCL 1 MG/ML IJ SOLN
INTRAMUSCULAR | Status: DC | PRN
  Administered 2024-03-27: .2 mg via INTRAVENOUS
  Administered 2024-03-27 (×2): .4 mg via INTRAVENOUS
  Administered 2024-03-27: .2 mg via INTRAVENOUS

## 2024-03-27 MED ORDER — OXYCODONE HCL 5 MG/5ML PO SOLN: 5.0000 mg | Freq: Once | ORAL | Status: DC | PRN

## 2024-03-27 MED ORDER — SUGAMMADEX SODIUM 200 MG/2ML IV SOLN
INTRAVENOUS | Status: DC | PRN
  Administered 2024-03-27: 100 mg via INTRAVENOUS

## 2024-03-27 MED ORDER — RANOLAZINE ER 500 MG PO TB12
500.0000 mg | ORAL_TABLET | Freq: Two times a day (BID) | ORAL | Status: DC
  Administered 2024-03-27 – 2024-03-28 (×2): 500 mg via ORAL
  Filled 2024-03-27 (×2): qty 1

## 2024-03-27 MED ORDER — METHOCARBAMOL 1000 MG/10ML IJ SOLN: 500.0000 mg | Freq: Four times a day (QID) | INTRAMUSCULAR | Status: DC | PRN

## 2024-03-27 MED ORDER — METOCLOPRAMIDE HCL 5 MG PO TABS: 5.0000 mg | ORAL_TABLET | Freq: Three times a day (TID) | ORAL | Status: DC | PRN

## 2024-03-27 MED ORDER — AMLODIPINE BESYLATE 5 MG PO TABS
5.0000 mg | ORAL_TABLET | Freq: Every day | ORAL | Status: DC
  Administered 2024-03-27: 5 mg via ORAL
  Filled 2024-03-27 (×2): qty 1

## 2024-03-27 MED ORDER — PHENYLEPHRINE HCL-NACL 20-0.9 MG/250ML-% IV SOLN
INTRAVENOUS | Status: DC | PRN
  Administered 2024-03-27: 40 ug/min via INTRAVENOUS

## 2024-03-27 MED ORDER — ROCURONIUM BROMIDE 10 MG/ML (PF) SYRINGE
PREFILLED_SYRINGE | INTRAVENOUS | Status: DC | PRN
  Administered 2024-03-27: 50 mg via INTRAVENOUS

## 2024-03-27 MED ORDER — DEXAMETHASONE SODIUM PHOSPHATE 10 MG/ML IJ SOLN
INTRAMUSCULAR | Status: AC
  Filled 2024-03-27: qty 1

## 2024-03-27 MED ORDER — TRANEXAMIC ACID-NACL 1000-0.7 MG/100ML-% IV SOLN
1000.0000 mg | INTRAVENOUS | Status: AC
  Administered 2024-03-27: 1000 mg via INTRAVENOUS
  Filled 2024-03-27: qty 100

## 2024-03-27 MED ORDER — HYDROMORPHONE HCL 2 MG/ML IJ SOLN
INTRAMUSCULAR | Status: AC
  Filled 2024-03-27: qty 1

## 2024-03-27 MED ORDER — EVOLOCUMAB 140 MG/ML ~~LOC~~ SOAJ: 140.0000 mg | SUBCUTANEOUS | Status: DC

## 2024-03-27 MED ORDER — DEXAMETHASONE SODIUM PHOSPHATE 10 MG/ML IJ SOLN
10.0000 mg | Freq: Once | INTRAMUSCULAR | Status: AC
  Administered 2024-03-28: 10 mg via INTRAVENOUS
  Filled 2024-03-27: qty 1

## 2024-03-27 MED ORDER — SODIUM CHLORIDE (PF) 0.9 % IJ SOLN
INTRAMUSCULAR | Status: AC
  Filled 2024-03-27: qty 30

## 2024-03-27 MED ORDER — ALBUMIN HUMAN 5 % IV SOLN
INTRAVENOUS | Status: AC
  Filled 2024-03-27: qty 250

## 2024-03-27 MED ORDER — EPHEDRINE SULFATE-NACL 50-0.9 MG/10ML-% IV SOSY
PREFILLED_SYRINGE | INTRAVENOUS | Status: DC | PRN
  Administered 2024-03-27: 2.5 mg via INTRAVENOUS
  Administered 2024-03-27: 10 mg via INTRAVENOUS
  Administered 2024-03-27: 2.5 mg via INTRAVENOUS
  Administered 2024-03-27: 10 mg via INTRAVENOUS

## 2024-03-27 MED ORDER — PHENYLEPHRINE 80 MCG/ML (10ML) SYRINGE FOR IV PUSH (FOR BLOOD PRESSURE SUPPORT)
PREFILLED_SYRINGE | INTRAVENOUS | Status: DC | PRN
  Administered 2024-03-27 (×4): 80 ug via INTRAVENOUS

## 2024-03-27 MED ORDER — FENTANYL CITRATE (PF) 100 MCG/2ML IJ SOLN
INTRAMUSCULAR | Status: DC | PRN
  Administered 2024-03-27: 100 ug via INTRAVENOUS

## 2024-03-27 MED ORDER — LIDOCAINE HCL (PF) 2 % IJ SOLN
INTRAMUSCULAR | Status: DC | PRN
  Administered 2024-03-27: 40 mg via INTRADERMAL

## 2024-03-27 MED ORDER — SODIUM CHLORIDE 0.9 % IR SOLN
Status: DC | PRN
  Administered 2024-03-27: 3000 mL

## 2024-03-27 MED ORDER — ONDANSETRON HCL 4 MG PO TABS: 4.0000 mg | ORAL_TABLET | Freq: Four times a day (QID) | ORAL | Status: DC | PRN

## 2024-03-27 MED ORDER — GLYCOPYRROLATE PF 0.2 MG/ML IJ SOSY
PREFILLED_SYRINGE | INTRAMUSCULAR | Status: DC | PRN
  Administered 2024-03-27: .2 mg via INTRAVENOUS

## 2024-03-27 MED ORDER — ACETAMINOPHEN 500 MG PO TABS
1000.0000 mg | ORAL_TABLET | Freq: Once | ORAL | Status: AC
  Administered 2024-03-27: 1000 mg via ORAL
  Filled 2024-03-27: qty 2

## 2024-03-27 SURGICAL SUPPLY — 67 items
BAG COUNTER SPONGE SURGICOUNT (BAG) IMPLANT
BAG ZIPLOCK 12X15 (MISCELLANEOUS) ×2 IMPLANT
BLADE OSTEOTOME VER FLAT 11X50 (ORTHOPEDIC DISPOSABLE SUPPLIES) IMPLANT
BLADE OSTEOTOME VER RD 11X100 (ORTHOPEDIC DISPOSABLE SUPPLIES) IMPLANT
BLADE SAW SGTL 81X20 HD (BLADE) ×2 IMPLANT
BLADE SURG SZ10 CARB STEEL (BLADE) ×2 IMPLANT
BNDG ELASTIC 4INX 5YD STR LF (GAUZE/BANDAGES/DRESSINGS) ×2 IMPLANT
BNDG ELASTIC 6INX 5YD STR LF (GAUZE/BANDAGES/DRESSINGS) ×2 IMPLANT
CEMENT BONE REFOBACIN R1X40 US (Cement) IMPLANT
CHLORAPREP W/TINT 26 (MISCELLANEOUS) ×4 IMPLANT
COMPONENT FEM TI MET KNEE 9 (Knees) IMPLANT
COMPONENT TIB KNEE SZ E UNC (Knees) IMPLANT
CONE TIB CENTRAL TM MED (Joint) IMPLANT
COVER SURGICAL LIGHT HANDLE (MISCELLANEOUS) ×2 IMPLANT
CUFF TRNQT CYL 34X4.125X (TOURNIQUET CUFF) ×2 IMPLANT
DERMABOND ADVANCED .7 DNX12 (GAUZE/BANDAGES/DRESSINGS) ×4 IMPLANT
DRAPE INCISE IOBAN 66X45 STRL (DRAPES) IMPLANT
DRAPE SHEET LG 3/4 BI-LAMINATE (DRAPES) ×6 IMPLANT
DRAPE U-SHAPE 47X51 STRL (DRAPES) ×2 IMPLANT
DRSG AQUACEL AG ADV 3.5X10 (GAUZE/BANDAGES/DRESSINGS) IMPLANT
DRSG AQUACEL AG ADV 3.5X14 (GAUZE/BANDAGES/DRESSINGS) IMPLANT
DRSG TEGADERM 4X4.75 (GAUZE/BANDAGES/DRESSINGS) IMPLANT
ELECT BLADE TIP CTD 4 INCH (ELECTRODE) ×2 IMPLANT
ELECT PENCIL ROCKER SW 15FT (MISCELLANEOUS) ×2 IMPLANT
ELECT REM PT RETURN 15FT ADLT (MISCELLANEOUS) ×2 IMPLANT
EVACUATOR 1/8 PVC DRAIN (DRAIN) IMPLANT
GAUZE SPONGE 4X4 12PLY STRL (GAUZE/BANDAGES/DRESSINGS) IMPLANT
GLOVE BIO SURGEON STRL SZ7 (GLOVE) ×4 IMPLANT
GLOVE BIO SURGEON STRL SZ8.5 (GLOVE) ×4 IMPLANT
GLOVE BIOGEL PI IND STRL 7.5 (GLOVE) ×2 IMPLANT
GLOVE BIOGEL PI IND STRL 8.5 (GLOVE) ×2 IMPLANT
GOWN SPEC L3 XXLG W/TWL (GOWN DISPOSABLE) ×2 IMPLANT
GOWN STRL REUS W/ TWL XL LVL3 (GOWN DISPOSABLE) ×2 IMPLANT
HOLDER FOLEY CATH W/STRAP (MISCELLANEOUS) IMPLANT
HOOD PEEL AWAY T7 (MISCELLANEOUS) ×6 IMPLANT
INSERT ASF VE PS 14 6-9/EF LT (Insert) IMPLANT
INSTRUMENT SCRW HEX REV 3.5X48 (ORTHOPEDIC DISPOSABLE SUPPLIES) IMPLANT
KIT TURNOVER KIT A (KITS) ×2 IMPLANT
MANIFOLD NEPTUNE II (INSTRUMENTS) ×2 IMPLANT
MARKER SKIN DUAL TIP RULER LAB (MISCELLANEOUS) ×2 IMPLANT
NS IRRIG 1000ML POUR BTL (IV SOLUTION) ×2 IMPLANT
PACK TOTAL KNEE CUSTOM (KITS) ×2 IMPLANT
PADDING CAST COTTON 6X4 STRL (CAST SUPPLIES) ×2 IMPLANT
PIN DRILL HDLS TROCAR 75 4PK (PIN) IMPLANT
PROTECTOR NERVE ULNAR (MISCELLANEOUS) ×2 IMPLANT
SAW OSC TIP CART 19.5X105X1.3 (SAW) ×2 IMPLANT
SCREW HEX HEADED 3.5X27 DISP (ORTHOPEDIC DISPOSABLE SUPPLIES) IMPLANT
SEALER BIPOLAR AQUA 6.0 (INSTRUMENTS) ×2 IMPLANT
SET HNDPC FAN SPRY TIP SCT (DISPOSABLE) ×2 IMPLANT
SET PAD KNEE POSITIONER (MISCELLANEOUS) ×2 IMPLANT
SOLUTION PRONTOSAN WOUND 350ML (IRRIGATION / IRRIGATOR) ×2 IMPLANT
SPONGE DRAIN TRACH 4X4 STRL 2S (GAUZE/BANDAGES/DRESSINGS) IMPLANT
SPONGE T-LAP 18X18 ~~LOC~~+RFID (SPONGE) IMPLANT
STAPLER SKIN PROX 35W (STAPLE) IMPLANT
STEM FEM 135L 14D 3 (Stem) IMPLANT
STEM STRT SPLINE PS 16X135 (Stem) IMPLANT
SUT MON AB 2-0 CT1 36 (SUTURE) ×4 IMPLANT
SUT STRATAFIX 14 PDO 48 VLT (SUTURE) ×2 IMPLANT
SUT VIC AB 1 CTX36XBRD ANBCTR (SUTURE) ×4 IMPLANT
SUT VIC AB 2-0 CT1 TAPERPNT 27 (SUTURE) ×2 IMPLANT
TOWEL GREEN STERILE FF (TOWEL DISPOSABLE) ×2 IMPLANT
TOWER CARTRIDGE SMART MIX (DISPOSABLE) ×4 IMPLANT
TRAY FOLEY MTR SLVR 16FR STAT (SET/KITS/TRAYS/PACK) ×2 IMPLANT
TUBE SUCTION HIGH CAP CLEAR NV (SUCTIONS) ×2 IMPLANT
USAGE FEE IMPLANT
WATER STERILE IRR 1000ML POUR (IV SOLUTION) ×2 IMPLANT
WRAP KNEE MAXI GEL POST OP (GAUZE/BANDAGES/DRESSINGS) ×2 IMPLANT

## 2024-03-27 NOTE — H&P (Signed)
 TOTAL KNEE REVISION ADMISSION H&P  Patient is being admitted for left revision total knee arthroplasty.  Subjective:  Chief Complaint:left knee pain.  HPI: Joel Kim, 78 y.o. male, has a history of pain and functional disability in the left knee(s) due to failed previous arthroplasty and patient has failed non-surgical conservative treatments for greater than 12 weeks to include NSAID's and/or analgesics, flexibility and strengthening excercises, supervised PT with diminished ADL's post treatment, use of assistive devices, and activity modification. The indications for the revision of the total knee arthroplasty are loosening of one or more components. Onset of symptoms was gradual starting 2 years ago with rapidlly worsening course since that time.  Prior procedures on the left knee(s) include arthroplasty.  Patient currently rates pain in the left knee(s) at 10 out of 10 with activity. There is night pain, worsening of pain with activity and weight bearing, pain that interferes with activities of daily living, pain with passive range of motion, and joint swelling.  Patient has evidence of prosthetic loosening by imaging studies. This condition presents safety issues increasing the risk of falls.  There is no current active infection.  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   Hypokalemia 01/25/2022   Acute anemia 01/25/2022   Acute metabolic encephalopathy 01/25/2022   Osteoarthritis of left knee 01/19/2022   Atypical chest pain 08/15/2018   Hyperlipidemia 08/15/2018   Family history of heart disease 08/15/2018   Hypertension 05/31/2018   Hypothyroidism 05/31/2018   Acute pancreatitis 05/31/2018   Mild renal insufficiency 05/31/2018   Past Medical History:   Diagnosis Date   Anemia    Arthritis    Asthma    Atypical nevus 01/26/2006   right side of body (slight)   Basal cell carcinoma 11/17/2015   left ear rim tx cx3 68fu   Cancer (HCC)    Coronary artery disease    Diabetes mellitus without complication (HCC)    Fibromyalgia    GERD (gastroesophageal reflux disease)    Heart murmur    History of kidney stones    Hypertension    Hypothyroidism    Insomnia    Intractable migraine without aura    Melanoma (HCC) 10/10/2014   right ant. shoulder (exc)   Neuromuscular disorder (HCC)    feet   Pancreatitis    Pneumonia    PONV (postoperative nausea and vomiting)    Pre-diabetes    Prostate CA (HCC)    Sleep apnea    hx of no longer has per   Spondylosis of lumbar spine    Stroke (HCC) 2021   TIA x 2 some memory loss.   Thyroid disease     Past Surgical History:  Procedure Laterality Date   BACK SURGERY  2013   L4-L5 and a redo in 2014 after a fall   CHOLECYSTECTOMY  1992   FOOT SURGERY Left 1999   IVC FILTER INSERTION N/A 03/18/2024   Procedure: IVC FILTER INSERTION;  Surgeon: Sheree Penne Bruckner, MD;  Location: [facility] INVASIVE CV LAB;  Service: Cardiovascular;  Laterality: N/A;   MOHS SURGERY     PROSTATECTOMY  2008   ROTATOR CUFF REPAIR Left 2021   SPINAL CORD STIMULATOR IMPLANT  2020   TOTAL KNEE ARTHROPLASTY Right 2005   TOTAL KNEE ARTHROPLASTY  Left 01/19/2022   Procedure: TOTAL KNEE ARTHROPLASTY;  Surgeon: Gerome Charleston, MD;  Location: [facility];  Service: Orthopedics;  Laterality: Left;  adductor canal 120    Current Outpatient Medications  Medication Sig Dispense Refill Last Dose/Taking   albuterol  (PROVENTIL  HFA;VENTOLIN  HFA) 108 (90 Base) MCG/ACT inhaler Inhale 2 puffs into the lungs every 6 (six) hours as needed. (Patient taking differently: Inhale 2 puffs into the lungs every 6 (six) hours as needed for wheezing or shortness of breath.) 1 Inhaler 0    amLODipine  (NORVASC ) 5 MG tablet Take 5 mg by mouth daily.       Ascorbic Acid (VITAMIN C GUMMIE PO) Take 1 tablet by mouth in the morning.      aspirin  EC 81 MG tablet Take 81 mg by mouth at bedtime.      cetirizine (ZYRTEC) 10 MG tablet Take 10 mg by mouth at bedtime.      chlorthalidone (HYGROTON) 25 MG tablet Take 25 mg by mouth daily.      clotrimazole-betamethasone (LOTRISONE) cream Apply 1 Application topically 2 (two) times daily as needed (skin irritation.).      levothyroxine  (SYNTHROID ) 100 MCG tablet Take 100 mcg by mouth daily before breakfast.      lisinopril (ZESTRIL) 10 MG tablet Take 10 mg by mouth in the morning.      montelukast (SINGULAIR) 10 MG tablet Take 10 mg by mouth at bedtime.      Multiple Vitamin (MULTIVITAMIN WITH MINERALS) TABS tablet Take 1 tablet by mouth in the morning.      mupirocin ointment (BACTROBAN) 2 % Apply 1 Application topically 3 (three) times daily as needed (WOUND CARE).      Oxycodone  HCl 10 MG TABS Take 10 mg by mouth every 6 (six) hours as needed (breakthrough pain.).      pantoprazole  (PROTONIX ) 40 MG tablet Take 1 tablet (40 mg total) by mouth daily. 30 tablet 3    polyethylene glycol (MIRALAX  / GLYCOLAX ) 17 g packet Take 17 g by mouth daily as needed for mild constipation. 14 each 0    ranolazine  (RANEXA ) 500 MG 12 hr tablet Take 500 mg by mouth 2 (two) times daily.      REPATHA SURECLICK 140 MG/ML SOAJ Inject 140 mg into the skin every 14 (fourteen) days. Wednesday      tirzepatide (MOUNJARO) 2.5 MG/0.5ML Pen Inject 2.5 mg into the skin every Friday.      traZODone  (DESYREL ) 150 MG tablet Take 150 mg by mouth at bedtime.      Vitamin D-Vitamin K (D3 + K2 PO) Take 1 tablet by mouth in the morning.      XTAMPZA  ER 9 MG C12A Take 9 mg by mouth 2 (two) times daily.      No current facility-administered medications for this visit.   Allergies  Allergen Reactions   Reclast [Zoledronic Acid] Other (See Comments)    Ended up in hospital/ high fever and    Tapentadol Rash and Other (See Comments)     Breaks patient out   Nucynta    Paroxetine Hcl Other (See Comments)    Irritability    Statins Other (See Comments)    Muscle aches   Adhesive [Tape]     Breaks patient out    Ciprofloxacin Hives   Mannitol Other (See Comments)    Unknown   Flagyl [Metronidazole] Rash   Latex Rash    Irritated skin    Social History   Tobacco Use  Smoking status: Never   Smokeless tobacco: Never  Substance Use Topics   Alcohol use: No    No family history on file.    Review of Systems  Musculoskeletal:  Positive for arthralgias, gait problem and joint swelling.  All other systems reviewed and are negative.    Objective:  Physical Exam Constitutional:      Appearance: Normal appearance.  HENT:     Head: Normocephalic and atraumatic.     Nose: Nose normal.     Mouth/Throat:     Mouth: Mucous membranes are moist.     Pharynx: Oropharynx is clear.  Eyes:     Conjunctiva/sclera: Conjunctivae normal.  Cardiovascular:     Rate and Rhythm: Normal rate and regular rhythm.     Pulses: Normal pulses.     Heart sounds: Normal heart sounds.  Pulmonary:     Effort: Pulmonary effort is normal.     Breath sounds: Normal breath sounds.  Abdominal:     General: Abdomen is flat.     Palpations: Abdomen is soft.  Genitourinary:    Comments: deferred Musculoskeletal:     Cervical back: Normal range of motion and neck supple.     Comments: Examination of the left knee reveals a healed anterior incision. He has swelling. No erythema or effusion. No warmth. He has tenderness to palpation over the tibial component, especially the far medial side. Range of motion is 0 to 120 degrees. In full extension and 30 degrees of flexion, there is no varus/valgus instability. He does have laxity in flexion. No extensor lag. Painless range of motion of the hip.  Distally, there is no focal motor or sensory deficit. He has palpable pedal pulses. Edema in LE bilaterally.    Skin:    General: Skin is warm and  dry.     Capillary Refill: Capillary refill takes less than 2 seconds.  Neurological:     General: No focal deficit present.     Mental Status: He is alert and oriented to person, place, and time.  Psychiatric:        Mood and Affect: Mood normal.        Behavior: Behavior normal.        Thought Content: Thought content normal.        Judgment: Judgment normal.     Vital signs in last 24 hours: @VSRANGES @  Labs:  Estimated body mass index is 27.46 kg/m as calculated from the following:   Height as of 03/18/24: 5' 5 (1.651 m).   Weight as of 03/18/24: 74.8 kg.  Imaging Review Plain radiographs demonstrate severe degenerative joint disease of the left knee(s). The overall alignment is neutral.There is evidence of loosening of the femoral and tibial components. The bone quality appears to be adequate for age and reported activity level.     Assessment/Plan:  End stage arthritis, left knee(s) with failed previous arthroplasty.   The patient history, physical examination, clinical judgment of the provider and imaging studies are consistent with end stage degenerative joint disease of the left knee(s), previous total knee arthroplasty. Revision total knee arthroplasty is deemed medically necessary. The treatment options including medical management, injection therapy, arthroscopy and revision arthroplasty were discussed at length. The risks and benefits of revision total knee arthroplasty were presented and reviewed. The risks due to aseptic loosening, infection, stiffness, patella tracking problems, thromboembolic complications and other imponderables were discussed. The patient acknowledged the explanation, agreed to proceed with the plan and consent was signed. Patient  is being admitted for inpatient treatment for surgery, pain control, PT, OT, prophylactic antibiotics, VTE prophylaxis, progressive ambulation and ADL's and discharge planning.The patient is planning to be discharged home  with OPPT after an overnight stay.   Therapy Plans: outpatient therapy. PT [facility] 04/01/24.  Disposition: Home with wife Planned DVT Prophylaxis: Eliquis  2.5mg  BID DME needed: Has rolling walker. Has ice machine.  PCP: Cleared.  Cardiology: Cleared.  TXA: IV Allergies:  - Ciprofloxacin - hives - Latex - rash - Mannitol -unknown - Metronidazole - hives. - Paxil - irritable.  - Statins - myalgias - Tapentadol - rash - zinc oxide- white petrolatum - rash - zolendronic acid - confusion, fever, hallucinations. Anesthesia Concerns: History of nausea and vomiting.  BMI: 29.5 Last HgbA1c: 5.4 Other: - Surgery postponed due to HTN, that is now resolved.  - OSA history. New sleep study mild.  - CKD - T2DM, neuropathy. - Mounjaro, hold 14 days prior to surgery  - Provoked DVT hx, IVC filter, 03/18/24.  - TIA history, aspirin  baseline. Hold 5 days prior to surgery and per Dr. Sheree recommendations.  - Chronic low back pain. Oxycodone  10 mg every 6 hours baseline.  - Oxycodone , zofran , no NSAIDs.  - Labs pending.

## 2024-03-27 NOTE — H&P (View-Only) (Signed)
 TOTAL KNEE REVISION ADMISSION H&P  Patient is being admitted for left revision total knee arthroplasty.  Subjective:  Chief Complaint:left knee pain.  HPI: Joel Kim, 78 y.o. male, has a history of pain and functional disability in the left knee(s) due to failed previous arthroplasty and patient has failed non-surgical conservative treatments for greater than 12 weeks to include NSAID's and/or analgesics, flexibility and strengthening excercises, supervised PT with diminished ADL's post treatment, use of assistive devices, and activity modification. The indications for the revision of the total knee arthroplasty are loosening of one or more components. Onset of symptoms was gradual starting 2 years ago with rapidlly worsening course since that time.  Prior procedures on the left knee(s) include arthroplasty.  Patient currently rates pain in the left knee(s) at 10 out of 10 with activity. There is night pain, worsening of pain with activity and weight bearing, pain that interferes with activities of daily living, pain with passive range of motion, and joint swelling.  Patient has evidence of prosthetic loosening by imaging studies. This condition presents safety issues increasing the risk of falls.  There is no current active infection.  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   Hypokalemia 01/25/2022   Acute anemia 01/25/2022   Acute metabolic encephalopathy 01/25/2022   Osteoarthritis of left knee 01/19/2022   Atypical chest pain 08/15/2018   Hyperlipidemia 08/15/2018   Family history of heart disease 08/15/2018   Hypertension 05/31/2018   Hypothyroidism 05/31/2018   Acute pancreatitis 05/31/2018   Mild renal insufficiency 05/31/2018   Past Medical History:   Diagnosis Date   Anemia    Arthritis    Asthma    Atypical nevus 01/26/2006   right side of body (slight)   Basal cell carcinoma 11/17/2015   left ear rim tx cx3 68fu   Cancer (HCC)    Coronary artery disease    Diabetes mellitus without complication (HCC)    Fibromyalgia    GERD (gastroesophageal reflux disease)    Heart murmur    History of kidney stones    Hypertension    Hypothyroidism    Insomnia    Intractable migraine without aura    Melanoma (HCC) 10/10/2014   right ant. shoulder (exc)   Neuromuscular disorder (HCC)    feet   Pancreatitis    Pneumonia    PONV (postoperative nausea and vomiting)    Pre-diabetes    Prostate CA (HCC)    Sleep apnea    hx of no longer has per   Spondylosis of lumbar spine    Stroke (HCC) 2021   TIA x 2 some memory loss.   Thyroid disease     Past Surgical History:  Procedure Laterality Date   BACK SURGERY  2013   L4-L5 and a redo in 2014 after a fall   CHOLECYSTECTOMY  1992   FOOT SURGERY Left 1999   IVC FILTER INSERTION N/A 03/18/2024   Procedure: IVC FILTER INSERTION;  Surgeon: Sheree Penne Bruckner, MD;  Location: [facility] INVASIVE CV LAB;  Service: Cardiovascular;  Laterality: N/A;   MOHS SURGERY     PROSTATECTOMY  2008   ROTATOR CUFF REPAIR Left 2021   SPINAL CORD STIMULATOR IMPLANT  2020   TOTAL KNEE ARTHROPLASTY Right 2005   TOTAL KNEE ARTHROPLASTY  Left 01/19/2022   Procedure: TOTAL KNEE ARTHROPLASTY;  Surgeon: Gerome Charleston, MD;  Location: [facility];  Service: Orthopedics;  Laterality: Left;  adductor canal 120    Current Outpatient Medications  Medication Sig Dispense Refill Last Dose/Taking   albuterol  (PROVENTIL  HFA;VENTOLIN  HFA) 108 (90 Base) MCG/ACT inhaler Inhale 2 puffs into the lungs every 6 (six) hours as needed. (Patient taking differently: Inhale 2 puffs into the lungs every 6 (six) hours as needed for wheezing or shortness of breath.) 1 Inhaler 0    amLODipine  (NORVASC ) 5 MG tablet Take 5 mg by mouth daily.       Ascorbic Acid (VITAMIN C GUMMIE PO) Take 1 tablet by mouth in the morning.      aspirin  EC 81 MG tablet Take 81 mg by mouth at bedtime.      cetirizine (ZYRTEC) 10 MG tablet Take 10 mg by mouth at bedtime.      chlorthalidone (HYGROTON) 25 MG tablet Take 25 mg by mouth daily.      clotrimazole-betamethasone (LOTRISONE) cream Apply 1 Application topically 2 (two) times daily as needed (skin irritation.).      levothyroxine  (SYNTHROID ) 100 MCG tablet Take 100 mcg by mouth daily before breakfast.      lisinopril (ZESTRIL) 10 MG tablet Take 10 mg by mouth in the morning.      montelukast (SINGULAIR) 10 MG tablet Take 10 mg by mouth at bedtime.      Multiple Vitamin (MULTIVITAMIN WITH MINERALS) TABS tablet Take 1 tablet by mouth in the morning.      mupirocin ointment (BACTROBAN) 2 % Apply 1 Application topically 3 (three) times daily as needed (WOUND CARE).      Oxycodone  HCl 10 MG TABS Take 10 mg by mouth every 6 (six) hours as needed (breakthrough pain.).      pantoprazole  (PROTONIX ) 40 MG tablet Take 1 tablet (40 mg total) by mouth daily. 30 tablet 3    polyethylene glycol (MIRALAX  / GLYCOLAX ) 17 g packet Take 17 g by mouth daily as needed for mild constipation. 14 each 0    ranolazine  (RANEXA ) 500 MG 12 hr tablet Take 500 mg by mouth 2 (two) times daily.      REPATHA SURECLICK 140 MG/ML SOAJ Inject 140 mg into the skin every 14 (fourteen) days. Wednesday      tirzepatide (MOUNJARO) 2.5 MG/0.5ML Pen Inject 2.5 mg into the skin every Friday.      traZODone  (DESYREL ) 150 MG tablet Take 150 mg by mouth at bedtime.      Vitamin D-Vitamin K (D3 + K2 PO) Take 1 tablet by mouth in the morning.      XTAMPZA  ER 9 MG C12A Take 9 mg by mouth 2 (two) times daily.      No current facility-administered medications for this visit.   Allergies  Allergen Reactions   Reclast [Zoledronic Acid] Other (See Comments)    Ended up in hospital/ high fever and    Tapentadol Rash and Other (See Comments)     Breaks patient out   Nucynta    Paroxetine Hcl Other (See Comments)    Irritability    Statins Other (See Comments)    Muscle aches   Adhesive [Tape]     Breaks patient out    Ciprofloxacin Hives   Mannitol Other (See Comments)    Unknown   Flagyl [Metronidazole] Rash   Latex Rash    Irritated skin    Social History   Tobacco Use  Smoking status: Never   Smokeless tobacco: Never  Substance Use Topics   Alcohol use: No    No family history on file.    Review of Systems  Musculoskeletal:  Positive for arthralgias, gait problem and joint swelling.  All other systems reviewed and are negative.    Objective:  Physical Exam Constitutional:      Appearance: Normal appearance.  HENT:     Head: Normocephalic and atraumatic.     Nose: Nose normal.     Mouth/Throat:     Mouth: Mucous membranes are moist.     Pharynx: Oropharynx is clear.  Eyes:     Conjunctiva/sclera: Conjunctivae normal.  Cardiovascular:     Rate and Rhythm: Normal rate and regular rhythm.     Pulses: Normal pulses.     Heart sounds: Normal heart sounds.  Pulmonary:     Effort: Pulmonary effort is normal.     Breath sounds: Normal breath sounds.  Abdominal:     General: Abdomen is flat.     Palpations: Abdomen is soft.  Genitourinary:    Comments: deferred Musculoskeletal:     Cervical back: Normal range of motion and neck supple.     Comments: Examination of the left knee reveals a healed anterior incision. He has swelling. No erythema or effusion. No warmth. He has tenderness to palpation over the tibial component, especially the far medial side. Range of motion is 0 to 120 degrees. In full extension and 30 degrees of flexion, there is no varus/valgus instability. He does have laxity in flexion. No extensor lag. Painless range of motion of the hip.  Distally, there is no focal motor or sensory deficit. He has palpable pedal pulses. Edema in LE bilaterally.    Skin:    General: Skin is warm and  dry.     Capillary Refill: Capillary refill takes less than 2 seconds.  Neurological:     General: No focal deficit present.     Mental Status: He is alert and oriented to person, place, and time.  Psychiatric:        Mood and Affect: Mood normal.        Behavior: Behavior normal.        Thought Content: Thought content normal.        Judgment: Judgment normal.     Vital signs in last 24 hours: @VSRANGES @  Labs:  Estimated body mass index is 27.46 kg/m as calculated from the following:   Height as of 03/18/24: 5' 5 (1.651 m).   Weight as of 03/18/24: 74.8 kg.  Imaging Review Plain radiographs demonstrate severe degenerative joint disease of the left knee(s). The overall alignment is neutral.There is evidence of loosening of the femoral and tibial components. The bone quality appears to be adequate for age and reported activity level.     Assessment/Plan:  End stage arthritis, left knee(s) with failed previous arthroplasty.   The patient history, physical examination, clinical judgment of the provider and imaging studies are consistent with end stage degenerative joint disease of the left knee(s), previous total knee arthroplasty. Revision total knee arthroplasty is deemed medically necessary. The treatment options including medical management, injection therapy, arthroscopy and revision arthroplasty were discussed at length. The risks and benefits of revision total knee arthroplasty were presented and reviewed. The risks due to aseptic loosening, infection, stiffness, patella tracking problems, thromboembolic complications and other imponderables were discussed. The patient acknowledged the explanation, agreed to proceed with the plan and consent was signed. Patient  is being admitted for inpatient treatment for surgery, pain control, PT, OT, prophylactic antibiotics, VTE prophylaxis, progressive ambulation and ADL's and discharge planning.The patient is planning to be discharged home  with OPPT after an overnight stay.   Therapy Plans: outpatient therapy. PT [facility] 04/01/24.  Disposition: Home with wife Planned DVT Prophylaxis: Eliquis  2.5mg  BID DME needed: Has rolling walker. Has ice machine.  PCP: Cleared.  Cardiology: Cleared.  TXA: IV Allergies:  - Ciprofloxacin - hives - Latex - rash - Mannitol -unknown - Metronidazole - hives. - Paxil - irritable.  - Statins - myalgias - Tapentadol - rash - zinc oxide- white petrolatum - rash - zolendronic acid - confusion, fever, hallucinations. Anesthesia Concerns: History of nausea and vomiting.  BMI: 29.5 Last HgbA1c: 5.4 Other: - Surgery postponed due to HTN, that is now resolved.  - OSA history. New sleep study mild.  - CKD - T2DM, neuropathy. - Mounjaro, hold 14 days prior to surgery  - Provoked DVT hx, IVC filter, 03/18/24.  - TIA history, aspirin  baseline. Hold 5 days prior to surgery and per Dr. Sheree recommendations.  - Chronic low back pain. Oxycodone  10 mg every 6 hours baseline.  - Oxycodone , zofran , no NSAIDs.  - Labs pending.

## 2024-03-27 NOTE — Interval H&P Note (Signed)
 History and Physical Interval Note:  03/27/2024 11:52 AM  Joel Kim  has presented today for surgery, with the diagnosis of Failed left total knee arthroplasty.  The various methods of treatment have been discussed with the patient and family. After consideration of risks, benefits and other options for treatment, the patient has consented to  Procedure(s): REVISION, TOTAL ARTHROPLASTY, KNEE (Left) as a surgical intervention.  The patient's history has been reviewed, patient examined, no change in status, stable for surgery.  I have reviewed the patient's chart and labs.  Questions were answered to the patient's satisfaction.     [name] Micheal Murad

## 2024-03-27 NOTE — Anesthesia Procedure Notes (Signed)
 Anesthesia Regional Block: Adductor canal block   Pre-Anesthetic Checklist: , timeout performed,  Correct Patient, Correct Site, Correct Laterality,  Correct Procedure, Correct Position, site marked,  Risks and benefits discussed,  Surgical consent,  Pre-op evaluation,  At surgeon's request and post-op pain management  Laterality: Left  Prep: chloraprep       Needles:  Injection technique: Single-shot  Needle Type: Echogenic Needle     Needle Length: 9cm  Needle Gauge: 21     Additional Needles:   Narrative:  Start time: 03/27/2024 10:40 AM End time: 03/27/2024 10:46 AM Injection made incrementally with aspirations every 5 mL.  Performed by: Personally  Anesthesiologist: Maryclare Cornet, MD  Additional Notes: Pt tolerated the procedure well.

## 2024-03-27 NOTE — Anesthesia Procedure Notes (Signed)
 Procedure Name: Intubation Date/Time: 03/27/2024 12:15 PM  Performed by: Memory Armida LABOR, CRNAPre-anesthesia Checklist: Patient identified, Emergency Drugs available, Suction available, Patient being monitored and Timeout performed Patient Re-evaluated:Patient Re-evaluated prior to induction Oxygen  Delivery Method: Circle system utilized Preoxygenation: Pre-oxygenation with 100% oxygen  Induction Type: IV induction Ventilation: Mask ventilation without difficulty Laryngoscope Size: Mac and 4 Grade View: Grade I Tube type: Oral Tube size: 7.5 mm Number of attempts: 1 Airway Equipment and Method: Stylet Placement Confirmation: ETT inserted through vocal cords under direct vision, breath sounds checked- equal and bilateral and positive ETCO2 Secured at: 21 cm Tube secured with: Tape Dental Injury: Teeth and Oropharynx as per pre-operative assessment

## 2024-03-27 NOTE — Op Note (Signed)
 OPERATIVE REPORT   03/27/2024  3:55 PM  PATIENT:  Joel Kim   SURGEON:  Redell JINNY Shoals, MD  ASSISTANT:  Valery Potters, PA-C.   PREOPERATIVE DIAGNOSIS:  Failed left total knee arthroplasty.  POSTOPERATIVE DIAGNOSIS:  Same.  PROCEDURE: Revision left total knee arthroplasty, femoral and tibial components.  ANESTHESIA:   GETA.  ANTIBIOTICS:  2g Ancef.  EXPLANTS: DePuy Attune PS femur size 5, RP tibial tray size 6, 8 mm PS RP poly insert.  IMPLANTS:  Zimmer Persona revision femur size 9+ with 16 x 135 mm straight splined stem. Persona revision tibia size E with 14 x 135 mm splined stem, 3 mm offset. Trabecular metal tibial central cone size medium. Vivacit-E poly liner, size 14 mm CPS. Refobacin bone cement.  SPECIMENS: None.  COMPLICATIONS: None.  DISPOSITION: Stable to PACU.  SURGICAL INDICATIONS:  Joel Kim is a 78 y.o. male who underwent primary left total knee arthroplasty on 01/19/2022.  He developed progressive pain in the left knee and I saw him as a second opinion.  He had normal sed rate and C-reactive protein.  Triple phase bone scan revealed increased uptake of the lateral femoral condyle and under the tibial component, confirming aseptic loosening.  He was indicated for revision left total knee arthroplasty.  Due to history of left lower extremity DVT, he had placement of temporary IVC filter by Dr. Sheree.  We discussed the risks, benefits, and alternatives to revision left total knee arthroplasty, and he elected to proceed.  PROCEDURE IN DETAIL: The patient was identified in the holding area using 2 identifiers.  The surgical site was marked by myself.  He was taken to the operating room, placed supine on the operating table.  General anesthesia was induced.  All bony prominences were well-padded.  A nonsterile tourniquet was applied to the left upper thigh, but was not utilized.  The left lower extremity was prepped and draped in the normal sterile surgical  fashion.  Timeout was called, verifying side and site of surgery.  Using a #10 blade, I sharply excised his previous anterior skin incision.  Full-thickness skin flaps were created.  Medial parapatellar arthrotomy was performed.  Medial release was obtained.  Knee was brought into full extension.  Bovie electrocautery was utilized to excise infrapatellar scar.  I performed a radical synovectomy of the medial gutter, lateral gutter, suprapatellar pouch.  I examined the patellar component, which was well-fixed.  Using an ACL saw and versa drive pneumatic osteotome, I loosened the implant cement interface of the femoral component followed by the tibial component.  Using the [facility] knee extractor, I removed the femoral component without any difficulty.  There was no bone loss.  Polyliner was then removed by hand.  Using the [facility] extractor, the tibial component was removed without any difficulty.  There was no significant bone loss.  Previous cement mantle was removed with osteotomes and Moreland instruments.  I sequentially reamed up to a size 14 mm reamer on the tibia and 16 mm reamer on the femur.  Proximal tibia cleanup cut was made using intramedullary cutting guide.  The proximal tibia was sized and rotation was established, and I determined 3 mm of offset was needed.  The proximal tibial surface was prepared.  I prepared the tibia for a size medium cone.  The trial tibial component was assembled and placed into the tibia.  I then turned my attention of the femur.  16 mm reamer was replaced, distal cleanup cut was performed.  4-in-1 distal cutting block was positioned while tensioning the flexion gap.  Distal cuts were performed and the trial femoral component was assembled.  The trial component was inserted and then the box was cut.  Trial box component was applied.  14 mm CPS trial liner was inserted.  The knee had excellent varus/valgus stability throughout the range of motion.  The flexion and extension  gaps were perfectly balanced.  The patella tracks centrally using no thumbs technique.  All trial components were then removed.  The cut bony surfaces were irrigated with normal saline using pulsatile lavage and dried with lap sponges.  The real components were assembled on the back table.  The tibial cone was placed.  The tibial and femoral components were inserted using hybrid fixation technique.  Trial poly was placed, the knee was brought into extension while the cement polymerized.  All excess cement was cleared.  Once the cement was fully hardened, the trial liner was exchanged for the real liner.  Repeat stability testing was performed and found to be unchanged.  The knee was then irrigated with Prontosan and normal saline using pulse lavage.  Meticulous hemostasis was achieved with the aqua mantis.  The arthrotomy was closed with #1 Vicryl with #1 strata fix.  Deep fatty tissue was closed with 2-0 Vicryl, deep dermal layer was closed with 2-0 Monocryl, and skin was reapproximated with staples.  Dermabond was applied to the skin.  Once the glue was fully hardened, Aquacel Ag dressing was applied.  Bulky dressing was then applied.  Patient was then awakened from anesthesia and taken to the PACU in stable condition.  Sponge, needle, and counts were correct in the case x 2.  There were no known complications.

## 2024-03-27 NOTE — Discharge Instructions (Addendum)
 Dr. Redell Shoals Total Joint Specialist [facility] [address], [address] [place], [place] [address] [phone]  TOTAL KNEE REPLACEMENT POSTOPERATIVE DIRECTIONS    Knee Rehabilitation, Guidelines Following Surgery  Results after knee surgery are often greatly improved when you follow the exercise, range of motion and muscle strengthening exercises prescribed by your doctor. Safety measures are also important to protect the knee from further injury. Any time any of these exercises cause you to have increased pain or swelling in your knee joint, decrease the amount until you are comfortable again and slowly increase them. If you have problems or questions, call your caregiver or physical therapist for advice.   WEIGHT BEARING Weight bearing as tolerated with assist device (walker, cane, etc) as directed, use it as long as suggested by your surgeon or therapist, typically at least 4-6 weeks.  HOME CARE INSTRUCTIONS  Remove items at home which could result in a fall. This includes throw rugs or furniture in walking pathways.  Continue medications as instructed at time of discharge. You may have some home medications which will be placed on hold until you complete the course of blood thinner medication.  You may start showering once you are discharged home but do not submerge the incision under water. Just pat the incision dry and apply a dry gauze dressing on daily. Walk with walker as instructed.  You may resume a sexual relationship in one month or when given the OK by your doctor.  Use walker as long as suggested by your caregivers. Avoid periods of inactivity such as sitting longer than an hour when not asleep. This helps prevent blood clots.  You may put full weight on your legs and walk as much as is comfortable.  You may return to work once you are cleared by your doctor.  Do not drive a car for 6 weeks or until released by you surgeon.  Do not drive while  taking narcotics.  Wear the elastic stockings for three weeks following surgery during the day but you may remove then at night. Make sure you keep all of your appointments after your operation with all of your doctors and caregivers. You should call the office at the above phone number and make an appointment for approximately two weeks after the date of your surgery. Do not remove your surgical dressing. The dressing is waterproof; you may take showers in 3 days, but do not take tub baths or submerge the dressing. Please pick up a stool softener and laxative for home use as long as you are requiring pain medications. ICE to the affected knee every three hours for 30 minutes at a time and then as needed for pain and swelling.  Continue to use ice on the knee for pain and swelling from surgery. You may notice swelling that will progress down to the foot and ankle.  This is normal after surgery.  Elevate the leg when you are not up walking on it.   It is important for you to complete the blood thinner medication as prescribed by your doctor. Continue to use the breathing machine which will help keep your temperature down.  It is common for your temperature to cycle up and down following surgery, especially at night when you are not up moving around and exerting yourself.  The breathing machine keeps your lungs expanded and your temperature down.  RANGE OF MOTION AND STRENGTHENING EXERCISES  Rehabilitation of the knee is important following a knee injury or an  operation. After just a few days of immobilization, the muscles of the thigh which control the knee become weakened and shrink (atrophy). Knee exercises are designed to build up the tone and strength of the thigh muscles and to improve knee motion. Often times heat used for twenty to thirty minutes before working out will loosen up your tissues and help with improving the range of motion but do not use heat for the first two weeks following surgery.  These exercises can be done on a training (exercise) mat, on the floor, on a table or on a bed. Use what ever works the best and is most comfortable for you Knee exercises include:  Leg Lifts - While your knee is still immobilized in a splint or cast, you can do straight leg raises. Lift the leg to 60 degrees, hold for 3 sec, and slowly lower the leg. Repeat 10-20 times 2-3 times daily. Perform this exercise against resistance later as your knee gets better.  Quad and Hamstring Sets - Tighten up the muscle on the front of the thigh (Quad) and hold for 5-10 sec. Repeat this 10-20 times hourly. Hamstring sets are done by pushing the foot backward against an object and holding for 5-10 sec. Repeat as with quad sets.  A rehabilitation program following serious knee injuries can speed recovery and prevent re-injury in the future due to weakened muscles. Contact your doctor or a physical therapist for more information on knee rehabilitation.   POST-OPERATIVE OPIOID TAPER INSTRUCTIONS: It is important to wean off of your opioid medication as soon as possible. If you do not need pain medication after your surgery it is ok to stop day one. Opioids include: Codeine, Hydrocodone (Norco, Vicodin), Oxycodone (Percocet, oxycontin ) and hydromorphone  amongst others.  Long term and even short term use of opiods can cause: Increased pain response Dependence Constipation Depression Respiratory depression And more.  Withdrawal symptoms can include Flu like symptoms Nausea, vomiting And more Techniques to manage these symptoms Hydrate well Eat regular healthy meals Stay active Use relaxation techniques(deep breathing, meditating, yoga) Do Not substitute Alcohol to help with tapering If you have been on opioids for less than two weeks and do not have pain than it is ok to stop all together.  Plan to wean off of opioids This plan should start within one week post op of your joint replacement. Maintain the same  interval or time between taking each dose and first decrease the dose.  Cut the total daily intake of opioids by one tablet each day Next start to increase the time between doses. The last dose that should be eliminated is the evening dose.    SKILLED REHAB INSTRUCTIONS: If the patient is transferred to a skilled rehab facility following release from the hospital, a list of the current medications will be sent to the facility for the patient to continue.  When discharged from the skilled rehab facility, please have the facility set up the patient's Home Health Physical Therapy prior to being released. Also, the skilled facility will be responsible for providing the patient with their medications at time of release from the facility to include their pain medication, the muscle relaxants, and their blood thinner medication. If the patient is still at the rehab facility at time of the two week follow up appointment, the skilled rehab facility will also need to assist the patient in arranging follow up appointment in our office and any transportation needs.  MAKE SURE YOU:  Understand these instructions.  Will watch  your condition.  Will get help right away if you are not doing well or get worse.    Pick up stool softner and laxative for home use following surgery while on pain medications. Do NOT remove your dressing. You may shower.  Do not take tub baths or submerge incision under water. May shower starting three days after surgery. Please use a clean towel to pat the incision dry following showers.Information on my medicine - ELIQUIS  (apixaban )  This medication education was reviewed with me or my healthcare representative as part of my discharge preparation.   Why was Eliquis  prescribed for you? Eliquis  was prescribed for you to reduce the risk of blood clots forming after orthopedic surgery.    What do You need to know about Eliquis ? Take your Eliquis  TWICE DAILY - one tablet in the  morning and one tablet in the evening with or without food.  It would be best to take the dose about the same time each day.  If you have difficulty swallowing the tablet whole please discuss with your pharmacist how to take the medication safely.  Take Eliquis  exactly as prescribed by your doctor and DO NOT stop taking Eliquis  without talking to the doctor who prescribed the medication.  Stopping without other medication to take the place of Eliquis  may increase your risk of developing a clot.  After discharge, you should have regular check-up appointments with your healthcare provider that is prescribing your Eliquis .  What do you do if you miss a dose? If a dose of ELIQUIS  is not taken at the scheduled time, take it as soon as possible on the same day and twice-daily administration should be resumed.  The dose should not be doubled to make up for a missed dose.  Do not take more than one tablet of ELIQUIS  at the same time.  Important Safety Information A possible side effect of Eliquis  is bleeding. You should call your healthcare provider right away if you experience any of the following: Bleeding from an injury or your nose that does not stop. Unusual colored urine (red or dark brown) or unusual colored stools (red or black). Unusual bruising for unknown reasons. A serious fall or if you hit your head (even if there is no bleeding).  Some medicines may interact with Eliquis  and might increase your risk of bleeding or clotting while on Eliquis . To help avoid this, consult your healthcare provider or pharmacist prior to using any new prescription or non-prescription medications, including herbals, vitamins, non-steroidal anti-inflammatory drugs (NSAIDs) and supplements.  This website has more information on Eliquis  (apixaban ): http://[email or web address] .com/eliquis /home Continue to use ice for pain and swelling after surgery. Do not use any lotions or creams on the incision until instructed  by your surgeon.

## 2024-03-27 NOTE — Transfer of Care (Signed)
 Immediate Anesthesia Transfer of Care Note  Patient: Joel Kim  Procedure(s) Performed: REVISION, TOTAL ARTHROPLASTY, KNEE (Left: Knee)  Patient Location: PACU  Anesthesia Type:General  Level of Consciousness: awake, alert , oriented, and patient cooperative  Airway & Oxygen  Therapy: Patient Spontanous Breathing and Patient connected to face mask oxygen   Post-op Assessment: Report given to RN and Post -op Vital signs reviewed and stable  Post vital signs: Reviewed and stable  Last Vitals:  Vitals Value Taken Time  BP 112/71 03/27/24 16:45  Temp    Pulse 84 03/27/24 16:47  Resp 10 03/27/24 16:47  SpO2 100 % 03/27/24 16:47  Vitals shown include unfiled device data.  Last Pain:  Vitals:   03/27/24 1025  TempSrc: Oral         Complications: No notable events documented.

## 2024-03-28 ENCOUNTER — Encounter (HOSPITAL_COMMUNITY): Payer: Self-pay | Admitting: Orthopedic Surgery

## 2024-03-28 ENCOUNTER — Other Ambulatory Visit (HOSPITAL_COMMUNITY): Payer: Self-pay

## 2024-03-28 ENCOUNTER — Other Ambulatory Visit: Payer: Self-pay

## 2024-03-28 LAB — CBC
HCT: 27.3 % — ABNORMAL LOW (ref 39.0–52.0)
Hemoglobin: 9.5 g/dL — ABNORMAL LOW (ref 13.0–17.0)
MCH: 31.9 pg (ref 26.0–34.0)
MCHC: 34.8 g/dL (ref 30.0–36.0)
MCV: 91.6 fL (ref 80.0–100.0)
Platelets: 242 10*3/uL (ref 150–400)
RBC: 2.98 MIL/uL — ABNORMAL LOW (ref 4.22–5.81)
RDW: 13.1 % (ref 11.5–15.5)
WBC: 17 10*3/uL — ABNORMAL HIGH (ref 4.0–10.5)
nRBC: 0 % (ref 0.0–0.2)

## 2024-03-28 LAB — BASIC METABOLIC PANEL WITH GFR
Anion gap: 11 (ref 5–15)
BUN: 14 mg/dL (ref 8–23)
CO2: 24 mmol/L (ref 22–32)
Calcium: 8.5 mg/dL — ABNORMAL LOW (ref 8.9–10.3)
Chloride: 96 mmol/L — ABNORMAL LOW (ref 98–111)
Creatinine, Ser: 1.16 mg/dL (ref 0.61–1.24)
GFR, Estimated: >60 mL/min (ref >60)
Glucose, Bld: 182 mg/dL — ABNORMAL HIGH (ref 70–99)
Potassium: 3.5 mmol/L (ref 3.5–5.1)
Sodium: 131 mmol/L — ABNORMAL LOW (ref 135–145)

## 2024-03-28 LAB — GLUCOSE, CAPILLARY
Glucose-Capillary: 148 mg/dL — ABNORMAL HIGH (ref 70–99)
Glucose-Capillary: 170 mg/dL — ABNORMAL HIGH (ref 70–99)

## 2024-03-28 MED ORDER — OXYCODONE HCL 5 MG PO TABS
10.0000 mg | ORAL_TABLET | ORAL | 0 refills | Status: AC | PRN
  Filled 2024-03-28: qty 56, 5d supply, fill #0

## 2024-03-28 MED ORDER — VANCOMYCIN HCL IN DEXTROSE 1-5 GM/200ML-% IV SOLN
1000.0000 mg | Freq: Once | INTRAVENOUS | Status: AC
  Administered 2024-03-28: 1000 mg via INTRAVENOUS
  Filled 2024-03-28: qty 200

## 2024-03-28 MED ORDER — DOCUSATE SODIUM 100 MG PO CAPS
100.0000 mg | ORAL_CAPSULE | Freq: Two times a day (BID) | ORAL | 0 refills | Status: AC
  Filled 2024-03-28: qty 60, 30d supply, fill #0

## 2024-03-28 MED ORDER — POLYETHYLENE GLYCOL 3350 17 GM/SCOOP PO POWD
17.0000 g | Freq: Every day | ORAL | 0 refills | Status: AC | PRN
  Filled 2024-03-28: qty 238, 14d supply, fill #0

## 2024-03-28 MED ORDER — APIXABAN 2.5 MG PO TABS
2.5000 mg | ORAL_TABLET | Freq: Two times a day (BID) | ORAL | 0 refills | Status: AC
  Filled 2024-03-28: qty 60, 30d supply, fill #0

## 2024-03-28 MED ORDER — ONDANSETRON HCL 4 MG PO TABS
4.0000 mg | ORAL_TABLET | Freq: Three times a day (TID) | ORAL | 0 refills | Status: AC | PRN
  Filled 2024-03-28: qty 30, 10d supply, fill #0

## 2024-03-28 MED ORDER — SENNA 8.6 MG PO TABS
2.0000 | ORAL_TABLET | Freq: Every day | ORAL | 0 refills | Status: AC
  Filled 2024-03-28: qty 30, 15d supply, fill #0

## 2024-03-28 NOTE — Progress Notes (Signed)
    Subjective:  Patient reports pain as mild to moderate.  Denies N/V/CP/SOB/abd pain. He reports he is doing well this morning.  Wife at bedside. He has been voiding without difficulty.  He is hopeful for d/c home today.  Discussed parameters to discharge.   Objective:   VITALS:   Vitals:   03/27/24 2124 03/28/24 0315 03/28/24 0316 03/28/24 0655  BP: 101/67 (!) 104/59  104/63  Pulse:  96 96 96  Resp: 16 15  16   Temp: (!) 97.5 F (36.4 C) 98 F (36.7 C)  98 F (36.7 C)  TempSrc:      SpO2: 93% 91% 92% 93%  Weight:      Height:        NAD Neurologically intact ABD soft Neurovascular intact Sensation intact distally Intact pulses distally Dorsiflexion/Plantar flexion intact Incision: dressing C/D/I No cellulitis present Compartment soft   Lab Results  Component Value Date   WBC 17.0 (H) 03/28/2024   HGB 9.5 (L) 03/28/2024   HCT 27.3 (L) 03/28/2024   MCV 91.6 03/28/2024   PLT 242 03/28/2024   BMET    Component Value Date/Time   NA 131 (L) 03/28/2024 0342   K 3.5 03/28/2024 0342   CL 96 (L) 03/28/2024 0342   CO2 24 03/28/2024 0342   GLUCOSE 182 (H) 03/28/2024 0342   BUN 14 03/28/2024 0342   CREATININE 1.16 03/28/2024 0342   CALCIUM 8.5 (L) 03/28/2024 0342   GFRNONAA >60 03/28/2024 0342     Assessment/Plan: 1 Day Post-Op   Principal Problem:   S/P revision of total knee, left  ABLA. Hemoglobin 9.5. Continue to monitor.   WBAT with walker DVT ppx: Eliquis , SCDs, TEDS PO pain control PT/OT: to come today.  Dispo:  - D/c home with OPPT once cleared with PT and pain controlled.    Valery GORMAN Potters 03/28/2024, 7:36 AM   [facility]  [place] [address], [place], [place] [address] Phone: [phone] [email or web address] Facebook  [employer]

## 2024-03-28 NOTE — Progress Notes (Signed)
 Physical Therapy Treatment Patient Details Name: Joel Kim MRN: [id number] DOB: 08-26-1946 Today's Date: 03/28/2024   History of Present Illness Pt s/p L TKR revision and with hx of DM, fibromyalgia, pancreatitis, TIA x 2 with memory loss, prostate CA, back surgery, spinal cord stimulator, and bil TKR    PT Comments  Pt continues motivated and progressing well with mobility.  Pt up to ambulate in hall, negotiated stairs, and performed HEP with written instruction provided and reviewed.  Pt eager for dc home this date.    If plan is discharge home, recommend the following: A little help with walking and/or transfers;A little help with bathing/dressing/bathroom;Assistance with cooking/housework;Assist for transportation;Help with stairs or ramp for entrance   Can travel by private vehicle        Equipment Recommendations  None recommended by PT    Recommendations for Other Services       Precautions / Restrictions Precautions Precautions: Fall;Knee Restrictions Weight Bearing Restrictions Per Provider Order: Yes LLE Weight Bearing Per Provider Order: Weight bearing as tolerated     Mobility  Bed Mobility Overal bed mobility: Needs Assistance Bed Mobility: Supine to Sit     Supine to sit: Min assist     General bed mobility comments: Pt up in chair and returns to same    Transfers Overall transfer level: Needs assistance Equipment used: Rolling walker (2 wheels) Transfers: Sit to/from Stand Sit to Stand: Contact guard assist, Supervision           General transfer comment: cues for LE management and use of UEs to self assist    Ambulation/Gait Ambulation/Gait assistance: Contact guard assist, Supervision Gait Distance (Feet): 75 Feet Assistive device: Rolling walker (2 wheels) Gait Pattern/deviations: Step-to pattern, Decreased step length - right, Decreased step length - left, Shuffle, Trunk flexed Gait velocity: decr     General Gait Details: cues for  sequence, posture and position from RW   Stairs Stairs: Yes Stairs assistance: Min assist Stair Management: One rail Left, Step to pattern, Forwards, With cane Number of Stairs: 5 General stair comments: 2+3 stairs; cues for sequence and foot/cane placement   Wheelchair Mobility     Tilt Bed    Modified Rankin (Stroke Patients Only)       Balance Overall balance assessment: Needs assistance Sitting-balance support: Feet supported, No upper extremity supported Sitting balance-Leahy Scale: Good     Standing balance support: Single extremity supported Standing balance-Leahy Scale: Poor                              Communication Communication Communication: No apparent difficulties  Cognition Arousal: Alert Behavior During Therapy: WFL for tasks assessed/performed   PT - Cognitive impairments: No apparent impairments                         Following commands: Intact      Cueing Cueing Techniques: Verbal cues  Exercises Total Joint Exercises Ankle Circles/Pumps: AROM, Both, 15 reps, Supine Quad Sets: AROM, Both, 10 reps, Supine Heel Slides: AAROM, Left, 15 reps, Supine Straight Leg Raises: AAROM, AROM, Left, 15 reps, Supine Long Arc Quad: AAROM, Left, 10 reps, Seated    General Comments        Pertinent Vitals/Pain Pain Assessment Pain Assessment: 0-10 Pain Score: 4  Pain Location: L knee Pain Descriptors / Indicators: Aching, Sore Pain Intervention(s): Limited activity within patient's tolerance, Monitored during session, Premedicated  before session    Home Living Family/patient expects to be discharged to:: Private residence Living Arrangements: Spouse/significant other;Children Available Help at Discharge: Family;Available 24 hours/day Type of Home: House Home Access: Stairs to enter Entrance Stairs-Rails: Right Entrance Stairs-Number of Steps: 3-4   Home Layout: Multi-level;Able to live on main level with  bedroom/bathroom Home Equipment: Rolling Walker (2 wheels);Cane - single point      Prior Function            PT Goals (current goals can now be found in the care plan section) Acute Rehab PT Goals Patient Stated Goal: Regain IND PT Goal Formulation: With patient Time For Goal Achievement: 04/04/24 Potential to Achieve Goals: Good Progress towards PT goals: Progressing toward goals    Frequency    7X/week      PT Plan      Co-evaluation              AM-PAC PT 6 Clicks Mobility   Outcome Measure  Help needed turning from your back to your side while in a flat bed without using bedrails?: A Little Help needed moving from lying on your back to sitting on the side of a flat bed without using bedrails?: A Little Help needed moving to and from a bed to a chair (including a wheelchair)?: A Little Help needed standing up from a chair using your arms (e.g., wheelchair or bedside chair)?: A Little Help needed to walk in hospital room?: A Little Help needed climbing 3-5 steps with a railing? : A Little 6 Click Score: 18    End of Session Equipment Utilized During Treatment: Gait belt Activity Tolerance: Patient tolerated treatment well Patient left: in chair;with call bell/phone within reach;with chair alarm set;with family/visitor present Nurse Communication: Mobility status PT Visit Diagnosis: Unsteadiness on feet (R26.81);Difficulty in walking, not elsewhere classified (R26.2)     Time: 8678-8644 PT Time Calculation (min) (ACUTE ONLY): 34 min  Charges:    $Gait Training: 8-22 mins $Therapeutic Exercise: 8-22 mins PT General Charges $$ ACUTE PT VISIT: 1 Visit                     [facility] PT Acute Rehabilitation Services Office [phone]    Damarco Keysor 03/28/2024, 2:00 PM

## 2024-03-28 NOTE — Evaluation (Signed)
 Physical Therapy Evaluation Patient Details Name: Joel Kim MRN: [id number] DOB: 1946-03-10 Today's Date: 03/28/2024  History of Present Illness  Pt s/p L TKR revision and with hx of DM, fibromyalgia, pancreatitis, TIA x 2 with memory loss, prostate CA, back surgery, spinal cord stimulator, and bil TKR  Clinical Impression  Pt s/p L TKR revision and presents with decreased L LE strength/ROM and post op pain limiting functional mobility.  Pt very motivated and should progress to dc home with family assist.  Pt reports first OP PT scheduled for 04/01/24.        If plan is discharge home, recommend the following: A little help with walking and/or transfers;A little help with bathing/dressing/bathroom;Assistance with cooking/housework;Assist for transportation;Help with stairs or ramp for entrance   Can travel by private vehicle        Equipment Recommendations None recommended by PT  Recommendations for Other Services       Functional Status Assessment Patient has had a recent decline in their functional status and demonstrates the ability to make significant improvements in function in a reasonable and predictable amount of time.     Precautions / Restrictions Precautions Precautions: Fall;Knee Restrictions Weight Bearing Restrictions Per Provider Order: Yes LLE Weight Bearing Per Provider Order: Weight bearing as tolerated      Mobility  Bed Mobility Overal bed mobility: Needs Assistance Bed Mobility: Supine to Sit     Supine to sit: Min assist     General bed mobility comments: cues for sequence and use of R LE to self assist    Transfers Overall transfer level: Needs assistance Equipment used: Rolling walker (2 wheels) Transfers: Sit to/from Stand Sit to Stand: Min assist, Contact guard assist           General transfer comment: cues for LE management and use of UEs to self assist    Ambulation/Gait Ambulation/Gait assistance: Min assist, Contact guard  assist Gait Distance (Feet): 74 Feet Assistive device: Rolling walker (2 wheels) Gait Pattern/deviations: Step-to pattern, Decreased step length - right, Decreased step length - left, Shuffle, Trunk flexed Gait velocity: decr     General Gait Details: cues for sequence, posture and position from [employer]            Wheelchair Mobility     Tilt Bed    Modified Rankin (Stroke Patients Only)       Balance Overall balance assessment: Needs assistance Sitting-balance support: Feet supported, No upper extremity supported Sitting balance-Leahy Scale: Good     Standing balance support: Bilateral upper extremity supported Standing balance-Leahy Scale: Poor                               Pertinent Vitals/Pain Pain Assessment Pain Assessment: 0-10 Pain Score: 4  Pain Location: L knee Pain Descriptors / Indicators: Aching, Sore Pain Intervention(s): Limited activity within patient's tolerance, Monitored during session, Premedicated before session, Ice applied    Home Living Family/patient expects to be discharged to:: Private residence Living Arrangements: Spouse/significant other;Children Available Help at Discharge: Family;Available 24 hours/day Type of Home: House Home Access: Stairs to enter Entrance Stairs-Rails: Right Entrance Stairs-Number of Steps: 3-4   Home Layout: Multi-level;Able to live on main level with bedroom/bathroom Home Equipment: Rolling Walker (2 wheels);Cane - single point      Prior Function Prior Level of Function : Independent/Modified Independent;History of Falls (last six months)  Mobility Comments: Community ambulator using walking stick, household ambulator without AD ADLs Comments: Independent     Extremity/Trunk Assessment   Upper Extremity Assessment Upper Extremity Assessment: Overall WFL for tasks assessed    Lower Extremity Assessment Lower Extremity Assessment: LLE deficits/detail LLE  Deficits / Details: IND SLR with AAROM at knee -5 - 85       Communication   Communication Communication: No apparent difficulties    Cognition Arousal: Alert Behavior During Therapy: WFL for tasks assessed/performed   PT - Cognitive impairments: No apparent impairments                         Following commands: Intact       Cueing Cueing Techniques: Verbal cues     General Comments      Exercises Total Joint Exercises Ankle Circles/Pumps: AROM, Both, 15 reps, Supine Quad Sets: AROM, Both, 10 reps, Supine Heel Slides: AAROM, Left, 15 reps, Supine Straight Leg Raises: AAROM, AROM, Left, 15 reps, Supine   Assessment/Plan    PT Assessment Patient needs continued PT services  PT Problem List Decreased strength;Decreased range of motion;Decreased activity tolerance;Decreased balance;Decreased mobility;Decreased knowledge of use of DME;Pain       PT Treatment Interventions DME instruction;Gait training;Stair training;Functional mobility training;Therapeutic activities;Therapeutic exercise;Patient/family education    PT Goals (Current goals can be found in the Care Plan section)  Acute Rehab PT Goals Patient Stated Goal: Regain IND PT Goal Formulation: With patient Time For Goal Achievement: 04/04/24 Potential to Achieve Goals: Good    Frequency 7X/week     Co-evaluation               AM-PAC PT 6 Clicks Mobility  Outcome Measure Help needed turning from your back to your side while in a flat bed without using bedrails?: A Little Help needed moving from lying on your back to sitting on the side of a flat bed without using bedrails?: A Little Help needed moving to and from a bed to a chair (including a wheelchair)?: A Little Help needed standing up from a chair using your arms (e.g., wheelchair or bedside chair)?: A Little Help needed to walk in hospital room?: A Little Help needed climbing 3-5 steps with a railing? : A Little 6 Click Score:  18    End of Session Equipment Utilized During Treatment: Gait belt Activity Tolerance: Patient tolerated treatment well Patient left: in chair;with call bell/phone within reach;with chair alarm set;with family/visitor present Nurse Communication: Mobility status PT Visit Diagnosis: Unsteadiness on feet (R26.81);Difficulty in walking, not elsewhere classified (R26.2)    Time: 9041-8963 PT Time Calculation (min) (ACUTE ONLY): 38 min   Charges:   PT Evaluation $PT Eval Low Complexity: 1 Low PT Treatments $Gait Training: 8-22 mins $Therapeutic Exercise: 8-22 mins PT General Charges $$ ACUTE PT VISIT: 1 Visit         [facility] Acute Rehabilitation Services Office [phone]   Shadeed Colberg 03/28/2024, 12:21 PM

## 2024-03-28 NOTE — Anesthesia Postprocedure Evaluation (Signed)
 Anesthesia Post Note  Patient: Joel Kim  Procedure(s) Performed: REVISION, TOTAL ARTHROPLASTY, KNEE (Left: Knee)     Patient location during evaluation: PACU Anesthesia Type: General and Regional Level of consciousness: awake and alert Pain management: pain level controlled Vital Signs Assessment: post-procedure vital signs reviewed and stable Respiratory status: spontaneous breathing, nonlabored ventilation, respiratory function stable and patient connected to nasal cannula oxygen  Cardiovascular status: blood pressure returned to baseline and stable Postop Assessment: no apparent nausea or vomiting Anesthetic complications: no   No notable events documented.  Last Vitals:  Vitals:   03/28/24 0655 03/28/24 1041  BP: 104/63 114/69  Pulse: 96 93  Resp: 16 17  Temp: 36.7 C 36.6 C  SpO2: 93% 95%    Last Pain:  Vitals:   03/28/24 0907  TempSrc:   PainSc: 6                  Brianda Beitler S

## 2024-03-28 NOTE — Plan of Care (Signed)
  Problem: Education: Goal: Knowledge of General Education information will improve Description: Including pain rating scale, medication(s)/side effects and non-pharmacologic comfort measures Outcome: Adequate for Discharge   Problem: Health Behavior/Discharge Planning: Goal: Ability to manage health-related needs will improve Outcome: Adequate for Discharge   Problem: Clinical Measurements: Goal: Ability to maintain clinical measurements within normal limits will improve Outcome: Adequate for Discharge Goal: Will remain free from infection Outcome: Adequate for Discharge Goal: Diagnostic test results will improve Outcome: Adequate for Discharge Goal: Respiratory complications will improve Outcome: Adequate for Discharge Goal: Cardiovascular complication will be avoided Outcome: Adequate for Discharge   Problem: Activity: Goal: Risk for activity intolerance will decrease Outcome: Adequate for Discharge   Problem: Coping: Goal: Level of anxiety will decrease Outcome: Adequate for Discharge   Problem: Elimination: Goal: Will not experience complications related to bowel motility Outcome: Adequate for Discharge   Problem: Pain Managment: Goal: General experience of comfort will improve and/or be controlled Outcome: Adequate for Discharge   Problem: Safety: Goal: Ability to remain free from injury will improve Outcome: Adequate for Discharge   Problem: Skin Integrity: Goal: Risk for impaired skin integrity will decrease Outcome: Adequate for Discharge   Problem: Education: Goal: Ability to describe self-care measures that may prevent or decrease complications (Diabetes Survival Skills Education) will improve Outcome: Adequate for Discharge Goal: Individualized Educational Video(s) Outcome: Adequate for Discharge   Problem: Coping: Goal: Ability to adjust to condition or change in health will improve Outcome: Adequate for Discharge   Problem: Fluid Volume: Goal:  Ability to maintain a balanced intake and output will improve Outcome: Adequate for Discharge   Problem: Health Behavior/Discharge Planning: Goal: Ability to identify and utilize available resources and services will improve Outcome: Adequate for Discharge Goal: Ability to manage health-related needs will improve Outcome: Adequate for Discharge   Problem: Metabolic: Goal: Ability to maintain appropriate glucose levels will improve Outcome: Adequate for Discharge   Problem: Nutritional: Goal: Maintenance of adequate nutrition will improve Outcome: Adequate for Discharge Goal: Progress toward achieving an optimal weight will improve Outcome: Adequate for Discharge   Problem: Skin Integrity: Goal: Risk for impaired skin integrity will decrease Outcome: Adequate for Discharge   Problem: Tissue Perfusion: Goal: Adequacy of tissue perfusion will improve Outcome: Adequate for Discharge   Problem: Education: Goal: Knowledge of the prescribed therapeutic regimen will improve Outcome: Adequate for Discharge   Problem: Activity: Goal: Ability to avoid complications of mobility impairment will improve Outcome: Adequate for Discharge Goal: Range of joint motion will improve Outcome: Adequate for Discharge   Problem: Clinical Measurements: Goal: Postoperative complications will be avoided or minimized Outcome: Adequate for Discharge   Problem: Pain Management: Goal: Pain level will decrease with appropriate interventions Outcome: Adequate for Discharge   Problem: Skin Integrity: Goal: Will show signs of wound healing Outcome: Adequate for Discharge

## 2024-03-28 NOTE — Discharge Summary (Signed)
 Physician Discharge Summary  Patient ID: Joel Kim MRN: [id number] DOB/AGE: [birth year] 78 y.o.  Admit date: 03/27/2024 Discharge date: 03/28/2024  Admission Diagnoses:  S/P revision of total knee, left  Discharge Diagnoses:  Principal Problem:   S/P revision of total knee, left   Past Medical History:  Diagnosis Date   Anemia    Arthritis    Asthma    Atypical nevus 01/26/2006   right side of body (slight)   Basal cell carcinoma 11/17/2015   left ear rim tx cx3 [id number]   Cancer (HCC)    Coronary artery disease    Diabetes mellitus without complication (HCC)    Fibromyalgia    GERD (gastroesophageal reflux disease)    Heart murmur    History of kidney stones    Hypertension    Hypothyroidism    Insomnia    Intractable migraine without aura    Melanoma (HCC) 10/10/2014   right ant. shoulder (exc)   Neuromuscular disorder (HCC)    feet   Pancreatitis    Pneumonia    PONV (postoperative nausea and vomiting)    Pre-diabetes    Prostate CA (HCC)    Sleep apnea    hx of no longer has per   Spondylosis of lumbar spine    Stroke (HCC) 2021   TIA x 2 some memory loss.   Thyroid disease     Surgeries: Procedure(s): REVISION, TOTAL ARTHROPLASTY, KNEE on 03/27/2024   Consultants (if any):   Discharged Condition: Improved  Hospital Course: Joel Kim is an 78 y.o. male who was admitted 03/27/2024 with a diagnosis of S/P revision of total knee, left and went to the operating room on 03/27/2024 and underwent the above named procedures.    He was given perioperative antibiotics:  Anti-infectives (From admission, onward)    Start     Dose/Rate Route Frequency Ordered Stop   03/28/24 0800  vancomycin  (VANCOCIN ) IVPB 1000 mg/200 mL premix        1,000 mg 200 mL/hr over 60 Minutes Intravenous  Once 03/28/24 0709 03/28/24 0918   03/27/24 1930  ceFAZolin (ANCEF) IVPB 2g/100 mL premix        2 g 200 mL/hr over 30 Minutes Intravenous Every 6 hours 03/27/24 1844 03/28/24 0300    03/27/24 0900  ceFAZolin (ANCEF) IVPB 2g/100 mL premix        2 g 200 mL/hr over 30 Minutes Intravenous On call to O.R. 03/27/24 0858 03/27/24 1246       He was given sequential compression devices, early ambulation, and Eliquis  for DVT prophylaxis.  POD#1 Patient doing well. He ambulated 75 feet x2 with PT. Pain controlled. D/c home with OPPT.   He benefited maximally from the hospital stay and there were no complications.    Recent vital signs:  Vitals:   03/28/24 0655 03/28/24 1041  BP: 104/63 114/69  Pulse: 96 93  Resp: 16 17  Temp: 98 F (36.7 C) 97.8 F (36.6 C)  SpO2: 93% 95%    Recent laboratory studies:  Lab Results  Component Value Date   HGB 9.5 (L) 03/28/2024   HGB 13.3 03/18/2024   HGB 14.2 12/29/2023   Lab Results  Component Value Date   WBC 17.0 (H) 03/28/2024   PLT 242 03/28/2024   Lab Results  Component Value Date   INR 1.1 04/23/2023   Lab Results  Component Value Date   NA 131 (L) 03/28/2024   K 3.5 03/28/2024   CL 96 (L)  03/28/2024   CO2 24 03/28/2024   BUN 14 03/28/2024   CREATININE 1.16 03/28/2024   GLUCOSE 182 (H) 03/28/2024     Allergies as of 03/28/2024       Reactions   Reclast [zoledronic Acid] Other (See Comments)   Ended up in hospital/ high fever and    Tapentadol Rash, Other (See Comments)   Breaks patient out  Nucynta    Paroxetine Hcl Other (See Comments)   Irritability    Statins Other (See Comments)   Muscle aches   Adhesive [tape]    Breaks patient out    Ciprofloxacin Hives   Mannitol Other (See Comments)   Unknown   Flagyl [metronidazole] Rash   Latex Rash   Irritated skin        Medication List     TAKE these medications    albuterol  108 (90 Base) MCG/ACT inhaler Commonly known as: VENTOLIN  HFA Inhale 2 puffs into the lungs every 6 (six) hours as needed. What changed: reasons to take this   amLODipine  5 MG tablet Commonly known as: NORVASC  Take 5 mg by mouth daily.   aspirin  EC 81 MG  tablet Take 81 mg by mouth at bedtime.   cetirizine 10 MG tablet Commonly known as: ZYRTEC Take 10 mg by mouth at bedtime.   chlorthalidone 25 MG tablet Commonly known as: HYGROTON Take 25 mg by mouth daily.   clotrimazole-betamethasone cream Commonly known as: LOTRISONE Apply 1 Application topically 2 (two) times daily as needed (skin irritation.).   D3 + K2 PO Take 1 tablet by mouth in the morning.   docusate sodium  100 MG capsule Commonly known as: Colace Take 1 capsule (100 mg total) by mouth 2 (two) times daily.   Eliquis  2.5 MG Tabs tablet Generic drug: apixaban  Take 1 tablet (2.5 mg total) by mouth 2 (two) times daily.   levothyroxine  100 MCG tablet Commonly known as: SYNTHROID  Take 100 mcg by mouth daily before breakfast.   lisinopril 10 MG tablet Commonly known as: ZESTRIL Take 10 mg by mouth in the morning.   montelukast 10 MG tablet Commonly known as: SINGULAIR Take 10 mg by mouth at bedtime.   multivitamin with minerals Tabs tablet Take 1 tablet by mouth in the morning.   mupirocin ointment 2 % Commonly known as: BACTROBAN Apply 1 Application topically 3 (three) times daily as needed (WOUND CARE).   ondansetron  4 MG tablet Commonly known as: Zofran  Take 1 tablet (4 mg total) by mouth every 8 (eight) hours as needed for nausea or vomiting.   oxyCODONE  5 MG immediate release tablet Commonly known as: Roxicodone  Take 2 tablets (10 mg total) by mouth every 4 (four) hours as needed for severe pain (pain score 7-10). What changed:  medication strength when to take this reasons to take this   pantoprazole  40 MG tablet Commonly known as: PROTONIX  Take 1 tablet (40 mg total) by mouth daily.   polyethylene glycol 17 g packet Commonly known as: MIRALAX  / GLYCOLAX  Take 17 g by mouth daily as needed for mild constipation. What changed: Another medication with the same name was added. Make sure you understand how and when to take each.   polyethylene  glycol powder 17 GM/SCOOP powder Commonly known as: MiraLax  Take 17 grams dissolved in liquid by mouth daily as needed for mild constipation or moderate constipation. What changed: You were already taking a medication with the same name, and this prescription was added. Make sure you understand how and when to  take each.   ranolazine  500 MG 12 hr tablet Commonly known as: RANEXA  Take 500 mg by mouth 2 (two) times daily.   Repatha SureClick 140 MG/ML Soaj Generic drug: Evolocumab Inject 140 mg into the skin every 14 (fourteen) days. Wednesday   senna 8.6 MG Tabs tablet Commonly known as: SENOKOT Take 2 tablets (17.2 mg total) by mouth at bedtime for 15 days.   tirzepatide 2.5 MG/0.5ML Pen Commonly known as: MOUNJARO Inject 2.5 mg into the skin every Friday.   traZODone  150 MG tablet Commonly known as: DESYREL  Take 150 mg by mouth at bedtime.   VITAMIN C GUMMIE PO Take 1 tablet by mouth in the morning.   Xtampza  ER 9 MG C12a Generic drug: oxyCODONE  ER Take 9 mg by mouth 2 (two) times daily.               Discharge Care Instructions  (From admission, onward)           Start     Ordered   03/28/24 0000  Weight bearing as tolerated        03/28/24 1230   03/28/24 0000  Change dressing       Comments: Do not remove your dressing.   03/28/24 1230              WEIGHT BEARING   Weight bearing as tolerated with assist device (walker, cane, etc) as directed, use it as long as suggested by your surgeon or therapist, typically at least 4-6 weeks.   EXERCISES  Results after joint replacement surgery are often greatly improved when you follow the exercise, range of motion and muscle strengthening exercises prescribed by your doctor. Safety measures are also important to protect the joint from further injury. Any time any of these exercises cause you to have increased pain or swelling, decrease what you are doing until you are comfortable again and then slowly  increase them. If you have problems or questions, call your caregiver or physical therapist for advice.   Rehabilitation is important following a joint replacement. After just a few days of immobilization, the muscles of the leg can become weakened and shrink (atrophy).  These exercises are designed to build up the tone and strength of the thigh and leg muscles and to improve motion. Often times heat used for twenty to thirty minutes before working out will loosen up your tissues and help with improving the range of motion but do not use heat for the first two weeks following surgery (sometimes heat can increase post-operative swelling).   These exercises can be done on a training (exercise) mat, on the floor, on a table or on a bed. Use whatever works the best and is most comfortable for you.    Use music or television while you are exercising so that the exercises are a pleasant break in your day. This will make your life better with the exercises acting as a break in your routine that you can look forward to.   Perform all exercises about fifteen times, three times per day or as directed.  You should exercise both the operative leg and the other leg as well.  Exercises include:   Quad Sets - Tighten up the muscle on the front of the thigh (Quad) and hold for 5-10 seconds.   Straight Leg Raises - With your knee straight (if you were given a brace, keep it on), lift the leg to 60 degrees, hold for 3 seconds, and slowly lower the  leg.  Perform this exercise against resistance later as your leg gets stronger.  Leg Slides: Lying on your back, slowly slide your foot toward your buttocks, bending your knee up off the floor (only go as far as is comfortable). Then slowly slide your foot back down until your leg is flat on the floor again.  Angel Wings: Lying on your back spread your legs to the side as far apart as you can without causing discomfort.  Hamstring Strength:  Lying on your back, push your heel  against the floor with your leg straight by tightening up the muscles of your buttocks.  Repeat, but this time bend your knee to a comfortable angle, and push your heel against the floor.  You may put a pillow under the heel to make it more comfortable if necessary.   A rehabilitation program following joint replacement surgery can speed recovery and prevent re-injury in the future due to weakened muscles. Contact your doctor or a physical therapist for more information on knee rehabilitation.    CONSTIPATION  Constipation is defined medically as fewer than three stools per week and severe constipation as less than one stool per week.  Even if you have a regular bowel pattern at home, your normal regimen is likely to be disrupted due to multiple reasons following surgery.  Combination of anesthesia, postoperative narcotics, change in appetite and fluid intake all can affect your bowels.   YOU MUST use at least one of the following options; they are listed in order of increasing strength to get the job done.  They are all available over the counter, and you may need to use some, POSSIBLY even all of these options:    Drink plenty of fluids (prune juice may be helpful) and high fiber foods Colace 100 mg by mouth twice a day  Senokot for constipation as directed and as needed Dulcolax (bisacodyl ), take with full glass of water  Miralax  (polyethylene glycol) once or twice a day as needed.  If you have tried all these things and are unable to have a bowel movement in the first 3-4 days after surgery call either your surgeon or your primary doctor.    If you experience loose stools or diarrhea, hold the medications until you stool forms back up.  If your symptoms do not get better within 1 week or if they get worse, check with your doctor.  If you experience the worst abdominal pain ever or develop nausea or vomiting, please contact the office immediately for further recommendations for  treatment.   ITCHING:  If you experience itching with your medications, try taking only a single pain pill, or even half a pain pill at a time.  You can also use Benadryl  over the counter for itching or also to help with sleep.   TED HOSE STOCKINGS:  Use stockings on both legs until for at least 2 weeks or as directed by physician office. They may be removed at night for sleeping.  MEDICATIONS:  See your medication summary on the "After Visit Summary" that nursing will review with you.  You may have some home medications which will be placed on hold until you complete the course of blood thinner medication.  It is important for you to complete the blood thinner medication as prescribed.  PRECAUTIONS:  If you experience chest pain or shortness of breath - call 911 immediately for transfer to the hospital emergency department.   If you develop a fever greater that 101 F,  purulent drainage from wound, increased redness or drainage from wound, foul odor from the wound/dressing, or calf pain - CONTACT YOUR SURGEON.                                                   FOLLOW-UP APPOINTMENTS:  If you do not already have a post-op appointment, please call the office for an appointment to be seen by your surgeon.  Guidelines for how soon to be seen are listed in your "After Visit Summary", but are typically between 1-4 weeks after surgery.  OTHER INSTRUCTIONS:   Knee Replacement:  Do not place pillow under knee, focus on keeping the knee straight while resting. CPM instructions: 0-90 degrees, 2 hours in the morning, 2 hours in the afternoon, and 2 hours in the evening. Place foam block, curve side up under heel at all times except when in CPM or when walking.  DO NOT modify, tear, cut, or change the foam block in any way.   MAKE SURE YOU:  Understand these instructions.  Get help right away if you are not doing well or get worse.    Thank you for letting us  be a part of your medical care team.  It is a  privilege we respect greatly.  We hope these instructions will help you stay on track for a fast and full recovery!   Diagnostic Studies: DG Knee Left Port Result Date: 03/27/2024 CLINICAL DATA:  Status post left knee revision. EXAM: PORTABLE LEFT KNEE - 1-2 VIEW COMPARISON:  None Available. FINDINGS: Revision left knee arthroplasty in expected alignment. No periprosthetic lucency or fracture. There has been patellar resurfacing. Recent postsurgical change includes air and edema in the soft tissues and joint space. Anterior skin staples. IMPRESSION: Revision left knee arthroplasty without immediate postoperative complication. Electronically Signed   By: Andrea Gasman M.D.   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 Sheree, MD Procedure Performed: 1.  Percutaneous ultrasound-guided cannulation right common femoral vein 2.  IVC venogram- 3.  Placement of infrarenal Cook Celect IVC filter 4.  Moderate sedation with fentanyl  and Versed  for 9 minutes Indications: 78 year old male has a history of a DVT in his left lower extremity after his left knee surgery.  He is now planned for left total knee arthroplasty and we are planning for IVC filter which can be removed after he has no further surgical interventions planned. Findings: The IVC measured approximately 20 mm across and the cava did have a slant the filter was placed with the clock in the midline just below the renal veins.  Procedure:  The patient was identified in the holding area and taken to room 8.  The patient was then placed supine on the table and prepped and draped in the usual sterile fashion.  A time out was called.  Ultrasound was used to evaluate the right common femoral vein.  This was obtained  compressible.  The area was anesthetized 1% lidocaine  cannulated with direct ultrasound visualization using micropuncture needle, wire and sheath.  Ultrasound images saved the permanent record.  Concomitantly we administered fentanyl  and Versed  as moderate sedation as vital signs were monitored throughout  the case.  We placed a Bentson wire followed by the filter introducer sheath and perform central venogram in 2 locations identified the renal veins.  These were marked on the screen.  The filter was then deployed and a still shot was performed.  Introducer sheath was removed and pressure held hemostasis was obtained.  Patient tolerated the procedure well immediate complication. Contrast: 10 cc Brandon C. Sheree, MD [facility] Office: [phone] Pager: [phone]   Disposition: Discharge disposition: 01-Home or Self Care       Discharge Instructions     Call MD / Call 911   Complete by: As directed    If you experience chest pain or shortness of breath, CALL 911 and be transported to the hospital emergency room.  If you develope a fever above 101 F, pus (white drainage) or increased drainage or redness at the wound, or calf pain, call your surgeon's office.   Change dressing   Complete by: As directed    Do not remove your dressing.   Constipation Prevention   Complete by: As directed    Drink plenty of fluids.  Prune juice may be helpful.  You may use a stool softener, such as Colace (over the counter) 100 mg twice a day.  Use MiraLax  (over the counter) for constipation as needed.   Diet Carb Modified   Complete by: As directed    Discharge instructions   Complete by: As directed    Elevate toes above nose. Use cryotherapy as needed for pain and swelling.   Do not put a pillow under the knee. Place it under the heel.   Complete by: As directed    Driving restrictions   Complete by: As directed    No driving for 6 weeks   Increase activity slowly as  tolerated   Complete by: As directed    Lifting restrictions   Complete by: As directed    No lifting for 6 weeks   Post-operative opioid taper instructions:   Complete by: As directed    POST-OPERATIVE OPIOID TAPER INSTRUCTIONS: It is important to wean off of your opioid medication as soon as possible. If you do not need pain medication after your surgery it is ok to stop day one. Opioids include: Codeine, Hydrocodone (Norco, Vicodin), Oxycodone (Percocet, oxycontin ) and hydromorphone  amongst others.  Long term and even short term use of opiods can cause: Increased pain response Dependence Constipation Depression Respiratory depression And more.  Withdrawal symptoms can include Flu like symptoms Nausea, vomiting And more Techniques to manage these symptoms Hydrate well Eat regular healthy meals Stay active Use relaxation techniques(deep breathing, meditating, yoga) Do Not substitute Alcohol to help with tapering If you have been on opioids for less than two weeks and do not have pain than it is ok to stop all together.  Plan to wean off of opioids This plan should start within one week post op of your joint replacement. Maintain the same interval or time between taking each dose and first decrease the dose.  Cut the total daily intake of opioids by one tablet each day Next start to increase the time between doses. The last dose that should be eliminated is the evening dose.      TED hose   Complete by: As directed    Use stockings (TED hose) for 2 weeks on both leg(s).  You may remove them at night for sleeping.   Weight bearing as tolerated   Complete by: As directed  Follow-up Information     Leigh Valery RAMAN, [place]. Schedule an appointment as soon as possible for a visit in 2 week(s).   Specialty: Orthopedic Surgery Why: For suture removal, For wound re-check Contact information: [address] [place] [address] [phone]                   Signed: Valery RAMAN Leigh 03/28/2024, 3:46 PM

## 2024-03-28 NOTE — Plan of Care (Signed)
  Problem: Education: Goal: Knowledge of General Education information will improve Description: Including pain rating scale, medication(s)/side effects and non-pharmacologic comfort measures Outcome: Progressing   Problem: Health Behavior/Discharge Planning: Goal: Ability to manage health-related needs will improve Outcome: Progressing   Problem: Clinical Measurements: Goal: Ability to maintain clinical measurements within normal limits will improve Outcome: Progressing Goal: Will remain free from infection Outcome: Progressing Goal: Diagnostic test results will improve Outcome: Progressing Goal: Respiratory complications will improve Outcome: Progressing Goal: Cardiovascular complication will be avoided Outcome: Progressing   Problem: Activity: Goal: Risk for activity intolerance will decrease Outcome: Progressing   Problem: Nutrition: Goal: Adequate nutrition will be maintained Outcome: Completed/Met   Problem: Coping: Goal: Level of anxiety will decrease Outcome: Progressing   Problem: Elimination: Goal: Will not experience complications related to bowel motility Outcome: Progressing Goal: Will not experience complications related to urinary retention Outcome: Completed/Met   Problem: Pain Managment: Goal: General experience of comfort will improve and/or be controlled Outcome: Progressing   Problem: Safety: Goal: Ability to remain free from injury will improve Outcome: Progressing   Problem: Skin Integrity: Goal: Risk for impaired skin integrity will decrease Outcome: Adequate for Discharge   Problem: Education: Goal: Knowledge of the prescribed therapeutic regimen will improve Outcome: Progressing Goal: Individualized Educational Video(s) Outcome: Completed/Met   Problem: Activity: Goal: Ability to avoid complications of mobility impairment will improve Outcome: Progressing Goal: Range of joint motion will improve Outcome: Adequate for Discharge    Problem: Clinical Measurements: Goal: Postoperative complications will be avoided or minimized Outcome: Progressing   Problem: Pain Management: Goal: Pain level will decrease with appropriate interventions Outcome: Progressing   Problem: Skin Integrity: Goal: Will show signs of wound healing Outcome: Progressing

## 2024-03-28 NOTE — TOC Transition Note (Signed)
 Transition of Care [facility]) - Discharge Note   Patient Details  Name: Joel Kim MRN: [id number] Date of Birth: 26-Apr-1946  [facility] of [facility]) CM/SW Contact:  NORMAN ASPEN, LCSW Phone Number: 03/28/2024, 2:23 PM   Clinical Narrative:     Met with pt who confirms he has needed DME in the home.  OPPT already arranged with [facility]).  No further TOC needs.  Final next level of care: OP Rehab Barriers to Discharge: No Barriers Identified   Patient Goals and CMS Choice Patient states their goals for this hospitalization and ongoing recovery are:: return home          Discharge Placement                       Discharge Plan and Services Additional resources added to the After Visit Summary for                                       Social Drivers of Health (SDOH) Interventions SDOH Screenings   Food Insecurity: No Food Insecurity (03/27/2024)  Housing: Low Risk  (03/27/2024)  Transportation Needs: No Transportation Needs (03/27/2024)  Utilities: Not At Risk (03/27/2024)  Financial Resource Strain: Low Risk  (11/23/2023)   Received from [facility]  Physical Activity: Insufficiently Active (11/23/2023)   Received from [facility]  Social Connections: Moderately Integrated (03/27/2024)  Stress: No Stress Concern Present (11/23/2023)   Received from [facility]  Tobacco Use: Low Risk  (03/27/2024)  Recent Concern: Tobacco Use - Medium Risk (03/06/2024)   Received from [facility]     Readmission Risk Interventions    03/28/2024    2:22 PM 04/24/2023   12:51 PM  Readmission Risk Prevention Plan  Post Dischage Appt  Not Complete  Medication Screening  Complete  Transportation Screening Complete Complete  PCP or Specialist Appt within 5-7 Days Complete   Home Care Screening Complete   Medication Review (RN CM) Complete

## 2024-03-28 NOTE — Progress Notes (Signed)
 TOC meds in a secure bag delivered to pt & wife in room by this RN

## 2024-04-01 ENCOUNTER — Ambulatory Visit: Admitting: Physical Therapy

## 2024-04-04 ENCOUNTER — Ambulatory Visit: Attending: Orthopedic Surgery

## 2024-04-04 DIAGNOSIS — M25662 Stiffness of left knee, not elsewhere classified: Secondary | ICD-10-CM | POA: Diagnosis present

## 2024-04-04 DIAGNOSIS — Z9181 History of falling: Secondary | ICD-10-CM | POA: Insufficient documentation

## 2024-04-04 DIAGNOSIS — M6281 Muscle weakness (generalized): Secondary | ICD-10-CM | POA: Insufficient documentation

## 2024-04-04 DIAGNOSIS — M25562 Pain in left knee: Secondary | ICD-10-CM | POA: Insufficient documentation

## 2024-04-04 DIAGNOSIS — R600 Localized edema: Secondary | ICD-10-CM | POA: Diagnosis present

## 2024-04-04 NOTE — Therapy (Signed)
 OUTPATIENT PHYSICAL THERAPY LOWER EXTREMITY EVALUATION   Patient Name: Joel Kim MRN: [id number] DOB:16-Apr-1946, 78 y.o., male Today's Date: 04/04/2024  END OF SESSION:  PT End of Session - 04/04/24 1006     Visit Number 1    Number of Visits 8    Date for PT Re-Evaluation 05/03/24    PT Start Time 1017    PT Stop Time 1045    PT Time Calculation (min) 28 min    Activity Tolerance Patient tolerated treatment well    Behavior During Therapy [facility] for tasks assessed/performed          Past Medical History:  Diagnosis Date   Anemia    Arthritis    Asthma    Atypical nevus 01/26/2006   right side of body (slight)   Basal cell carcinoma 11/17/2015   left ear rim tx cx3 56fu   Cancer (HCC)    Coronary artery disease    Diabetes mellitus without complication (HCC)    Fibromyalgia    GERD (gastroesophageal reflux disease)    Heart murmur    History of kidney stones    Hypertension    Hypothyroidism    Insomnia    Intractable migraine without aura    Melanoma (HCC) 10/10/2014   right ant. shoulder (exc)   Neuromuscular disorder (HCC)    feet   Pancreatitis    Pneumonia    PONV (postoperative nausea and vomiting)    Pre-diabetes    Prostate CA (HCC)    Sleep apnea    hx of no longer has per   Spondylosis of lumbar spine    Stroke (HCC) 2021   TIA x 2 some memory loss.   Thyroid disease    Past Surgical History:  Procedure Laterality Date   BACK SURGERY  2013   L4-L5 and a redo in 2014 after a fall   CHOLECYSTECTOMY  1992   FOOT SURGERY Left 1999   IVC FILTER INSERTION N/A 03/18/2024   Procedure: IVC FILTER INSERTION;  Surgeon: Sheree Penne Bruckner, MD;  Location: [facility] INVASIVE CV LAB;  Service: Cardiovascular;  Laterality: N/A;   MOHS SURGERY     PROSTATECTOMY  2008   REIMPLANTATION OF TOTAL KNEE Left 03/27/2024   Procedure: REVISION, TOTAL ARTHROPLASTY, KNEE;  Surgeon: Fidel Rogue, MD;  Location: [facility];  Service: Orthopedics;  Laterality: Left;    ROTATOR CUFF REPAIR Left 2021   SPINAL CORD STIMULATOR IMPLANT  2020   TOTAL KNEE ARTHROPLASTY Right 2005   TOTAL KNEE ARTHROPLASTY Left 01/19/2022   Procedure: TOTAL KNEE ARTHROPLASTY;  Surgeon: Gerome Charleston, MD;  Location: [facility];  Service: Orthopedics;  Laterality: Left;  adductor canal 120   Patient Active Problem List   Diagnosis Date Noted   S/P revision of total knee, 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   Sepsis due to undetermined organism (HCC) 04/23/2023   Chronic migraine without aura, intractable, with status migrainosus 03/09/2023   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HCC) 01/25/2022   S/P total knee arthroplasty, 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   Hypokalemia 01/25/2022   Acute anemia 01/25/2022   Acute metabolic encephalopathy 01/25/2022   Osteoarthritis of left knee 01/19/2022   Atypical chest pain 08/15/2018   Hyperlipidemia 08/15/2018   Family history of heart disease 08/15/2018   Hypertension 05/31/2018   Hypothyroidism 05/31/2018   Acute pancreatitis 05/31/2018   Mild renal insufficiency 05/31/2018   REFERRING PROVIDER: Leigh Serge PA-C  REFERRING DIAG: Presence of left artificial knee joint  THERAPY DIAG:  Acute pain of left knee  Stiffness of left knee, not elsewhere classified  Localized edema  Rationale for Evaluation and Treatment: Rehabilitation  ONSET DATE: 03/27/24  SUBJECTIVE:   SUBJECTIVE STATEMENT: Patient reports that he had a revision to his left total knee arthroplasty on 03/27/24. He feels that it has done better compared to the original total knee replacement. His wife notes that he has been trying to walk without his walker.   PERTINENT HISTORY: Allergies (latex and adhesive), hypertension, history of DVTs, osteoarthritis, history of a CVA, history of cancer, asthma, diabetes, and fibromyalgia PAIN:  Are you having pain? Yes: NPRS scale: Current: 5-6/10 Best: 3-4/10 Worst: 6/10 Pain location: left knee Pain description: throbbing and aching  Aggravating factors: moving  Relieving  factors: medication and ice  PRECAUTIONS: Fall  RED FLAGS: None   WEIGHT BEARING RESTRICTIONS: No  FALLS:  Has patient fallen in last 6 months? Yes. Number of falls 4; most recent fall was weeks before surgery  LIVING ENVIRONMENT: Lives with: lives with their family Lives in: House/apartment Stairs: Yes: Internal: 13 steps; on left going up; step to pattern Has following equipment at home: Vannie - 2 wheeled  OCCUPATION: retired  PLOF: Independent  PATIENT GOALS: be able to walk and be outside  NEXT MD VISIT: 04/09/24  OBJECTIVE:  Note: Objective measures were completed at Evaluation unless otherwise noted.  DIAGNOSTIC FINDINGS: 03/27/24 left knee x-ray IMPRESSION: Revision left knee arthroplasty without immediate postoperative complication. PATIENT SURVEYS:  LEFS  Extreme difficulty/unable (0), Quite a bit of difficulty (1), Moderate difficulty (2), Little difficulty (3), No difficulty (4) Survey date:  04/04/24  Any of your usual work, housework or school activities 0  2. Usual hobbies, recreational or sporting activities 0  3. Getting into/out of the bath 2  4. Walking between rooms 3  5. Putting on socks/shoes 0  6. Squatting  0  7. Lifting an object, like a bag of groceries from the floor 0  8. Performing light activities around your home 0  9. Performing heavy activities around your home 0  10. Getting into/out of a car 2  11. Walking 2 blocks 0  12. Walking 1 mile 0  13. Going up/down 10 stairs (1 flight) 0  14. Standing for 1 hour 0  15.  sitting for 1 hour 4  16. Running on even ground 0  17. Running on uneven ground 0  18. Making sharp turns while running fast 0  19. Hopping  0  20. Rolling over in bed 2  Score total:  13/80     COGNITION: Overall cognitive status: Within functional limits for tasks assessed     SENSATION: Patient reports no numbness or tingling  EDEMA:  Circumferential: Left tibiofemoral joint line: 46.0 cm Right tibiofemoral  joint line: 40.4 cm   PALPATION: TTP: left hip adductors  LOWER EXTREMITY ROM:  Active ROM Right eval Left eval  Hip flexion    Hip extension    Hip abduction    Hip adduction    Hip internal rotation    Hip external rotation    Knee flexion 129 85 PROM: 89   Knee extension 0 17; limited by pain   Ankle dorsiflexion    Ankle plantarflexion    Ankle inversion    Ankle eversion     (Blank rows = not tested)  LOWER EXTREMITY MMT: not tested due to surgical condition  GAIT: Assistive device utilized: Walker - 2 wheeled Level of assistance: Modified independence Comments: Decreased gait speed with the  left knee flexed in stance phase and decreased stride length                                                                                                                                TREATMENT DATE:     PATIENT EDUCATION:  Education details: Plan of care, objective findings, healing, x-ray results, prognosis, and goals for physical therapy Person educated: Patient and Spouse Education method: Explanation Education comprehension: verbalized understanding  HOME EXERCISE PROGRAM: Reviewed postoperative HEP  ASSESSMENT:  CLINICAL IMPRESSION: Patient is a 78 y.o. male who was seen today for physical therapy evaluation and treatment following a left total knee revision on 03/27/24.  He presented with low to moderate pain severity and irritability with left knee extension being the most aggravating to his familiar symptoms.  He exhibited increased edema in his left lower extremity.  However, he exhibited no other signs or symptoms of a postoperative complication.  Recommend that he continue with skilled physical therapy to address his impairments to return to his prior level of function.  OBJECTIVE IMPAIRMENTS: Abnormal gait, decreased activity tolerance, decreased balance, decreased mobility, difficulty walking, decreased ROM, decreased strength, hypomobility, increased edema,  and pain.   ACTIVITY LIMITATIONS: standing, squatting, stairs, transfers, and locomotion level  PARTICIPATION LIMITATIONS: driving, shopping, community activity, and yard work  PERSONAL FACTORS: Past/current experiences, Transportation, and 3+ comorbidities: Allergies (latex and adhesive), hypertension, history of DVTs, osteoarthritis, history of a CVA, history of cancer, asthma, diabetes, and fibromyalgia are also affecting patient's functional outcome.   REHAB POTENTIAL: Good  CLINICAL DECISION MAKING: Evolving/moderate complexity  EVALUATION COMPLEXITY: Moderate   GOALS: Goals reviewed with patient? Yes  LONG TERM GOALS: Target date: 05/02/24  Patient will be independent with his HEP. Baseline:  Goal status: INITIAL  2.  Patient will be able to demonstrate at least 120 degrees of active left knee flexion for improved function navigating stairs. Baseline:  Goal status: INITIAL  3.  Patient will improve his active left knee extension to within 5 degrees of neutral for improved gait mechanics. Baseline:  Goal status: INITIAL  4.  Patient will be able to navigate at least 4 steps with a reciprocal pattern for improved household mobility. Baseline:  Goal status: INITIAL  5.  Patient will be able to ambulate at least 80 feet with minimal to no significant gait deviations for improved household mobility. Baseline:  Goal status: INITIAL  6.  Patient will improve his LEFS score to 45/80 or greater for improved perceived function with his daily activities.  Baseline:  Goal status: INITIAL   PLAN:  PT FREQUENCY: 2x/week  PT DURATION: 4 weeks  PLANNED INTERVENTIONS: 97164- PT Re-evaluation, 97750- Physical Performance Testing, 97110-Therapeutic exercises, 97530- Therapeutic activity, [id number]- Neuromuscular re-education, 97535- Self Care, 02859- Manual therapy, [phone]- Gait training, [phone]- Electrical stimulation (unattended), 97016- Vasopneumatic device, Patient/Family education,  Balance training, Stair training, Joint mobilization, Cryotherapy, and Moist heat  PLAN FOR NEXT SESSION:  Nustep, quad sets, heel slides, lower extremity strengthening, manual therapy, and modalities as needed   Lacinda JAYSON Fass, PT 04/04/2024, 12:56 PM

## 2024-04-05 ENCOUNTER — Encounter: Payer: Self-pay | Admitting: Physical Therapy

## 2024-04-05 ENCOUNTER — Ambulatory Visit: Payer: PRIVATE HEALTH INSURANCE | Admitting: Physical Therapy

## 2024-04-05 DIAGNOSIS — M25662 Stiffness of left knee, not elsewhere classified: Secondary | ICD-10-CM

## 2024-04-05 DIAGNOSIS — M25562 Pain in left knee: Secondary | ICD-10-CM | POA: Diagnosis not present

## 2024-04-05 DIAGNOSIS — R600 Localized edema: Secondary | ICD-10-CM

## 2024-04-05 NOTE — Therapy (Signed)
 OUTPATIENT PHYSICAL THERAPY LOWER EXTREMITY TREATMENT   Patient Name: Joel Kim MRN: [id number] DOB:[birth year], 78 y.o., male Today's Date: 04/05/2024  END OF SESSION:  PT End of Session - 04/05/24 1125     Visit Number 2    Number of Visits 8    Date for PT Re-Evaluation 05/03/24    PT Start Time 1101    PT Stop Time 1155    PT Time Calculation (min) 54 min    Activity Tolerance Patient tolerated treatment well    Behavior During Therapy [facility] for tasks assessed/performed          Past Medical History:  Diagnosis Date   Anemia    Arthritis    Asthma    Atypical nevus 01/26/2006   right side of body (slight)   Basal cell carcinoma 11/17/2015   left ear rim tx cx3 73fu   Cancer (HCC)    Coronary artery disease    Diabetes mellitus without complication (HCC)    Fibromyalgia    GERD (gastroesophageal reflux disease)    Heart murmur    History of kidney stones    Hypertension    Hypothyroidism    Insomnia    Intractable migraine without aura    Melanoma (HCC) 10/10/2014   right ant. shoulder (exc)   Neuromuscular disorder (HCC)    feet   Pancreatitis    Pneumonia    PONV (postoperative nausea and vomiting)    Pre-diabetes    Prostate CA (HCC)    Sleep apnea    hx of no longer has per   Spondylosis of lumbar spine    Stroke (HCC) 2021   TIA x 2 some memory loss.   Thyroid disease    Past Surgical History:  Procedure Laterality Date   BACK SURGERY  2013   L4-L5 and a redo in 2014 after a fall   CHOLECYSTECTOMY  1992   FOOT SURGERY Left 1999   IVC FILTER INSERTION N/A 03/18/2024   Procedure: IVC FILTER INSERTION;  Surgeon: Sheree Penne Bruckner, MD;  Location: [facility] INVASIVE CV LAB;  Service: Cardiovascular;  Laterality: N/A;   MOHS SURGERY     PROSTATECTOMY  2008   REIMPLANTATION OF TOTAL KNEE Left 03/27/2024   Procedure: REVISION, TOTAL ARTHROPLASTY, KNEE;  Surgeon: Fidel Rogue, MD;  Location: [facility];  Service: Orthopedics;  Laterality: Left;    ROTATOR CUFF REPAIR Left 2021   SPINAL CORD STIMULATOR IMPLANT  2020   TOTAL KNEE ARTHROPLASTY Right 2005   TOTAL KNEE ARTHROPLASTY Left 01/19/2022   Procedure: TOTAL KNEE ARTHROPLASTY;  Surgeon: Gerome Charleston, MD;  Location: [facility];  Service: Orthopedics;  Laterality: Left;  adductor canal 120   Patient Active Problem List   Diagnosis Date Noted   S/P revision of total knee, 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   Sepsis due to undetermined organism (HCC) 04/23/2023   Chronic migraine without aura, intractable, with status migrainosus 03/09/2023   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HCC) 01/25/2022   S/P total knee arthroplasty, 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   Hypokalemia 01/25/2022   Acute anemia 01/25/2022   Acute metabolic encephalopathy 01/25/2022   Osteoarthritis of left knee 01/19/2022   Atypical chest pain 08/15/2018   Hyperlipidemia 08/15/2018   Family history of heart disease 08/15/2018   Hypertension 05/31/2018   Hypothyroidism 05/31/2018   Acute pancreatitis 05/31/2018   Mild renal insufficiency 05/31/2018   REFERRING PROVIDER: Leigh Serge PA-C  REFERRING DIAG: Presence of left artificial knee joint  THERAPY DIAG:  Acute pain of left knee  Stiffness of left knee, not elsewhere classified  Localized edema  Rationale for Evaluation and Treatment: Rehabilitation  ONSET DATE: 03/27/24  SUBJECTIVE:   SUBJECTIVE STATEMENT: Patient reports that he had a revision to his left total knee arthroplasty on 03/27/24. He feels that it has done better compared to the original total knee replacement. His wife notes that he has been trying to walk without his walker.   PERTINENT HISTORY: Allergies (latex and adhesive), hypertension, history of DVTs, osteoarthritis, history of a CVA, history of cancer, asthma, diabetes, and fibromyalgia PAIN:  Are you having pain? Yes: NPRS scale: Current: 5-6/10 Best: 3-4/10 Worst: 6/10 Pain location: left knee Pain description: throbbing and aching  Aggravating factors: moving  Relieving  factors: medication and ice  PRECAUTIONS: Fall  RED FLAGS: None   WEIGHT BEARING RESTRICTIONS: No  FALLS:  Has patient fallen in last 6 months? Yes. Number of falls 4; most recent fall was weeks before surgery  LIVING ENVIRONMENT: Lives with: lives with their family Lives in: House/apartment Stairs: Yes: Internal: 13 steps; on left going up; step to pattern Has following equipment at home: Vannie - 2 wheeled  OCCUPATION: retired  PLOF: Independent  PATIENT GOALS: be able to walk and be outside  NEXT MD VISIT: 04/09/24  OBJECTIVE:  Note: Objective measures were completed at Evaluation unless otherwise noted.  DIAGNOSTIC FINDINGS: 03/27/24 left knee x-ray IMPRESSION: Revision left knee arthroplasty without immediate postoperative complication. PATIENT SURVEYS:  LEFS  Extreme difficulty/unable (0), Quite a bit of difficulty (1), Moderate difficulty (2), Little difficulty (3), No difficulty (4) Survey date:  04/04/24  Any of your usual work, housework or school activities 0  2. Usual hobbies, recreational or sporting activities 0  3. Getting into/out of the bath 2  4. Walking between rooms 3  5. Putting on socks/shoes 0  6. Squatting  0  7. Lifting an object, like a bag of groceries from the floor 0  8. Performing light activities around your home 0  9. Performing heavy activities around your home 0  10. Getting into/out of a car 2  11. Walking 2 blocks 0  12. Walking 1 mile 0  13. Going up/down 10 stairs (1 flight) 0  14. Standing for 1 hour 0  15.  sitting for 1 hour 4  16. Running on even ground 0  17. Running on uneven ground 0  18. Making sharp turns while running fast 0  19. Hopping  0  20. Rolling over in bed 2  Score total:  13/80     COGNITION: Overall cognitive status: Within functional limits for tasks assessed     SENSATION: Patient reports no numbness or tingling  EDEMA:  Circumferential: Left tibiofemoral joint line: 46.0 cm Right tibiofemoral  joint line: 40.4 cm   PALPATION: TTP: left hip adductors  LOWER EXTREMITY ROM:  Active ROM Right eval Left eval  Hip flexion    Hip extension    Hip abduction    Hip adduction    Hip internal rotation    Hip external rotation    Knee flexion 129 85 PROM: 89   Knee extension 0 17; limited by pain   Ankle dorsiflexion    Ankle plantarflexion    Ankle inversion    Ankle eversion     (Blank rows = not tested)  LOWER EXTREMITY MMT: not tested due to surgical condition  GAIT: Assistive device utilized: Walker - 2 wheeled Level of assistance: Modified independence Comments: Decreased gait speed with the  left knee flexed in stance phase and decreased stride length                                                                                                                                TREATMENT DATE:    04/05/24:                                       EXERCISE LOG  Exercise Repetitions and Resistance Comments  Nustep 15 minutes for flexion and seat back x 2 minutes for extension   LAQ's 2# x 3 minutes               PROM into flexion and extension x 6 minutes f/b LE elevation with vasopneumatic x 20 minutes.     PATIENT EDUCATION:  Education details: Plan of care, objective findings, healing, x-ray results, prognosis, and goals for physical therapy Person educated: Patient and Spouse Education method: Explanation Education comprehension: verbalized understanding  HOME EXERCISE PROGRAM: Reviewed postoperative HEP  ASSESSMENT:  CLINICAL IMPRESSION: Patient did very well today with his session. He brought in his Hurry cane which he used on right side with good sequencing.  Recommended he stay with the FWW and we will reassess next week.   OBJECTIVE IMPAIRMENTS: Abnormal gait, decreased activity tolerance, decreased balance, decreased mobility, difficulty walking, decreased ROM, decreased strength, hypomobility, increased edema, and pain.   ACTIVITY LIMITATIONS:  standing, squatting, stairs, transfers, and locomotion level  PARTICIPATION LIMITATIONS: driving, shopping, community activity, and yard work  PERSONAL FACTORS: Past/current experiences, Transportation, and 3+ comorbidities: Allergies (latex and adhesive), hypertension, history of DVTs, osteoarthritis, history of a CVA, history of cancer, asthma, diabetes, and fibromyalgia are also affecting patient's functional outcome.   REHAB POTENTIAL: Good  CLINICAL DECISION MAKING: Evolving/moderate complexity  EVALUATION COMPLEXITY: Moderate   GOALS: Goals reviewed with patient? Yes  LONG TERM GOALS: Target date: 05/02/24  Patient will be independent with his HEP. Baseline:  Goal status: INITIAL  2.  Patient will be able to demonstrate at least 120 degrees of active left knee flexion for improved function navigating stairs. Baseline:  Goal status: INITIAL  3.  Patient will improve his active left knee extension to within 5 degrees of neutral for improved gait mechanics. Baseline:  Goal status: INITIAL  4.  Patient will be able to navigate at least 4 steps with a reciprocal pattern for improved household mobility. Baseline:  Goal status: INITIAL  5.  Patient will be able to ambulate at least 80 feet with minimal to no significant gait deviations for improved household mobility. Baseline:  Goal status: INITIAL  6.  Patient will improve his LEFS score to 45/80 or greater for improved perceived function with his daily activities.  Baseline:  Goal status: INITIAL   PLAN:  PT FREQUENCY: 2x/week  PT DURATION: 4 weeks  PLANNED INTERVENTIONS: 02835- PT  Re-evaluation, 97750- Physical Performance Testing, 97110-Therapeutic exercises, 97530- Therapeutic activity, [id number]- Neuromuscular re-education, [phone]- Self Care, 02859- Manual therapy, [phone]- Gait training, [phone]- Electrical stimulation (unattended), 97016- Vasopneumatic device, Patient/Family education, Balance training, Stair training,  Joint mobilization, Cryotherapy, and Moist heat  PLAN FOR NEXT SESSION: Nustep, quad sets, heel slides, lower extremity strengthening, manual therapy, and modalities as needed   [place] Scheiderer, [place], PT 04/05/2024, 12:22 PM

## 2024-04-08 ENCOUNTER — Ambulatory Visit

## 2024-04-08 DIAGNOSIS — M25562 Pain in left knee: Secondary | ICD-10-CM | POA: Diagnosis not present

## 2024-04-08 DIAGNOSIS — R600 Localized edema: Secondary | ICD-10-CM

## 2024-04-08 DIAGNOSIS — M25662 Stiffness of left knee, not elsewhere classified: Secondary | ICD-10-CM

## 2024-04-08 NOTE — Therapy (Signed)
 OUTPATIENT PHYSICAL THERAPY LOWER EXTREMITY TREATMENT   Patient Name: Joel Kim MRN: [id number] DOB:Jul 08, 1946, 78 y.o., male Today's Date: 04/08/2024  END OF SESSION:  PT End of Session - 04/08/24 1355     Visit Number 3    Number of Visits 8    Date for PT Re-Evaluation 05/03/24    PT Start Time 1353   Patient arrived late to his appointment.   PT Stop Time 1440    PT Time Calculation (min) 47 min    Activity Tolerance Patient tolerated treatment well    Behavior During Therapy WFL for tasks assessed/performed           Past Medical History:  Diagnosis Date   Anemia    Arthritis    Asthma    Atypical nevus 01/26/2006   right side of body (slight)   Basal cell carcinoma 11/17/2015   left ear rim tx cx3 5fu   Cancer (HCC)    Coronary artery disease    Diabetes mellitus without complication (HCC)    Fibromyalgia    GERD (gastroesophageal reflux disease)    Heart murmur    History of kidney stones    Hypertension    Hypothyroidism    Insomnia    Intractable migraine without aura    Melanoma (HCC) 10/10/2014   right ant. shoulder (exc)   Neuromuscular disorder (HCC)    feet   Pancreatitis    Pneumonia    PONV (postoperative nausea and vomiting)    Pre-diabetes    Prostate CA (HCC)    Sleep apnea    hx of no longer has per   Spondylosis of lumbar spine    Stroke (HCC) 2021   TIA x 2 some memory loss.   Thyroid disease    Past Surgical History:  Procedure Laterality Date   BACK SURGERY  2013   L4-L5 and a redo in 2014 after a fall   CHOLECYSTECTOMY  1992   FOOT SURGERY Left 1999   IVC FILTER INSERTION N/A 03/18/2024   Procedure: IVC FILTER INSERTION;  Surgeon: Sheree Penne Bruckner, MD;  Location: [facility] INVASIVE CV LAB;  Service: Cardiovascular;  Laterality: N/A;   MOHS SURGERY     PROSTATECTOMY  2008   REIMPLANTATION OF TOTAL KNEE Left 03/27/2024   Procedure: REVISION, TOTAL ARTHROPLASTY, KNEE;  Surgeon: Fidel Rogue, MD;  Location: [facility];   Service: Orthopedics;  Laterality: Left;   ROTATOR CUFF REPAIR Left 2021   SPINAL CORD STIMULATOR IMPLANT  2020   TOTAL KNEE ARTHROPLASTY Right 2005   TOTAL KNEE ARTHROPLASTY Left 01/19/2022   Procedure: TOTAL KNEE ARTHROPLASTY;  Surgeon: Gerome Charleston, MD;  Location: [facility];  Service: Orthopedics;  Laterality: Left;  adductor canal 120   Patient Active Problem List   Diagnosis Date Noted   S/P revision of total knee, 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   Sepsis due to undetermined organism (HCC) 04/23/2023   Chronic migraine without aura, intractable, with status migrainosus 03/09/2023   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HCC) 01/25/2022   S/P total knee arthroplasty, 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   Hypokalemia 01/25/2022   Acute anemia 01/25/2022   Acute metabolic encephalopathy 01/25/2022   Osteoarthritis of left knee 01/19/2022   Atypical chest pain 08/15/2018   Hyperlipidemia 08/15/2018   Family history of heart disease 08/15/2018   Hypertension 05/31/2018   Hypothyroidism 05/31/2018   Acute pancreatitis 05/31/2018   Mild renal insufficiency 05/31/2018   REFERRING PROVIDER: Leigh Serge PA-C  REFERRING DIAG: Presence of left artificial knee joint  THERAPY  DIAG:  Acute pain of left knee  Stiffness of left knee, not elsewhere classified  Localized edema  Rationale for Evaluation and Treatment: Rehabilitation  ONSET DATE: 03/27/24  SUBJECTIVE:   SUBJECTIVE STATEMENT: Patient reports that his knee is hurting today, but not too bad.   PERTINENT HISTORY: Allergies (latex and adhesive), hypertension, history of DVTs, osteoarthritis, history of a CVA, history of cancer, asthma, diabetes, and fibromyalgia PAIN:  Are you having pain? Yes: NPRS scale: Current: moderate  Best: 3-4/10 Worst: 6/10 Pain location: left knee Pain description: throbbing and aching  Aggravating factors: moving  Relieving factors: medication and ice  PRECAUTIONS: Fall  RED FLAGS: None   WEIGHT BEARING RESTRICTIONS: No  FALLS:  Has  patient fallen in last 6 months? Yes. Number of falls 4; most recent fall was weeks before surgery  LIVING ENVIRONMENT: Lives with: lives with their family Lives in: House/apartment Stairs: Yes: Internal: 13 steps; on left going up; step to pattern Has following equipment at home: Vannie - 2 wheeled  OCCUPATION: retired  PLOF: Independent  PATIENT GOALS: be able to walk and be outside  NEXT MD VISIT: 04/09/24  OBJECTIVE:  Note: Objective measures were completed at Evaluation unless otherwise noted.  DIAGNOSTIC FINDINGS: 03/27/24 left knee x-ray IMPRESSION: Revision left knee arthroplasty without immediate postoperative complication. PATIENT SURVEYS:  LEFS  Extreme difficulty/unable (0), Quite a bit of difficulty (1), Moderate difficulty (2), Little difficulty (3), No difficulty (4) Survey date:  04/04/24  Any of your usual work, housework or school activities 0  2. Usual hobbies, recreational or sporting activities 0  3. Getting into/out of the bath 2  4. Walking between rooms 3  5. Putting on socks/shoes 0  6. Squatting  0  7. Lifting an object, like a bag of groceries from the floor 0  8. Performing light activities around your home 0  9. Performing heavy activities around your home 0  10. Getting into/out of a car 2  11. Walking 2 blocks 0  12. Walking 1 mile 0  13. Going up/down 10 stairs (1 flight) 0  14. Standing for 1 hour 0  15.  sitting for 1 hour 4  16. Running on even ground 0  17. Running on uneven ground 0  18. Making sharp turns while running fast 0  19. Hopping  0  20. Rolling over in bed 2  Score total:  13/80     COGNITION: Overall cognitive status: Within functional limits for tasks assessed     SENSATION: Patient reports no numbness or tingling  EDEMA:  Circumferential: Left tibiofemoral joint line: 46.0 cm Right tibiofemoral joint line: 40.4 cm   PALPATION: TTP: left hip adductors  LOWER EXTREMITY ROM:  Active ROM Right eval Left eval  Left 04/08/24  Hip flexion     Hip extension     Hip abduction     Hip adduction     Hip internal rotation     Hip external rotation     Knee flexion 129 85 PROM: 89  92 PROM: 94  Knee extension 0 17; limited by pain  10  Ankle dorsiflexion     Ankle plantarflexion     Ankle inversion     Ankle eversion      (Blank rows = not tested)  LOWER EXTREMITY MMT: not tested due to surgical condition  GAIT: Assistive device utilized: Environmental consultant - 2 wheeled Level of assistance: Modified independence Comments: Decreased gait speed with the left knee flexed in stance  phase and decreased stride length                                                                                                                                TREATMENT DATE:                                     04/08/24 EXERCISE LOG  Exercise Repetitions and Resistance Comments  Nustep  L4 x 16 minutes @ seat 9-8   Goal assessment    SLR  10 reps    Lunges onto step  14 step x 2 minutes    Rocker board  5 minutes    Blank cell = exercise not performed today  Modalities: no redness or adverse reaction to today's modalities  Date:  Vaso: Knee, 34 degrees; low pressure, 15 mins, Pain and Edema  04/05/24:  EXERCISE LOG  Exercise Repetitions and Resistance Comments  Nustep 15 minutes for flexion and seat back x 2 minutes for extension   LAQ's 2# x 3 minutes               PROM into flexion and extension x 6 minutes f/b LE elevation with vasopneumatic x 20 minutes.     PATIENT EDUCATION:  Education details: Plan of care, objective findings, healing, x-ray results, prognosis, and goals for physical therapy Person educated: Patient and Spouse Education method: Explanation Education comprehension: verbalized understanding  HOME EXERCISE PROGRAM: Reviewed postoperative HEP  ASSESSMENT:  CLINICAL IMPRESSION: Patient is making good progress with skilled physical therapy as evidenced by his improved left knee active  range of motion and functional mobility. He was able to demonstrate improved left knee flexion and extension since his initial evaluation on 04/04/24. However, he has yet to meet his long-term goals for physical therapy. Today's treatment focused on improved left knee mobility through the use of new and familiar interventions. He reported that his knee felt good upon the conclusion of treatment. Recommend that he continue with his current plan of care to address his remaining impairments to return to his prior level of function.  OBJECTIVE IMPAIRMENTS: Abnormal gait, decreased activity tolerance, decreased balance, decreased mobility, difficulty walking, decreased ROM, decreased strength, hypomobility, increased edema, and pain.   ACTIVITY LIMITATIONS: standing, squatting, stairs, transfers, and locomotion level  PARTICIPATION LIMITATIONS: driving, shopping, community activity, and yard work  PERSONAL FACTORS: Past/current experiences, Transportation, and 3+ comorbidities: Allergies (latex and adhesive), hypertension, history of DVTs, osteoarthritis, history of a CVA, history of cancer, asthma, diabetes, and fibromyalgia are also affecting patient's functional outcome.   REHAB POTENTIAL: Good  CLINICAL DECISION MAKING: Evolving/moderate complexity  EVALUATION COMPLEXITY: Moderate   GOALS: Goals reviewed with patient? Yes  LONG TERM GOALS: Target date: 05/02/24  Patient will be independent with his HEP. Baseline:  Goal status: ON GOING  2.  Patient will be able to demonstrate at  least 120 degrees of active left knee flexion for improved function navigating stairs. Baseline:  Goal status: ON GOING  3.  Patient will improve his active left knee extension to within 5 degrees of neutral for improved gait mechanics. Baseline:  Goal status: ON GOING  4.  Patient will be able to navigate at least 4 steps with a reciprocal pattern for improved household mobility. Baseline: step to pattern   Goal status: ON GOING  5.  Patient will be able to ambulate at least 80 feet with minimal to no significant gait deviations for improved household mobility. Baseline:  Goal status: ON GOING  6.  Patient will improve his LEFS score to 45/80 or greater for improved perceived function with his daily activities.  Baseline:  Goal status: INITIAL   PLAN:  PT FREQUENCY: 2x/week  PT DURATION: 4 weeks  PLANNED INTERVENTIONS: 02835- PT Re-evaluation, 97750- Physical Performance Testing, 97110-Therapeutic exercises, 97530- Therapeutic activity, [id number]- Neuromuscular re-education, 97535- Self Care, 02859- Manual therapy, [phone]- Gait training, [phone]- Electrical stimulation (unattended), 97016- Vasopneumatic device, Patient/Family education, Balance training, Stair training, Joint mobilization, Cryotherapy, and Moist heat  PLAN FOR NEXT SESSION: Nustep, quad sets, heel slides, lower extremity strengthening, manual therapy, and modalities as needed   Lacinda JAYSON Fass, PT 04/08/2024, 4:56 PM

## 2024-04-11 ENCOUNTER — Ambulatory Visit

## 2024-04-11 DIAGNOSIS — R600 Localized edema: Secondary | ICD-10-CM

## 2024-04-11 DIAGNOSIS — M25562 Pain in left knee: Secondary | ICD-10-CM | POA: Diagnosis not present

## 2024-04-11 DIAGNOSIS — M25662 Stiffness of left knee, not elsewhere classified: Secondary | ICD-10-CM

## 2024-04-11 NOTE — Therapy (Signed)
 OUTPATIENT PHYSICAL THERAPY LOWER EXTREMITY TREATMENT   Patient Name: Joel Kim MRN: [id number] DOB:[birth year], 78 y.o., male Today's Date: 04/11/2024  END OF SESSION:  PT End of Session - 04/11/24 1104     Visit Number 4    Number of Visits 8    Date for PT Re-Evaluation 05/03/24    PT Start Time 1058    PT Stop Time 1145    PT Time Calculation (min) 47 min    Activity Tolerance Patient tolerated treatment well    Behavior During Therapy [facility] for tasks assessed/performed            Past Medical History:  Diagnosis Date   Anemia    Arthritis    Asthma    Atypical nevus 01/26/2006   right side of body (slight)   Basal cell carcinoma 11/17/2015   left ear rim tx cx3 66fu   Cancer (HCC)    Coronary artery disease    Diabetes mellitus without complication (HCC)    Fibromyalgia    GERD (gastroesophageal reflux disease)    Heart murmur    History of kidney stones    Hypertension    Hypothyroidism    Insomnia    Intractable migraine without aura    Melanoma (HCC) 10/10/2014   right ant. shoulder (exc)   Neuromuscular disorder (HCC)    feet   Pancreatitis    Pneumonia    PONV (postoperative nausea and vomiting)    Pre-diabetes    Prostate CA (HCC)    Sleep apnea    hx of no longer has per   Spondylosis of lumbar spine    Stroke (HCC) 2021   TIA x 2 some memory loss.   Thyroid disease    Past Surgical History:  Procedure Laterality Date   BACK SURGERY  2013   L4-L5 and a redo in 2014 after a fall   CHOLECYSTECTOMY  1992   FOOT SURGERY Left 1999   IVC FILTER INSERTION N/A 03/18/2024   Procedure: IVC FILTER INSERTION;  Surgeon: Sheree Penne Bruckner, MD;  Location: [facility] INVASIVE CV LAB;  Service: Cardiovascular;  Laterality: N/A;   MOHS SURGERY     PROSTATECTOMY  2008   REIMPLANTATION OF TOTAL KNEE Left 03/27/2024   Procedure: REVISION, TOTAL ARTHROPLASTY, KNEE;  Surgeon: Fidel Rogue, MD;  Location: [facility];  Service: Orthopedics;  Laterality: Left;    ROTATOR CUFF REPAIR Left 2021   SPINAL CORD STIMULATOR IMPLANT  2020   TOTAL KNEE ARTHROPLASTY Right 2005   TOTAL KNEE ARTHROPLASTY Left 01/19/2022   Procedure: TOTAL KNEE ARTHROPLASTY;  Surgeon: Gerome Charleston, MD;  Location: [facility];  Service: Orthopedics;  Laterality: Left;  adductor canal 120   Patient Active Problem List   Diagnosis Date Noted   S/P revision of total knee, 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   Sepsis due to undetermined organism (HCC) 04/23/2023   Chronic migraine without aura, intractable, with status migrainosus 03/09/2023   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HCC) 01/25/2022   S/P total knee arthroplasty, 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   Hypokalemia 01/25/2022   Acute anemia 01/25/2022   Acute metabolic encephalopathy 01/25/2022   Osteoarthritis of left knee 01/19/2022   Atypical chest pain 08/15/2018   Hyperlipidemia 08/15/2018   Family history of heart disease 08/15/2018   Hypertension 05/31/2018   Hypothyroidism 05/31/2018   Acute pancreatitis 05/31/2018   Mild renal insufficiency 05/31/2018   REFERRING PROVIDER: Leigh Serge PA-C  REFERRING DIAG: Presence of left artificial knee joint  THERAPY DIAG:  Acute pain of left  knee  Stiffness of left knee, not elsewhere classified  Localized edema  Rationale for Evaluation and Treatment: Rehabilitation  ONSET DATE: 03/27/24  SUBJECTIVE:   SUBJECTIVE STATEMENT: Patient reports that his knee is hurting a little today.  PERTINENT HISTORY: Allergies (latex and adhesive), hypertension, history of DVTs, osteoarthritis, history of a CVA, history of cancer, asthma, diabetes, and fibromyalgia PAIN:  Are you having pain? Yes: NPRS scale: Current: a little  Best: 3-4/10 Worst: 6/10 Pain location: left knee Pain description: throbbing and aching  Aggravating factors: moving  Relieving factors: medication and ice  PRECAUTIONS: Fall  RED FLAGS: None   WEIGHT BEARING RESTRICTIONS: No  FALLS:  Has patient fallen in last 6 months? Yes. Number of falls 4;  most recent fall was weeks before surgery  LIVING ENVIRONMENT: Lives with: lives with their family Lives in: House/apartment Stairs: Yes: Internal: 13 steps; on left going up; step to pattern Has following equipment at home: Vannie - 2 wheeled  OCCUPATION: retired  PLOF: Independent  PATIENT GOALS: be able to walk and be outside  NEXT MD VISIT: 04/09/24  OBJECTIVE:  Note: Objective measures were completed at Evaluation unless otherwise noted.  DIAGNOSTIC FINDINGS: 03/27/24 left knee x-ray IMPRESSION: Revision left knee arthroplasty without immediate postoperative complication. PATIENT SURVEYS:  LEFS  Extreme difficulty/unable (0), Quite a bit of difficulty (1), Moderate difficulty (2), Little difficulty (3), No difficulty (4) Survey date:  04/04/24  Any of your usual work, housework or school activities 0  2. Usual hobbies, recreational or sporting activities 0  3. Getting into/out of the bath 2  4. Walking between rooms 3  5. Putting on socks/shoes 0  6. Squatting  0  7. Lifting an object, like a bag of groceries from the floor 0  8. Performing light activities around your home 0  9. Performing heavy activities around your home 0  10. Getting into/out of a car 2  11. Walking 2 blocks 0  12. Walking 1 mile 0  13. Going up/down 10 stairs (1 flight) 0  14. Standing for 1 hour 0  15.  sitting for 1 hour 4  16. Running on even ground 0  17. Running on uneven ground 0  18. Making sharp turns while running fast 0  19. Hopping  0  20. Rolling over in bed 2  Score total:  13/80     COGNITION: Overall cognitive status: Within functional limits for tasks assessed     SENSATION: Patient reports no numbness or tingling  EDEMA:  Circumferential: Left tibiofemoral joint line: 46.0 cm Right tibiofemoral joint line: 40.4 cm   PALPATION: TTP: left hip adductors  LOWER EXTREMITY ROM:  Active ROM Right eval Left eval Left 04/08/24  Hip flexion     Hip extension     Hip  abduction     Hip adduction     Hip internal rotation     Hip external rotation     Knee flexion 129 85 PROM: 89  92 PROM: 94  Knee extension 0 17; limited by pain  10  Ankle dorsiflexion     Ankle plantarflexion     Ankle inversion     Ankle eversion      (Blank rows = not tested)  LOWER EXTREMITY MMT: not tested due to surgical condition  GAIT: Assistive device utilized: Environmental consultant - 2 wheeled Level of assistance: Modified independence Comments: Decreased gait speed with the left knee flexed in stance phase and decreased stride length  TREATMENT DATE:                                     04/11/24 EXERCISE LOG  Exercise Repetitions and Resistance Comments  Nustep  L4 x 15 minutes @ seat 6   Rocker board  5 minutes   Lunges onto a step  6 step x 3 minutes   Step up  6 step x 15 reps  Leading with LLE        Blank cell = exercise not performed today  Modalities: no redness or adverse reaction to today's modalities  Date:  Vaso: Knee, 34 degrees; low pressure, 15 mins, Pain and Edema                                   04/08/24 EXERCISE LOG  Exercise Repetitions and Resistance Comments  Nustep  L4 x 16 minutes @ seat 9-8   Goal assessment    SLR  10 reps    Lunges onto step  14 step x 2 minutes    Rocker board  5 minutes    Blank cell = exercise not performed today  Modalities: no redness or adverse reaction to today's modalities  Date:  Vaso: Knee, 34 degrees; low pressure, 15 mins, Pain and Edema  04/05/24:  EXERCISE LOG  Exercise Repetitions and Resistance Comments  Nustep 15 minutes for flexion and seat back x 2 minutes for extension   LAQ's 2# x 3 minutes               PROM into flexion and extension x 6 minutes f/b LE elevation with vasopneumatic x 20 minutes.     PATIENT EDUCATION:  Education details: Plan of care, objective  findings, healing, x-ray results, prognosis, and goals for physical therapy Person educated: Patient and Spouse Education method: Explanation Education comprehension: verbalized understanding  HOME EXERCISE PROGRAM: Reviewed postoperative HEP  ASSESSMENT:  CLINICAL IMPRESSION: Today's treatment focused on improved knee mobility through the use of familiar interventions. He required minimal cueing with step ups to prevent hip circumduction to promote proper gait mechanics. He experienced no increase in pain or discomfort with any of today's interventions. He reported that his knee felt good upon the conclusion of treatment. He continues to require skilled physical therapy to address his remaining impairments to return to his prior level of function.   OBJECTIVE IMPAIRMENTS: Abnormal gait, decreased activity tolerance, decreased balance, decreased mobility, difficulty walking, decreased ROM, decreased strength, hypomobility, increased edema, and pain.   ACTIVITY LIMITATIONS: standing, squatting, stairs, transfers, and locomotion level  PARTICIPATION LIMITATIONS: driving, shopping, community activity, and yard work  PERSONAL FACTORS: Past/current experiences, Transportation, and 3+ comorbidities: Allergies (latex and adhesive), hypertension, history of DVTs, osteoarthritis, history of a CVA, history of cancer, asthma, diabetes, and fibromyalgia are also affecting patient's functional outcome.   REHAB POTENTIAL: Good  CLINICAL DECISION MAKING: Evolving/moderate complexity  EVALUATION COMPLEXITY: Moderate   GOALS: Goals reviewed with patient? Yes  LONG TERM GOALS: Target date: 05/02/24  Patient will be independent with his HEP. Baseline:  Goal status: ON GOING  2.  Patient will be able to demonstrate at least 120 degrees of active left knee flexion for improved function navigating stairs. Baseline:  Goal status: ON GOING  3.  Patient will improve his active left knee extension to  within 5 degrees of  neutral for improved gait mechanics. Baseline:  Goal status: ON GOING  4.  Patient will be able to navigate at least 4 steps with a reciprocal pattern for improved household mobility. Baseline: step to pattern  Goal status: ON GOING  5.  Patient will be able to ambulate at least 80 feet with minimal to no significant gait deviations for improved household mobility. Baseline:  Goal status: ON GOING  6.  Patient will improve his LEFS score to 45/80 or greater for improved perceived function with his daily activities.  Baseline:  Goal status: INITIAL   PLAN:  PT FREQUENCY: 2x/week  PT DURATION: 4 weeks  PLANNED INTERVENTIONS: 02835- PT Re-evaluation, 97750- Physical Performance Testing, 97110-Therapeutic exercises, 97530- Therapeutic activity, [id number]- Neuromuscular re-education, 97535- Self Care, 02859- Manual therapy, [phone]- Gait training, [phone]- Electrical stimulation (unattended), 97016- Vasopneumatic device, Patient/Family education, Balance training, Stair training, Joint mobilization, Cryotherapy, and Moist heat  PLAN FOR NEXT SESSION: Nustep, quad sets, heel slides, lower extremity strengthening, manual therapy, and modalities as needed   Lacinda JAYSON Fass, PT 04/11/2024, 3:10 PM

## 2024-04-15 ENCOUNTER — Ambulatory Visit

## 2024-04-15 DIAGNOSIS — M25662 Stiffness of left knee, not elsewhere classified: Secondary | ICD-10-CM

## 2024-04-15 DIAGNOSIS — R600 Localized edema: Secondary | ICD-10-CM

## 2024-04-15 DIAGNOSIS — M25562 Pain in left knee: Secondary | ICD-10-CM

## 2024-04-15 NOTE — Therapy (Signed)
 OUTPATIENT PHYSICAL THERAPY LOWER EXTREMITY TREATMENT   Patient Name: Joel Kim MRN: [id number] DOB:1946-08-04, 78 y.o., male Today's Date: 04/15/2024  END OF SESSION:  PT End of Session - 04/15/24 1445     Visit Number 5    Number of Visits 8    Date for PT Re-Evaluation 05/03/24    PT Start Time 1430    PT Stop Time 1511    PT Time Calculation (min) 41 min    Activity Tolerance Patient tolerated treatment well    Behavior During Therapy [facility] for tasks assessed/performed             Past Medical History:  Diagnosis Date   Anemia    Arthritis    Asthma    Atypical nevus 01/26/2006   right side of body (slight)   Basal cell carcinoma 11/17/2015   left ear rim tx cx3 56fu   Cancer (HCC)    Coronary artery disease    Diabetes mellitus without complication (HCC)    Fibromyalgia    GERD (gastroesophageal reflux disease)    Heart murmur    History of kidney stones    Hypertension    Hypothyroidism    Insomnia    Intractable migraine without aura    Melanoma (HCC) 10/10/2014   right ant. shoulder (exc)   Neuromuscular disorder (HCC)    feet   Pancreatitis    Pneumonia    PONV (postoperative nausea and vomiting)    Pre-diabetes    Prostate CA (HCC)    Sleep apnea    hx of no longer has per   Spondylosis of lumbar spine    Stroke (HCC) 2021   TIA x 2 some memory loss.   Thyroid disease    Past Surgical History:  Procedure Laterality Date   BACK SURGERY  2013   L4-L5 and a redo in 2014 after a fall   CHOLECYSTECTOMY  1992   FOOT SURGERY Left 1999   IVC FILTER INSERTION N/A 03/18/2024   Procedure: IVC FILTER INSERTION;  Surgeon: Sheree Penne Bruckner, MD;  Location: [facility] INVASIVE CV LAB;  Service: Cardiovascular;  Laterality: N/A;   MOHS SURGERY     PROSTATECTOMY  2008   REIMPLANTATION OF TOTAL KNEE Left 03/27/2024   Procedure: REVISION, TOTAL ARTHROPLASTY, KNEE;  Surgeon: Fidel Rogue, MD;  Location: [facility];  Service: Orthopedics;  Laterality:  Left;   ROTATOR CUFF REPAIR Left 2021   SPINAL CORD STIMULATOR IMPLANT  2020   TOTAL KNEE ARTHROPLASTY Right 2005   TOTAL KNEE ARTHROPLASTY Left 01/19/2022   Procedure: TOTAL KNEE ARTHROPLASTY;  Surgeon: Gerome Charleston, MD;  Location: [facility];  Service: Orthopedics;  Laterality: Left;  adductor canal 120   Patient Active Problem List   Diagnosis Date Noted   S/P revision of total knee, 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   Sepsis due to undetermined organism (HCC) 04/23/2023   Chronic migraine without aura, intractable, with status migrainosus 03/09/2023   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HCC) 01/25/2022   S/P total knee arthroplasty, 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   Hypokalemia 01/25/2022   Acute anemia 01/25/2022   Acute metabolic encephalopathy 01/25/2022   Osteoarthritis of left knee 01/19/2022   Atypical chest pain 08/15/2018   Hyperlipidemia 08/15/2018   Family history of heart disease 08/15/2018   Hypertension 05/31/2018   Hypothyroidism 05/31/2018   Acute pancreatitis 05/31/2018   Mild renal insufficiency 05/31/2018   REFERRING PROVIDER: Leigh Serge PA-C  REFERRING DIAG: Presence of left artificial knee joint  THERAPY DIAG:  Acute pain of  left knee  Stiffness of left knee, not elsewhere classified  Localized edema  Rationale for Evaluation and Treatment: Rehabilitation  ONSET DATE: 03/27/24  SUBJECTIVE:   SUBJECTIVE STATEMENT: Patient reports that his knee still hurts everyday.   PERTINENT HISTORY: Allergies (latex and adhesive), hypertension, history of DVTs, osteoarthritis, history of a CVA, history of cancer, asthma, diabetes, and fibromyalgia PAIN:  Are you having pain? Yes: NPRS scale: Current: 6/10  Best: 3-4/10 Worst: 6/10 Pain location: left knee Pain description: throbbing and aching  Aggravating factors: moving  Relieving factors: medication and ice  PRECAUTIONS: Fall  RED FLAGS: None   WEIGHT BEARING RESTRICTIONS: No  FALLS:  Has patient fallen in last 6 months? Yes. Number of falls 4;  most recent fall was weeks before surgery  LIVING ENVIRONMENT: Lives with: lives with their family Lives in: House/apartment Stairs: Yes: Internal: 13 steps; on left going up; step to pattern Has following equipment at home: Vannie - 2 wheeled  OCCUPATION: retired  PLOF: Independent  PATIENT GOALS: be able to walk and be outside  NEXT MD VISIT: 04/09/24  OBJECTIVE:  Note: Objective measures were completed at Evaluation unless otherwise noted.  DIAGNOSTIC FINDINGS: 03/27/24 left knee x-ray IMPRESSION: Revision left knee arthroplasty without immediate postoperative complication. PATIENT SURVEYS:  LEFS  Extreme difficulty/unable (0), Quite a bit of difficulty (1), Moderate difficulty (2), Little difficulty (3), No difficulty (4) Survey date:  04/04/24  Any of your usual work, housework or school activities 0  2. Usual hobbies, recreational or sporting activities 0  3. Getting into/out of the bath 2  4. Walking between rooms 3  5. Putting on socks/shoes 0  6. Squatting  0  7. Lifting an object, like a bag of groceries from the floor 0  8. Performing light activities around your home 0  9. Performing heavy activities around your home 0  10. Getting into/out of a car 2  11. Walking 2 blocks 0  12. Walking 1 mile 0  13. Going up/down 10 stairs (1 flight) 0  14. Standing for 1 hour 0  15.  sitting for 1 hour 4  16. Running on even ground 0  17. Running on uneven ground 0  18. Making sharp turns while running fast 0  19. Hopping  0  20. Rolling over in bed 2  Score total:  13/80     COGNITION: Overall cognitive status: Within functional limits for tasks assessed     SENSATION: Patient reports no numbness or tingling  EDEMA:  Circumferential: Left tibiofemoral joint line: 46.0 cm Right tibiofemoral joint line: 40.4 cm   PALPATION: TTP: left hip adductors  LOWER EXTREMITY ROM:  Active ROM Right eval Left eval Left 04/08/24  Hip flexion     Hip extension     Hip  abduction     Hip adduction     Hip internal rotation     Hip external rotation     Knee flexion 129 85 PROM: 89  92 PROM: 94  Knee extension 0 17; limited by pain  10  Ankle dorsiflexion     Ankle plantarflexion     Ankle inversion     Ankle eversion      (Blank rows = not tested)  LOWER EXTREMITY MMT: not tested due to surgical condition  GAIT: Assistive device utilized: Environmental consultant - 2 wheeled Level of assistance: Modified independence Comments: Decreased gait speed with the left knee flexed in stance phase and decreased stride length  TREATMENT DATE:                                     04/15/24 EXERCISE LOG  Exercise Repetitions and Resistance Comments  Nustep  L4 x 16 minutes @ seat 7-6   Rocker board  4 minutes   Lunges onto step  14 step x 3 minutes   Standing marching 20 reps   LAQ 2 minutes  LLE only   Seated heel slide  25 reps   Seated hip ADD isometric  2.5 minutes w/ 5 seconds    Blank cell = exercise not performed today                                    04/11/24 EXERCISE LOG  Exercise Repetitions and Resistance Comments  Nustep  L4 x 15 minutes @ seat 6   Rocker board  5 minutes   Lunges onto a step  6 step x 3 minutes   Step up  6 step x 15 reps  Leading with LLE        Blank cell = exercise not performed today  Modalities: no redness or adverse reaction to today's modalities  Date:  Vaso: Knee, 34 degrees; low pressure, 15 mins, Pain and Edema                                   04/08/24 EXERCISE LOG  Exercise Repetitions and Resistance Comments  Nustep  L4 x 16 minutes @ seat 9-8   Goal assessment    SLR  10 reps    Lunges onto step  14 step x 2 minutes    Rocker board  5 minutes    Blank cell = exercise not performed today  Modalities: no redness or adverse reaction to today's modalities  Date:  Vaso: Knee, 34  degrees; low pressure, 15 mins, Pain and Edema  PATIENT EDUCATION:  Education details:  Person educated: Patient and Spouse Education method: Explanation Education comprehension: verbalized understanding  HOME EXERCISE PROGRAM: Reviewed postoperative HEP  ASSESSMENT:  CLINICAL IMPRESSION: Patient was progressed with long arc quads and standing marching to facilitate improved quadriceps and hip flexor engagement needed for functional activities such as navigating stairs. He required minimal cueing with these interventions for proper biomechanics to promote proper muscular engagement. He reported feeling tired upon the conclusion of treatment. He continues to require skilled physical therapy to address his remaining impairments to return to his prior level of function.    OBJECTIVE IMPAIRMENTS: Abnormal gait, decreased activity tolerance, decreased balance, decreased mobility, difficulty walking, decreased ROM, decreased strength, hypomobility, increased edema, and pain.   ACTIVITY LIMITATIONS: standing, squatting, stairs, transfers, and locomotion level  PARTICIPATION LIMITATIONS: driving, shopping, community activity, and yard work  PERSONAL FACTORS: Past/current experiences, Transportation, and 3+ comorbidities: Allergies (latex and adhesive), hypertension, history of DVTs, osteoarthritis, history of a CVA, history of cancer, asthma, diabetes, and fibromyalgia are also affecting patient's functional outcome.   REHAB POTENTIAL: Good  CLINICAL DECISION MAKING: Evolving/moderate complexity  EVALUATION COMPLEXITY: Moderate   GOALS: Goals reviewed with patient? Yes  LONG TERM GOALS: Target date: 05/02/24  Patient will be independent with his HEP. Baseline:  Goal status: ON GOING  2.  Patient will be able to demonstrate at least  120 degrees of active left knee flexion for improved function navigating stairs. Baseline:  Goal status: ON GOING  3.  Patient will improve his active  left knee extension to within 5 degrees of neutral for improved gait mechanics. Baseline:  Goal status: ON GOING  4.  Patient will be able to navigate at least 4 steps with a reciprocal pattern for improved household mobility. Baseline: step to pattern  Goal status: ON GOING  5.  Patient will be able to ambulate at least 80 feet with minimal to no significant gait deviations for improved household mobility. Baseline:  Goal status: ON GOING  6.  Patient will improve his LEFS score to 45/80 or greater for improved perceived function with his daily activities.  Baseline:  Goal status: INITIAL   PLAN:  PT FREQUENCY: 2x/week  PT DURATION: 4 weeks  PLANNED INTERVENTIONS: 02835- PT Re-evaluation, 97750- Physical Performance Testing, 97110-Therapeutic exercises, 97530- Therapeutic activity, [id number]- Neuromuscular re-education, 97535- Self Care, 02859- Manual therapy, [phone]- Gait training, [phone]- Electrical stimulation (unattended), 97016- Vasopneumatic device, Patient/Family education, Balance training, Stair training, Joint mobilization, Cryotherapy, and Moist heat  PLAN FOR NEXT SESSION: Nustep, quad sets, heel slides, lower extremity strengthening, manual therapy, and modalities as needed   Lacinda JAYSON Fass, PT 04/15/2024, 4:52 PM

## 2024-04-18 ENCOUNTER — Ambulatory Visit: Payer: PRIVATE HEALTH INSURANCE | Admitting: *Deleted

## 2024-04-18 ENCOUNTER — Encounter: Payer: Self-pay | Admitting: *Deleted

## 2024-04-18 DIAGNOSIS — R600 Localized edema: Secondary | ICD-10-CM

## 2024-04-18 DIAGNOSIS — M25562 Pain in left knee: Secondary | ICD-10-CM

## 2024-04-18 DIAGNOSIS — M25662 Stiffness of left knee, not elsewhere classified: Secondary | ICD-10-CM

## 2024-04-18 DIAGNOSIS — M6281 Muscle weakness (generalized): Secondary | ICD-10-CM

## 2024-04-18 DIAGNOSIS — Z9181 History of falling: Secondary | ICD-10-CM

## 2024-04-18 NOTE — Therapy (Signed)
 OUTPATIENT PHYSICAL THERAPY LOWER EXTREMITY TREATMENT   Patient Name: Joel Kim MRN: [id number] DOB:08-17-1946, 78 y.o., male Today's Date: 04/18/2024  END OF SESSION:  PT End of Session - 04/18/24 1438     Visit Number 6    Number of Visits 8    Date for PT Re-Evaluation 05/03/24    PT Start Time 1430    PT Stop Time 1517    PT Time Calculation (min) 47 min             Past Medical History:  Diagnosis Date   Anemia    Arthritis    Asthma    Atypical nevus 01/26/2006   right side of body (slight)   Basal cell carcinoma 11/17/2015   left ear rim tx cx3 51fu   Cancer (HCC)    Coronary artery disease    Diabetes mellitus without complication (HCC)    Fibromyalgia    GERD (gastroesophageal reflux disease)    Heart murmur    History of kidney stones    Hypertension    Hypothyroidism    Insomnia    Intractable migraine without aura    Melanoma (HCC) 10/10/2014   right ant. shoulder (exc)   Neuromuscular disorder (HCC)    feet   Pancreatitis    Pneumonia    PONV (postoperative nausea and vomiting)    Pre-diabetes    Prostate CA (HCC)    Sleep apnea    hx of no longer has per   Spondylosis of lumbar spine    Stroke (HCC) 2021   TIA x 2 some memory loss.   Thyroid disease    Past Surgical History:  Procedure Laterality Date   BACK SURGERY  2013   L4-L5 and a redo in 2014 after a fall   CHOLECYSTECTOMY  1992   FOOT SURGERY Left 1999   IVC FILTER INSERTION N/A 03/18/2024   Procedure: IVC FILTER INSERTION;  Surgeon: Sheree Penne Bruckner, MD;  Location: [facility] INVASIVE CV LAB;  Service: Cardiovascular;  Laterality: N/A;   MOHS SURGERY     PROSTATECTOMY  2008   REIMPLANTATION OF TOTAL KNEE Left 03/27/2024   Procedure: REVISION, TOTAL ARTHROPLASTY, KNEE;  Surgeon: Fidel Rogue, MD;  Location: [facility];  Service: Orthopedics;  Laterality: Left;   ROTATOR CUFF REPAIR Left 2021   SPINAL CORD STIMULATOR IMPLANT  2020   TOTAL KNEE ARTHROPLASTY Right 2005    TOTAL KNEE ARTHROPLASTY Left 01/19/2022   Procedure: TOTAL KNEE ARTHROPLASTY;  Surgeon: Gerome Charleston, MD;  Location: [facility];  Service: Orthopedics;  Laterality: Left;  adductor canal 120   Patient Active Problem List   Diagnosis Date Noted   S/P revision of total knee, 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   Sepsis due to undetermined organism (HCC) 04/23/2023   Chronic migraine without aura, intractable, with status migrainosus 03/09/2023   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HCC) 01/25/2022   S/P total knee arthroplasty, 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   Hypokalemia 01/25/2022   Acute anemia 01/25/2022   Acute metabolic encephalopathy 01/25/2022   Osteoarthritis of left knee 01/19/2022   Atypical chest pain 08/15/2018   Hyperlipidemia 08/15/2018   Family history of heart disease 08/15/2018   Hypertension 05/31/2018   Hypothyroidism 05/31/2018   Acute pancreatitis 05/31/2018   Mild renal insufficiency 05/31/2018   REFERRING PROVIDER: Leigh Serge PA-C  REFERRING DIAG: Presence of left artificial knee joint  THERAPY DIAG:  Acute pain of left knee  Stiffness of left knee, not elsewhere classified  Localized edema  History of falling  Muscle  weakness (generalized)  Rationale for Evaluation and Treatment: Rehabilitation  ONSET DATE: 03/27/24  SUBJECTIVE:   SUBJECTIVE STATEMENT: Patient reports that his knee still hurts everyday, but okay. MD said knee looked good and continue with PT  PERTINENT HISTORY: Allergies (latex and adhesive), hypertension, history of DVTs, osteoarthritis, history of a CVA, history of cancer, asthma, diabetes, and fibromyalgia PAIN:  Are you having pain? Yes: NPRS scale: Current: 6/10  Best: 3-4/10 Worst: 6/10 Pain location: left knee Pain description: throbbing and aching  Aggravating factors: moving  Relieving factors: medication and ice  PRECAUTIONS: Fall  RED FLAGS: None   WEIGHT BEARING RESTRICTIONS: No  FALLS:  Has patient fallen in last 6 months? Yes. Number of falls 4; most recent  fall was weeks before surgery  LIVING ENVIRONMENT: Lives with: lives with their family Lives in: House/apartment Stairs: Yes: Internal: 13 steps; on left going up; step to pattern Has following equipment at home: Vannie - 2 wheeled  OCCUPATION: retired  PLOF: Independent  PATIENT GOALS: be able to walk and be outside  NEXT MD VISIT: 04/09/24  OBJECTIVE:  Note: Objective measures were completed at Evaluation unless otherwise noted.  DIAGNOSTIC FINDINGS: 03/27/24 left knee x-ray IMPRESSION: Revision left knee arthroplasty without immediate postoperative complication. PATIENT SURVEYS:  LEFS  Extreme difficulty/unable (0), Quite a bit of difficulty (1), Moderate difficulty (2), Little difficulty (3), No difficulty (4) Survey date:  04/04/24  Any of your usual work, housework or school activities 0  2. Usual hobbies, recreational or sporting activities 0  3. Getting into/out of the bath 2  4. Walking between rooms 3  5. Putting on socks/shoes 0  6. Squatting  0  7. Lifting an object, like a bag of groceries from the floor 0  8. Performing light activities around your home 0  9. Performing heavy activities around your home 0  10. Getting into/out of a car 2  11. Walking 2 blocks 0  12. Walking 1 mile 0  13. Going up/down 10 stairs (1 flight) 0  14. Standing for 1 hour 0  15.  sitting for 1 hour 4  16. Running on even ground 0  17. Running on uneven ground 0  18. Making sharp turns while running fast 0  19. Hopping  0  20. Rolling over in bed 2  Score total:  13/80     COGNITION: Overall cognitive status: Within functional limits for tasks assessed     SENSATION: Patient reports no numbness or tingling  EDEMA:  Circumferential: Left tibiofemoral joint line: 46.0 cm Right tibiofemoral joint line: 40.4 cm   PALPATION: TTP: left hip adductors  LOWER EXTREMITY ROM:  Active ROM Right eval Left eval Left 04/08/24  Hip flexion     Hip extension     Hip abduction      Hip adduction     Hip internal rotation     Hip external rotation     Knee flexion 129 85 PROM: 89  92 PROM: 94  Knee extension 0 17; limited by pain  10  Ankle dorsiflexion     Ankle plantarflexion     Ankle inversion     Ankle eversion      (Blank rows = not tested)  LOWER EXTREMITY MMT: not tested due to surgical condition  GAIT: Assistive device utilized: Environmental consultant - 2 wheeled Level of assistance: Modified independence Comments: Decreased gait speed with the left knee flexed in stance phase and decreased stride length  TREATMENT DATE:                                     04/15/24 EXERCISE LOG    LT knee  Exercise Repetitions and Resistance Comments  Nustep  L4 x 16 minutes @ seat 7-6   Rocker board  5 minutes   Lunges onto step     Standing marching 20 reps   LAQ 2# 3 x 10 with pause at top LLE only   HS curl Green tband 3x10   Seated heel slide     Seated hip ADD isometric  X10 hold 10 secs    Blank cell = exercise not performed today                                    04/11/24 EXERCISE LOG  Exercise Repetitions and Resistance Comments  Nustep  L4 x 15 minutes @ seat 6   Rocker board  5 minutes   Lunges onto a step  6 step x 3 minutes   Step up  6 step x 15 reps  Leading with LLE        Blank cell = exercise not performed today  Modalities: no redness or adverse reaction to today's modalities  Date:  Vaso: Knee, 34 degrees; low pressure, 15 mins, Pain and Edema                                   04/08/24 EXERCISE LOG  Exercise Repetitions and Resistance Comments  Nustep  L4 x 16 minutes @ seat 9-8   Goal assessment    SLR  10 reps    Lunges onto step  14 step x 2 minutes    Rocker board  5 minutes    Blank cell = exercise not performed today  Modalities: no redness or adverse reaction to today's modalities  Date:  Vaso: Knee, 34  degrees; low pressure, 15 mins, Pain and Edema  PATIENT EDUCATION:  Education details:  Person educated: Patient and Spouse Education method: Explanation Education comprehension: verbalized understanding  HOME EXERCISE PROGRAM: Reviewed postoperative HEP  ASSESSMENT:  CLINICAL IMPRESSION: Patient arrived today doing fairly well with LT knee. He was able to continue with LT LE CKC and [place] strengthening exs  as well as balance activities and did fairly well with progressions.    OBJECTIVE IMPAIRMENTS: Abnormal gait, decreased activity tolerance, decreased balance, decreased mobility, difficulty walking, decreased ROM, decreased strength, hypomobility, increased edema, and pain.   ACTIVITY LIMITATIONS: standing, squatting, stairs, transfers, and locomotion level  PARTICIPATION LIMITATIONS: driving, shopping, community activity, and yard work  PERSONAL FACTORS: Past/current experiences, Transportation, and 3+ comorbidities: Allergies (latex and adhesive), hypertension, history of DVTs, osteoarthritis, history of a CVA, history of cancer, asthma, diabetes, and fibromyalgia are also affecting patient's functional outcome.   REHAB POTENTIAL: Good  CLINICAL DECISION MAKING: Evolving/moderate complexity  EVALUATION COMPLEXITY: Moderate   GOALS: Goals reviewed with patient? Yes  LONG TERM GOALS: Target date: 05/02/24  Patient will be independent with his HEP. Baseline:  Goal status: ON GOING  2.  Patient will be able to demonstrate at least 120 degrees of active left knee flexion for improved function navigating stairs. Baseline:  Goal status: ON GOING  3.  Patient will improve his  active left knee extension to within 5 degrees of neutral for improved gait mechanics. Baseline:  Goal status: ON GOING  4.  Patient will be able to navigate at least 4 steps with a reciprocal pattern for improved household mobility. Baseline: step to pattern  Goal status: ON GOING  5.  Patient  will be able to ambulate at least 80 feet with minimal to no significant gait deviations for improved household mobility. Baseline:  Goal status: ON GOING  6.  Patient will improve his LEFS score to 45/80 or greater for improved perceived function with his daily activities.  Baseline:  Goal status: INITIAL   PLAN:  PT FREQUENCY: 2x/week  PT DURATION: 4 weeks  PLANNED INTERVENTIONS: 97164- PT Re-evaluation, 97750- Physical Performance Testing, 97110-Therapeutic exercises, 97530- Therapeutic activity, [id number]- Neuromuscular re-education, 97535- Self Care, 02859- Manual therapy, [phone]- Gait training, [phone]- Electrical stimulation (unattended), 97016- Vasopneumatic device, Patient/Family education, Balance training, Stair training, Joint mobilization, Cryotherapy, and Moist heat  PLAN FOR NEXT SESSION: Nustep, quad sets, heel slides, lower extremity strengthening, manual therapy, and modalities as needed   Briannie Gutierrez,CHRIS, PTA 04/18/2024, 6:28 PM

## 2024-04-22 ENCOUNTER — Ambulatory Visit: Payer: PRIVATE HEALTH INSURANCE | Admitting: Physical Therapy

## 2024-04-22 DIAGNOSIS — M25662 Stiffness of left knee, not elsewhere classified: Secondary | ICD-10-CM

## 2024-04-22 DIAGNOSIS — M25562 Pain in left knee: Secondary | ICD-10-CM

## 2024-04-22 DIAGNOSIS — R600 Localized edema: Secondary | ICD-10-CM

## 2024-04-22 NOTE — Therapy (Signed)
 OUTPATIENT PHYSICAL THERAPY LOWER EXTREMITY TREATMENT   Patient Name: Joel Kim MRN: [id number] DOB:07-Sep-1946, 78 y.o., male Today's Date: 04/22/2024  END OF SESSION:  PT End of Session - 04/22/24 1521     Visit Number 7    Number of Visits 8    Date for PT Re-Evaluation 05/03/24    PT Start Time 0315    PT Stop Time 0400    PT Time Calculation (min) 45 min    Activity Tolerance Patient tolerated treatment well    Behavior During Therapy [facility] for tasks assessed/performed             Past Medical History:  Diagnosis Date   Anemia    Arthritis    Asthma    Atypical nevus 01/26/2006   right side of body (slight)   Basal cell carcinoma 11/17/2015   left ear rim tx cx3 58fu   Cancer (HCC)    Coronary artery disease    Diabetes mellitus without complication (HCC)    Fibromyalgia    GERD (gastroesophageal reflux disease)    Heart murmur    History of kidney stones    Hypertension    Hypothyroidism    Insomnia    Intractable migraine without aura    Melanoma (HCC) 10/10/2014   right ant. shoulder (exc)   Neuromuscular disorder (HCC)    feet   Pancreatitis    Pneumonia    PONV (postoperative nausea and vomiting)    Pre-diabetes    Prostate CA (HCC)    Sleep apnea    hx of no longer has per   Spondylosis of lumbar spine    Stroke (HCC) 2021   TIA x 2 some memory loss.   Thyroid disease    Past Surgical History:  Procedure Laterality Date   BACK SURGERY  2013   L4-L5 and a redo in 2014 after a fall   CHOLECYSTECTOMY  1992   FOOT SURGERY Left 1999   IVC FILTER INSERTION N/A 03/18/2024   Procedure: IVC FILTER INSERTION;  Surgeon: Sheree Penne Bruckner, MD;  Location: [facility] INVASIVE CV LAB;  Service: Cardiovascular;  Laterality: N/A;   MOHS SURGERY     PROSTATECTOMY  2008   REIMPLANTATION OF TOTAL KNEE Left 03/27/2024   Procedure: REVISION, TOTAL ARTHROPLASTY, KNEE;  Surgeon: Fidel Rogue, MD;  Location: [facility];  Service: Orthopedics;  Laterality:  Left;   ROTATOR CUFF REPAIR Left 2021   SPINAL CORD STIMULATOR IMPLANT  2020   TOTAL KNEE ARTHROPLASTY Right 2005   TOTAL KNEE ARTHROPLASTY Left 01/19/2022   Procedure: TOTAL KNEE ARTHROPLASTY;  Surgeon: Gerome Charleston, MD;  Location: [facility];  Service: Orthopedics;  Laterality: Left;  adductor canal 120   Patient Active Problem List   Diagnosis Date Noted   S/P revision of total knee, 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   Sepsis due to undetermined organism (HCC) 04/23/2023   Chronic migraine without aura, intractable, with status migrainosus 03/09/2023   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HCC) 01/25/2022   S/P total knee arthroplasty, 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   Hypokalemia 01/25/2022   Acute anemia 01/25/2022   Acute metabolic encephalopathy 01/25/2022   Osteoarthritis of left knee 01/19/2022   Atypical chest pain 08/15/2018   Hyperlipidemia 08/15/2018   Family history of heart disease 08/15/2018   Hypertension 05/31/2018   Hypothyroidism 05/31/2018   Acute pancreatitis 05/31/2018   Mild renal insufficiency 05/31/2018   REFERRING PROVIDER: Leigh Serge PA-C  REFERRING DIAG: Presence of left artificial knee joint  THERAPY DIAG:  Acute pain of  left knee  Stiffness of left knee, not elsewhere classified  Localized edema  Rationale for Evaluation and Treatment: Rehabilitation  ONSET DATE: 03/27/24  SUBJECTIVE:   SUBJECTIVE STATEMENT: Patient reports that his knee still hurts everyday, but okay. MD said knee looked good and continue with PT  PERTINENT HISTORY: Allergies (latex and adhesive), hypertension, history of DVTs, osteoarthritis, history of a CVA, history of cancer, asthma, diabetes, and fibromyalgia PAIN:  Are you having pain? Yes: NPRS scale: Current: 6/10  Best: 3-4/10 Worst: 6/10 Pain location: left knee Pain description: throbbing and aching  Aggravating factors: moving  Relieving factors: medication and ice  PRECAUTIONS: Fall  RED FLAGS: None   WEIGHT BEARING RESTRICTIONS: No  FALLS:  Has  patient fallen in last 6 months? Yes. Number of falls 4; most recent fall was weeks before surgery  LIVING ENVIRONMENT: Lives with: lives with their family Lives in: House/apartment Stairs: Yes: Internal: 13 steps; on left going up; step to pattern Has following equipment at home: Vannie - 2 wheeled  OCCUPATION: retired  PLOF: Independent  PATIENT GOALS: be able to walk and be outside  NEXT MD VISIT: 04/09/24  OBJECTIVE:  Note: Objective measures were completed at Evaluation unless otherwise noted.  DIAGNOSTIC FINDINGS: 03/27/24 left knee x-ray IMPRESSION: Revision left knee arthroplasty without immediate postoperative complication. PATIENT SURVEYS:  LEFS  Extreme difficulty/unable (0), Quite a bit of difficulty (1), Moderate difficulty (2), Little difficulty (3), No difficulty (4) Survey date:  04/04/24  Any of your usual work, housework or school activities 0  2. Usual hobbies, recreational or sporting activities 0  3. Getting into/out of the bath 2  4. Walking between rooms 3  5. Putting on socks/shoes 0  6. Squatting  0  7. Lifting an object, like a bag of groceries from the floor 0  8. Performing light activities around your home 0  9. Performing heavy activities around your home 0  10. Getting into/out of a car 2  11. Walking 2 blocks 0  12. Walking 1 mile 0  13. Going up/down 10 stairs (1 flight) 0  14. Standing for 1 hour 0  15.  sitting for 1 hour 4  16. Running on even ground 0  17. Running on uneven ground 0  18. Making sharp turns while running fast 0  19. Hopping  0  20. Rolling over in bed 2  Score total:  13/80     COGNITION: Overall cognitive status: Within functional limits for tasks assessed     SENSATION: Patient reports no numbness or tingling  EDEMA:  Circumferential: Left tibiofemoral joint line: 46.0 cm Right tibiofemoral joint line: 40.4 cm   PALPATION: TTP: left hip adductors  LOWER EXTREMITY ROM:  Active ROM Right eval Left eval  Left 04/08/24 04/22/24  Hip flexion      Hip extension      Hip abduction      Hip adduction      Hip internal rotation      Hip external rotation      Knee flexion 129 85 PROM: 89  92 PROM: 94 115 degrees(active)  Knee extension 0 17; limited by pain  10   Ankle dorsiflexion      Ankle plantarflexion      Ankle inversion      Ankle eversion       (Blank rows = not tested)  LOWER EXTREMITY MMT: not tested due to surgical condition  GAIT: Assistive device utilized: Environmental consultant - 2 wheeled Level  of assistance: Modified independence Comments: Decreased gait speed with the left knee flexed in stance phase and decreased stride length                                                                                                                                TREATMENT DATE:    04/22/24:                                     EXERCISE LOG  Exercise Repetitions and Resistance Comments  Nustep Level 3, 4 and 5 x 17 minutes    Rockerboard  3 minutes                PROM x 5 minutes f/b LE elevation and vasopneumatic on low x 15 minutes                                   04/15/24 EXERCISE LOG    LT knee  Exercise Repetitions and Resistance Comments  Nustep  L4 x 16 minutes @ seat 7-6   Rocker board  5 minutes   Lunges onto step     Standing marching 20 reps   LAQ 2# 3 x 10 with pause at top LLE only   HS curl Green tband 3x10   Seated heel slide     Seated hip ADD isometric  X10 hold 10 secs    Blank cell = exercise not performed today                                    04/11/24 EXERCISE LOG  Exercise Repetitions and Resistance Comments  Nustep  L4 x 15 minutes @ seat 6   Rocker board  5 minutes   Lunges onto a step  6 step x 3 minutes   Step up  6 step x 15 reps  Leading with LLE        Blank cell = exercise not performed today  Modalities: no redness or adverse reaction to today's modalities  Date:  Vaso: Knee, 34 degrees; low pressure, 15 mins, Pain and Edema                                    04/08/24 EXERCISE LOG  Exercise Repetitions and Resistance Comments  Nustep  L4 x 16 minutes @ seat 9-8   Goal assessment    SLR  10 reps    Lunges onto step  14 step x 2 minutes    Rocker board  5 minutes    Blank cell = exercise not performed today  Modalities: no redness or adverse reaction to today's modalities  Date:  Vaso: Knee, 34 degrees; low pressure, 15 mins, Pain and Edema  PATIENT EDUCATION:  Education details:  Person educated: Patient and Spouse Education method: Explanation Education comprehension: verbalized understanding  HOME EXERCISE PROGRAM: Reviewed postoperative HEP  ASSESSMENT:  CLINICAL IMPRESSION: Patient doing very well and achieved active flexion to 115 degrees after stretching .     OBJECTIVE IMPAIRMENTS: Abnormal gait, decreased activity tolerance, decreased balance, decreased mobility, difficulty walking, decreased ROM, decreased strength, hypomobility, increased edema, and pain.   ACTIVITY LIMITATIONS: standing, squatting, stairs, transfers, and locomotion level  PARTICIPATION LIMITATIONS: driving, shopping, community activity, and yard work  PERSONAL FACTORS: Past/current experiences, Transportation, and 3+ comorbidities: Allergies (latex and adhesive), hypertension, history of DVTs, osteoarthritis, history of a CVA, history of cancer, asthma, diabetes, and fibromyalgia are also affecting patient's functional outcome.   REHAB POTENTIAL: Good  CLINICAL DECISION MAKING: Evolving/moderate complexity  EVALUATION COMPLEXITY: Moderate   GOALS: Goals reviewed with patient? Yes  LONG TERM GOALS: Target date: 05/02/24  Patient will be independent with his HEP. Baseline:  Goal status: ON GOING  2.  Patient will be able to demonstrate at least 120 degrees of active left knee flexion for improved function navigating stairs. Baseline:  Goal status: ON GOING  3.  Patient will improve his active left knee extension to  within 5 degrees of neutral for improved gait mechanics. Baseline:  Goal status: ON GOING  4.  Patient will be able to navigate at least 4 steps with a reciprocal pattern for improved household mobility. Baseline: step to pattern  Goal status: ON GOING  5.  Patient will be able to ambulate at least 80 feet with minimal to no significant gait deviations for improved household mobility. Baseline:  Goal status: ON GOING  6.  Patient will improve his LEFS score to 45/80 or greater for improved perceived function with his daily activities.  Baseline:  Goal status: INITIAL   PLAN:  PT FREQUENCY: 2x/week  PT DURATION: 4 weeks  PLANNED INTERVENTIONS: 97164- PT Re-evaluation, 97750- Physical Performance Testing, 97110-Therapeutic exercises, 97530- Therapeutic activity, [id number]- Neuromuscular re-education, 97535- Self Care, [id number]- Manual therapy, [phone]- Gait training, [phone]- Electrical stimulation (unattended), 97016- Vasopneumatic device, Patient/Family education, Balance training, Stair training, Joint mobilization, Cryotherapy, and Moist heat  PLAN FOR NEXT SESSION: Nustep, quad sets, heel slides, lower extremity strengthening, manual therapy, and modalities as needed   Janique Hoefer, [place], PT 04/22/2024, 3:59 PM

## 2024-04-24 ENCOUNTER — Ambulatory Visit: Payer: PRIVATE HEALTH INSURANCE | Admitting: Physical Therapy

## 2024-04-24 DIAGNOSIS — M25562 Pain in left knee: Secondary | ICD-10-CM | POA: Diagnosis not present

## 2024-04-24 DIAGNOSIS — M25662 Stiffness of left knee, not elsewhere classified: Secondary | ICD-10-CM

## 2024-04-24 DIAGNOSIS — R600 Localized edema: Secondary | ICD-10-CM

## 2024-04-24 NOTE — Therapy (Signed)
 OUTPATIENT PHYSICAL THERAPY LOWER EXTREMITY TREATMENT   Patient Name: Joel Kim MRN: [id number] DOB:03-25-1946, 78 y.o., male Today's Date: 04/24/2024  END OF SESSION:  PT End of Session - 04/24/24 1520     Visit Number 8    Number of Visits 8    Date for PT Re-Evaluation 05/03/24    PT Start Time 0318    PT Stop Time 0404    PT Time Calculation (min) 46 min    Activity Tolerance Patient tolerated treatment well    Behavior During Therapy [facility] for tasks assessed/performed             Past Medical History:  Diagnosis Date   Anemia    Arthritis    Asthma    Atypical nevus 01/26/2006   right side of body (slight)   Basal cell carcinoma 11/17/2015   left ear rim tx cx3 105fu   Cancer (HCC)    Coronary artery disease    Diabetes mellitus without complication (HCC)    Fibromyalgia    GERD (gastroesophageal reflux disease)    Heart murmur    History of kidney stones    Hypertension    Hypothyroidism    Insomnia    Intractable migraine without aura    Melanoma (HCC) 10/10/2014   right ant. shoulder (exc)   Neuromuscular disorder (HCC)    feet   Pancreatitis    Pneumonia    PONV (postoperative nausea and vomiting)    Pre-diabetes    Prostate CA (HCC)    Sleep apnea    hx of no longer has per   Spondylosis of lumbar spine    Stroke (HCC) 2021   TIA x 2 some memory loss.   Thyroid disease    Past Surgical History:  Procedure Laterality Date   BACK SURGERY  2013   L4-L5 and a redo in 2014 after a fall   CHOLECYSTECTOMY  1992   FOOT SURGERY Left 1999   IVC FILTER INSERTION N/A 03/18/2024   Procedure: IVC FILTER INSERTION;  Surgeon: Sheree Penne Bruckner, MD;  Location: [facility] INVASIVE CV LAB;  Service: Cardiovascular;  Laterality: N/A;   MOHS SURGERY     PROSTATECTOMY  2008   REIMPLANTATION OF TOTAL KNEE Left 03/27/2024   Procedure: REVISION, TOTAL ARTHROPLASTY, KNEE;  Surgeon: Fidel Rogue, MD;  Location: [facility];  Service: Orthopedics;  Laterality:  Left;   ROTATOR CUFF REPAIR Left 2021   SPINAL CORD STIMULATOR IMPLANT  2020   TOTAL KNEE ARTHROPLASTY Right 2005   TOTAL KNEE ARTHROPLASTY Left 01/19/2022   Procedure: TOTAL KNEE ARTHROPLASTY;  Surgeon: Gerome Charleston, MD;  Location: [facility];  Service: Orthopedics;  Laterality: Left;  adductor canal 120   Patient Active Problem List   Diagnosis Date Noted   S/P revision of total knee, 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   Sepsis due to undetermined organism (HCC) 04/23/2023   Chronic migraine without aura, intractable, with status migrainosus 03/09/2023   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HCC) 01/25/2022   S/P total knee arthroplasty, 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   Hypokalemia 01/25/2022   Acute anemia 01/25/2022   Acute metabolic encephalopathy 01/25/2022   Osteoarthritis of left knee 01/19/2022   Atypical chest pain 08/15/2018   Hyperlipidemia 08/15/2018   Family history of heart disease 08/15/2018   Hypertension 05/31/2018   Hypothyroidism 05/31/2018   Acute pancreatitis 05/31/2018   Mild renal insufficiency 05/31/2018   REFERRING PROVIDER: Leigh Serge PA-C  REFERRING DIAG: Presence of left artificial knee joint  THERAPY DIAG:  Acute pain of  left knee  Stiffness of left knee, not elsewhere classified  Localized edema  Rationale for Evaluation and Treatment: Rehabilitation  ONSET DATE: 03/27/24  SUBJECTIVE:   SUBJECTIVE STATEMENT: Patient reports that his knee still hurts everyday, but okay. MD said knee looked good and continue with PT  PERTINENT HISTORY: Allergies (latex and adhesive), hypertension, history of DVTs, osteoarthritis, history of a CVA, history of cancer, asthma, diabetes, and fibromyalgia PAIN:  Are you having pain? Yes: NPRS scale: Current: 6/10  Best: 3-4/10 Worst: 6/10 Pain location: left knee Pain description: throbbing and aching  Aggravating factors: moving  Relieving factors: medication and ice  PRECAUTIONS: Fall  RED FLAGS: None   WEIGHT BEARING RESTRICTIONS: No  FALLS:  Has  patient fallen in last 6 months? Yes. Number of falls 4; most recent fall was weeks before surgery  LIVING ENVIRONMENT: Lives with: lives with their family Lives in: House/apartment Stairs: Yes: Internal: 13 steps; on left going up; step to pattern Has following equipment at home: Vannie - 2 wheeled  OCCUPATION: retired  PLOF: Independent  PATIENT GOALS: be able to walk and be outside  NEXT MD VISIT: 04/09/24  OBJECTIVE:  Note: Objective measures were completed at Evaluation unless otherwise noted.  DIAGNOSTIC FINDINGS: 03/27/24 left knee x-ray IMPRESSION: Revision left knee arthroplasty without immediate postoperative complication. PATIENT SURVEYS:  LEFS  Extreme difficulty/unable (0), Quite a bit of difficulty (1), Moderate difficulty (2), Little difficulty (3), No difficulty (4) Survey date:  04/04/24  Any of your usual work, housework or school activities 0  2. Usual hobbies, recreational or sporting activities 0  3. Getting into/out of the bath 2  4. Walking between rooms 3  5. Putting on socks/shoes 0  6. Squatting  0  7. Lifting an object, like a bag of groceries from the floor 0  8. Performing light activities around your home 0  9. Performing heavy activities around your home 0  10. Getting into/out of a car 2  11. Walking 2 blocks 0  12. Walking 1 mile 0  13. Going up/down 10 stairs (1 flight) 0  14. Standing for 1 hour 0  15.  sitting for 1 hour 4  16. Running on even ground 0  17. Running on uneven ground 0  18. Making sharp turns while running fast 0  19. Hopping  0  20. Rolling over in bed 2  Score total:  13/80     COGNITION: Overall cognitive status: Within functional limits for tasks assessed     SENSATION: Patient reports no numbness or tingling  EDEMA:  Circumferential: Left tibiofemoral joint line: 46.0 cm Right tibiofemoral joint line: 40.4 cm   PALPATION: TTP: left hip adductors  LOWER EXTREMITY ROM:  Active ROM Right eval Left eval  Left 04/08/24 04/22/24 04/24/24  Hip flexion       Hip extension       Hip abduction       Hip adduction       Hip internal rotation       Hip external rotation       Knee flexion 129 85 PROM: 89  92 PROM: 94 115 degrees(active) 120 (passive)  Knee extension 0 17; limited by pain  10  -5   Ankle dorsiflexion       Ankle plantarflexion       Ankle inversion       Ankle eversion        (Blank rows = not tested)  LOWER EXTREMITY MMT: not  tested due to surgical condition  GAIT: Assistive device utilized: Environmental consultant - 2 wheeled Level of assistance: Modified independence Comments: Decreased gait speed with the left knee flexed in stance phase and decreased stride length                                                                                                                                TREATMENT DATE:    04/24/24:                                     EXERCISE LOG  Exercise Repetitions and Resistance Comments  Nustep Level 4 x 15 minutes   Rockerboard  5 minutes                PROM x 4 minutes to patient's tolerance f/b LE elevation and vasopneumatic on low x 15 minutes  04/22/24:                                     EXERCISE LOG  Exercise Repetitions and Resistance Comments  Nustep Level 3, 4 and 5 x 17 minutes    Rockerboard  3 minutes                PROM x 5 minutes f/b LE elevation and vasopneumatic on low x 15 minutes                                   04/15/24 EXERCISE LOG    LT knee  Exercise Repetitions and Resistance Comments  Nustep  L4 x 16 minutes @ seat 7-6   Rocker board  5 minutes   Lunges onto step     Standing marching 20 reps   LAQ 2# 3 x 10 with pause at top LLE only   HS curl Green tband 3x10   Seated heel slide     Seated hip ADD isometric  X10 hold 10 secs    Blank cell = exercise not performed today                                    04/11/24 EXERCISE LOG  Exercise Repetitions and Resistance Comments  Nustep  L4 x 15 minutes @ seat 6    Rocker board  5 minutes   Lunges onto a step  6 step x 3 minutes   Step up  6 step x 15 reps  Leading with LLE        Blank cell = exercise not performed today  Modalities: no redness or adverse reaction to today's modalities  Date:  Vaso: Knee, 34 degrees; low pressure, 15 mins, Pain and  Edema                                   04/08/24 EXERCISE LOG  Exercise Repetitions and Resistance Comments  Nustep  L4 x 16 minutes @ seat 9-8   Goal assessment    SLR  10 reps    Lunges onto step  14 step x 2 minutes    Rocker board  5 minutes    Blank cell = exercise not performed today  Modalities: no redness or adverse reaction to today's modalities  Date:  Vaso: Knee, 34 degrees; low pressure, 15 mins, Pain and Edema  PATIENT EDUCATION:  Education details:  Person educated: Patient and Spouse Education method: Explanation Education comprehension: verbalized understanding  HOME EXERCISE PROGRAM: Reviewed postoperative HEP  ASSESSMENT:  CLINICAL IMPRESSION: Patient doing very well and achieved passive left knee flexion to 120 degrees after stretching.  He has made excellent progress toward goals as below.      OBJECTIVE IMPAIRMENTS: Abnormal gait, decreased activity tolerance, decreased balance, decreased mobility, difficulty walking, decreased ROM, decreased strength, hypomobility, increased edema, and pain.   ACTIVITY LIMITATIONS: standing, squatting, stairs, transfers, and locomotion level  PARTICIPATION LIMITATIONS: driving, shopping, community activity, and yard work  PERSONAL FACTORS: Past/current experiences, Transportation, and 3+ comorbidities: Allergies (latex and adhesive), hypertension, history of DVTs, osteoarthritis, history of a CVA, history of cancer, asthma, diabetes, and fibromyalgia are also affecting patient's functional outcome.   REHAB POTENTIAL: Good  CLINICAL DECISION MAKING: Evolving/moderate complexity  EVALUATION COMPLEXITY:  Moderate   GOALS: Goals reviewed with patient? Yes  LONG TERM GOALS: Target date: 05/02/24  Patient will be independent with his HEP. Baseline:  Goal status: ON GOING  2.  Patient will be able to demonstrate at least 120 degrees of active left knee flexion for improved function navigating stairs. Baseline:  Goal status: Partially met (120 degrees of passive left knee flexion).    3.  Patient will improve his active left knee extension to within 5 degrees of neutral for improved gait mechanics. Baseline:  Goal status: MET  4.  Patient will be able to navigate at least 4 steps with a reciprocal pattern for improved household mobility. Baseline: step to pattern  Goal status: MET  5.  Patient will be able to ambulate at least 80 feet with minimal to no significant gait deviations for improved household mobility. Baseline:  Goal status: MET.  Though a decrease in step and stride length.    6.  Patient will improve his LEFS score to 45/80 or greater for improved perceived function with his daily activities.  Baseline:  Goal status: INITIAL   PLAN:  PT FREQUENCY: 2x/week  PT DURATION: 4 weeks  PLANNED INTERVENTIONS: 97164- PT Re-evaluation, 97750- Physical Performance Testing, 97110-Therapeutic exercises, 97530- Therapeutic activity, [id number]- Neuromuscular re-education, 97535- Self Care, 02859- Manual therapy, [phone]- Gait training, [phone]- Electrical stimulation (unattended), 97016- Vasopneumatic device, Patient/Family education, Balance training, Stair training, Joint mobilization, Cryotherapy, and Moist heat  PLAN FOR NEXT SESSION: Nustep, quad sets, heel slides, lower extremity strengthening, manual therapy, and modalities as needed   Brittiney Dicostanzo, [place], PT 04/24/2024, 5:07 PM

## 2024-05-31 NOTE — Progress Notes (Signed)
 Assessment and Plan   1. Type 2 diabetes mellitus with diabetic polyneuropathy, without long-term current use of insulin  (*) (Primary) 2. Dyslipidemia associated with type 2 diabetes mellitus (*) -     Lipid panel; Future -     evolocumab  (REPATHA  SURECLICK) 140 mg/mL SOAJ pen injection; Inject 1 pen (140 mg dose) under the skin every 14  days., Starting Fri 05/31/2024, Normal 3. Essential hypertension -     lisinopril (PRINIVIL,ZESTRIL) 20 mg tablet; Take one tablet (20 mg dose) by mouth daily., Starting Fri 05/31/2024, Normal 4. Acquired hypothyroidism 5. Postoperative anemia due to acute blood loss -     CBC And Differential; Future     Doing well following total knee will get lipids checked today  sent in refills of repatha  to [facility] for his cholesterol Check lipids today  Send in Lisinopril 20 mg instead of 10 to keep his blood pressure under control  Continue synthroid  100mcg daily for thyroid Follow up in 3 months for AWV  Risks, benefits, and alternatives of the medications and treatment plan prescribed today were discussed, and patient expressed understanding. Plan follow-up as discussed or as needed if any worsening symptoms or change in condition.      Subjective   Joel Kim is a 78 y.o. (DOB 08-24-1946) male who presents for the following:    Patient presents with  . Diabetes  . Hypertension  . Hyperlipidemia  . Hypothyroidism    Diabetes He presents for his follow-up diabetic visit. He has type 2 diabetes mellitus. His disease course has been stable. Pertinent negatives for hypoglycemia include no headaches, speech difficulty or sweats. Associated symptoms include fatigue. Pertinent negatives for diabetes include no blurred vision, no chest pain, no foot paresthesias, no foot ulcerations, no polydipsia, no polyphagia, no polyuria, no visual change, no weakness and no weight loss. Risk factors for coronary artery disease include diabetes  mellitus, dyslipidemia, hypertension and male sex. Current diabetic treatments: mounjaro. He is compliant with treatment all of the time. An ACE inhibitor/angiotensin II receptor blocker is being taken. He does not see a podiatrist.Eye exam is current.  Hypertension This is a chronic problem. The current episode started more than 1 year ago. The problem is unchanged. The problem is controlled. Pertinent negatives include no anxiety, blurred vision, chest pain, headaches, malaise/fatigue, neck pain, orthopnea, palpitations, peripheral edema, PND, shortness of breath or sweats. There are no associated agents to hypertension. Risk factors for coronary artery disease include diabetes mellitus, dyslipidemia, male gender and family history. Past treatments include calcium channel blockers and angiotensin blockers (lisinopril and amlodipine ). The current treatment provides moderate improvement. There are no compliance problems.   Hyperlipidemia This is a chronic problem. The current episode started more than 1 year ago. The problem is controlled. Recent lipid tests were reviewed and are normal. Exacerbating diseases include diabetes and hypothyroidism. There are no known factors aggravating his hyperlipidemia. Pertinent negatives include no chest pain, focal sensory loss, focal weakness, leg pain, myalgias or shortness of breath.  Hypothyroidism Symptoms include fatigue. Patient reports no palpitations, visual change or weight loss. His past medical history is significant for diabetes and hyperlipidemia.     Reviewed and updated this visit by provider: Tobacco  Allergies  Meds  Problems  Med Hx  Surg Hx  Fam Hx      Review of Systems  Constitutional:  Positive for fatigue. Negative for malaise/fatigue and weight loss.  Eyes:  Negative for blurred vision.  Respiratory:  Negative  for shortness of breath and wheezing.   Cardiovascular:  Negative for chest pain, palpitations, orthopnea, leg swelling and  PND.  Endocrine: Negative for polydipsia, polyphagia and polyuria.  Genitourinary:  Negative for difficulty urinating and urgency.  Musculoskeletal:  Negative for gait problem, myalgias and neck pain.  Neurological:  Negative for focal weakness, speech difficulty, weakness, light-headedness and headaches.  Hematological:  Does not bruise/bleed easily.          Objective   Vitals:   05/31/24 0933 05/31/24 0957  BP: 150/82 139/80  Patient Position: Sitting   Pulse: 65   Temp: 98.7 F (37.1 C)   TempSrc: Oral   Resp: 16   Height: 5' 4 (1.626 m)   Weight: 169 lb (76.7 kg)   SpO2: 97%   BMI (Calculated): 29   PainSc:   7   PainLoc: Comment: all over     Physical Exam Vitals and nursing note reviewed.  Constitutional:      General: He is not in acute distress.    Appearance: Normal appearance. He is well-developed. He is not ill-appearing, toxic-appearing or diaphoretic.  HENT:     Head: Normocephalic and atraumatic.     Right Ear: External ear normal.     Left Ear: External ear normal.     Nose: Nose normal.  Eyes:     Pupils: Pupils are equal, round, and reactive to light.  Neck:     Thyroid: No thyromegaly.     Vascular: No carotid bruit.     Trachea: No tracheal deviation.  Cardiovascular:     Rate and Rhythm: Normal rate and regular rhythm.     Heart sounds: Normal heart sounds. No murmur heard.    No friction rub. No gallop.  Musculoskeletal:     Cervical back: Normal range of motion and neck supple.  Pulmonary:     Effort: Pulmonary effort is normal. No respiratory distress.     Breath sounds: Normal breath sounds. No wheezing.  Abdominal:     General: Bowel sounds are normal. There is no distension.     Palpations: Abdomen is soft. There is no mass.     Tenderness: There is no abdominal tenderness. There is no guarding or rebound.     Hernia: No hernia is present.  Lymphadenopathy:     Cervical: No cervical adenopathy.  Skin:    General: Skin is warm and  dry.     Coloration: Skin is not pale.     Findings: No erythema or rash.  Neurological:     Mental Status: He is alert and oriented to person, place, and time.     Gait: Gait normal.     Deep Tendon Reflexes: Reflexes are normal and symmetric.  Psychiatric:        Mood and Affect: Mood normal.        Behavior: Behavior normal.        Thought Content: Thought content normal.        Judgment: Judgment normal.

## 2024-06-03 NOTE — Progress Notes (Signed)
 Cholesterol much better   triglycerides much better  Anemia has resolved  Blood counts including red cells, white cells and platelets are normal.

## 2024-06-20 NOTE — Progress Notes (Signed)
 Assessment and Plan   1. Closed fracture of multiple ribs of right side, initial encounter (Primary) -     XR Ribs Right  W Pa Chest 2. Right hip pain -     XR Pelvis And Right  Hip; Future    Xrays today no signs of hip fracture   spinal hardware and stimulator in place  Waiting on radiology for rib xray reading  Has pain meds from pain management    May need physical therapy if still moving slowly at the end of next week     Risks, benefits, and alternatives of the medications and treatment plan prescribed today were discussed, and patient expressed understanding. Plan follow-up as discussed or as needed if any worsening symptoms or change in condition.      Subjective   Joel Kim is a 78 y.o. (DOB 11-Dec-1945) male who presents for the following:    Patient presents with  . Fall    Pt presents today for fall, per [place] to be seen. Knot on ribs and hip and right shoulder.    Fall Point of impact: right side. The pain is present in the right hip (right rib pain). The pain is at a severity of 7/10. The pain is moderate. The symptoms are aggravated by ambulation (breathing). He has tried acetaminophen  for the symptoms. The treatment provided no relief.     Reviewed and updated this visit by provider: Tobacco  Allergies  Meds  Problems  Med Hx  Surg Hx  Fam Hx      Review of Systems  Respiratory:  Negative for shortness of breath and wheezing.   Cardiovascular:  Negative for chest pain, palpitations and leg swelling.  Genitourinary:  Negative for difficulty urinating and urgency.  Musculoskeletal:  Positive for arthralgias, back pain, gait problem and myalgias.  Neurological:  Negative for speech difficulty and light-headedness.  Hematological:  Does not bruise/bleed easily.          Objective   Vitals:   06/20/24 1323  BP: 122/78  Patient Position: Sitting  Pulse: 75  Temp: 98.8 F (37.1 C)  TempSrc: Oral  Resp: 16  Height: 5' 4 (1.626 m)   Weight: 167 lb 12.8 oz (76.1 kg)  SpO2: 98%  BMI (Calculated): 28.8    Physical Exam Vitals and nursing note reviewed.  Constitutional:      General: He is not in acute distress.    Appearance: Normal appearance. He is well-developed. He is not ill-appearing, toxic-appearing or diaphoretic.  HENT:     Head: Normocephalic and atraumatic.     Right Ear: External ear normal.     Left Ear: External ear normal.     Nose: Nose normal.  Eyes:     Pupils: Pupils are equal, round, and reactive to light.  Neck:     Thyroid: No thyromegaly.     Vascular: No carotid bruit.     Trachea: No tracheal deviation.  Cardiovascular:     Rate and Rhythm: Normal rate and regular rhythm.     Heart sounds: Normal heart sounds. No murmur heard.    No friction rub. No gallop.  Musculoskeletal:     Cervical back: Normal range of motion and neck supple.     Comments: Lateral rib pain with AP compression on the right   Right lateral hip pain  with some buttocks pain   no radicular pain  Using a cane       Pulmonary:  Effort: Pulmonary effort is normal. No respiratory distress.     Breath sounds: Normal breath sounds. No wheezing.  Abdominal:     General: Bowel sounds are normal. There is no distension.     Palpations: Abdomen is soft. There is no mass.     Tenderness: There is no abdominal tenderness. There is no guarding or rebound.     Hernia: No hernia is present.  Lymphadenopathy:     Cervical: No cervical adenopathy.  Skin:    General: Skin is warm and dry.     Coloration: Skin is not pale.     Findings: No erythema or rash.  Neurological:     Mental Status: He is alert and oriented to person, place, and time.     Gait: Gait normal.     Deep Tendon Reflexes: Reflexes are normal and symmetric.  Psychiatric:        Mood and Affect: Mood normal.        Behavior: Behavior normal.        Thought Content: Thought content normal.        Judgment: Judgment normal.

## 2024-06-21 NOTE — Progress Notes (Signed)
 Hip xrays only show mild arthritis

## 2024-06-21 NOTE — Progress Notes (Signed)
 Right rib fracture   Need repeat xray in 6 weeks to make sure lung has cleared and fracture is healting

## 2024-07-02 ENCOUNTER — Other Ambulatory Visit: Payer: Self-pay

## 2024-07-02 DIAGNOSIS — Z86718 Personal history of other venous thrombosis and embolism: Secondary | ICD-10-CM

## 2024-07-08 ENCOUNTER — Ambulatory Visit (HOSPITAL_COMMUNITY)
Admission: RE | Admit: 2024-07-08 | Discharge: 2024-07-08 | Disposition: A | Discharge status: Home / Self Care | Payer: PRIVATE HEALTH INSURANCE | Attending: Vascular Surgery | Admitting: Vascular Surgery

## 2024-07-08 ENCOUNTER — Encounter (HOSPITAL_COMMUNITY)
Admission: RE | Disposition: A | Discharge status: Home / Self Care | Payer: Self-pay | Source: Home / Self Care | Attending: Vascular Surgery

## 2024-07-08 ENCOUNTER — Other Ambulatory Visit: Payer: Self-pay

## 2024-07-08 DIAGNOSIS — Z86718 Personal history of other venous thrombosis and embolism: Secondary | ICD-10-CM | POA: Insufficient documentation

## 2024-07-08 DIAGNOSIS — Z96652 Presence of left artificial knee joint: Secondary | ICD-10-CM | POA: Diagnosis not present

## 2024-07-08 DIAGNOSIS — Z4589 Encounter for adjustment and management of other implanted devices: Secondary | ICD-10-CM | POA: Insufficient documentation

## 2024-07-08 DIAGNOSIS — Z452 Encounter for adjustment and management of vascular access device: Secondary | ICD-10-CM

## 2024-07-08 DIAGNOSIS — Z7982 Long term (current) use of aspirin: Secondary | ICD-10-CM | POA: Insufficient documentation

## 2024-07-08 HISTORY — PX: IVC FILTER REMOVAL: CATH118246

## 2024-07-08 HISTORY — PX: IVC VENOGRAPHY: CATH118301

## 2024-07-08 LAB — POCT I-STAT, CHEM 8
BUN: 13 mg/dL (ref 8–23)
Calcium, Ion: 1.25 mmol/L (ref 1.15–1.40)
Chloride: 99 mmol/L (ref 98–111)
Creatinine, Ser: 1.1 mg/dL (ref 0.61–1.24)
Glucose, Bld: 120 mg/dL — ABNORMAL HIGH (ref 70–99)
HCT: 44 % (ref 39.0–52.0)
Hemoglobin: 15 g/dL (ref 13.0–17.0)
Potassium: 3.2 mmol/L — ABNORMAL LOW (ref 3.5–5.1)
Sodium: 143 mmol/L (ref 135–145)
TCO2: 29 mmol/L (ref 22–32)

## 2024-07-08 SURGERY — IVC FILTER REMOVAL
Anesthesia: LOCAL

## 2024-07-08 MED ORDER — MIDAZOLAM HCL 2 MG/2ML IJ SOLN
INTRAMUSCULAR | Status: DC | PRN
  Administered 2024-07-08: 1 mg via INTRAVENOUS

## 2024-07-08 MED ORDER — HEPARIN (PORCINE) IN NACL 1000-0.9 UT/500ML-% IV SOLN
INTRAVENOUS | Status: DC | PRN
  Administered 2024-07-08: 500 mL

## 2024-07-08 MED ORDER — SODIUM CHLORIDE 0.9 % IV SOLN: INTRAVENOUS | Status: DC

## 2024-07-08 MED ORDER — FENTANYL CITRATE (PF) 100 MCG/2ML IJ SOLN
INTRAMUSCULAR | Status: AC
  Filled 2024-07-08: qty 2

## 2024-07-08 MED ORDER — LIDOCAINE HCL (PF) 1 % IJ SOLN
INTRAMUSCULAR | Status: AC
  Filled 2024-07-08: qty 30

## 2024-07-08 MED ORDER — IODIXANOL 320 MG/ML IV SOLN
INTRAVENOUS | Status: DC | PRN
  Administered 2024-07-08: 10 mL

## 2024-07-08 MED ORDER — MIDAZOLAM HCL 2 MG/2ML IJ SOLN
INTRAMUSCULAR | Status: AC
  Filled 2024-07-08: qty 2

## 2024-07-08 MED ORDER — LIDOCAINE HCL (PF) 1 % IJ SOLN
INTRAMUSCULAR | Status: DC | PRN
  Administered 2024-07-08: 18 mL

## 2024-07-08 MED ORDER — FENTANYL CITRATE (PF) 100 MCG/2ML IJ SOLN
INTRAMUSCULAR | Status: DC | PRN
  Administered 2024-07-08: 25 ug via INTRAVENOUS

## 2024-07-08 SURGICAL SUPPLY — 5 items
COVER DOME SNAP 22 D (MISCELLANEOUS) IMPLANT
KIT MICROPUNCTURE NIT STIFF (SHEATH) IMPLANT
PACK CARDIAC CATHETERIZATION (CUSTOM PROCEDURE TRAY) IMPLANT
SET CLOVERSNARE FLT RETRIEVAL (MISCELLANEOUS) IMPLANT
WIRE BENTSON .035X145CM (WIRE) IMPLANT

## 2024-07-08 NOTE — Op Note (Signed)
    Patient name: Joel Kim MRN: [id number] DOB: 08-Dec-1945 Sex: male  07/08/2024 Pre-operative Diagnosis: Presence of IVC filter Post-operative diagnosis:  Same Surgeon:  Penne BROCKS. Sheree, MD Procedure Performed: 1.  Percutaneous guided cannulation right internal jugular vein 2.  IVC filter retrieval 3.  Central venogram 4.  Moderate sedation with fentanyl  and Versed  for 10 minutes  Indications: 78 year old male with history of IVC filter placement for revision left total knee arthroplasty.  He had a previous DVT during his initial knee surgery.  He has now recovered from total knee arthroplasty and he is indicated for filter removal.  Patient is on aspirin  does not take anticoagulants.   Findings: The IVC filter was midline and was easily snared and removed and the IVC filter was intact at completion and the IVC was patent without any extravasation.   Procedure:  The patient was identified in the holding area and taken to room 8.  The patient was then placed supine on the table and prepped and draped in the usual sterile fashion.  A time out was called.  Ultrasound was used to evaluate the right internal jugular vein which was patent and compressible.  The area was anesthetized 1% lidocaine  and moderate sedation was administered with fentanyl  and Versed .  The IJ was cannulated with my personal follow-up wire and sheath and ultrasound images saved to the permanent record.  We placed the Bentson wire into the IVC under fluoroscopic guidance and then placed the retrieval sheath just above the filter.  The filter was snared and the sheath was advanced over the filter and the filter was removed intact.  Completion venogram demonstrated no extravasation or stenosis of the IVC.  The filter retrieval sheath was then removed and pressure held until hemostasis was obtained and Dermabond was placed at the skin level.  Patient tolerated the procedure without any complication.   Contrast: 10  cc   Lawarence Meek C. Sheree, MD [facility] Office: [phone] Pager: [phone]

## 2024-07-08 NOTE — H&P (Signed)
 H+P   History of Present Illness: This is a 78 y.o. 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  More recently underwent revision of his left knee total knee arthroplasty.  He was doing very well had healed until the recent fall now has swelling in the left lower extremity and pain in the right chest where he has a fractured rib.  He is otherwise fully recovered with no plans for further surgical intervention.  Patient does not take anticoagulation and is on aspirin  daily.  Past Medical History:  Diagnosis Date   Anemia    Arthritis    Asthma    Atypical nevus 01/26/2006   right side of body (slight)   Basal cell carcinoma 11/17/2015   left ear rim tx [id number]   Cancer (HCC)    Coronary artery disease    Diabetes mellitus without complication (HCC)    Fibromyalgia    GERD (gastroesophageal reflux disease)    Heart murmur    History of kidney stones    Hypertension    Hypothyroidism    Insomnia    Intractable migraine without aura    Melanoma (HCC) 10/10/2014   right ant. shoulder (exc)   Neuromuscular disorder (HCC)    feet   Pancreatitis    Pneumonia    PONV (postoperative nausea and vomiting)    Pre-diabetes    Prostate CA (HCC)    Sleep apnea    hx of no longer has per   Spondylosis of lumbar spine    Stroke (HCC) 2021   TIA x 2 some memory loss.   Thyroid disease     Past Surgical History:  Procedure Laterality Date   BACK SURGERY  2013   L4-L5 and a redo in 2014 after a fall   CHOLECYSTECTOMY  1992   FOOT SURGERY Left 1999   IVC FILTER INSERTION N/A 03/18/2024   Procedure: IVC FILTER INSERTION;  Surgeon: Sheree Penne Bruckner, MD;  Location: [facility]IVE CV [facility];  Service: Cardiovascular;  Laterality: N/A;   MOHS SURGERY     PROSTATECTOMY  2008   REIMPLANTATION OF TOTAL KNEE Left 03/27/2024   Procedure: REVISION, TOTAL ARTHROPLASTY, KNEE;  Surgeon: Fidel Rogue, MD;  Location: [facility];  Service: Orthopedics;  Laterality: Left;    ROTATOR CUFF REPAIR Left 2021   SPINAL CORD STIMULATOR IMPLANT  2020   TOTAL KNEE ARTHROPLASTY Right 2005   TOTAL KNEE ARTHROPLASTY Left 01/19/2022   Procedure: TOTAL KNEE ARTHROPLASTY;  Surgeon: Gerome Charleston, MD;  Location: [facility];  Service: Orthopedics;  Laterality: Left;  adductor canal 120    Allergies  Allergen Reactions   Reclast [Zoledronic Acid] Other (See Comments)    Ended up in hospital/ high fever and    Tapentadol Rash and Other (See Comments)    Breaks patient out   Nucynta    Paroxetine Hcl Other (See Comments)    Irritability    Statins Other (See Comments)    Muscle aches   Adhesive [Tape]     Breaks patient out    Ciprofloxacin Hives   Mannitol Other (See Comments)    Unknown   Flagyl [Metronidazole] Rash   Latex Rash    Irritated skin    Prior to Admission medications   Medication Sig Start Date End Date Taking? Authorizing Provider  albuterol  (PROVENTIL  HFA;VENTOLIN  HFA) 108 (90 Base) MCG/ACT inhaler Inhale 2 puffs into the lungs every 6 (six) hours as needed. 05/13/18  Yes Rancour, Garnette, MD  amLODipine  (NORVASC ) 5 MG tablet Take 5 mg by mouth daily. 02/26/24  Yes [provider]  Ascorbic Acid (VITAMIN C GUMMIE PO) Take 1 tablet by mouth in the morning.   Yes [provider]  aspirin  EC 81 MG tablet Take 81 mg by mouth at bedtime.   Yes [provider]  cetirizine (ZYRTEC) 10 MG tablet Take 10 mg by mouth at bedtime.   Yes [provider]  clotrimazole-betamethasone (LOTRISONE) cream Apply 1 Application topically 2 (two) times daily as needed (skin irritation.). 08/30/23  Yes [provider]  levothyroxine  (SYNTHROID ) 100 MCG tablet Take 100 mcg by mouth daily before breakfast. 12/06/18  Yes [provider]  lisinopril (ZESTRIL) 20 MG tablet Take 20 mg by mouth in the morning.   Yes [provider]  montelukast  (SINGULAIR ) 10 MG tablet Take 10 mg by mouth at bedtime.   Yes [provider]  Multiple Vitamin (MULTIVITAMIN WITH MINERALS) TABS tablet Take 1 tablet by mouth in the morning.   Yes [provider]  mupirocin ointment (BACTROBAN) 2 % Apply 1 Application topically 3 (three) times daily as needed (WOUND CARE). 02/07/22  Yes [provider]  ondansetron  (ZOFRAN ) 4 MG tablet Take 1 tablet (4 mg total) by mouth every 8 (eight) hours as needed for nausea or vomiting. 03/28/24 03/28/25 Yes Leigh Valery RAMAN, PA-C  oxyCODONE  ER 13.5 MG C12A Take 13.5 mg by mouth 2 (two) times daily.   Yes [provider]  Oxycodone  HCl 10 MG TABS Take 10 mg by mouth 4 (four) times daily as needed (pain).   Yes [provider]  pantoprazole  (PROTONIX ) 40 MG tablet Take 1 tablet (40 mg total) by mouth daily. 01/21/13  Yes Georgina Nancyann ORN, MD  polyethylene glycol (MIRALAX  / GLYCOLAX ) 17 g packet Take 17 g by mouth daily as needed for mild constipation. 01/30/22  Yes Samtani, Jai-Gurmukh, MD  potassium chloride  (KLOR-CON ) 10 MEQ tablet Take 10 mEq by mouth daily. 05/21/24  Yes [provider]  ranolazine  (RANEXA ) 500 MG 12 hr tablet Take 500 mg by mouth 2 (two) times daily.   Yes [provider]  REPATHA  SURECLICK 140 MG/ML SOAJ Inject 140 mg into the skin every 14 (fourteen) days.   Yes [provider]  tirzepatide CLOYDE) 2.5 MG/0.5ML Pen Inject 2.5 mg into the skin every Friday.   Yes [provider]  traZODone  (DESYREL ) 150 MG tablet Take 75 mg by mouth at bedtime.   Yes [provider]  Vitamin D-Vitamin K (D3 + K2 PO) Take 1 tablet by mouth in the morning.   Yes [provider]  apixaban  (ELIQUIS ) 2.5 MG TABS tablet Take 1 tablet (2.5 mg total) by mouth 2 (two) times daily. Patient not taking: Reported on 07/03/2024 03/28/24   Leigh Valery RAMAN, PA-C  oxyCODONE  (ROXICODONE ) 5 MG immediate release tablet Take 2 tablets (10 mg total) by mouth every 4 (four) hours as needed for severe pain (pain score  7-10). Patient not taking: Reported on 07/03/2024 03/28/24   Leigh Valery RAMAN, PA-C    Social History   Socioeconomic History   Marital status: Married    Spouse name: Not on file   Number of children: Not on file   Years of education: Not on file   Highest education level: Not on file  Occupational History   Not on file  Tobacco Use   Smoking status: Never   Smokeless tobacco: Never  Vaping Use   Vaping  status: Never Used  Substance and Sexual Activity   Alcohol  use: No   Drug use: No   Sexual activity: Not on file  Other Topics Concern   Not on file  Social History Narrative   Not on file   Social Drivers of Health   Financial Resource Strain: Low Risk  (11/23/2023)   Received from [facility]   Overall Financial Resource Strain (CARDIA)    Difficulty of Paying Living Expenses: Not hard at all  Food Insecurity: No Food Insecurity (03/27/2024)   Hunger Vital Sign    Worried About Running Out of Food in the Last Year: Never true    Ran Out of Food in the Last Year: Never true  Transportation Needs: No Transportation Needs (03/27/2024)   PRAPARE - Administrator, Civil Service (Medical): No    Lack of Transportation (Non-Medical): No  Physical Activity: Insufficiently Active (11/23/2023)   Received from [facility]   Exercise Vital Sign    On average, how many days per week do you engage in moderate to strenuous exercise (like a brisk walk)?: 5 days    On average, how many minutes do you engage in exercise at this level?: 20 min  Stress: No Stress Concern Present (11/23/2023)   Received from [facility] of Occupational Health - Occupational Stress Questionnaire    Feeling of Stress : Only a little  Social Connections: Moderately Integrated (03/27/2024)   Social Connection and Isolation Panel    Frequency of Communication with Friends and Family: Three times a week    Frequency of Social Gatherings with Friends and Family: Once a week     Attends Religious Services: More than 4 times per year    Active Member of [employer] or Organizations: No    Attends Banker Meetings: Never    Marital Status: Married  Catering manager Violence: Not At Risk (03/27/2024)   Humiliation, Afraid, Rape, and Kick questionnaire    Fear of Current or Ex-Partner: No    Emotionally Abused: No    Physically Abused: No    Sexually Abused: No     No family history on file.  ROS Left knee pain, right chest pain at site of rib fracture   Physical Examination Vitals:   07/08/24 0728  BP: (!) 134/97  Pulse: 85  Resp: 16  Temp: 98 F (36.7 C)  SpO2: 95%     Physical Exam Awake alert and oriented Nonlabored respirations There is well-healed incision of the left lower extremity with swelling about the left knee and lower leg  CBC    Component Value Date/Time   WBC 17.0 (H) 03/28/2024 0342   RBC 2.98 (L) 03/28/2024 0342   HGB 9.5 (L) 03/28/2024 0342   HCT 27.3 (L) 03/28/2024 0342   PLT 242 03/28/2024 0342   MCV 91.6 03/28/2024 0342   MCH 31.9 03/28/2024 0342   MCHC 34.8 03/28/2024 0342   RDW 13.1 03/28/2024 0342   LYMPHSABS 2.6 12/29/2023 1945   MONOABS 0.9 12/29/2023 1945   EOSABS 0.4 12/29/2023 1945   BASOSABS 0.1 12/29/2023 1945    BMET    Component Value Date/Time   NA 131 (L) 03/28/2024 0342   K 3.5 03/28/2024 0342   CL 96 (L) 03/28/2024 0342   CO2 24 03/28/2024 0342   GLUCOSE 182 (H) 03/28/2024 0342   BUN 14 03/28/2024 0342   CREATININE 1.16 03/28/2024 0342   CALCIUM 8.5 (L) 03/28/2024 0342  GFRNONAA >60 03/28/2024 0342   GFRAA >60 08/10/2018 1539    COAGS: Lab Results  Component Value Date   INR 1.1 04/23/2023   INR 0.98 11/06/2010    ASSESSMENT/PLAN: This is a 78 y.o. male history of DVT at the time of previous knee surgery has now undergone revision and has recovered well.  Plan for IVC filter removal today.  We discussed the risk benefits and alternatives as well as possible need to keep the  filter if we cannot remove safely.  They demonstrate good understanding and consent was signed.  Geni Skorupski C. Sheree, MD [facility] [place] [facility] Office: [phone] Pager: [phone]

## 2024-07-09 ENCOUNTER — Encounter (HOSPITAL_COMMUNITY): Payer: Self-pay | Admitting: Vascular Surgery

## 2024-08-21 ENCOUNTER — Ambulatory Visit: Payer: PRIVATE HEALTH INSURANCE | Attending: Orthopedic Surgery | Admitting: Physical Therapy

## 2024-08-21 ENCOUNTER — Other Ambulatory Visit: Payer: Self-pay

## 2024-08-21 ENCOUNTER — Other Ambulatory Visit (HOSPITAL_COMMUNITY): Payer: Self-pay

## 2024-08-21 DIAGNOSIS — M6281 Muscle weakness (generalized): Secondary | ICD-10-CM | POA: Diagnosis present

## 2024-08-21 DIAGNOSIS — Z9181 History of falling: Secondary | ICD-10-CM | POA: Insufficient documentation

## 2024-08-21 DIAGNOSIS — M25562 Pain in left knee: Secondary | ICD-10-CM | POA: Diagnosis present

## 2024-08-21 DIAGNOSIS — R2689 Other abnormalities of gait and mobility: Secondary | ICD-10-CM | POA: Insufficient documentation

## 2024-08-21 NOTE — Therapy (Signed)
 OUTPATIENT PHYSICAL THERAPY LOWER EXTREMITY EVALUATION   Patient Name: Joel Kim MRN: [id number] DOB:1946/06/27, 78 y.o., male Today's Date: 08/21/2024  END OF SESSION:  PT End of Session - 08/21/24 1345     Visit Number 1    Authorization Type [employer]    PT Start Time 1345    PT Stop Time 1425    PT Time Calculation (min) 40 min          Past Medical History:  Diagnosis Date   Anemia    Arthritis    Asthma    Atypical nevus 01/26/2006   right side of body (slight)   Basal cell carcinoma 11/17/2015   left ear rim tx cx3 46fu   Cancer (HCC)    Coronary artery disease    Diabetes mellitus without complication (HCC)    Fibromyalgia    GERD (gastroesophageal reflux disease)    Heart murmur    History of kidney stones    Hypertension    Hypothyroidism    Insomnia    Intractable migraine without aura    Melanoma (HCC) 10/10/2014   right ant. shoulder (exc)   Neuromuscular disorder (HCC)    feet   Pancreatitis    Pneumonia    PONV (postoperative nausea and vomiting)    Pre-diabetes    Prostate CA (HCC)    Sleep apnea    hx of no longer has per   Spondylosis of lumbar spine    Stroke (HCC) 2021   TIA x 2 some memory loss.   Thyroid disease    Past Surgical History:  Procedure Laterality Date   BACK SURGERY  2013   L4-L5 and a redo in 2014 after a fall   CHOLECYSTECTOMY  1992   FOOT SURGERY Left 1999   IVC FILTER INSERTION N/A 03/18/2024   Procedure: IVC FILTER INSERTION;  Surgeon: Sheree Penne Bruckner, MD;  Location: [facility] INVASIVE CV LAB;  Service: Cardiovascular;  Laterality: N/A;   IVC FILTER REMOVAL N/A 07/08/2024   Procedure: IVC FILTER REMOVAL;  Surgeon: Sheree Penne Bruckner, MD;  Location: [facility] INVASIVE CV LAB;  Service: Cardiovascular;  Laterality: N/A;   IVC VENOGRAPHY N/A 07/08/2024   Procedure: IVC Venography;  Surgeon: Sheree Penne Bruckner, MD;  Location: [facility] INVASIVE CV LAB;  Service: Cardiovascular;  Laterality: N/A;    MOHS SURGERY     PROSTATECTOMY  2008   REIMPLANTATION OF TOTAL KNEE Left 03/27/2024   Procedure: REVISION, TOTAL ARTHROPLASTY, KNEE;  Surgeon: Fidel Rogue, MD;  Location: [facility];  Service: Orthopedics;  Laterality: Left;   ROTATOR CUFF REPAIR Left 2021   SPINAL CORD STIMULATOR IMPLANT  2020   TOTAL KNEE ARTHROPLASTY Right 2005   TOTAL KNEE ARTHROPLASTY Left 01/19/2022   Procedure: TOTAL KNEE ARTHROPLASTY;  Surgeon: Gerome Charleston, MD;  Location: [facility];  Service: Orthopedics;  Laterality: Left;  adductor canal 120   Patient Active Problem List   Diagnosis Date Noted   S/P revision of total knee, 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   Sepsis due to undetermined organism (HCC) 04/23/2023   Chronic migraine without aura, intractable, with status migrainosus 03/09/2023   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HCC) 01/25/2022   S/P total knee arthroplasty, 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   Hypokalemia 01/25/2022   Acute anemia 01/25/2022   Acute metabolic encephalopathy 01/25/2022   Osteoarthritis of left knee 01/19/2022   Atypical chest pain 08/15/2018   Hyperlipidemia 08/15/2018   Family history of heart disease 08/15/2018   Hypertension 05/31/2018   Hypothyroidism 05/31/2018   Acute pancreatitis  05/31/2018   Mild renal insufficiency 05/31/2018    PCP:   REFERRING PROVIDER: Fidel Rogue, MD  REFERRING DIAG: M70.52 (ICD-10-CM) - Other bursitis of knee, left knee  THERAPY DIAG:  Acute pain of left knee  History of falling  Muscle weakness (generalized)  Other abnormalities of gait and mobility  Rationale for Evaluation and Treatment: Rehabilitation  ONSET DATE: 3 weeks ago  SUBJECTIVE:   SUBJECTIVE STATEMENT: Pt states he fell and hurt his L knee again. Pt states he was walking and fell. Whole left leg hurts but mostly the front of his knee. Pt states he broke his R rib.   PERTINENT HISTORY: Back pain,   PAIN:  Are you having pain? Yes: NPRS scale: 3 or 4 currently; at worst 8 or 9 Pain location: Front of L knee  into medial knee Pain description: aching, throbbing Aggravating factors: Walking, squat Relieving factors: pain medicine, pain patches  PRECAUTIONS: Fall  RED FLAGS: None   WEIGHT BEARING RESTRICTIONS: No  FALLS:  Has patient fallen in last 6 months? Yes. Number of falls 2  LIVING ENVIRONMENT: Lives with: lives with their spouse Lives in: House/apartment Stairs: No; 3 steps at most in the front Has following equipment at home: None  OCCUPATION: Takes care of farm animals  PLOF: Independent  PATIENT GOALS: Decrease pain, less difficulty with work activities, improve movement, return to rec activities, improve strength, less difficulty with home activities  NEXT MD VISIT: none  OBJECTIVE:  Note: Objective measures were completed at Evaluation unless otherwise noted.  DIAGNOSTIC FINDINGS: last x-ray 03/27/24 on Epic demonstrating L knee arthroplasty revision  PATIENT SURVEYS:  LEFS  Extreme difficulty/unable (0), Quite a bit of difficulty (1), Moderate difficulty (2), Little difficulty (3), No difficulty (4) Survey date:  08/21/24  Any of your usual work, housework or school activities 1  2. Usual hobbies, recreational or sporting activities 1  3. Getting into/out of the bath 3  4. Walking between rooms 4  5. Putting on socks/shoes 2  6. Squatting  1  7. Lifting an object, like a bag of groceries from the floor 2  8. Performing light activities around your home 2  9. Performing heavy activities around your home 1  10. Getting into/out of a car 3  11. Walking 2 blocks 1  12. Walking 1 mile 1  13. Going up/down 10 stairs (1 flight) 1  14. Standing for 1 hour 0  15.  sitting for 1 hour 4  16. Running on even ground 0  17. Running on uneven ground 0  18. Making sharp turns while running fast 0  19. Hopping  1  20. Rolling over in bed 2  Score total:  30     COGNITION: Overall cognitive status: Within functional limits for tasks  assessed     SENSATION: WFL  EDEMA:  Mild  MUSCLE LENGTH: Did not assess  POSTURE: right pelvic obliquity  PALPATION: TTP medial knee  LOWER EXTREMITY ROM:  Active ROM Right eval Left eval  Hip flexion    Hip extension    Hip abduction    Hip adduction    Hip internal rotation    Hip external rotation    Knee flexion    Knee extension    Ankle dorsiflexion    Ankle plantarflexion    Ankle inversion    Ankle eversion     (Blank rows = not tested)  LOWER EXTREMITY MMT:  MMT Right eval Left eval  Hip flexion  3+ 3+  Hip extension Bridge 58 sec  Hip abduction 3+ 3+  Hip adduction 3+ 3+  Hip internal rotation    Hip external rotation    Knee flexion 5 5  Knee extension 4- 4-  Ankle dorsiflexion    Ankle plantarflexion    Ankle inversion    Ankle eversion     (Blank rows = not tested)  LOWER EXTREMITY SPECIAL TESTS:  Did not assess  FUNCTIONAL TESTS:  5 times sit to stand: <10 sec Berg Balance Test: 53 FGA: 21  [facility] PT Assessment - 08/21/24 0001       Standardized Balance Assessment   Standardized Balance Assessment Berg Balance Test;Dynamic Gait Index      Berg Balance Test   Sit to Stand Able to stand without using hands and stabilize independently    Standing Unsupported Able to stand safely 2 minutes    Sitting with Back Unsupported but Feet Supported on Floor or Stool Able to sit safely and securely 2 minutes    Stand to Sit Sits safely with minimal use of hands    Transfers Able to transfer safely, minor use of hands    Standing Unsupported with Eyes Closed Able to stand 10 seconds safely    Standing Unsupported with Feet Together Able to place feet together independently and stand 1 minute safely    From Standing, Reach Forward with Outstretched Arm Can reach confidently >25 cm (10)    From Standing Position, Pick up Object from Floor Able to pick up shoe safely and easily    From Standing Position, Turn to Look Behind Over each Shoulder  Looks behind from both sides and weight shifts well    Turn 360 Degrees Able to turn 360 degrees safely in 4 seconds or less    Standing Unsupported, Alternately Place Feet on Step/Stool Able to stand independently and complete 8 steps >20 seconds    Standing Unsupported, One Foot in Front Able to plae foot ahead of the other independently and hold 30 seconds    Standing on One Leg Able to lift leg independently and hold 5-10 seconds    Total Score 53      Functional Gait  Assessment   Gait assessed  Yes    Gait Level Surface Walks 20 ft in less than 7 sec but greater than 5.5 sec, uses assistive device, slower speed, mild gait deviations, or deviates 6-10 in outside of the 12 in walkway width.    Change in Gait Speed Able to smoothly change walking speed without loss of balance or gait deviation. Deviate no more than 6 in outside of the 12 in walkway width.    Gait with Horizontal Head Turns Performs head turns smoothly with slight change in gait velocity (eg, minor disruption to smooth gait path), deviates 6-10 in outside 12 in walkway width, or uses an assistive device.    Gait with Vertical Head Turns Performs head turns with no change in gait. Deviates no more than 6 in outside 12 in walkway width.    Gait and Pivot Turn Pivot turns safely within 3 sec and stops quickly with no loss of balance.    Step Over Obstacle Is able to step over one shoe box (4.5 in total height) without changing gait speed. No evidence of imbalance.    Gait with Narrow Base of Support Ambulates 4-7 steps.    Gait with Eyes Closed Walks 20 ft, uses assistive device, slower speed, mild gait deviations, deviates  6-10 in outside 12 in walkway width. Ambulates 20 ft in less than 9 sec but greater than 7 sec.    Ambulating Backwards Walks 20 ft, uses assistive device, slower speed, mild gait deviations, deviates 6-10 in outside 12 in walkway width.    Steps Two feet to a stair, must use rail.   two feet to a step but  doesn't have to use rail   Total Score 21           GAIT: Distance walked: Into clinic Assistive device utilized: None Level of assistance: Complete Independence Comments: Mildly antalgic, hip instability, reciprocal pattern                                                                                                                                TREATMENT DATE: 08/21/24 To be initiated    PATIENT EDUCATION:  Education details: Exam findings, POC Person educated: Patient and Spouse Education method: Medical Illustrator Education comprehension: verbalized understanding  HOME EXERCISE PROGRAM:   ASSESSMENT:  CLINICAL IMPRESSION: Patient is a 78 y.o. M who was seen today for physical therapy evaluation and treatment for L knee pain exacerbated s/p fall. PMH is significant for L TKA revision ~6 months ago, prior R TKA, chronic back pain. Assessment is significant for bilat LE weakness (especially hip flexors, abductors and adductors) with reduced single limb stability notable with balance testing. Pt is a moderate to high fall risk based on his FGA. Pain mostly along L pes anserine. Pt will benefit from PT to address these deficits to improve his overall home and community mobility.  OBJECTIVE IMPAIRMENTS: Abnormal gait, decreased activity tolerance, decreased balance, decreased coordination, decreased endurance, decreased mobility, difficulty walking, decreased ROM, decreased strength, improper body mechanics, postural dysfunction, and pain.   ACTIVITY LIMITATIONS: standing, squatting, stairs, transfers, and locomotion level  PARTICIPATION LIMITATIONS: cleaning, laundry, shopping, community activity, and yard work  PERSONAL FACTORS: Age, Fitness, Past/current experiences, and Time since onset of injury/illness/exacerbation are also affecting patient's functional outcome.   REHAB POTENTIAL: Good  CLINICAL DECISION MAKING: Evolving/moderate complexity  EVALUATION  COMPLEXITY: Moderate   GOALS: Goals reviewed with patient? Yes  SHORT TERM GOALS: Target date: 09/18/2024  Pt will be ind with initial HEP Baseline: Goal status: INITIAL  2.  Pt will be able to tolerate tandem stance for at least 30 sec on compliant surface Baseline:  Goal status: INITIAL   LONG TERM GOALS: Target date: 10/16/2024   Pt will be ind with management and progression of HEP Baseline:  Goal status: INITIAL  2.  Pt will have improved SLS to at least 10 sec to demo increased single leg stability Baseline:  Goal status: INITIAL  3.  Pt will have improved FGA to >/=26/30 Baseline:  Goal status: INITIAL  4.  Pt will report reduced knee pain by >/=75% Baseline:  Goal status: INITIAL  5.  Pt will demo at least 4/5 hip strength Baseline:  Goal status: INITIAL     PLAN:  PT FREQUENCY: 2x/week  PT DURATION: 8 weeks  PLANNED INTERVENTIONS: 97164- PT Re-evaluation, 97750- Physical Performance Testing, 97110-Therapeutic exercises, 97530- Therapeutic activity, [id number]- Neuromuscular re-education, 97535- Self Care, 02859- Manual therapy, [phone]- Gait training, [phone]- Aquatic Therapy, [phone]- Electrical stimulation (unattended), [id number]- Ultrasound, [id number]- Ionotophoresis 4mg /ml Dexamethasone , 79439 (1-2 muscles), 20561 (3+ muscles)- Dry Needling, Patient/Family education, Balance training, Stair training, Taping, Joint mobilization, Cryotherapy, and Moist heat  PLAN FOR NEXT SESSION: Consider ionto, ultrasound and taping for L medial knee pain. Strengthen hip flexors, adductors, abductors. Work on single limb strength/stability and weight shifting. High level balance with cognitive tasks.    Nuha Degner April Ma L Arielis Leonhart, PT 08/21/2024, 3:40 PM

## 2024-08-26 ENCOUNTER — Ambulatory Visit

## 2024-08-26 DIAGNOSIS — Z9181 History of falling: Secondary | ICD-10-CM | POA: Insufficient documentation

## 2024-08-26 DIAGNOSIS — M25562 Pain in left knee: Secondary | ICD-10-CM | POA: Insufficient documentation

## 2024-08-26 DIAGNOSIS — R600 Localized edema: Secondary | ICD-10-CM | POA: Diagnosis present

## 2024-08-26 DIAGNOSIS — M6281 Muscle weakness (generalized): Secondary | ICD-10-CM | POA: Diagnosis present

## 2024-08-26 DIAGNOSIS — R2689 Other abnormalities of gait and mobility: Secondary | ICD-10-CM | POA: Insufficient documentation

## 2024-08-26 DIAGNOSIS — M25662 Stiffness of left knee, not elsewhere classified: Secondary | ICD-10-CM | POA: Diagnosis present

## 2024-08-26 NOTE — Therapy (Signed)
 OUTPATIENT PHYSICAL THERAPY LOWER EXTREMITY TREATMENT   Patient Name: Joel Kim MRN: [id number] DOB:[birth year], 78 y.o., male Today's Date: 08/26/2024  END OF SESSION:  PT End of Session - 08/26/24 1558     Visit Number 2    Number of Visits 16    Authorization Type [employer]    PT Start Time 1557    PT Stop Time 1644    PT Time Calculation (min) 47 min          Past Medical History:  Diagnosis Date   Anemia    Arthritis    Asthma    Atypical nevus 01/26/2006   right side of body (slight)   Basal cell carcinoma 11/17/2015   left ear rim tx cx3 27fu   Cancer (HCC)    Coronary artery disease    Diabetes mellitus without complication (HCC)    Fibromyalgia    GERD (gastroesophageal reflux disease)    Heart murmur    History of kidney stones    Hypertension    Hypothyroidism    Insomnia    Intractable migraine without aura    Melanoma (HCC) 10/10/2014   right ant. shoulder (exc)   Neuromuscular disorder (HCC)    feet   Pancreatitis    Pneumonia    PONV (postoperative nausea and vomiting)    Pre-diabetes    Prostate CA (HCC)    Sleep apnea    hx of no longer has per   Spondylosis of lumbar spine    Stroke (HCC) 2021   TIA x 2 some memory loss.   Thyroid disease    Past Surgical History:  Procedure Laterality Date   BACK SURGERY  2013   L4-L5 and a redo in 2014 after a fall   CHOLECYSTECTOMY  1992   FOOT SURGERY Left 1999   IVC FILTER INSERTION N/A 03/18/2024   Procedure: IVC FILTER INSERTION;  Surgeon: Sheree Penne Bruckner, MD;  Location: [facility] INVASIVE CV LAB;  Service: Cardiovascular;  Laterality: N/A;   IVC FILTER REMOVAL N/A 07/08/2024   Procedure: IVC FILTER REMOVAL;  Surgeon: Sheree Penne Bruckner, MD;  Location: [facility] INVASIVE CV LAB;  Service: Cardiovascular;  Laterality: N/A;   IVC VENOGRAPHY N/A 07/08/2024   Procedure: IVC Venography;  Surgeon: Sheree Penne Bruckner, MD;  Location: [facility] INVASIVE CV LAB;  Service:  Cardiovascular;  Laterality: N/A;   MOHS SURGERY     PROSTATECTOMY  2008   REIMPLANTATION OF TOTAL KNEE Left 03/27/2024   Procedure: REVISION, TOTAL ARTHROPLASTY, KNEE;  Surgeon: Fidel Rogue, MD;  Location: [facility];  Service: Orthopedics;  Laterality: Left;   ROTATOR CUFF REPAIR Left 2021   SPINAL CORD STIMULATOR IMPLANT  2020   TOTAL KNEE ARTHROPLASTY Right 2005   TOTAL KNEE ARTHROPLASTY Left 01/19/2022   Procedure: TOTAL KNEE ARTHROPLASTY;  Surgeon: Gerome Charleston, MD;  Location: [facility];  Service: Orthopedics;  Laterality: Left;  adductor canal 120   Patient Active Problem List   Diagnosis Date Noted   S/P revision of total knee, 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   Sepsis due to undetermined organism (HCC) 04/23/2023   Chronic migraine without aura, intractable, with status migrainosus 03/09/2023   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HCC) 01/25/2022   S/P total knee arthroplasty, 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   Hypokalemia 01/25/2022   Acute anemia 01/25/2022   Acute metabolic encephalopathy 01/25/2022   Osteoarthritis of left knee 01/19/2022   Atypical chest pain 08/15/2018   Hyperlipidemia 08/15/2018   Family history of heart disease 08/15/2018   Hypertension 05/31/2018  Hypothyroidism 05/31/2018   Acute pancreatitis 05/31/2018   Mild renal insufficiency 05/31/2018    PCP:   REFERRING PROVIDER: Fidel Rogue, MD  REFERRING DIAG: M70.52 (ICD-10-CM) - Other bursitis of knee, left knee  THERAPY DIAG:  Acute pain of left knee  History of falling  Muscle weakness (generalized)  Other abnormalities of gait and mobility  Stiffness of left knee, not elsewhere classified  Localized edema  Rationale for Evaluation and Treatment: Rehabilitation  ONSET DATE: 3 weeks ago  SUBJECTIVE:   SUBJECTIVE STATEMENT: Pt reports 4/10 left knee pain.    PERTINENT HISTORY: Back pain,   PAIN:  Are you having pain? Yes: NPRS scale: 4/10 Pain location: Front of L knee into medial knee Pain description: aching,  throbbing Aggravating factors: Walking, squat Relieving factors: pain medicine, pain patches  PRECAUTIONS: Fall  RED FLAGS: None   WEIGHT BEARING RESTRICTIONS: No  FALLS:  Has patient fallen in last 6 months? Yes. Number of falls 2  LIVING ENVIRONMENT: Lives with: lives with their spouse Lives in: House/apartment Stairs: No; 3 steps at most in the front Has following equipment at home: None  OCCUPATION: Takes care of farm animals  PLOF: Independent  PATIENT GOALS: Decrease pain, less difficulty with work activities, improve movement, return to rec activities, improve strength, less difficulty with home activities  NEXT MD VISIT: none  OBJECTIVE:  Note: Objective measures were completed at Evaluation unless otherwise noted.  DIAGNOSTIC FINDINGS: last x-ray 03/27/24 on Epic demonstrating L knee arthroplasty revision  PATIENT SURVEYS:  LEFS  Extreme difficulty/unable (0), Quite a bit of difficulty (1), Moderate difficulty (2), Little difficulty (3), No difficulty (4) Survey date:  08/21/24  Any of your usual work, housework or school activities 1  2. Usual hobbies, recreational or sporting activities 1  3. Getting into/out of the bath 3  4. Walking between rooms 4  5. Putting on socks/shoes 2  6. Squatting  1  7. Lifting an object, like a bag of groceries from the floor 2  8. Performing light activities around your home 2  9. Performing heavy activities around your home 1  10. Getting into/out of a car 3  11. Walking 2 blocks 1  12. Walking 1 mile 1  13. Going up/down 10 stairs (1 flight) 1  14. Standing for 1 hour 0  15.  sitting for 1 hour 4  16. Running on even ground 0  17. Running on uneven ground 0  18. Making sharp turns while running fast 0  19. Hopping  1  20. Rolling over in bed 2  Score total:  30     COGNITION: Overall cognitive status: Within functional limits for tasks assessed     SENSATION: WFL  EDEMA:  Mild  MUSCLE LENGTH: Did not  assess  POSTURE: right pelvic obliquity  PALPATION: TTP medial knee  LOWER EXTREMITY ROM:  Active ROM Right eval Left eval  Hip flexion    Hip extension    Hip abduction    Hip adduction    Hip internal rotation    Hip external rotation    Knee flexion    Knee extension    Ankle dorsiflexion    Ankle plantarflexion    Ankle inversion    Ankle eversion     (Blank rows = not tested)  LOWER EXTREMITY MMT:  MMT Right eval Left eval  Hip flexion 3+ 3+  Hip extension Bridge 58 sec  Hip abduction 3+ 3+  Hip adduction 3+ 3+  Hip internal rotation    Hip external rotation    Knee flexion 5 5  Knee extension 4- 4-  Ankle dorsiflexion    Ankle plantarflexion    Ankle inversion    Ankle eversion     (Blank rows = not tested)  LOWER EXTREMITY SPECIAL TESTS:  Did not assess  FUNCTIONAL TESTS:  5 times sit to stand: <10 sec Berg Balance Test: 53 FGA: 21     GAIT: Distance walked: Into clinic Assistive device utilized: None Level of assistance: Complete Independence Comments: Mildly antalgic, hip instability, reciprocal pattern                                                                                                                                TREATMENT DATE:    08/26/24                                  EXERCISE LOG  Exercise Repetitions and Resistance Comments  Nustep  Lvl 3 x 15 mins; seat 5   Rockerboard 4 mins   Forward Step Ups 8 box x 25 reps bil    Backward Step Ups  8 box x 25 reps bil   LAQs 3# x 25 reps bil   Seated Marches 3# x 25 reps bil   Seated Hip Abduction Red x 3 mins   Seated Hip Adduction 3 mins   Seated Ham Curls Red x 25 reps   STSs     Blank cell = exercise not performed today   08/21/24 To be initiated    PATIENT EDUCATION:  Education details: Exam findings, POC Person educated: Patient and Spouse Education method: Medical Illustrator Education comprehension: verbalized understanding  HOME EXERCISE  PROGRAM:   ASSESSMENT:  CLINICAL IMPRESSION: Pt arrives for today's treatment session reporting 4/10 left knee pain.  Pt introduced to several standing and seated exercises to increase strength and function.  Pt requiring very minimal cues for proper technique and pacing.  Pt reported decreased pain at completion of today's treatment session.  OBJECTIVE IMPAIRMENTS: Abnormal gait, decreased activity tolerance, decreased balance, decreased coordination, decreased endurance, decreased mobility, difficulty walking, decreased ROM, decreased strength, improper body mechanics, postural dysfunction, and pain.   ACTIVITY LIMITATIONS: standing, squatting, stairs, transfers, and locomotion level  PARTICIPATION LIMITATIONS: cleaning, laundry, shopping, community activity, and yard work  PERSONAL FACTORS: Age, Fitness, Past/current experiences, and Time since onset of injury/illness/exacerbation are also affecting patient's functional outcome.   REHAB POTENTIAL: Good  CLINICAL DECISION MAKING: Evolving/moderate complexity  EVALUATION COMPLEXITY: Moderate   GOALS: Goals reviewed with patient? Yes  SHORT TERM GOALS: Target date: 09/18/2024  Pt will be ind with initial HEP Baseline: Goal status: INITIAL  2.  Pt will be able to tolerate tandem stance for at least 30 sec on compliant surface Baseline:  Goal status: INITIAL   LONG TERM GOALS: Target date:  10/16/2024   Pt will be ind with management and progression of HEP Baseline:  Goal status: INITIAL  2.  Pt will have improved SLS to at least 10 sec to demo increased single leg stability Baseline:  Goal status: INITIAL  3.  Pt will have improved FGA to >/=26/30 Baseline:  Goal status: INITIAL  4.  Pt will report reduced knee pain by >/=75% Baseline:  Goal status: INITIAL  5.  Pt will demo at least 4/5 hip strength Baseline:  Goal status: INITIAL     PLAN:  PT FREQUENCY: 2x/week  PT DURATION: 8 weeks  PLANNED  INTERVENTIONS: 97164- PT Re-evaluation, 97750- Physical Performance Testing, 97110-Therapeutic exercises, 97530- Therapeutic activity, 97112- Neuromuscular re-education, 97535- Self Care, 02859- Manual therapy, [phone]- Gait training, [phone]- Aquatic Therapy, [phone]- Electrical stimulation (unattended), [id number]- Ultrasound, [id number]- Ionotophoresis 4mg /ml Dexamethasone , 79439 (1-2 muscles), 20561 (3+ muscles)- Dry Needling, Patient/Family education, Balance training, Stair training, Taping, Joint mobilization, Cryotherapy, and Moist heat  PLAN FOR NEXT SESSION: Consider ionto, ultrasound and taping for L medial knee pain. Strengthen hip flexors, adductors, abductors. Work on single limb strength/stability and weight shifting. High level balance with cognitive tasks.    Delon DELENA Gosling, PTA 08/26/2024, 4:46 PM

## 2024-08-28 ENCOUNTER — Ambulatory Visit

## 2024-09-02 ENCOUNTER — Ambulatory Visit: Admitting: *Deleted

## 2024-09-03 ENCOUNTER — Encounter: Payer: Self-pay | Admitting: *Deleted

## 2024-09-03 ENCOUNTER — Ambulatory Visit: Admitting: *Deleted

## 2024-09-03 DIAGNOSIS — M25562 Pain in left knee: Secondary | ICD-10-CM

## 2024-09-03 DIAGNOSIS — Z9181 History of falling: Secondary | ICD-10-CM

## 2024-09-03 NOTE — Therapy (Signed)
 OUTPATIENT PHYSICAL THERAPY LOWER EXTREMITY TREATMENT   Patient Name: Joel Kim MRN: [id number] DOB:1946-07-21, 78 y.o., male Today's Date: 09/03/2024  END OF SESSION:  PT End of Session - 09/03/24 1647     Visit Number 3    Number of Visits 16    Authorization Type [employer]    PT Start Time 1647    PT Stop Time 1731    PT Time Calculation (min) 44 min          Past Medical History:  Diagnosis Date   Anemia    Arthritis    Asthma    Atypical nevus 01/26/2006   right side of body (slight)   Basal cell carcinoma 11/17/2015   left ear rim tx cx3 48fu   Cancer (HCC)    Coronary artery disease    Diabetes mellitus without complication (HCC)    Fibromyalgia    GERD (gastroesophageal reflux disease)    Heart murmur    History of kidney stones    Hypertension    Hypothyroidism    Insomnia    Intractable migraine without aura    Melanoma (HCC) 10/10/2014   right ant. shoulder (exc)   Neuromuscular disorder (HCC)    feet   Pancreatitis    Pneumonia    PONV (postoperative nausea and vomiting)    Pre-diabetes    Prostate CA (HCC)    Sleep apnea    hx of no longer has per   Spondylosis of lumbar spine    Stroke (HCC) 2021   TIA x 2 some memory loss.   Thyroid disease    Past Surgical History:  Procedure Laterality Date   BACK SURGERY  2013   L4-L5 and a redo in 2014 after a fall   CHOLECYSTECTOMY  1992   FOOT SURGERY Left 1999   IVC FILTER INSERTION N/A 03/18/2024   Procedure: IVC FILTER INSERTION;  Surgeon: Sheree Penne Bruckner, MD;  Location: [facility] INVASIVE CV LAB;  Service: Cardiovascular;  Laterality: N/A;   IVC FILTER REMOVAL N/A 07/08/2024   Procedure: IVC FILTER REMOVAL;  Surgeon: Sheree Penne Bruckner, MD;  Location: [facility] INVASIVE CV LAB;  Service: Cardiovascular;  Laterality: N/A;   IVC VENOGRAPHY N/A 07/08/2024   Procedure: IVC Venography;  Surgeon: Sheree Penne Bruckner, MD;  Location: [facility] INVASIVE CV LAB;  Service:  Cardiovascular;  Laterality: N/A;   MOHS SURGERY     PROSTATECTOMY  2008   REIMPLANTATION OF TOTAL KNEE Left 03/27/2024   Procedure: REVISION, TOTAL ARTHROPLASTY, KNEE;  Surgeon: Fidel Rogue, MD;  Location: [facility];  Service: Orthopedics;  Laterality: Left;   ROTATOR CUFF REPAIR Left 2021   SPINAL CORD STIMULATOR IMPLANT  2020   TOTAL KNEE ARTHROPLASTY Right 2005   TOTAL KNEE ARTHROPLASTY Left 01/19/2022   Procedure: TOTAL KNEE ARTHROPLASTY;  Surgeon: Gerome Charleston, MD;  Location: [facility];  Service: Orthopedics;  Laterality: Left;  adductor canal 120   Patient Active Problem List   Diagnosis Date Noted   S/P revision of total knee, 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   Sepsis due to undetermined organism (HCC) 04/23/2023   Chronic migraine without aura, intractable, with status migrainosus 03/09/2023   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HCC) 01/25/2022   S/P total knee arthroplasty, 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   Hypokalemia 01/25/2022   Acute anemia 01/25/2022   Acute metabolic encephalopathy 01/25/2022   Osteoarthritis of left knee 01/19/2022   Atypical chest pain 08/15/2018   Hyperlipidemia 08/15/2018   Family history of heart disease 08/15/2018   Hypertension 05/31/2018  Hypothyroidism 05/31/2018   Acute pancreatitis 05/31/2018   Mild renal insufficiency 05/31/2018    PCP:   REFERRING PROVIDER: Fidel Rogue, MD  REFERRING DIAG: M70.52 (ICD-10-CM) - Other bursitis of knee, left knee  THERAPY DIAG:  Acute pain of left knee  History of falling  Rationale for Evaluation and Treatment: Rehabilitation  ONSET DATE: 3 weeks ago  SUBJECTIVE:   SUBJECTIVE STATEMENT: Pt reports 3-4/10 left knee pain.    PERTINENT HISTORY: Back pain,   PAIN:  Are you having pain? Yes: NPRS scale: 4/10 Pain location: Front of L knee into medial knee Pain description: aching, throbbing Aggravating factors: Walking, squat Relieving factors: pain medicine, pain patches  PRECAUTIONS: Fall  RED FLAGS: None   WEIGHT BEARING  RESTRICTIONS: No  FALLS:  Has patient fallen in last 6 months? Yes. Number of falls 2  LIVING ENVIRONMENT: Lives with: lives with their spouse Lives in: House/apartment Stairs: No; 3 steps at most in the front Has following equipment at home: None  OCCUPATION: Takes care of farm animals  PLOF: Independent  PATIENT GOALS: Decrease pain, less difficulty with work activities, improve movement, return to rec activities, improve strength, less difficulty with home activities  NEXT MD VISIT: none  OBJECTIVE:  Note: Objective measures were completed at Evaluation unless otherwise noted.  DIAGNOSTIC FINDINGS: last x-ray 03/27/24 on Epic demonstrating L knee arthroplasty revision  PATIENT SURVEYS:  LEFS  Extreme difficulty/unable (0), Quite a bit of difficulty (1), Moderate difficulty (2), Little difficulty (3), No difficulty (4) Survey date:  08/21/24  Any of your usual work, housework or school activities 1  2. Usual hobbies, recreational or sporting activities 1  3. Getting into/out of the bath 3  4. Walking between rooms 4  5. Putting on socks/shoes 2  6. Squatting  1  7. Lifting an object, like a bag of groceries from the floor 2  8. Performing light activities around your home 2  9. Performing heavy activities around your home 1  10. Getting into/out of a car 3  11. Walking 2 blocks 1  12. Walking 1 mile 1  13. Going up/down 10 stairs (1 flight) 1  14. Standing for 1 hour 0  15.  sitting for 1 hour 4  16. Running on even ground 0  17. Running on uneven ground 0  18. Making sharp turns while running fast 0  19. Hopping  1  20. Rolling over in bed 2  Score total:  30     COGNITION: Overall cognitive status: Within functional limits for tasks assessed     SENSATION: WFL  EDEMA:  Mild  MUSCLE LENGTH: Did not assess  POSTURE: right pelvic obliquity  PALPATION: TTP medial knee  LOWER EXTREMITY ROM:  Active ROM Right eval Left eval  Hip flexion    Hip  extension    Hip abduction    Hip adduction    Hip internal rotation    Hip external rotation    Knee flexion    Knee extension    Ankle dorsiflexion    Ankle plantarflexion    Ankle inversion    Ankle eversion     (Blank rows = not tested)  LOWER EXTREMITY MMT:  MMT Right eval Left eval  Hip flexion 3+ 3+  Hip extension Bridge 58 sec  Hip abduction 3+ 3+  Hip adduction 3+ 3+  Hip internal rotation    Hip external rotation    Knee flexion 5 5  Knee extension 4- 4-  Ankle dorsiflexion    Ankle plantarflexion    Ankle inversion    Ankle eversion     (Blank rows = not tested)  LOWER EXTREMITY SPECIAL TESTS:  Did not assess  FUNCTIONAL TESTS:  5 times sit to stand: <10 sec Berg Balance Test: 53 FGA: 21     GAIT: Distance walked: Into clinic Assistive device utilized: None Level of assistance: Complete Independence Comments: Mildly antalgic, hip instability, reciprocal pattern                                                                                                                                TREATMENT DATE:    09/03/24                                  EXERCISE LOG   LT knee  Exercise Repetitions and Resistance Comments  Nustep  Lvl 3 x 15 mins; seat 8 LE's only   Rockerboard 5 mins   Forward Step Ups 4, 6, and 8 box x 10 reps bil    Backward Step Ups  4 box x 25 reps bil   LAQs 3# x 25 reps bil   Seated Marches 3# x 25 reps bil   Seated Hip Abduction Green  x 3 mins   Seated Hip Adduction 3 mins   Seated Ham Curls Red    STSs     Blank cell = exercise not performed today  Discussed Home TENS use and ICE on LT knee  08/21/24 To be initiated    PATIENT EDUCATION:  Education details: Exam findings, POC Person educated: Patient and Spouse Education method: Medical Illustrator Education comprehension: verbalized understanding  HOME EXERCISE PROGRAM:   ASSESSMENT:  CLINICAL IMPRESSION: Pt arrives for today's treatment  session reporting 4/10 left knee pain. He was able to continue with standing and seated therex for LT knee and did well. Discussed Home TENS and ICE    OBJECTIVE IMPAIRMENTS: Abnormal gait, decreased activity tolerance, decreased balance, decreased coordination, decreased endurance, decreased mobility, difficulty walking, decreased ROM, decreased strength, improper body mechanics, postural dysfunction, and pain.   ACTIVITY LIMITATIONS: standing, squatting, stairs, transfers, and locomotion level  PARTICIPATION LIMITATIONS: cleaning, laundry, shopping, community activity, and yard work  PERSONAL FACTORS: Age, Fitness, Past/current experiences, and Time since onset of injury/illness/exacerbation are also affecting patient's functional outcome.   REHAB POTENTIAL: Good  CLINICAL DECISION MAKING: Evolving/moderate complexity  EVALUATION COMPLEXITY: Moderate   GOALS: Goals reviewed with patient? Yes  SHORT TERM GOALS: Target date: 09/18/2024  Pt will be ind with initial HEP Baseline: Goal status: INITIAL  2.  Pt will be able to tolerate tandem stance for at least 30 sec on compliant surface Baseline:  Goal status: INITIAL   LONG TERM GOALS: Target date: 10/16/2024   Pt will be ind with management and progression of HEP Baseline:  Goal status: INITIAL  2.  Pt will have improved SLS to at least 10 sec to demo increased single leg stability Baseline:  Goal status: INITIAL  3.  Pt will have improved FGA to >/=26/30 Baseline:  Goal status: INITIAL  4.  Pt will report reduced knee pain by >/=75% Baseline:  Goal status: INITIAL  5.  Pt will demo at least 4/5 hip strength Baseline:  Goal status: INITIAL     PLAN:  PT FREQUENCY: 2x/week  PT DURATION: 8 weeks  PLANNED INTERVENTIONS: 97164- PT Re-evaluation, 97750- Physical Performance Testing, 97110-Therapeutic exercises, 97530- Therapeutic activity, 97112- Neuromuscular re-education, 97535- Self Care, 02859- Manual  therapy, [phone]- Gait training, [phone]- Aquatic Therapy, [phone]- Electrical stimulation (unattended), [id number]- Ultrasound, [id number]- Ionotophoresis 4mg /ml Dexamethasone , 79439 (1-2 muscles), 20561 (3+ muscles)- Dry Needling, Patient/Family education, Balance training, Stair training, Taping, Joint mobilization, Cryotherapy, and Moist heat  PLAN FOR NEXT SESSION: Consider ionto, ultrasound and taping for L medial knee pain. Strengthen hip flexors, adductors, abductors. Work on single limb strength/stability and weight shifting. High level balance with cognitive tasks.    Nestor Wieneke,CHRIS, PTA 09/03/2024, 5:40 PM

## 2024-09-05 ENCOUNTER — Ambulatory Visit: Admitting: *Deleted

## 2024-09-09 ENCOUNTER — Ambulatory Visit: Admitting: *Deleted

## 2024-09-09 ENCOUNTER — Encounter: Payer: Self-pay | Admitting: *Deleted

## 2024-09-09 DIAGNOSIS — M25562 Pain in left knee: Secondary | ICD-10-CM

## 2024-09-09 DIAGNOSIS — Z9181 History of falling: Secondary | ICD-10-CM

## 2024-09-09 NOTE — Therapy (Signed)
 OUTPATIENT PHYSICAL THERAPY LOWER EXTREMITY TREATMENT   Patient Name: JERMALE CRASS MRN: [id number] DOB:05/19/1946, 78 y.o., male Today's Date: 09/09/2024  END OF SESSION:  PT End of Session - 09/09/24 1035     Visit Number 4    Number of Visits 16    Authorization Type [employer]    PT Start Time 1015    PT Stop Time 1155    PT Time Calculation (min) 100 min          Past Medical History:  Diagnosis Date   Anemia    Arthritis    Asthma    Atypical nevus 01/26/2006   right side of body (slight)   Basal cell carcinoma 11/17/2015   left ear rim tx cx3 28fu   Cancer (HCC)    Coronary artery disease    Diabetes mellitus without complication (HCC)    Fibromyalgia    GERD (gastroesophageal reflux disease)    Heart murmur    History of kidney stones    Hypertension    Hypothyroidism    Insomnia    Intractable migraine without aura    Melanoma (HCC) 10/10/2014   right ant. shoulder (exc)   Neuromuscular disorder (HCC)    feet   Pancreatitis    Pneumonia    PONV (postoperative nausea and vomiting)    Pre-diabetes    Prostate CA (HCC)    Sleep apnea    hx of no longer has per   Spondylosis of lumbar spine    Stroke (HCC) 2021   TIA x 2 some memory loss.   Thyroid disease    Past Surgical History:  Procedure Laterality Date   BACK SURGERY  2013   L4-L5 and a redo in 2014 after a fall   CHOLECYSTECTOMY  1992   FOOT SURGERY Left 1999   IVC FILTER INSERTION N/A 03/18/2024   Procedure: IVC FILTER INSERTION;  Surgeon: Sheree Penne Bruckner, MD;  Location: [facility] INVASIVE CV LAB;  Service: Cardiovascular;  Laterality: N/A;   IVC FILTER REMOVAL N/A 07/08/2024   Procedure: IVC FILTER REMOVAL;  Surgeon: Sheree Penne Bruckner, MD;  Location: [facility] INVASIVE CV LAB;  Service: Cardiovascular;  Laterality: N/A;   IVC VENOGRAPHY N/A 07/08/2024   Procedure: IVC Venography;  Surgeon: Sheree Penne Bruckner, MD;  Location: [facility] INVASIVE CV LAB;  Service:  Cardiovascular;  Laterality: N/A;   MOHS SURGERY     PROSTATECTOMY  2008   REIMPLANTATION OF TOTAL KNEE Left 03/27/2024   Procedure: REVISION, TOTAL ARTHROPLASTY, KNEE;  Surgeon: Fidel Rogue, MD;  Location: [facility];  Service: Orthopedics;  Laterality: Left;   ROTATOR CUFF REPAIR Left 2021   SPINAL CORD STIMULATOR IMPLANT  2020   TOTAL KNEE ARTHROPLASTY Right 2005   TOTAL KNEE ARTHROPLASTY Left 01/19/2022   Procedure: TOTAL KNEE ARTHROPLASTY;  Surgeon: Gerome Charleston, MD;  Location: [facility];  Service: Orthopedics;  Laterality: Left;  adductor canal 120   Patient Active Problem List   Diagnosis Date Noted   S/P revision of total knee, 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   Sepsis due to undetermined organism (HCC) 04/23/2023   Chronic migraine without aura, intractable, with status migrainosus 03/09/2023   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HCC) 01/25/2022   S/P total knee arthroplasty, 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   Hypokalemia 01/25/2022   Acute anemia 01/25/2022   Acute metabolic encephalopathy 01/25/2022   Osteoarthritis of left knee 01/19/2022   Atypical chest pain 08/15/2018   Hyperlipidemia 08/15/2018   Family history of heart disease 08/15/2018   Hypertension 05/31/2018  Hypothyroidism 05/31/2018   Acute pancreatitis 05/31/2018   Mild renal insufficiency 05/31/2018    PCP:   REFERRING PROVIDER: Fidel Rogue, MD  REFERRING DIAG: M70.52 (ICD-10-CM) - Other bursitis of knee, left knee  THERAPY DIAG:  Acute pain of left knee  History of falling  Rationale for Evaluation and Treatment: Rehabilitation  ONSET DATE: 3 weeks ago  SUBJECTIVE:   SUBJECTIVE STATEMENT: Pt reports 3-4/10 left knee pain. MD said to continue with PT. Used TENS at home some   PERTINENT HISTORY: Back pain,   PAIN:  Are you having pain? Yes: NPRS scale: 4/10 Pain location: Front of L knee into medial knee Pain description: aching, throbbing Aggravating factors: Walking, squat Relieving factors: pain medicine, pain  patches  PRECAUTIONS: Fall  RED FLAGS: None   WEIGHT BEARING RESTRICTIONS: No  FALLS:  Has patient fallen in last 6 months? Yes. Number of falls 2  LIVING ENVIRONMENT: Lives with: lives with their spouse Lives in: House/apartment Stairs: No; 3 steps at most in the front Has following equipment at home: None  OCCUPATION: Takes care of farm animals  PLOF: Independent  PATIENT GOALS: Decrease pain, less difficulty with work activities, improve movement, return to rec activities, improve strength, less difficulty with home activities  NEXT MD VISIT: none  OBJECTIVE:  Note: Objective measures were completed at Evaluation unless otherwise noted.  DIAGNOSTIC FINDINGS: last x-ray 03/27/24 on Epic demonstrating L knee arthroplasty revision  PATIENT SURVEYS:  LEFS  Extreme difficulty/unable (0), Quite a bit of difficulty (1), Moderate difficulty (2), Little difficulty (3), No difficulty (4) Survey date:  08/21/24  Any of your usual work, housework or school activities 1  2. Usual hobbies, recreational or sporting activities 1  3. Getting into/out of the bath 3  4. Walking between rooms 4  5. Putting on socks/shoes 2  6. Squatting  1  7. Lifting an object, like a bag of groceries from the floor 2  8. Performing light activities around your home 2  9. Performing heavy activities around your home 1  10. Getting into/out of a car 3  11. Walking 2 blocks 1  12. Walking 1 mile 1  13. Going up/down 10 stairs (1 flight) 1  14. Standing for 1 hour 0  15.  sitting for 1 hour 4  16. Running on even ground 0  17. Running on uneven ground 0  18. Making sharp turns while running fast 0  19. Hopping  1  20. Rolling over in bed 2  Score total:  30     COGNITION: Overall cognitive status: Within functional limits for tasks assessed     SENSATION: WFL  EDEMA:  Mild  MUSCLE LENGTH: Did not assess  POSTURE: right pelvic obliquity  PALPATION: TTP medial knee  LOWER EXTREMITY  ROM:  Active ROM Right eval Left eval  Hip flexion    Hip extension    Hip abduction    Hip adduction    Hip internal rotation    Hip external rotation    Knee flexion    Knee extension    Ankle dorsiflexion    Ankle plantarflexion    Ankle inversion    Ankle eversion     (Blank rows = not tested)  LOWER EXTREMITY MMT:  MMT Right eval Left eval  Hip flexion 3+ 3+  Hip extension Bridge 58 sec  Hip abduction 3+ 3+  Hip adduction 3+ 3+  Hip internal rotation    Hip external rotation  Knee flexion 5 5  Knee extension 4- 4-  Ankle dorsiflexion    Ankle plantarflexion    Ankle inversion    Ankle eversion     (Blank rows = not tested)  LOWER EXTREMITY SPECIAL TESTS:  Did not assess  FUNCTIONAL TESTS:  5 times sit to stand: <10 sec Berg Balance Test: 53 FGA: 21     GAIT: Distance walked: Into clinic Assistive device utilized: None Level of assistance: Complete Independence Comments: Mildly antalgic, hip instability, reciprocal pattern                                                                                                                                TREATMENT DATE:    09/09/24                                  EXERCISE LOG   LT knee  Exercise Repetitions and Resistance Comments  Nustep  Lvl 3-5 x 18 mins; seat 8 LE's only   Rockerboard 5 mins   Forward Step Ups 4, 6, and 8 box x 10 reps bil    Backward Step Ups  4 box x 25 reps bil   LAQs 3# x 25 reps bil   Seated Marches 3# x 25 reps bil   Seated Hip Abduction Green  x 3 mins   Seated Hip Adduction 3 mins   Seated Ham Curls Red x 3 mins   STSs     Blank cell = exercise not performed today    08/21/24 To be initiated    PATIENT EDUCATION:  Education details: Exam findings, POC Person educated: Patient and Spouse Education method: Medical Illustrator Education comprehension: verbalized understanding  HOME EXERCISE PROGRAM:   ASSESSMENT:  CLINICAL IMPRESSION: Pt  arrives for today's treatment session reporting 3-4/10 left knee pain. He was able to continue with standing and seated therex for LT knee and did well again.    OBJECTIVE IMPAIRMENTS: Abnormal gait, decreased activity tolerance, decreased balance, decreased coordination, decreased endurance, decreased mobility, difficulty walking, decreased ROM, decreased strength, improper body mechanics, postural dysfunction, and pain.   ACTIVITY LIMITATIONS: standing, squatting, stairs, transfers, and locomotion level  PARTICIPATION LIMITATIONS: cleaning, laundry, shopping, community activity, and yard work  PERSONAL FACTORS: Age, Fitness, Past/current experiences, and Time since onset of injury/illness/exacerbation are also affecting patient's functional outcome.   REHAB POTENTIAL: Good  CLINICAL DECISION MAKING: Evolving/moderate complexity  EVALUATION COMPLEXITY: Moderate   GOALS: Goals reviewed with patient? Yes  SHORT TERM GOALS: Target date: 09/18/2024  Pt will be ind with initial HEP Baseline: Goal status: MET  2.  Pt will be able to tolerate tandem stance for at least 30 sec on compliant surface Baseline:  Goal status: On going   LONG TERM GOALS: Target date: 10/16/2024   Pt will be ind with management and progression of HEP Baseline:  Goal status:  INITIAL  2.  Pt will have improved SLS to at least 10 sec to demo increased single leg stability Baseline:  Goal status: INITIAL  3.  Pt will have improved FGA to >/=26/30 Baseline:  Goal status: INITIAL  4.  Pt will report reduced knee pain by >/=75% Baseline:  Goal status: INITIAL  5.  Pt will demo at least 4/5 hip strength Baseline:  Goal status: INITIAL     PLAN:  PT FREQUENCY: 2x/week  PT DURATION: 8 weeks  PLANNED INTERVENTIONS: 97164- PT Re-evaluation, 97750- Physical Performance Testing, 97110-Therapeutic exercises, 97530- Therapeutic activity, 97112- Neuromuscular re-education, 97535- Self Care, 02859-  Manual therapy, [phone]- Gait training, [phone]- Aquatic Therapy, [phone]- Electrical stimulation (unattended), [id number]- Ultrasound, [id number]- Ionotophoresis 4mg /ml Dexamethasone , 79439 (1-2 muscles), 20561 (3+ muscles)- Dry Needling, Patient/Family education, Balance training, Stair training, Taping, Joint mobilization, Cryotherapy, and Moist heat  PLAN FOR NEXT SESSION: Consider ionto, ultrasound and taping for L medial knee pain. Strengthen hip flexors, adductors, abductors. Work on single limb strength/stability and weight shifting. High level balance with cognitive tasks.    Ernest Orr,CHRIS, PTA 09/09/2024, 11:57 AM

## 2024-09-12 ENCOUNTER — Ambulatory Visit: Admitting: *Deleted

## 2024-09-12 DIAGNOSIS — M25562 Pain in left knee: Secondary | ICD-10-CM | POA: Diagnosis not present

## 2024-09-12 NOTE — Therapy (Signed)
 OUTPATIENT PHYSICAL THERAPY LOWER EXTREMITY TREATMENT   Patient Name: Joel Kim MRN: [id number] DOB:[birth year], 78 y.o., male Today's Date: 09/12/2024  END OF SESSION:  PT End of Session - 09/12/24 1107     Visit Number 5    Number of Visits 16    Authorization Type [employer]    PT Start Time 1105    PT Stop Time 1152    PT Time Calculation (min) 47 min          Past Medical History:  Diagnosis Date   Anemia    Arthritis    Asthma    Atypical nevus 01/26/2006   right side of body (slight)   Basal cell carcinoma 11/17/2015   left ear rim tx cx3 63fu   Cancer (HCC)    Coronary artery disease    Diabetes mellitus without complication (HCC)    Fibromyalgia    GERD (gastroesophageal reflux disease)    Heart murmur    History of kidney stones    Hypertension    Hypothyroidism    Insomnia    Intractable migraine without aura    Melanoma (HCC) 10/10/2014   right ant. shoulder (exc)   Neuromuscular disorder (HCC)    feet   Pancreatitis    Pneumonia    PONV (postoperative nausea and vomiting)    Pre-diabetes    Prostate CA (HCC)    Sleep apnea    hx of no longer has per   Spondylosis of lumbar spine    Stroke (HCC) 2021   TIA x 2 some memory loss.   Thyroid disease    Past Surgical History:  Procedure Laterality Date   BACK SURGERY  2013   L4-L5 and a redo in 2014 after a fall   CHOLECYSTECTOMY  1992   FOOT SURGERY Left 1999   IVC FILTER INSERTION N/A 03/18/2024   Procedure: IVC FILTER INSERTION;  Surgeon: Sheree Penne Bruckner, MD;  Location: [facility] INVASIVE CV LAB;  Service: Cardiovascular;  Laterality: N/A;   IVC FILTER REMOVAL N/A 07/08/2024   Procedure: IVC FILTER REMOVAL;  Surgeon: Sheree Penne Bruckner, MD;  Location: [facility] INVASIVE CV LAB;  Service: Cardiovascular;  Laterality: N/A;   IVC VENOGRAPHY N/A 07/08/2024   Procedure: IVC Venography;  Surgeon: Sheree Penne Bruckner, MD;  Location: [facility] INVASIVE CV LAB;  Service:  Cardiovascular;  Laterality: N/A;   MOHS SURGERY     PROSTATECTOMY  2008   REIMPLANTATION OF TOTAL KNEE Left 03/27/2024   Procedure: REVISION, TOTAL ARTHROPLASTY, KNEE;  Surgeon: Fidel Rogue, MD;  Location: [facility];  Service: Orthopedics;  Laterality: Left;   ROTATOR CUFF REPAIR Left 2021   SPINAL CORD STIMULATOR IMPLANT  2020   TOTAL KNEE ARTHROPLASTY Right 2005   TOTAL KNEE ARTHROPLASTY Left 01/19/2022   Procedure: TOTAL KNEE ARTHROPLASTY;  Surgeon: Gerome Charleston, MD;  Location: [facility];  Service: Orthopedics;  Laterality: Left;  adductor canal 120   Patient Active Problem List   Diagnosis Date Noted   S/P revision of total knee, 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   Sepsis due to undetermined organism (HCC) 04/23/2023   Chronic migraine without aura, intractable, with status migrainosus 03/09/2023   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HCC) 01/25/2022   S/P total knee arthroplasty, 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   Hypokalemia 01/25/2022   Acute anemia 01/25/2022   Acute metabolic encephalopathy 01/25/2022   Osteoarthritis of left knee 01/19/2022   Atypical chest pain 08/15/2018   Hyperlipidemia 08/15/2018   Family history of heart disease 08/15/2018   Hypertension 05/31/2018  Hypothyroidism 05/31/2018   Acute pancreatitis 05/31/2018   Mild renal insufficiency 05/31/2018    PCP:   REFERRING PROVIDER: Fidel Rogue, MD  REFERRING DIAG: M70.52 (ICD-10-CM) - Other bursitis of knee, left knee  THERAPY DIAG:  Acute pain of left knee  Rationale for Evaluation and Treatment: Rehabilitation  ONSET DATE: 3 weeks ago  SUBJECTIVE:   SUBJECTIVE STATEMENT: Pt reports 3-4/10 left knee pain. Doing about the same. Put on hold 1-2 weeks  PERTINENT HISTORY: Back pain,   PAIN:  Are you having pain? Yes: NPRS scale: 4/10 Pain location: Front of L knee into medial knee Pain description: aching, throbbing Aggravating factors: Walking, squat Relieving factors: pain medicine, pain patches  PRECAUTIONS: Fall  RED  FLAGS: None   WEIGHT BEARING RESTRICTIONS: No  FALLS:  Has patient fallen in last 6 months? Yes. Number of falls 2  LIVING ENVIRONMENT: Lives with: lives with their spouse Lives in: House/apartment Stairs: No; 3 steps at most in the front Has following equipment at home: None  OCCUPATION: Takes care of farm animals  PLOF: Independent  PATIENT GOALS: Decrease pain, less difficulty with work activities, improve movement, return to rec activities, improve strength, less difficulty with home activities  NEXT MD VISIT: none  OBJECTIVE:  Note: Objective measures were completed at Evaluation unless otherwise noted.  DIAGNOSTIC FINDINGS: last x-ray 03/27/24 on Epic demonstrating L knee arthroplasty revision  PATIENT SURVEYS:  LEFS  Extreme difficulty/unable (0), Quite a bit of difficulty (1), Moderate difficulty (2), Little difficulty (3), No difficulty (4) Survey date:  08/21/24  Any of your usual work, housework or school activities 1  2. Usual hobbies, recreational or sporting activities 1  3. Getting into/out of the bath 3  4. Walking between rooms 4  5. Putting on socks/shoes 2  6. Squatting  1  7. Lifting an object, like a bag of groceries from the floor 2  8. Performing light activities around your home 2  9. Performing heavy activities around your home 1  10. Getting into/out of a car 3  11. Walking 2 blocks 1  12. Walking 1 mile 1  13. Going up/down 10 stairs (1 flight) 1  14. Standing for 1 hour 0  15.  sitting for 1 hour 4  16. Running on even ground 0  17. Running on uneven ground 0  18. Making sharp turns while running fast 0  19. Hopping  1  20. Rolling over in bed 2  Score total:  30     COGNITION: Overall cognitive status: Within functional limits for tasks assessed     SENSATION: WFL  EDEMA:  Mild  MUSCLE LENGTH: Did not assess  POSTURE: right pelvic obliquity  PALPATION: TTP medial knee  LOWER EXTREMITY ROM:  Active ROM Right eval  Left eval  Hip flexion    Hip extension    Hip abduction    Hip adduction    Hip internal rotation    Hip external rotation    Knee flexion    Knee extension    Ankle dorsiflexion    Ankle plantarflexion    Ankle inversion    Ankle eversion     (Blank rows = not tested)  LOWER EXTREMITY MMT:  MMT Right eval Left eval  Hip flexion 3+ 3+  Hip extension Bridge 58 sec  Hip abduction 3+ 3+  Hip adduction 3+ 3+  Hip internal rotation    Hip external rotation    Knee flexion 5 5  Knee extension  4- 4-  Ankle dorsiflexion    Ankle plantarflexion    Ankle inversion    Ankle eversion     (Blank rows = not tested)  LOWER EXTREMITY SPECIAL TESTS:  Did not assess  FUNCTIONAL TESTS:  5 times sit to stand: <10 sec Berg Balance Test: 53 FGA: 21     GAIT: Distance walked: Into clinic Assistive device utilized: None Level of assistance: Complete Independence Comments: Mildly antalgic, hip instability, reciprocal pattern                                                                                                                                TREATMENT DATE:    09/12/24                                  EXERCISE LOG   LT knee  Exercise Repetitions and Resistance Comments  Nustep  Lvl 3-5 x 18 mins; seat 8 LE's only   Rockerboard 5 mins   Forward Step Ups  6 in and 8 in x 20   Backward Step Ups  4 box x 25 reps bil   LAQs 3# x 25 reps bil   Seated Marches 3# x 25 reps bil   Seated Hip Abduction Green  x 3 mins   Seated Hip Adduction 3 mins   Seated Ham Curls Red x 3 mins   Assess Goals See below    Blank cell = exercise not performed today    08/21/24 To be initiated    PATIENT EDUCATION:  Education details: Exam findings, POC Person educated: Patient and Spouse Education method: Medical Illustrator Education comprehension: verbalized understanding  HOME EXERCISE PROGRAM:   ASSESSMENT:  CLINICAL IMPRESSION: Pt arrives for today's  treatment session reporting 3-4/10 left knee pain and seems to be doing about the same. He was able to continue with standing and seated therex for LT knee and did well again. Pt was able to meet all STG's and 2 LTGs    OBJECTIVE IMPAIRMENTS: Abnormal gait, decreased activity tolerance, decreased balance, decreased coordination, decreased endurance, decreased mobility, difficulty walking, decreased ROM, decreased strength, improper body mechanics, postural dysfunction, and pain.   ACTIVITY LIMITATIONS: standing, squatting, stairs, transfers, and locomotion level  PARTICIPATION LIMITATIONS: cleaning, laundry, shopping, community activity, and yard work  PERSONAL FACTORS: Age, Fitness, Past/current experiences, and Time since onset of injury/illness/exacerbation are also affecting patient's functional outcome.   REHAB POTENTIAL: Good  CLINICAL DECISION MAKING: Evolving/moderate complexity  EVALUATION COMPLEXITY: Moderate   GOALS: Goals reviewed with patient? Yes  SHORT TERM GOALS: Target date: 09/18/2024  Pt will be ind with initial HEP Baseline: Goal status: MET  2.  Pt will be able to tolerate tandem stance for at least 30 sec on compliant surface Baseline:  Goal status: MET   LONG TERM GOALS: Target date: 10/16/2024   Pt will be ind  with management and progression of HEP Baseline:  Goal status: MET  2.  Pt will have improved SLS to at least 10 sec to demo increased single leg stability Baseline:  Goal status: MET  3.  Pt will have improved FGA to >/=26/30 Baseline:  Goal status: NM  4.  Pt will report reduced knee pain by >/=75% Baseline:  Goal status: NM  5.  Pt will demo at least 4/5 hip strength Baseline:  Goal status: NM  4/5     PLAN:  PT FREQUENCY: 2x/week  PT DURATION: 8 weeks  PLANNED INTERVENTIONS: 97164- PT Re-evaluation, 97750- Physical Performance Testing, 97110-Therapeutic exercises, 97530- Therapeutic activity, 97112- Neuromuscular  re-education, 97535- Self Care, [id number]- Manual therapy, [phone]- Gait training, [phone]- Aquatic Therapy, [phone]- Electrical stimulation (unattended), [id number]- Ultrasound, [id number]- Ionotophoresis 4mg /ml Dexamethasone , [id number] (1-2 muscles), [id number] (3+ muscles)- Dry Needling, Patient/Family education, Balance training, Stair training, Taping, Joint mobilization, Cryotherapy, and Moist heat  PLAN FOR NEXT SESSION: Consider ionto, ultrasound and taping for L medial knee pain. Strengthen hip flexors, adductors, abductors. Work on single limb strength/stability and weight shifting. High level balance with cognitive tasks.    Earnest Thalman,CHRIS, PTA 09/12/2024, 1:11 PM

## 2024-09-16 ENCOUNTER — Other Ambulatory Visit (HOSPITAL_BASED_OUTPATIENT_CLINIC_OR_DEPARTMENT_OTHER): Payer: Self-pay

## 2024-09-16 MED ORDER — EZETIMIBE 10 MG PO TABS
10.0000 mg | ORAL_TABLET | Freq: Every day | ORAL | 5 refills | Status: AC
  Filled 2024-09-16: qty 30, 30d supply, fill #0
  Filled 2024-10-07 – 2024-10-09 (×2): qty 30, 30d supply, fill #1

## 2024-09-17 ENCOUNTER — Other Ambulatory Visit (HOSPITAL_BASED_OUTPATIENT_CLINIC_OR_DEPARTMENT_OTHER): Payer: Self-pay

## 2024-09-17 MED ORDER — HYDROMORPHONE HCL 2 MG PO TABS
2.0000 mg | ORAL_TABLET | Freq: Four times a day (QID) | ORAL | 0 refills | Status: DC | PRN
  Filled 2024-10-07: qty 120, 30d supply, fill #0

## 2024-09-17 MED ORDER — XTAMPZA ER 13.5 MG PO C12A
13.5000 mg | EXTENDED_RELEASE_CAPSULE | Freq: Two times a day (BID) | ORAL | 0 refills | Status: DC
  Filled 2024-10-07: qty 40, 20d supply, fill #0
  Filled 2024-10-07: qty 20, 10d supply, fill #0

## 2024-10-02 ENCOUNTER — Other Ambulatory Visit (HOSPITAL_BASED_OUTPATIENT_CLINIC_OR_DEPARTMENT_OTHER): Payer: Self-pay

## 2024-10-07 ENCOUNTER — Other Ambulatory Visit: Payer: Self-pay

## 2024-10-07 ENCOUNTER — Other Ambulatory Visit (HOSPITAL_BASED_OUTPATIENT_CLINIC_OR_DEPARTMENT_OTHER): Payer: Self-pay

## 2024-10-07 MED ORDER — RANOLAZINE ER 500 MG PO TB12
500.0000 mg | ORAL_TABLET | Freq: Two times a day (BID) | ORAL | 2 refills | Status: AC
  Filled 2024-10-07: qty 180, 90d supply, fill #0

## 2024-10-08 ENCOUNTER — Other Ambulatory Visit (HOSPITAL_BASED_OUTPATIENT_CLINIC_OR_DEPARTMENT_OTHER): Payer: Self-pay

## 2024-10-08 ENCOUNTER — Other Ambulatory Visit (HOSPITAL_COMMUNITY): Payer: Self-pay | Admitting: Orthopedic Surgery

## 2024-10-08 ENCOUNTER — Ambulatory Visit (HOSPITAL_COMMUNITY)
Admission: RE | Admit: 2024-10-08 | Discharge: 2024-10-08 | Disposition: A | Discharge status: Home / Self Care | Source: Ambulatory Visit | Attending: Surgery | Admitting: Surgery

## 2024-10-08 DIAGNOSIS — M7989 Other specified soft tissue disorders: Secondary | ICD-10-CM | POA: Diagnosis not present

## 2024-10-08 DIAGNOSIS — M79605 Pain in left leg: Secondary | ICD-10-CM | POA: Insufficient documentation

## 2024-10-09 ENCOUNTER — Other Ambulatory Visit (HOSPITAL_BASED_OUTPATIENT_CLINIC_OR_DEPARTMENT_OTHER): Payer: Self-pay

## 2024-10-15 ENCOUNTER — Other Ambulatory Visit (HOSPITAL_BASED_OUTPATIENT_CLINIC_OR_DEPARTMENT_OTHER): Payer: Self-pay

## 2024-10-15 MED ORDER — XTAMPZA ER 13.5 MG PO C12A: 13.5000 mg | EXTENDED_RELEASE_CAPSULE | Freq: Two times a day (BID) | ORAL | 0 refills | Status: AC

## 2024-10-15 MED ORDER — HYDROMORPHONE HCL 2 MG PO TABS: 2.0000 mg | ORAL_TABLET | Freq: Four times a day (QID) | ORAL | 0 refills | Status: AC | PRN

## 2024-10-24 ENCOUNTER — Other Ambulatory Visit (HOSPITAL_BASED_OUTPATIENT_CLINIC_OR_DEPARTMENT_OTHER): Payer: Self-pay

## 2024-10-24 MED ORDER — PREDNISOLONE ACETATE 1 % OP SUSP
OPHTHALMIC | 0 refills | Status: AC
  Filled 2024-10-24: qty 5, 10d supply, fill #0

## 2024-10-25 ENCOUNTER — Other Ambulatory Visit (HOSPITAL_BASED_OUTPATIENT_CLINIC_OR_DEPARTMENT_OTHER): Payer: Self-pay

## 2024-10-25 MED ORDER — PANTOPRAZOLE SODIUM 40 MG PO TBEC
40.0000 mg | DELAYED_RELEASE_TABLET | Freq: Every day | ORAL | 3 refills | Status: AC
  Filled 2024-10-25: qty 90, 90d supply, fill #0

## 2024-10-25 MED ORDER — LEVOTHYROXINE SODIUM 100 MCG PO TABS
100.0000 ug | ORAL_TABLET | Freq: Every day | ORAL | 3 refills | Status: AC
  Filled 2024-10-25: qty 90, 90d supply, fill #0
